# Patient Record
Sex: Female | Born: 1960
Health system: Southern US, Community
[De-identification: ages and names within clinical notes are randomized; demographics above are authoritative.]

## PROBLEM LIST (undated history)

## (undated) DIAGNOSIS — E785 Hyperlipidemia, unspecified: Secondary | ICD-10-CM

## (undated) DIAGNOSIS — I7300 Raynaud's syndrome without gangrene: Secondary | ICD-10-CM

## (undated) DIAGNOSIS — I10 Essential (primary) hypertension: Secondary | ICD-10-CM

## (undated) DIAGNOSIS — M2550 Pain in unspecified joint: Secondary | ICD-10-CM

## (undated) DIAGNOSIS — T7840XA Allergy, unspecified, initial encounter: Secondary | ICD-10-CM

## (undated) DIAGNOSIS — H409 Unspecified glaucoma: Secondary | ICD-10-CM

## (undated) DIAGNOSIS — E739 Lactose intolerance, unspecified: Secondary | ICD-10-CM

## (undated) DIAGNOSIS — L659 Nonscarring hair loss, unspecified: Secondary | ICD-10-CM

## (undated) DIAGNOSIS — Z5189 Encounter for other specified aftercare: Secondary | ICD-10-CM

## (undated) DIAGNOSIS — E119 Type 2 diabetes mellitus without complications: Secondary | ICD-10-CM

## (undated) DIAGNOSIS — E669 Obesity, unspecified: Secondary | ICD-10-CM

## (undated) DIAGNOSIS — R251 Tremor, unspecified: Secondary | ICD-10-CM

## (undated) DIAGNOSIS — J45909 Unspecified asthma, uncomplicated: Secondary | ICD-10-CM

## (undated) DIAGNOSIS — R609 Edema, unspecified: Secondary | ICD-10-CM

## (undated) DIAGNOSIS — K219 Gastro-esophageal reflux disease without esophagitis: Secondary | ICD-10-CM

## (undated) DIAGNOSIS — E559 Vitamin D deficiency, unspecified: Secondary | ICD-10-CM

## (undated) DIAGNOSIS — M549 Dorsalgia, unspecified: Secondary | ICD-10-CM

## (undated) HISTORY — DX: Type 2 diabetes mellitus without complications: E11.9

## (undated) HISTORY — DX: Raynaud's syndrome without gangrene: I73.00

## (undated) HISTORY — DX: Encounter for other specified aftercare: Z51.89

## (undated) HISTORY — DX: Pain in unspecified joint: M25.50

## (undated) HISTORY — DX: Vitamin D deficiency, unspecified: E55.9

## (undated) HISTORY — PX: ABDOMINAL HYSTERECTOMY: SHX81

## (undated) HISTORY — DX: Edema, unspecified: R60.9

## (undated) HISTORY — DX: Tremor, unspecified: R25.1

## (undated) HISTORY — DX: Lactose intolerance, unspecified: E73.9

## (undated) HISTORY — DX: Essential (primary) hypertension: I10

## (undated) HISTORY — PX: POLYPECTOMY: SHX149

## (undated) HISTORY — DX: Allergy, unspecified, initial encounter: T78.40XA

## (undated) HISTORY — DX: Hyperlipidemia, unspecified: E78.5

## (undated) HISTORY — DX: Obesity, unspecified: E66.9

## (undated) HISTORY — DX: Gastro-esophageal reflux disease without esophagitis: K21.9

## (undated) HISTORY — DX: Nonscarring hair loss, unspecified: L65.9

## (undated) HISTORY — PX: BREAST BIOPSY: SHX20

## (undated) HISTORY — DX: Unspecified glaucoma: H40.9

## (undated) HISTORY — PX: MYOMECTOMY: SHX85

## (undated) HISTORY — DX: Dorsalgia, unspecified: M54.9

## (undated) HISTORY — DX: Unspecified asthma, uncomplicated: J45.909

## (undated) HISTORY — PX: COLONOSCOPY: SHX174

---

## 1998-04-30 ENCOUNTER — Encounter: Admission: RE | Admit: 1998-04-30 | Discharge: 1998-04-30 | Payer: Self-pay | Admitting: *Deleted

## 1998-04-30 ENCOUNTER — Encounter: Payer: Self-pay | Admitting: Family Medicine

## 1998-06-30 ENCOUNTER — Ambulatory Visit (HOSPITAL_COMMUNITY): Admission: RE | Admit: 1998-06-30 | Discharge: 1998-06-30 | Payer: Self-pay | Admitting: *Deleted

## 1998-06-30 ENCOUNTER — Encounter: Payer: Self-pay | Admitting: *Deleted

## 2000-04-05 ENCOUNTER — Other Ambulatory Visit: Admission: RE | Admit: 2000-04-05 | Discharge: 2000-04-05 | Payer: Self-pay | Admitting: Obstetrics and Gynecology

## 2000-04-21 ENCOUNTER — Encounter: Payer: Self-pay | Admitting: Obstetrics and Gynecology

## 2000-04-21 ENCOUNTER — Ambulatory Visit (HOSPITAL_COMMUNITY): Admission: RE | Admit: 2000-04-21 | Discharge: 2000-04-21 | Payer: Self-pay | Admitting: Obstetrics and Gynecology

## 2001-05-08 ENCOUNTER — Ambulatory Visit (HOSPITAL_COMMUNITY): Admission: RE | Admit: 2001-05-08 | Discharge: 2001-05-08 | Payer: Self-pay | Admitting: Obstetrics and Gynecology

## 2001-05-08 ENCOUNTER — Encounter: Payer: Self-pay | Admitting: Obstetrics and Gynecology

## 2001-06-13 ENCOUNTER — Other Ambulatory Visit: Admission: RE | Admit: 2001-06-13 | Discharge: 2001-06-13 | Payer: Self-pay | Admitting: Obstetrics and Gynecology

## 2002-02-22 ENCOUNTER — Encounter: Payer: Self-pay | Admitting: Family Medicine

## 2002-02-22 ENCOUNTER — Ambulatory Visit (HOSPITAL_COMMUNITY): Admission: RE | Admit: 2002-02-22 | Discharge: 2002-02-22 | Payer: Self-pay | Admitting: Family Medicine

## 2002-06-26 ENCOUNTER — Encounter: Payer: Self-pay | Admitting: Obstetrics and Gynecology

## 2002-06-26 ENCOUNTER — Ambulatory Visit (HOSPITAL_COMMUNITY): Admission: RE | Admit: 2002-06-26 | Discharge: 2002-06-26 | Payer: Self-pay | Admitting: Obstetrics and Gynecology

## 2002-08-15 ENCOUNTER — Other Ambulatory Visit: Admission: RE | Admit: 2002-08-15 | Discharge: 2002-08-15 | Payer: Self-pay | Admitting: Obstetrics and Gynecology

## 2003-07-09 ENCOUNTER — Ambulatory Visit (HOSPITAL_COMMUNITY): Admission: RE | Admit: 2003-07-09 | Discharge: 2003-07-09 | Payer: Self-pay | Admitting: Obstetrics and Gynecology

## 2003-08-19 ENCOUNTER — Other Ambulatory Visit: Admission: RE | Admit: 2003-08-19 | Discharge: 2003-08-19 | Payer: Self-pay | Admitting: Obstetrics and Gynecology

## 2004-07-13 ENCOUNTER — Ambulatory Visit (HOSPITAL_COMMUNITY): Admission: RE | Admit: 2004-07-13 | Discharge: 2004-07-13 | Payer: Self-pay | Admitting: Obstetrics and Gynecology

## 2004-08-24 ENCOUNTER — Other Ambulatory Visit: Admission: RE | Admit: 2004-08-24 | Discharge: 2004-08-24 | Payer: Self-pay | Admitting: Obstetrics and Gynecology

## 2005-02-21 ENCOUNTER — Other Ambulatory Visit: Admission: RE | Admit: 2005-02-21 | Discharge: 2005-02-21 | Payer: Self-pay | Admitting: Obstetrics and Gynecology

## 2005-07-11 ENCOUNTER — Ambulatory Visit (HOSPITAL_COMMUNITY): Admission: RE | Admit: 2005-07-11 | Discharge: 2005-07-11 | Payer: Self-pay | Admitting: Obstetrics and Gynecology

## 2005-08-11 ENCOUNTER — Other Ambulatory Visit: Admission: RE | Admit: 2005-08-11 | Discharge: 2005-08-11 | Payer: Self-pay | Admitting: Obstetrics and Gynecology

## 2008-06-23 ENCOUNTER — Other Ambulatory Visit: Admission: RE | Admit: 2008-06-23 | Discharge: 2008-06-23 | Payer: Self-pay | Admitting: Family Medicine

## 2008-06-26 ENCOUNTER — Ambulatory Visit (HOSPITAL_COMMUNITY): Admission: RE | Admit: 2008-06-26 | Discharge: 2008-06-26 | Payer: Self-pay | Admitting: Obstetrics and Gynecology

## 2009-06-01 ENCOUNTER — Encounter: Admission: RE | Admit: 2009-06-01 | Discharge: 2009-08-30 | Payer: Self-pay | Admitting: Internal Medicine

## 2009-07-09 ENCOUNTER — Ambulatory Visit (HOSPITAL_COMMUNITY): Admission: RE | Admit: 2009-07-09 | Discharge: 2009-07-09 | Payer: Self-pay | Admitting: Obstetrics and Gynecology

## 2009-09-09 ENCOUNTER — Inpatient Hospital Stay (HOSPITAL_COMMUNITY): Admission: RE | Admit: 2009-09-09 | Discharge: 2009-09-11 | Payer: Self-pay | Admitting: Obstetrics and Gynecology

## 2009-09-09 ENCOUNTER — Encounter (INDEPENDENT_AMBULATORY_CARE_PROVIDER_SITE_OTHER): Payer: Self-pay | Admitting: Obstetrics and Gynecology

## 2009-12-11 ENCOUNTER — Encounter (INDEPENDENT_AMBULATORY_CARE_PROVIDER_SITE_OTHER): Payer: Self-pay | Admitting: *Deleted

## 2010-01-01 ENCOUNTER — Encounter (INDEPENDENT_AMBULATORY_CARE_PROVIDER_SITE_OTHER): Payer: Self-pay | Admitting: *Deleted

## 2010-01-04 ENCOUNTER — Ambulatory Visit: Payer: Self-pay | Admitting: Gastroenterology

## 2010-01-18 ENCOUNTER — Ambulatory Visit: Payer: Self-pay | Admitting: Gastroenterology

## 2010-01-19 ENCOUNTER — Encounter: Payer: Self-pay | Admitting: Gastroenterology

## 2010-03-02 NOTE — Letter (Signed)
Summary: [facility] Instructions  [facility]  [address] [place], [place] [address]   Phone: [phone]  Fax: [phone]       KYRAN WHITTIER    Jun 16, 1960    MRN: [id number]        Procedure Day Dorna Bloom:  Duanne Limerick  01/18/10     Arrival Time:  7:30AM     Procedure Time:  8:30AM     Location of Procedure:                    _ X_  [facility] (4th Floor)                      PREPARATION FOR COLONOSCOPY WITH MOVIPREP   Starting 5 days prior to your procedure 01/13/10 do not eat nuts, seeds, popcorn, corn, beans, peas,  salads, or any raw vegetables.  Do not take any fiber supplements (e.g. Metamucil, Citrucel, and Benefiber).  THE DAY BEFORE YOUR PROCEDURE         DATE: 01/17/10   DAY: SUNDAY  1.  Drink clear liquids the entire day-NO SOLID FOOD  2.  Do not drink anything colored red or purple.  Avoid juices with pulp.  No orange juice.  3.  Drink at least 64 oz. (8 glasses) of fluid/clear liquids during the day to prevent dehydration and help the prep work efficiently.  CLEAR LIQUIDS INCLUDE: Water Jello Ice Popsicles Tea (sugar ok, no milk/cream) Powdered fruit flavored drinks Coffee (sugar ok, no milk/cream) Gatorade Juice: apple, white grape, white cranberry  Lemonade Clear bullion, consomm, broth Carbonated beverages (any kind) Strained chicken noodle soup Hard Candy                             4.  In the morning, mix first dose of MoviPrep solution:    Empty 1 Pouch A and 1 Pouch B into the disposable container    Add lukewarm drinking water to the top line of the container. Mix to dissolve    Refrigerate (mixed solution should be used within 24 hrs)  5.  Begin drinking the prep at 5:00 p.m. The MoviPrep container is divided by 4 marks.   Every 15 minutes drink the solution down to the next mark (approximately 8 oz) until the full liter is complete.   6.  Follow completed prep with 16 oz of clear liquid of your choice  (Nothing red or purple).  Continue to drink clear liquids until bedtime.  7.  Before going to bed, mix second dose of MoviPrep solution:    Empty 1 Pouch A and 1 Pouch B into the disposable container    Add lukewarm drinking water to the top line of the container. Mix to dissolve    Refrigerate  THE DAY OF YOUR PROCEDURE      DATE: 01/18/10   DAY: MONDAY  Beginning at 3:30AM (5 hours before procedure):         1. Every 15 minutes, drink the solution down to the next mark (approx 8 oz) until the full liter is complete.  2. Follow completed prep with 16 oz. of clear liquid of your choice.    3. You may drink clear liquids until 6:30AM (2 HOURS BEFORE PROCEDURE).   MEDICATION INSTRUCTIONS  Unless otherwise instructed, you should take regular prescription medications with a small sip of water   as early as possible the morning  of your procedure.        OTHER INSTRUCTIONS  You will need a responsible adult at least 51 years of age to accompany you and drive you home.   This person must remain in the waiting room during your procedure.  Wear loose fitting clothing that is easily removed.  Leave jewelry and other valuables at home.  However, you may wish to bring a book to read or  an iPod/MP3 player to listen to music as you wait for your procedure to start.  Remove all body piercing jewelry and leave at home.  Total time from sign-in until discharge is approximately 2-3 hours.  You should go home directly after your procedure and rest.  You can resume normal activities the  day after your procedure.  The day of your procedure you should not:   Drive   Make legal decisions   Operate machinery   Drink alcohol   Return to work  You will receive specific instructions about eating, activities and medications before you leave.    The above instructions have been reviewed and explained to me by   Clide Cliff, RN______________________    I fully understand  and can verbalize these instructions _____________________________ Date _________

## 2010-03-02 NOTE — Letter (Signed)
Summary: Pre Visit Letter Revised  [facility]  [address] [place], [place] [address]   Phone: [phone]  Fax: [phone]        12/11/2009 MRN: [id number]    Veronica Russell [address] [place], [place]  [address]             Procedure Date:  01/18/10  Welcome to the Gastroenterology Division at [facility].    You are scheduled to see a nurse for your pre-procedure visit on 01/04/10 at 8:30 a.m. on the 3rd floor at [facility], [address]  We ask that you try to arrive at our office 15 minutes prior to your appointment time to allow for check-in.  Please take a minute to review the attached form.  If you answer "Yes" to one or more of the questions on the first page, we ask that you call the person listed at your earliest opportunity.  If you answer "No" to all of the questions, please complete the rest of the form and bring it to your appointment.    Your nurse visit will consist of discussing your medical and surgical history, your immediate family medical history, and your medications.   If you are unable to list all of your medications on the form, please bring the medication bottles to your appointment and we will list them.  We will need to be aware of both prescribed and over the counter drugs.  We will need to know exact dosage information as well.    Please be prepared to read and sign documents such as consent forms, a financial agreement, and acknowledgement forms.  If necessary, and with your consent, a friend or relative is welcome to sit-in on the nurse visit with you.  Please bring your insurance card so that we may make a copy of it.  If your insurance requires a referral to see a specialist, please bring your referral form from your primary care physician.  No co-pay is required for this nurse visit.     If you cannot keep your appointment, please call [phone] to cancel or reschedule prior to your appointment date.   This allows [name] the opportunity to schedule an appointment for another patient in need of care.    Thank you for choosing [facility] for your medical needs.  We appreciate the opportunity to care for you.  Please visit [name] at our website  to learn more about our practice.  Sincerely, The Gastroenterology Division

## 2010-03-02 NOTE — Miscellaneous (Signed)
Summary: D. COLON - LSW.  Clinical Lists Changes  Medications: Added new medication of MOVIPREP 100 GM  SOLR (PEG-KCL-NACL-NASULF-NA ASC-C) As per prep instructions. - Signed Rx of MOVIPREP 100 GM  SOLR (PEG-KCL-NACL-NASULF-NA ASC-C) As per prep instructions.;  #1 x 0;  Signed;  Entered by: Clide Cliff RN;  Authorized by: Mardella Layman MD [facility];  Method used: Electronically to [facility]*, [address], [address] Shipping/mailing, [place], [place]  [address], Ph: [phone], Fax: [phone] Observations: Added new observation of ALLERGY REV: Done (01/04/2010 8:08)    Prescriptions: MOVIPREP 100 GM  SOLR (PEG-KCL-NACL-NASULF-NA ASC-C) As per prep instructions.  #1 x 0   Entered by:   Clide Cliff RN   Authorized by:   Mardella Layman MD [facility]   Signed by:   Clide Cliff RN on 01/04/2010   Method used:   Electronically to        [facility]* (retail)       [address]       [address] Shipping/mailing       [place], [place]  [address]       Ph: [phone]       Fax: [phone]   RxID:   [id number]

## 2010-03-04 NOTE — Letter (Signed)
Summary: Patient Notice- Polyp Results  [facility]  [address] [place], [place] [address]   Phone: [phone]  Fax: [phone]        January 19, 2010 MRN: [id number]    Veronica Russell [address] [place], [place]  [address]    Dear Ms. Rengel,  I am pleased to inform you that the colon polyp(s) removed during your recent colonoscopy was (were) found to be benign (no cancer detected) upon pathologic examination.  I recommend you have a repeat colonoscopy examination in 5_ years to look for recurrent polyps, as having colon polyps increases your risk for having recurrent polyps or even colon cancer in the future.  Should you develop new or worsening symptoms of abdominal pain, bowel habit changes or bleeding from the rectum or bowels, please schedule an evaluation with either your primary care physician or with me.  Additional information/recommendations:  _x_ No further action with gastroenterology is needed at this time. Please      follow-up with your primary care physician for your other healthcare      needs.  __ Please call [phone] to schedule a return visit to review your      situation.  __ Please keep your follow-up visit as already scheduled.  __ Continue treatment plan as outlined the day of your exam.  Please call us if you are having persistent problems or have questions about your condition that have not been fully answered at this time.  Sincerely,  Mardella Layman MD [facility]  This letter has been electronically signed by your physician.  Appended Document: Patient Notice- Polyp Results Letter Mailed

## 2010-03-04 NOTE — Procedures (Signed)
Summary: Colonoscopy  Patient: Veronica Russell Note: All result statuses are Final unless otherwise noted.  Tests: (1) Colonoscopy (COL)   COL Colonoscopy           DONE      [facility]     [address]     [place], [place]  [address]           COLONOSCOPY PROCEDURE REPORT           PATIENT:  Veronica Russell, Veronica Russell  MR#:  [id number]     BIRTHDATE:  [birth year], 49 yrs. old  GENDER:  female     ENDOSCOPIST:  Vania Rea. Jarold Motto, MD, [facility]     REF. BY:  Deatra James, M.D.     PROCEDURE DATE:  01/18/2010     PROCEDURE:  Average-risk screening colonoscopy     G0121     ASA CLASS:  Class II     INDICATIONS:  Routine Risk Screening     MEDICATIONS:   Fentanyl 100 mcg IV, Versed 10 mg IV           DESCRIPTION OF PROCEDURE:   After the risks benefits and     alternatives of the procedure were thoroughly explained, informed     consent was obtained.  Digital rectal exam was performed and     revealed no abnormalities.   The LB 180AL [id number] endoscope was     introduced through the anus and advanced to the cecum, which was     identified by both the appendix and ileocecal valve, without     limitations.  The quality of the prep was excellent, using     MoviPrep.  The instrument was then slowly withdrawn as the colon     was fully examined.     <<PROCEDUREIMAGES>>           FINDINGS:  Moderate diverticulosis was found throughout the colon.     DIFFUSE TICS,,,  A sessile polyp was found. 1CM SIGMOID POLYP HOT     SNARE EXCISED  This was otherwise a normal examination of the     colon.   Retroflexed views in the rectum revealed no     abnormalities.    The scope was then withdrawn from the patient     and the procedure completed.           COMPLICATIONS:  None     ENDOSCOPIC IMPRESSION:     1) Moderate diverticulosis throughout the colon     2) Sessile polyp     3) Otherwise normal examination     R/O ADENOMA.     RECOMMENDATIONS:     1) high fiber diet     2) metamucil or  benefiber     3) Repeat Colonoscopy in 5 years.     REPEAT EXAM:  No           ______________________________     Vania Rea. Jarold Motto, MD, [facility]           CC:           n.     eSIGNED:   Vania Rea. Patterson at 01/18/2010 09:25 AM           Cleatrice Burke, [id number]  Note: An exclamation mark (!) indicates a result that was not dispersed into the flowsheet. Document Creation Date: 01/18/2010 9:25 AM _______________________________________________________________________  (1) Order result status: Final Collection or observation date-time: 01/18/2010 09:16 Requested date-time:  Receipt date-time:  Reported date-time:  Referring Physician:   Ordering Physician: Sheryn Bison [id number]) Specimen Source:  Source: Launa Grill Order Number: [id number] Lab site:   Appended Document: Colonoscopy 5 year followup  Appended Document: Colonoscopy 5 yr follow up     Procedures Next Due Date:    Colonoscopy: 01/2015

## 2010-04-16 LAB — BASIC METABOLIC PANEL
Chloride: 102 mEq/L (ref 96–112)
Creatinine, Ser: 0.63 mg/dL (ref 0.4–1.2)
Creatinine, Ser: 0.78 mg/dL (ref 0.4–1.2)
Potassium: 3.5 mEq/L (ref 3.5–5.1)
Sodium: 136 mEq/L (ref 135–145)

## 2010-04-16 LAB — PREGNANCY, URINE: Preg Test, Ur: NEGATIVE

## 2010-04-16 LAB — CBC
Hemoglobin: 11.3 g/dL — ABNORMAL LOW (ref 12.0–15.0)
MCH: 29 pg (ref 26.0–34.0)
MCHC: 33 g/dL (ref 30.0–36.0)
Platelets: 290 10*3/uL (ref 150–400)
RBC: 3.89 MIL/uL (ref 3.87–5.11)
RBC: 4.68 MIL/uL (ref 3.87–5.11)

## 2010-04-16 LAB — GLUCOSE, CAPILLARY: Glucose-Capillary: 142 mg/dL — ABNORMAL HIGH (ref 70–99)

## 2010-04-16 LAB — SURGICAL PCR SCREEN: Staphylococcus aureus: NEGATIVE

## 2010-05-27 ENCOUNTER — Other Ambulatory Visit (HOSPITAL_COMMUNITY): Payer: Self-pay | Admitting: Obstetrics and Gynecology

## 2010-05-27 DIAGNOSIS — Z1231 Encounter for screening mammogram for malignant neoplasm of breast: Secondary | ICD-10-CM

## 2010-07-13 ENCOUNTER — Ambulatory Visit (HOSPITAL_COMMUNITY): Payer: 59

## 2010-07-22 ENCOUNTER — Ambulatory Visit (HOSPITAL_COMMUNITY): Payer: 59

## 2010-07-29 ENCOUNTER — Ambulatory Visit (HOSPITAL_COMMUNITY)
Admission: RE | Admit: 2010-07-29 | Discharge: 2010-07-29 | Disposition: A | Discharge status: Home / Self Care | Payer: 59 | Source: Ambulatory Visit | Attending: Obstetrics and Gynecology | Admitting: Obstetrics and Gynecology

## 2010-07-29 DIAGNOSIS — Z1231 Encounter for screening mammogram for malignant neoplasm of breast: Secondary | ICD-10-CM | POA: Insufficient documentation

## 2011-07-06 ENCOUNTER — Other Ambulatory Visit: Payer: Self-pay | Admitting: Obstetrics and Gynecology

## 2011-07-06 DIAGNOSIS — Z1231 Encounter for screening mammogram for malignant neoplasm of breast: Secondary | ICD-10-CM

## 2011-08-08 ENCOUNTER — Encounter: Payer: Self-pay | Admitting: *Deleted

## 2011-08-08 ENCOUNTER — Encounter: Payer: 59 | Attending: Family Medicine | Admitting: *Deleted

## 2011-08-08 DIAGNOSIS — Z713 Dietary counseling and surveillance: Secondary | ICD-10-CM | POA: Insufficient documentation

## 2011-08-08 DIAGNOSIS — E669 Obesity, unspecified: Secondary | ICD-10-CM | POA: Insufficient documentation

## 2011-08-08 NOTE — Progress Notes (Signed)
  Medical Nutrition Therapy:  Appt start time: 1500 end time:  1600.   Assessment:  Primary concerns today: obesity.   MEDICATIONS: see list   DIETARY INTAKE:  Usual eating pattern includes 2-3 meals and 1-3 snacks per day.  Everyday foods include fast food, fatty, high sodium meats, some vegetables.  Avoided foods include dairy.   Work 8-5 or 8-230 on weekends 24-hr recall:  B ( AM): skips most of the time.  May get sausage patty or egg, bacon biscuit.  Drinks mostly water, may have cranberry juice  Snk ( AM): may have 100 calorie pak L ( PM): hamburger or hot dog in cafeteria sometime gets fries.  Drinks coke or [employer] ( PM): may have oatmeal cookie sometimes D ( PM): takeout fast food almost always  Snk ( PM): none Beverages: some sugar-beverages, mostly water  Usual physical activity: ADL  Estimated energy needs: 1400 calories 158 g carbohydrates 105 g protein 39 g fat  Progress Towards Goal(s):  In progress.   Nutritional Diagnosis:  [place]-3.3 Overweight/obesity As related to energy-dense food and beverage choices as well as physical inactivity.  As evidenced by BMI of 43.    Intervention:  Nutrition counseling provided.  Patient is here for weight management.  She is prediabetic, hypertensive, and hyperlipidemic.  She wants to make some healthier changes, but feels her lifestyle isn't conducive to traditional weight loss strategies like Weight Watchers.  She is also confused about how to manage her three health issues (blood pressure, blood sugar, and cholesterol).  Discussed carb counting and portion control of carbohydrates, proteins, and fats. Encourages more whole grains, fresh fruits, and vegetables.  Encouraged lean, low -sodium meats.  Discouraged processed pork products.  Encouraged 3 meals a day and to avoid meal skipping.  Encouraged bringing breakfast and lunch from home as to avoid unhealthy cafeteria choices.  Also encouraged more physical activity and non-sugary  beverages.    Handouts given during visit include:  Meal plan card  Monitoring/Evaluation:  Dietary intake, exercise, and body weight in 6 week(s).

## 2011-08-08 NOTE — Patient Instructions (Addendum)
Goals:  Eat 3 meals/day, Avoid meal skipping   Increase protein rich foods  Follow "Plate Method" for portion control  Limit carbohydrate1-2 servings/meal   Choose more whole grains, lean protein, low-fat dairy, and fruits/non-starchy vegetables.   Aim for >30 min of physical activity daily  Limit sugar-sweetened beverages and concentrated sweets   

## 2011-08-10 ENCOUNTER — Ambulatory Visit (HOSPITAL_COMMUNITY)
Admission: RE | Admit: 2011-08-10 | Discharge: 2011-08-10 | Disposition: A | Discharge status: Home / Self Care | Payer: 59 | Source: Ambulatory Visit | Attending: Obstetrics and Gynecology | Admitting: Obstetrics and Gynecology

## 2011-08-10 DIAGNOSIS — Z1231 Encounter for screening mammogram for malignant neoplasm of breast: Secondary | ICD-10-CM | POA: Insufficient documentation

## 2011-09-19 ENCOUNTER — Encounter: Payer: 59 | Attending: Family Medicine | Admitting: *Deleted

## 2011-09-19 ENCOUNTER — Encounter: Payer: Self-pay | Admitting: *Deleted

## 2011-09-19 VITALS — Wt 250.0 lb

## 2011-09-19 DIAGNOSIS — Z713 Dietary counseling and surveillance: Secondary | ICD-10-CM | POA: Insufficient documentation

## 2011-09-19 DIAGNOSIS — E669 Obesity, unspecified: Secondary | ICD-10-CM | POA: Insufficient documentation

## 2011-09-19 NOTE — Progress Notes (Signed)
  Medical Nutrition Therapy:  Appt start time: 1730 end time:  1800.   Assessment:  Primary concerns today: obesity follow up.   MEDICATIONS: see list   DIETARY INTAKE:  Usual eating pattern includes 3 meals and 0-1 snacks per day.  Everyday foods include lately been eating out every night.  Avoided foods include none.    24-hr recall:  B ( AM): limits juice to 4 oz and has breakfast every day  Snk ( AM): none  L ( PM): chicken salad; sometimes eat Snk ( PM): none D ( PM): eats out almost every day because of bday celebration Snk ( PM): none Beverages: more water or diluted tea  Usual physical activity: none  Estimated energy needs:  1400 calories  158 g carbohydrates  105 g protein  39 g fat   Progress Towards Goal(s): Some progress.   Nutritional Diagnosis:  [place]-3.3 Overweight/obesity As related to energy-dense food and beverage choices as well as physical inactivity. As evidenced by BMI of 43.     Intervention:  Nutrition  Counseling provided.  Shirely has made some progress towards being aware of her food choices.  She is eating breakfast daily and is drinking less sugary beverages.  She tries to make healthier lunch choices in the cafeteria.  She has been going out to eat every night lately due to her birthday celebration and she hasn't had time to exercise.  She's trying to eat less sweets.  She says that once her birthday celebrations are over she can refocus on diet and exercise.  Encouraged making healthier choices when eating out and bringing half entree home, increasing physical activity, and always choosing non-calorie beverage.     Monitoring/Evaluation:  Dietary intake, exercise,  and body weight in 1 month(s).

## 2011-09-19 NOTE — Patient Instructions (Signed)
Goals:  Eat 3 meals/day, Avoid meal skipping   Increase protein rich foods  Follow "Plate Method" for portion control  Limit carbohydrate1-2 servings/meal   Choose more whole grains, lean protein, low-fat dairy, and fruits/non-starchy vegetables.   Aim for >30 min of physical activity daily  Limit sugar-sweetened beverages and concentrated sweets   

## 2011-10-24 ENCOUNTER — Ambulatory Visit: Payer: 59 | Admitting: *Deleted

## 2011-11-02 ENCOUNTER — Encounter: Payer: 59 | Attending: Family Medicine | Admitting: *Deleted

## 2011-11-02 VITALS — Ht 64.0 in | Wt 249.7 lb

## 2011-11-02 DIAGNOSIS — Z713 Dietary counseling and surveillance: Secondary | ICD-10-CM | POA: Insufficient documentation

## 2011-11-02 DIAGNOSIS — R7309 Other abnormal glucose: Secondary | ICD-10-CM

## 2011-11-02 DIAGNOSIS — E669 Obesity, unspecified: Secondary | ICD-10-CM | POA: Insufficient documentation

## 2011-11-02 DIAGNOSIS — E782 Mixed hyperlipidemia: Secondary | ICD-10-CM

## 2011-11-02 NOTE — Progress Notes (Signed)
  Medical Nutrition Therapy:  Appt start time: 1600 end time:  1630.   Assessment:  Primary concerns today: obesity follow up.   MEDICATIONS: metformin   DIETARY INTAKE:  Usual eating pattern includes 3 meals and 1 snacks per day.  Everyday foods include proteins, vegetables, starchez.  Avoided foods include none.    24-hr recall:  B ( AM): low fat cheese sticks, boiled eggs, strip of bacon sometimes; 4 oz juice  Snk ( AM): peanut butter crackers L ( PM): chicken salad or 1/2 dinner leftovers from restaurant Snk ( PM): none D ( PM): goes out to eat 4 nights. Steak or fish with vegetables or salad- opts out of starch; baked spaghetti with cheese, chicken pie (with low fat soup) uses low sodium chicken broth Snk ( PM): none Beverages: more water and gingerale  Usual physical activity: none  Estimated energy needs: 1400 calories 158 g carbohydrates 105 g protein 39 g fat  Progress Towards Goal(s):  Some progress. She eats 1/2 portion when eating out.  Chooses low carb choices when eating out.  Cheese low carb breakfasts most days and low carb snacks.  She eats more vegetables and has cut back on her sugary beverages   Nutritional Diagnosis:  [place]-3.3 Overweight/obesity As related to energy-dense food and beverage choices as well as physical inactivity. As evidenced by BMI of 43    Intervention:  Nutrition counseling provided.  Davey has made some changes.  Praised her for her changes.  Encouraged her to keep up the good work.  Suggested cutting out sugary drinks altogether- trying diet soda or tea with Splenda.  Suggested water aerobics classes and choosing low fat food preparation methods.  Reviewed portion sizes.   Handouts given during visit include:  My pocket portion guide  Monitoring/Evaluation:  Dietary intake, exercise, and body weight in 3 month(s).

## 2012-02-06 ENCOUNTER — Encounter: Payer: 59 | Attending: Family Medicine | Admitting: *Deleted

## 2012-02-06 VITALS — Ht 64.0 in | Wt 245.3 lb

## 2012-02-06 DIAGNOSIS — R7309 Other abnormal glucose: Secondary | ICD-10-CM

## 2012-02-06 DIAGNOSIS — E669 Obesity, unspecified: Secondary | ICD-10-CM | POA: Insufficient documentation

## 2012-02-06 DIAGNOSIS — Z713 Dietary counseling and surveillance: Secondary | ICD-10-CM | POA: Insufficient documentation

## 2012-02-06 DIAGNOSIS — E785 Hyperlipidemia, unspecified: Secondary | ICD-10-CM

## 2012-02-06 NOTE — Patient Instructions (Addendum)
Continue excellent work with food!!!!! Aim to increase physical activity Refer to book about treatment for hypoglycemia and hyperglycemia- carry candies Focus on alternative to food when dealing with stress and emotions: reading or watch movie, get pampered- nails done, etc.

## 2012-02-06 NOTE — Progress Notes (Signed)
  Medical Nutrition Therapy:  Appt start time: 1730 end time:  1800.   Assessment:  Primary concerns today: abnormal glucose; elevated blood lipids, obesity.   MEDICATIONS: see list   DIETARY INTAKE:  Usual eating pattern includes 3 meals and 0-1 snacks per day.  Everyday foods include leaner proteins, starches, vegetables.  Avoided foods include red meats, sweets.    24-hr recall:  B ( AM): grapes and pineapple maybe 2 strips bacon. water  Snk ( AM): none usually  L ( PM): rotiserie chicken and vegetable. 1/2 tea Snk ( PM): none usually.  Maybe peanut butter crackers D ( PM): fish or chicken and vegetables.  Smaller portions of starch Snk ( PM): none usuaally Beverages: water, 1/2 tea  Usual physical activity: none  Estimated energy needs: 1600 calories 180 g carbohydrates 120 g protein 44 g fat  Progress Towards Goal(s):  Some progress.   Nutritional Diagnosis:  [place]-3.3 Overweight/obesity As related to inadequate physical activity and history of large portions of energy-dense foods and beverages.  As evidenced by BMI and associated comorbidities.    Intervention:  Nutrition Veronica Russell is here for a follow up appointment.  She has lost about 5 pounds, which is terrific.  She reports eating smaller portions, eating out less, and eating leaner proteins.  She is not physically active.  Veronica Russell reported elevated glucose and a resulting increase in metformin.  She now takes 500 mg BID instead of 1 time qd.  She reports better control.  She's been exhibiting some symptoms of hypoglycemia and suggested she keep hard candies in her purse.  Discussed treatment for hypoglycemia.  There have been some emotional events for her lately; she's concerned about potentially overeating.  Discussed alternatives to food when managing stress and emotions.  She likes to read and watch movies.  Encouraged increasing physical activity.    Handouts given during visit include:  Living well with  diabetes  Monitoring/Evaluation:  Dietary intake, exercise, BGM, and body weight in 3 month(s).

## 2012-03-17 ENCOUNTER — Other Ambulatory Visit: Payer: Self-pay

## 2012-04-09 DIAGNOSIS — L658 Other specified nonscarring hair loss: Secondary | ICD-10-CM | POA: Insufficient documentation

## 2012-05-07 ENCOUNTER — Encounter: Payer: 59 | Attending: Family Medicine | Admitting: *Deleted

## 2012-05-07 VITALS — Ht 64.0 in | Wt 239.0 lb

## 2012-05-07 DIAGNOSIS — Z713 Dietary counseling and surveillance: Secondary | ICD-10-CM | POA: Insufficient documentation

## 2012-05-07 DIAGNOSIS — E669 Obesity, unspecified: Secondary | ICD-10-CM

## 2012-05-07 DIAGNOSIS — E119 Type 2 diabetes mellitus without complications: Secondary | ICD-10-CM

## 2012-05-07 NOTE — Progress Notes (Signed)
  Medical Nutrition Therapy:  Appt start time: 1700 end time:  1730.   Assessment:  Primary concerns today: Veronica Russell is here for a follow up appointment for her diabetes.  She has lost about 11 pounds and her HbA1c dropped to 6.4%! She has made excellent progress so far.  She reports having multiple URIs and has been on antibiotics on and off for several months.  During her sickness, she had symptoms of hypoglycemia and reported knowing how to treat those symptoms based off of her visits to [facility].  She is limiting her portions, especially of carbohydrates, and she's having to park farther away now so she's getting more exercise.   MEDICATIONS: see list   DIETARY INTAKE:  Usual eating pattern includes 3 meals and 0-1 snacks per day.   Everyday foods include leaner proteins, starches, vegetables. Avoided foods include red meats, sweets   Usual physical activity: parks farther away.  Pedometer reveals about 5000 steps/day  Estimated energy needs:  1600 calories  180 g carbohydrates  120 g protein  44 g fat   Progress Towards Goal(s): Some progress.   Nutritional Diagnosis:  [place]-3.3 Overweight/obesity As related to inadequate physical activity and history of large portions of energy-dense foods and beverages. As evidenced by BMI and associated comorbidities.    Intervention:  Nutrition counseling provided.  Praised Veronica Russell for her successes!  Discussed hypoglycemia and sick day rules.  Recommended simple carbohydrates like jello, ginger ale, saltines, etc when she is sick and doesn't feel like eating much.  Encouraged her to check her glucose more often when sick due to potential hyperglycemia risk. Recommended increasing activity as possible.    Monitoring/Evaluation:  Dietary intake, exercise, BGM, and body weight in 3 month(s).

## 2012-07-17 ENCOUNTER — Other Ambulatory Visit: Payer: Self-pay | Admitting: Obstetrics and Gynecology

## 2012-07-17 DIAGNOSIS — Z1231 Encounter for screening mammogram for malignant neoplasm of breast: Secondary | ICD-10-CM

## 2012-08-06 ENCOUNTER — Encounter: Payer: Self-pay | Admitting: *Deleted

## 2012-08-06 ENCOUNTER — Encounter: Payer: 59 | Attending: Family Medicine | Admitting: *Deleted

## 2012-08-06 VITALS — Ht 64.0 in | Wt 242.3 lb

## 2012-08-06 DIAGNOSIS — E119 Type 2 diabetes mellitus without complications: Secondary | ICD-10-CM

## 2012-08-06 DIAGNOSIS — Z713 Dietary counseling and surveillance: Secondary | ICD-10-CM | POA: Insufficient documentation

## 2012-08-06 DIAGNOSIS — E669 Obesity, unspecified: Secondary | ICD-10-CM | POA: Insufficient documentation

## 2012-08-06 NOTE — Progress Notes (Signed)
  Medical Nutrition Therapy:  Appt start time: 1700 end time:  1730.  Assessment:  Primary concerns today: Veronica Russell is here for a follow up appointment for her diabetes.  She has made excellent progress so far.   She is limiting her portions, especially of carbohydrates, and she's having to park farther away now so she's getting more exercise.  She uses a pedometer and walks at least 5000 steps a day. Breakfast continues to be a challenge, but she usually eats something.  Her friends and family have been pressuring her to drink meal replacement shakes, but she didn't think they would be healthy for her and hasn't had any yet..  She eats a lot of fish and chicken.  She still continues to eat out most nights, but all the staff at the restaurants know to sub veggies for the starches and to box up half her food for her.  She's concerned about going to the beach next week and how she will continue to stay healthy while traveling.  Her metformin XR continues to make her feel nauseated so she takes it right before bed  MEDICATIONS: see list   DIETARY INTAKE:  Usual eating pattern includes 3 meals and 0-1 snacks per day.   Everyday foods include leaner proteins, starches, vegetables. Avoided foods include red meats, sweets   Usual physical activity: parks farther away.  Pedometer reveals about 5000 steps/day  Estimated energy needs:  1600 calories  180 g carbohydrates  120 g protein  44 g fat   Progress Towards Goal(s): Some progress.   Nutritional Diagnosis:  [place]-3.3 Overweight/obesity As related to inadequate physical activity and history of large portions of energy-dense foods and beverages. As evidenced by BMI and associated comorbidities.    Intervention:  Nutrition counseling provided.  Praised Nihira for her successes! Discussed healthy breakfast options and recommended protein with her fruit.  Recommended going to the fitness center at [facility].  Recommended checking her glucose after meals  as well as before just to make sure her post prandial glucose is WNL.  Recommended packing a cooler with healthy snacks when she goes to the beach/pool so that she can keep her glucose levels normal.  Discouraged shake use and applauded her for not using meal replacement shakes    Monitoring/Evaluation:  Dietary intake, exercise, BGM, and body weight in 3 month(s).

## 2012-08-21 ENCOUNTER — Ambulatory Visit (HOSPITAL_COMMUNITY)
Admission: RE | Admit: 2012-08-21 | Discharge: 2012-08-21 | Disposition: A | Discharge status: Home / Self Care | Payer: 59 | Source: Ambulatory Visit | Attending: Obstetrics and Gynecology | Admitting: Obstetrics and Gynecology

## 2012-08-21 DIAGNOSIS — Z1231 Encounter for screening mammogram for malignant neoplasm of breast: Secondary | ICD-10-CM | POA: Insufficient documentation

## 2012-12-06 ENCOUNTER — Other Ambulatory Visit: Payer: Self-pay

## 2013-07-31 ENCOUNTER — Other Ambulatory Visit (HOSPITAL_COMMUNITY): Payer: Self-pay | Admitting: Family Medicine

## 2013-07-31 DIAGNOSIS — Z1231 Encounter for screening mammogram for malignant neoplasm of breast: Secondary | ICD-10-CM

## 2013-08-27 ENCOUNTER — Ambulatory Visit (HOSPITAL_COMMUNITY)
Admission: RE | Admit: 2013-08-27 | Discharge: 2013-08-27 | Disposition: A | Discharge status: Home / Self Care | Payer: 59 | Source: Ambulatory Visit | Attending: Family Medicine | Admitting: Family Medicine

## 2013-08-27 DIAGNOSIS — Z1231 Encounter for screening mammogram for malignant neoplasm of breast: Secondary | ICD-10-CM | POA: Insufficient documentation

## 2013-12-24 ENCOUNTER — Encounter: Payer: 59 | Attending: Family Medicine | Admitting: *Deleted

## 2013-12-24 DIAGNOSIS — Z713 Dietary counseling and surveillance: Secondary | ICD-10-CM | POA: Diagnosis not present

## 2013-12-24 DIAGNOSIS — E119 Type 2 diabetes mellitus without complications: Secondary | ICD-10-CM | POA: Diagnosis present

## 2013-12-24 NOTE — Patient Instructions (Addendum)
Check glucose before before bed.  Goal is less than 140 If you are high before bed and high in morning, then you ate too much carb or fat If you are normal at bedtime, then you're going low at night and we need to adjust medication  Consider membership to [facility] for water aerobics Aim for 48 oz water/day  Aim to increase vegetables: beets, green beans, spinach Increase amount at breakfast

## 2013-12-24 NOTE — Progress Notes (Signed)
Appointment start time: 1730  Appointment end time: 1800  Assessment: Veronica Russell is here for follow up nutrition counseling pertaining to diabetes.  I have not seen her since July 2014.  She states she needs a little check-up and accountability.  Fasting glucose has been a little higher than her normal:  It's been closer to 130 mg/dl.  Her postprandial glucose is always WNL.  Had urinalysis recently and has proteinuria.  Complains of heartburn and doesn't want to take anything over the counter   24 hour recall: B: 2 strips bacon and some eggs and pecans L: [place] and stuffing D: [employer] fried chicken strips and fries and cookie S: raspberry sorbet Beverages: water, small cup soda, 1/2 tea  Exercise: 5000 steps/day per phone app.  Has been struggling with tendonitis, but she's ok now. Has been told to do water exercises, not land exercise  Nutrition Diagnosis: Impaired nutrient utilization as related to carbohydrates.  As evidenced by diabetes.    Intervention:  Reviewed MyPlate recommendations for meal planning with diabetes: increase non-starchy vegetables and still have adequate protein and carbohydrates.  Recommended increasing physical activity through water aerobics.  Discussed possible reasons for high morning glucose readings: reactive hyperglycemia during the night after falling glucose, or excessive fat or carb consumption at night.  Instructed her to monitor her glucose before bed to better determine cause.  Goals: Check glucose before before bed.  Goal is less than 140 If you are high before bed and high in morning, then you ate too much carb or fat If you are normal at bedtime, then you're going low at night and we need to adjust medication  Consider membership to [facility] for water aerobics Aim for 48 oz water/day  Aim to increase vegetables: beets, green beans, spinach Increase amount at breakfast  Handouts given: Myplate for diabetes Meal plan card  Monitoring: patient will  follow up in 2 months

## 2014-02-20 ENCOUNTER — Telehealth: Payer: 59 | Admitting: Physician Assistant

## 2014-02-20 DIAGNOSIS — B349 Viral infection, unspecified: Secondary | ICD-10-CM

## 2014-02-20 DIAGNOSIS — R0602 Shortness of breath: Secondary | ICD-10-CM

## 2014-02-20 NOTE — Progress Notes (Signed)
Based on what you shared with me it looks like you have a serious condition that should be evaluated in a face to face office visit.  You have some symptoms that most likely represent a viral illness, not necessarily the flu, as it is usually associated with fever, chills and aches.  Your symptoms seem more consistent with a viral sinusitis.  Make sure to stay well-hydrated.  Since you are endorsing shortness of breath, our protocol is to advise you to be seen at one of the facilities below as you need a physical examination to assess lung status and oxygen saturation.  If you are having a true medical emergency please call 911.  If you need an urgent face to face visit, [facility] has four urgent care centers for your convenience.  . [facility] a Provider at this Location  [address] [place], [place] [address] . 8 am to 8 pm Monday-Friday . 9 am to 7 pm Saturday-Sunday  . [facility] [place] a Provider at this Location  [place] [place], [place], [place] [address] . 8 am to 8 pm Monday-Friday . 9 am to 6 pm Saturday . 11 am to 6 pm Sunday   . [facility] [place] Get Driving Directions  [address], [place] [address] . 8 am to 8 pm Monday-Friday . 9 am to 4 pm Saturday-Sunday   . [facility] (a walk in primary care provider)  [place] a Provider at this Location  [place], [place] [address] . 8 am to 8:30 pm Monday-Thursday . 8 am to 6 pm Friday . 8 am to 4 pm Saturday-Sunday   Your e-visit answers were reviewed by a board certified advanced clinical practitioner to complete your personal care plan.  Depending on the condition, your plan could have included both over the counter or prescription  medications.  You will get an e-mail in the next two days asking about your experience.  I hope that your e-visit has been valuable and will speed your recovery . Thank you for choosing an e-visit.

## 2014-06-17 ENCOUNTER — Encounter: Payer: Self-pay | Admitting: *Deleted

## 2014-06-26 ENCOUNTER — Ambulatory Visit: Payer: 59 | Admitting: *Deleted

## 2014-07-02 ENCOUNTER — Other Ambulatory Visit: Payer: Self-pay | Admitting: *Deleted

## 2014-07-03 ENCOUNTER — Encounter: Payer: Self-pay | Admitting: *Deleted

## 2014-07-04 ENCOUNTER — Other Ambulatory Visit (HOSPITAL_COMMUNITY): Payer: Self-pay | Admitting: Family Medicine

## 2014-07-04 DIAGNOSIS — Z1239 Encounter for other screening for malignant neoplasm of breast: Secondary | ICD-10-CM

## 2014-07-04 NOTE — Patient Outreach (Signed)
[facility]) Care Management   07/02/14  Veronica Russell 1960/06/06 [id number]  Veronica Russell is an 54 y.o. female who presents for routine Link To Wellness follow up for self management assistance with Type 2 DM, HTN and hyperlipidemia.  Subjective:  Veronica Russell states she was accepted into the 12 week [employer] Digital Care Study for people with Type II DM and she is very pleased with the study. She says she checks her blood sugars twice daily and the numbers are sent digitally to the support staff. She reports no episodes of hypoglycemia and states her hypoglycemia threshold is a blood sugar of <70. She says in addition to receiving a free digital scale, glucometer and pulse oximetry and fitness tracker, she is also receiving her meals and snacks at no cost from [employer]. She says the food is very helpful in assisting her with adherence to a CHO controlled meal plan. She says she has lost about 5 lbs and feels more energetic. Says she saw Dr. Radene Ou on 03/12/14 and will return on 09/15/14 for her annual wellness visit. She is voicing concern that her Mom's multiple myeloma may have returned because some recent blood work came back abnormal. Says she will take her Mom to see her oncologist on 6/9.  Objective:   Review of Systems  Constitutional: Negative.     Physical Exam  Constitutional: She is oriented to person, place, and time. She appears well-developed and well-nourished.  Neurological: She is alert and oriented to person, place, and time.  Skin: Skin is warm and dry.  Psychiatric: She has a normal mood and affect. Her behavior is normal. Judgment and thought content normal.   Filed Weights   07/02/14 1701  Weight: 248 lb 6.4 oz (112.674 kg)   Filed Vitals:   07/02/14 1701  BP: 104/82    Current Medications:   Current Outpatient Prescriptions  Medication Sig Dispense Refill  . amLODipine (NORVASC) 5 MG tablet Take 5 mg by mouth daily.    Marland Kitchen  atorvastatin (LIPITOR) 20 MG tablet Take 20 mg by mouth daily.    Marland Kitchen lisinopril-hydrochlorothiazide (PRINZIDE,ZESTORETIC) 20-12.5 MG per tablet Take 1 tablet by mouth 2 (two) times daily.     . metFORMIN (GLUCOPHAGE-XR) 500 MG 24 hr tablet Take 500 mg by mouth 2 (two) times daily.    . propranolol (INDERAL) 20 MG tablet Take 20 mg by mouth 2 (two) times daily.     . Vitamin D, Ergocalciferol, (DRISDOL) 50000 UNITS CAPS Take 50,000 Units by mouth.     No current facility-administered medications for this visit.    Functional Status:   In your present state of health, do you have any difficulty performing the following activities: 07/02/2014  Hearing? N  Vision? N  Difficulty concentrating or making decisions? N  Walking or climbing stairs? N  Dressing or bathing? N  Doing errands, shopping? N    Fall/Depression Screening:    PHQ 2/9 Scores 07/02/2014  PHQ - 2 Score 0   THN CM Care Plan Problem One        Patient Outreach from 07/02/2014 in [place] Problem One  Good control of Type II DM, HTN and Hyperlipidemia as evidenced by meeting treatment targets for A1C, BP and lipid profile prameters   Care Plan for Problem One  Active   THN Long Term Goal (31-90 days)  Continued good control of Type II DM, HTN and Hyperlipidemia as evidenced by meeting  treatment targets of  A1C<7.0%, BP<140/<90 and normal lipid profile at next check   [facility] Long Term Goal Start Date  07/02/14   Interventions for Problem One Long Term Goal  Reviewed stategies for ongoing good control of DM, HTN and hyperlipidemia by continued participation in the [employer] care initiative for people with Type II DM which includes adherence to meal plan by consuming only food provided by the study and blood glucose and weight monitoring per study protocol,  reviewed upcoming appointments for mammogram, eye exam adn reinforced the importance of keeping the appointments, arranged for Link To Wellness follow  up in September     Assessment:   Link To Wellness member with good control of Type II DM, HTN, and hyperlipidemia as evidenced by meeting all disease state treatment targets  Plan:  RNCM to fax today's office visit note to Dr. Radene Ou. RNCM will meet quarterly and as needed with patient per Link To Wellness program guidelines to assist with chronic disease self-management and assess patient's progress toward mutually set goals.  Barrington Ellison RN,CCM,CDE [place] Management Coordinator Link To Wellness Office Phone [phone] Office Fax [phone]

## 2014-09-02 ENCOUNTER — Ambulatory Visit (HOSPITAL_COMMUNITY)
Admission: RE | Admit: 2014-09-02 | Discharge: 2014-09-02 | Disposition: A | Discharge status: Home / Self Care | Payer: 59 | Source: Ambulatory Visit | Attending: Family Medicine | Admitting: Family Medicine

## 2014-09-02 DIAGNOSIS — Z1231 Encounter for screening mammogram for malignant neoplasm of breast: Secondary | ICD-10-CM | POA: Insufficient documentation

## 2014-09-02 DIAGNOSIS — Z1239 Encounter for other screening for malignant neoplasm of breast: Secondary | ICD-10-CM

## 2014-10-08 ENCOUNTER — Other Ambulatory Visit: Payer: Self-pay | Admitting: *Deleted

## 2014-10-13 NOTE — Patient Outreach (Signed)
[facility]) Care Management   10/13/2014  Veronica Russell 06-26-1960 [id number]  Veronica Russell is an 54 y.o. female who presents to the [facility] CM office to enroll in the Link To Wellness program for self management assistance with Type II DM   Subjective:  Veronica Russell says she saw Dr. Radene Ou on 09/15/14 and received a good report. She says her BP, lipid profile, and CMET were normal and her A1C was 6.5%. She says she will return to see Dr. Radene Ou In February. Veronica Russell says she is now paying privately to work out with a Physiological scientist, Veronica Russell, for 30 minutes twice weekly. She says is has been harder to follow a CHO controlled meal plan now that the [facility] study is over and she no longer receives the food through the program. She says she is attending the Weigh To Wellness classes taught by Dr. Boyce Medici at [facility] at no cost as a nutritional counseling benefit under the [place]. She says she picked up a True Metrix meter from the outpatient pharmacy since she can no longer get the strips for the meter she was using during the [employer] study. She says her Mom is doing well and remains in remission.   Objective:   Review of Systems  Constitutional: Negative.     Physical Exam  Constitutional: She is oriented to person, place, and time. She appears well-developed and well-nourished.  Neurological: She is alert and oriented to person, place, and time.  Skin: Skin is warm and dry.  Psychiatric: She has a normal mood and affect. Her behavior is normal. Judgment and thought content normal.   Filed Vitals:   10/08/14 1720  BP: 102/78   Filed Weights   10/08/14 1720  Weight: 252 lb (114.306 kg)    Current Medications:   Current Outpatient Prescriptions  Medication Sig Dispense Refill  . amLODipine (NORVASC) 5 MG tablet Take 5 mg by mouth daily.    Marland Kitchen atorvastatin (LIPITOR) 20 MG tablet Take 20 mg by mouth daily.    Marland Kitchen  lisinopril-hydrochlorothiazide (PRINZIDE,ZESTORETIC) 20-12.5 MG per tablet Take 1 tablet by mouth 2 (two) times daily.     . Melatonin 3 MG TABS Take 1 tablet by mouth at bedtime as needed.    . metFORMIN (GLUCOPHAGE-XR) 500 MG 24 hr tablet Take 500 mg by mouth 2 (two) times daily.    . propranolol (INDERAL) 20 MG tablet Take 20 mg by mouth 2 (two) times daily.     . Vitamin D, Ergocalciferol, (DRISDOL) 50000 UNITS CAPS Take 50,000 Units by mouth.      Assessment:   [employer] [place] employee and Link To Wellness member with ongoing good control of Type II DM, HTN and hyperlipidemia as evidenced by   Plan:  THN CM Care Plan Problem One        Most Recent Value   Care Plan Problem One  Good control of Type II DM, HTN and Hyperlipidemia as evidenced by meeting treatment targets for A1C, BP and lipid profile parameters    Role Documenting the Problem One  Care Management Coordinator   Care Plan for Problem One  Active   THN Long Term Goal (31-90 days)  Continued good control of Type II DM, HTN and Hyperlipidemia as evidenced by meeting treatment targets of  A1C<7.0%, BP<140/<90 and normal lipid profile at next check   [facility] Long Term Goal Start Date  07/02/14   Interventions for Problem One Long Term Goal  Reviewed the mechanism of action of Metformin, congratulated Veronica Russell on continuing an exercise program by meeting with a personal trainer for 30 minutes 2 x weekly and reviewed how exercise increases insulin sensitivity, also congratulated her for enrolling in the Weight To Wellness class series, reviewed strategies for ongoing good control of DM, HTN and hyperlipidemia including consistent medication adherence and adherence to a CHO controlled meal plan, continuing to monitor CBG daily and as needed, and keeping follow up appointments with primary and speciality providers, arranged for Link To Wellness follow up in December     This RNCM will send today's office visit note to patient's primary care  provider. This RNCM will continue to meet with patient at least quarterly and as needed per Link To Wellness program guidelines to assist with Type II DM self management and assess patient's progress toward mutually set goals.   Barrington Ellison RN,CCM,CDE [place] Management Coordinator Link To Wellness Office Phone [phone] Office Fax [phone]

## 2014-12-22 ENCOUNTER — Ambulatory Visit: Payer: 59 | Attending: Physician Assistant | Admitting: Physical Therapy

## 2014-12-22 DIAGNOSIS — R269 Unspecified abnormalities of gait and mobility: Secondary | ICD-10-CM | POA: Diagnosis present

## 2014-12-22 DIAGNOSIS — R29898 Other symptoms and signs involving the musculoskeletal system: Secondary | ICD-10-CM | POA: Insufficient documentation

## 2014-12-22 DIAGNOSIS — M25561 Pain in right knee: Secondary | ICD-10-CM | POA: Diagnosis present

## 2014-12-22 DIAGNOSIS — M25562 Pain in left knee: Secondary | ICD-10-CM | POA: Diagnosis present

## 2014-12-22 NOTE — Patient Instructions (Signed)
   Rustyn Conery PT, DPT, LAT, ATC  [facility] Phone: [phone]     

## 2014-12-22 NOTE — Therapy (Signed)
[place], [place], [address] Phone: [phone]   Fax:  [phone]  Physical Therapy Evaluation  Patient Details  Name: Veronica Russell MRN: [id number] Date of Birth: [birth year] Referring Provider: Narda Amber PA-C  Encounter Date: 12/22/2014      PT End of Session - 12/22/14 1709    Visit Number 1   Number of Visits 12   Date for PT Re-Evaluation 02/02/15   PT Start Time [id number]   PT Stop Time 1630   PT Time Calculation (min) 45 min   Activity Tolerance Patient tolerated treatment well   Behavior During Therapy [facility] for tasks assessed/performed      Past Medical History  Diagnosis Date  . Hyperlipidemia   . Hypertension   . Obesity     Past Surgical History  Procedure Laterality Date  . Abdominal hysterectomy    . Myomectomy      There were no vitals filed for this visit.  Visit Diagnosis:  Bilateral knee pain - Plan: PT plan of care cert/re-cert  Abnormality of gait - Plan: PT plan of care cert/re-cert  Left leg weakness - Plan: PT plan of care cert/re-cert      Subjective Assessment - 12/22/14 1553    Subjective pt is a 54 y.o F with CC of bil knee pain with L>R that has been getting worse since June with non-traumatic onset. pt reports it started when she started at gym and has a Physiological scientist and she has gradually gotten worse since it started. She states the pain is in the hips and knees.    How long can you sit comfortably? unlimited   How long can you stand comfortably? 1 hour   How long can you walk comfortably? 1 hour   Diagnostic tests x-ray last week, per pt report the R knee has a patellar tilt   Patient Stated Goals to be pain free   Currently in Pain? Yes   Pain Score 0-No pain  with walking/standing 5/1   Pain Location Knee   Pain Orientation Right;Left  L>R   Pain Descriptors / Indicators Burning;Sharp   Pain Type Chronic pain   Pain Onset More than a month  ago   Pain Frequency Intermittent   Aggravating Factors  stairs, prolonged standing/ walking, bending the knee   Pain Relieving Factors spray linoment on it, ice,             [facility] PT Assessment - 12/22/14 1600    Assessment   Medical Diagnosis bil knee chondromalacia   Referring Provider Narda Amber PA-C   Onset Date/Surgical Date --  June 2016   Hand Dominance Right   Next MD Visit 01/27/2015   Prior Therapy no   Precautions   Precautions Other (comment)   Precaution Comments stop going to personal trainer   Restrictions   Weight Bearing Restrictions No   Balance Screen   Has the patient fallen in the past 6 months No   Has the patient had a decrease in activity level because of a fear of falling?  No   Is the patient reluctant to leave their home because of a fear of falling?  No   Home Environment   Living Environment Private residence   Living Arrangements Alone   Type of [place] Access Level entry   Home Layout One level   Prior Function   Level of Independence Independent;Independent with basic ADLs   Vocation  Full time employment  surgery scheduling   Vocation Requirements prolonged sitting   Leisure watching TV   Cognition   Overall Cognitive Status Within Functional Limits for tasks assessed   Observation/Other Assessments   Focus on Therapeutic Outcomes (FOTO)  45% limited  Predicated 50% limite   Posture/Postural Control   Posture/Postural Control Postural limitations   Postural Limitations Rounded Shoulders;Forward head   ROM / Strength   AROM / PROM / Strength AROM;PROM;Strength   AROM   AROM Assessment Site Knee   Right/Left Knee Right;Left   Right Knee Extension 0   Right Knee Flexion 128   Left Knee Extension -4   Left Knee Flexion 123   PROM   PROM Assessment Site Knee   Right/Left Knee Right;Left   Left Knee Extension -2   Left Knee Flexion 135  pain at endrange of motion   Strength   Strength Assessment Site Knee;Hip    Right/Left Hip Right;Left   Right Hip Flexion 4/5   Right Hip Extension 3+/5   Right Hip ABduction 4-/5   Right Hip ADduction 5/5   Left Hip Flexion 4+/5   Left Hip Extension 3+/5   Left Hip ABduction 3+/5   Left Hip ADduction 5/5   Right/Left Knee Right;Left   Right Knee Flexion 4+/5   Right Knee Extension 4+/5   Left Knee Flexion 5/5   Left Knee Extension 4/5  pain during testing   Palpation   Patella mobility hypomobility bil with L>R with increased tightness toward the medial aspect   Palpation comment tendnerness locatred in the medial and lateral apsect of the L patella, with bil patellar tilts    Special Tests    Special Tests Knee Special Tests   Knee Special tests  Lateral Pull Sign;Step-up/Step Down Test;Patellofemoral Grind Test (Clarke's Sign);Patellofemoral Apprehension Test   Lateral Pull Sign    Findings Positive   Side Left   Patellofemoral Apprehension Test    Findings Positive   Side  Left   Patellofemoral Grind test (Clark's Sign)   Findings Postive   Side  --  bil   Ambulation/Gait   Gait Pattern Step-through pattern;Decreased stride length;Antalgic;Decreased trunk rotation                   [facility] Adult PT Treatment/Exercise - 12/22/14 1600    Manual Therapy   Manual Therapy Taping   McConnell Lateral to medial  pt reported relief of pain                PT Education - 12/22/14 1709    Education provided Yes   Education Details evaluation findings, POC, goals, HEP   Person(s) Educated Patient   Methods Explanation   Comprehension Verbalized understanding          PT Short Term Goals - 12/22/14 1716    PT SHORT TERM GOAL #1   Title pt will be I with inital HEP ( 01/12/2015)   Time 3   Period Weeks   Status New   PT SHORT TERM GOAL #2   Title pt will be able to verbalize and demonstrate techniques to contro bil knee pain and swelling via RICE, and HEP ( 01/12/2015)   Time 6   Period Weeks   Status New            PT Long Term Goals - 12/22/14 1717    PT LONG TERM GOAL #1   Title pt will be I with all HEP as of last  visit ( 02/02/2015)   Time 6   Period Weeks   Status New   PT LONG TERM GOAL #2   Title pt will increase R knee extension to >/=4+/5 with </= 3/10 pain to assist with prolonged standing/ walking (02/02/2015)   Time 6   Period Weeks   Status New   PT LONG TERM GOAL #3   Title pt will increase her L abduction and bil hip extension strength to >/=4/5 to assist with decreased medial collapse of the knee during step downs (02/02/2015)    Time 6   Period Weeks   Status New   PT LONG TERM GOAL #4   Title pt will be able to navigate >/=10 stairs with </= 3/10 pain to asisst with ADLs and community ambulation (02/02/2015)   Time 6   Period Weeks   Status New   PT LONG TERM GOAL #5   Title pt will increase her FOTO score to >/= 50 to demonstrate improved function at discharge (02/02/2015)   Time [place] - 12/22/14 1709    Clinical Impression Statement Veronica Russell presents to OPPT with CC of bil knee pain with L>R. she exhibits milld AROM limitation compared bil. MMT for the L knee was 4-/5 for extension with pain during testing, and 4+/5 with knee flexion. Hip strength was [facility] compared bil except for L abduction at 3+/5 during testing.  palpation revealed bil patellar tilt with the L>R and pain in the medial and lateral components of the L patella as well as tightness of the Vastus lateralis. . She demonstrated postivie apphrehension, step down testing with pain and medial collapse, and clarks sign. perfofmed McConnell tape on L knee and she reported relief of symptoms. pt would benefit from physical therapy to decrease pain and maximize function by addressing the impairments listed.    Pt will benefit from skilled therapeutic intervention in order to improve on the following deficits Abnormal gait;Postural dysfunction;Improper body mechanics;Pain;Decreased  endurance;Decreased activity tolerance;Decreased strength;Increased edema;Decreased range of motion;Hypomobility   Rehab Potential Good   PT Frequency 2x / week   PT Duration 6 weeks   PT Treatment/Interventions ADLs/Self Care Home Management;Cryotherapy;Electrical Stimulation;Iontophoresis 4mg /ml Dexamethasone;Moist Heat;Therapeutic exercise;Therapeutic activities;Manual techniques;Taping;Dry needling;Passive range of motion;Patient/family education;Ultrasound;Vasopneumatic Device;Balance training   PT Next Visit Plan assess response to HEP, Manual for vastus lateralis, VMO strengthening, modalities PRN, McConnel tape if it helped.    PT Home Exercise Plan SLR with ER, hip flexor stretching, standing extension/abduction.    Consulted and Agree with Plan of Care Patient         Problem List There are no active problems to display for this patient.  [name] PT, DPT, LAT, ATC  12/22/2014  5:32 PM     [facility] [address] [place], [place], [address] Phone: [phone]   Fax:  [phone]  Name: Veronica Russell MRN: [id number] Date of Birth: March 25, 1960

## 2014-12-24 ENCOUNTER — Ambulatory Visit: Payer: 59 | Admitting: Physical Therapy

## 2014-12-24 DIAGNOSIS — R29898 Other symptoms and signs involving the musculoskeletal system: Secondary | ICD-10-CM

## 2014-12-24 DIAGNOSIS — M25561 Pain in right knee: Secondary | ICD-10-CM | POA: Diagnosis not present

## 2014-12-24 DIAGNOSIS — R269 Unspecified abnormalities of gait and mobility: Secondary | ICD-10-CM

## 2014-12-24 DIAGNOSIS — M25562 Pain in left knee: Principal | ICD-10-CM

## 2014-12-24 NOTE — Therapy (Addendum)
[place], [place], [address] Phone: [phone]   Fax:  [phone]  Physical Therapy Treatment  Patient Details  Name: Veronica Russell MRN: [id number] Date of Birth: [birth year] Referring Provider: Narda Amber PA-C  Encounter Date: 12/24/2014    Past Medical History  Diagnosis Date  . Hyperlipidemia   . Hypertension   . Obesity     Past Surgical History  Procedure Laterality Date  . Abdominal hysterectomy    . Myomectomy      There were no vitals filed for this visit.  Visit Diagnosis:  Bilateral knee pain  Abnormality of gait  Left leg weakness                                 PT Short Term Goals - 12/22/14 1716    PT SHORT TERM GOAL #1   Title pt will be I with inital HEP ( 01/12/2015)   Time 3   Period Weeks   Status New   PT SHORT TERM GOAL #2   Title pt will be able to verbalize and demonstrate techniques to contro bil knee pain and swelling via RICE, and HEP ( 01/12/2015)   Time 6   Period Weeks   Status New           PT Long Term Goals - 12/22/14 1717    PT LONG TERM GOAL #1   Title pt will be I with all HEP as of last visit ( 02/02/2015)   Time 6   Period Weeks   Status New   PT LONG TERM GOAL #2   Title pt will increase R knee extension to >/=4+/5 with </= 3/10 pain to assist with prolonged standing/ walking (02/02/2015)   Time 6   Period Weeks   Status New   PT LONG TERM GOAL #3   Title pt will increase her L abduction and bil hip extension strength to >/=4/5 to assist with decreased medial collapse of the knee during step downs (02/02/2015)    Time 6   Period Weeks   Status New   PT LONG TERM GOAL #4   Title pt will be able to navigate >/=10 stairs with </= 3/10 pain to asisst with ADLs and community ambulation (02/02/2015)   Time 6   Period Weeks   Status New   PT LONG TERM GOAL #5   Title pt will increase her FOTO score to >/= 50 to  demonstrate improved function at discharge (02/02/2015)   Time 6   Period Weeks   Status New               Problem List There are no active problems to display for this patient.  [name] PT, DPT, LAT, ATC  12/29/2014  12:30 PM     [facility] [address] [place], [place], [address] Phone: [phone]   Fax:  [phone]  Name: Veronica Russell MRN: [id number] Date of Birth: 1960/10/20

## 2014-12-31 ENCOUNTER — Ambulatory Visit: Payer: 59 | Admitting: Physical Therapy

## 2014-12-31 DIAGNOSIS — M25561 Pain in right knee: Secondary | ICD-10-CM

## 2014-12-31 DIAGNOSIS — R29898 Other symptoms and signs involving the musculoskeletal system: Secondary | ICD-10-CM

## 2014-12-31 DIAGNOSIS — M25562 Pain in left knee: Principal | ICD-10-CM

## 2014-12-31 DIAGNOSIS — R269 Unspecified abnormalities of gait and mobility: Secondary | ICD-10-CM

## 2014-12-31 NOTE — Therapy (Signed)
[place], [place], [address] Phone: [phone]   Fax:  [phone]  Physical Therapy Treatment  Patient Details  Name: Veronica Russell MRN: [id number] Date of Birth: 03-01-1960 Referring Provider: Narda Amber PA-C  Encounter Date: 12/31/2014      PT End of Session - 12/31/14 0843    Visit Number 3   Number of Visits 12   Date for PT Re-Evaluation 02/02/15   PT Start Time 0759   PT Stop Time 0843   PT Time Calculation (min) 44 min   Activity Tolerance Patient tolerated treatment well   Behavior During Therapy [facility] for tasks assessed/performed      Past Medical History  Diagnosis Date  . Hyperlipidemia   . Hypertension   . Obesity     Past Surgical History  Procedure Laterality Date  . Abdominal hysterectomy    . Myomectomy      There were no vitals filed for this visit.  Visit Diagnosis:  Bilateral knee pain  Abnormality of gait  Left leg weakness      Subjective Assessment - 12/31/14 0758    Subjective It's getting better, still popping and cracking. Muscle sore after last session but was good.    Pain Score 1    Pain Location Knee   Pain Orientation Left   Pain Descriptors / Indicators Dull   Aggravating Factors  sitting for long periods, gets stiff   Pain Relieving Factors exercises seem to loosen it up.                          [place] Adult PT Treatment/Exercise - 12/31/14 0001    Knee/Hip Exercises: Stretches   Hip Flexor Stretch 2 reps;30 seconds   Knee/Hip Exercises: Aerobic   Nustep L5 x 8 min   Knee/Hip Exercises: Machines for Strengthening   Cybex Knee Extension 2 x 12 with 1 plate   Knee/Hip Exercises: Supine   Straight Leg Raises Strengthening;Left;10 reps   Knee/Hip Exercises: Sidelying   Hip ABduction Left;1 set;10 reps   Clams 1 X 10 (lying on Rt side)   Manual Therapy   Manual Therapy Taping;Soft tissue mobilization   Soft tissue  mobilization STM with TPR to lateral Lt thigh   McConnell Lateral to medial                PT Education - 12/31/14 0842    Education provided Yes   Education Details abdominal activation with hip flexer stretch.    Person(s) Educated Patient   Methods Explanation;Demonstration;Tactile cues   Comprehension Verbalized understanding;Returned demonstration          PT Short Term Goals - 12/22/14 1716    PT SHORT TERM GOAL #1   Title pt will be I with inital HEP ( 01/12/2015)   Time 3   Period Weeks   Status New   PT SHORT TERM GOAL #2   Title pt will be able to verbalize and demonstrate techniques to contro bil knee pain and swelling via RICE, and HEP ( 01/12/2015)   Time 6   Period Weeks   Status New           PT Long Term Goals - 12/22/14 1717    PT LONG TERM GOAL #1   Title pt will be I with all HEP as of last visit ( 02/02/2015)   Time 6   Period Weeks   Status New  PT LONG TERM GOAL #2   Title pt will increase R knee extension to >/=4+/5 with </= 3/10 pain to assist with prolonged standing/ walking (02/02/2015)   Time 6   Period Weeks   Status New   PT LONG TERM GOAL #3   Title pt will increase her L abduction and bil hip extension strength to >/=4/5 to assist with decreased medial collapse of the knee during step downs (02/02/2015)    Time 6   Period Weeks   Status New   PT LONG TERM GOAL #4   Title pt will be able to navigate >/=10 stairs with </= 3/10 pain to asisst with ADLs and community ambulation (02/02/2015)   Time 6   Period Weeks   Status New   PT LONG TERM GOAL #5   Title pt will increase her FOTO score to >/= 50 to demonstrate improved function at discharge (02/02/2015)   Time [place] - 12/31/14 0843    Clinical Impression Statement Patient reporting that she is making gradual improvements, no questions with HEP. Patient does fatigue quickly with exercises, both through quad and hip abductor  musculature. Patent benefiting from PT sessions, continue with skilled PT for increasing strenght and mobiltiy and decreasing pain.    PT Next Visit Plan Progress strenghening as tolerated.    PT Home Exercise Plan Check sequence, progress strengthening as tolerated. Add clam to HEP.   Consulted and Agree with Plan of Care Patient        Problem List There are no active problems to display for this patient.   Linard Millers, PT 12/31/2014, 8:48 AM  [facility] [address] [place], [place], [address] Phone: [phone]   Fax:  [phone]  Name: Veronica Russell MRN: [id number] Date of Birth: 11/22/1960

## 2015-01-02 ENCOUNTER — Ambulatory Visit: Payer: 59 | Attending: Physician Assistant | Admitting: Physical Therapy

## 2015-01-02 DIAGNOSIS — M25561 Pain in right knee: Secondary | ICD-10-CM | POA: Insufficient documentation

## 2015-01-02 DIAGNOSIS — M25562 Pain in left knee: Secondary | ICD-10-CM | POA: Insufficient documentation

## 2015-01-02 DIAGNOSIS — R269 Unspecified abnormalities of gait and mobility: Secondary | ICD-10-CM | POA: Insufficient documentation

## 2015-01-02 DIAGNOSIS — R29898 Other symptoms and signs involving the musculoskeletal system: Secondary | ICD-10-CM | POA: Insufficient documentation

## 2015-01-02 NOTE — Therapy (Signed)
[place], [place], [address] Phone: [phone]   Fax:  [phone]  Physical Therapy Treatment  Patient Details  Name: Veronica Russell MRN: [id number] Date of Birth: Apr 01, 1960 Referring Provider: Narda Amber PA-C  Encounter Date: 01/02/2015      PT End of Session - 01/02/15 0852    Visit Number 4   Number of Visits 12   Date for PT Re-Evaluation 02/02/15   PT Start Time 0801   PT Stop Time [id number]   PT Time Calculation (min) 43 min      Past Medical History  Diagnosis Date  . Hyperlipidemia   . Hypertension   . Obesity     Past Surgical History  Procedure Laterality Date  . Abdominal hysterectomy    . Myomectomy      There were no vitals filed for this visit.  Visit Diagnosis:  Bilateral knee pain  Abnormality of gait  Left leg weakness      Subjective Assessment - 01/02/15 0803    Subjective Did great after last session, no problems. Muscles didn't get as sore.    Currently in Pain? Yes   Pain Score 1    Pain Location Knee   Pain Orientation Left   Pain Descriptors / Indicators Dull                         [facility] Adult PT Treatment/Exercise - 01/02/15 0001    Knee/Hip Exercises: Aerobic   Nustep L5 x 8 min   Knee/Hip Exercises: Machines for Strengthening   Cybex Knee Extension 2 x 10 with 2 plates   Knee/Hip Exercises: Seated   Other Seated Knee/Hip Exercises knee flexion, blue band 2X10 bilaterally   Sit to Sand 1 set;10 reps;with UE support   Knee/Hip Exercises: Supine   Short Arc Quad Sets Strengthening;Both;2 sets;10 reps  manual resist, eccentric focus   Other Supine Knee/Hip Exercises bent knee hip abduction blue band 2X10   Other Supine Knee/Hip Exercises ball squeeze 2X10   Manual Therapy   Soft tissue mobilization STM with TPR to lateral Lt thigh                PT Education - 01/02/15 0851    Education provided Yes   Education  Details reviewed sit/stand - to incorporate into daily activities with adding intermittant extra sit/stands trhoughout the day.    Person(s) Educated Patient   Methods Explanation;Demonstration;Tactile cues;Verbal cues   Comprehension Verbalized understanding;Returned demonstration          PT Short Term Goals - 12/22/14 1716    PT SHORT TERM GOAL #1   Title pt will be I with inital HEP ( 01/12/2015)   Time 3   Period Weeks   Status New   PT SHORT TERM GOAL #2   Title pt will be able to verbalize and demonstrate techniques to contro bil knee pain and swelling via RICE, and HEP ( 01/12/2015)   Time 6   Period Weeks   Status New           PT Long Term Goals - 12/22/14 1717    PT LONG TERM GOAL #1   Title pt will be I with all HEP as of last visit ( 02/02/2015)   Time 6   Period Weeks   Status New   PT LONG TERM GOAL #2   Title pt will increase R knee extension to >/=4+/5 with </=  3/10 pain to assist with prolonged standing/ walking (02/02/2015)   Time 6   Period Weeks   Status New   PT LONG TERM GOAL #3   Title pt will increase her L abduction and bil hip extension strength to >/=4/5 to assist with decreased medial collapse of the knee during step downs (02/02/2015)    Time 6   Period Weeks   Status New   PT LONG TERM GOAL #4   Title pt will be able to navigate >/=10 stairs with </= 3/10 pain to asisst with ADLs and community ambulation (02/02/2015)   Time 6   Period Weeks   Status New   PT LONG TERM GOAL #5   Title pt will increase her FOTO score to >/= 50 to demonstrate improved function at discharge (02/02/2015)   Time [place] - 01/02/15 [id number]    Clinical Impression Statement Patinet reporting that she is making some progress with PT sessions. At this time strengthening is primary focus through bilateral LEs. Able to advance strengthening during session today without patient complaints. By end of session patient was tired  but no increased reports of pain. Will contiue to advance strengthening as tolerated.    PT Next Visit Plan Continue with LE strengthening as tolerted, incorporate hip stabilization as well for overall LE stabilization. Step ups, 4 way standing hip exercises.    PT Home Exercise Plan squats, 4way hip exs,    Consulted and Agree with Plan of Care Patient        Problem List There are no active problems to display for this patient.   Linard Millers, PT 01/02/2015, 8:59 AM  [facility] [address] [place], [place], [address] Phone: [phone]   Fax:  [phone]  Name: Veronica Russell MRN: [id number] Date of Birth: 02/27/1960

## 2015-01-06 ENCOUNTER — Ambulatory Visit: Payer: 59 | Admitting: Physical Therapy

## 2015-01-06 ENCOUNTER — Encounter: Payer: 59 | Admitting: Sports Medicine

## 2015-01-06 DIAGNOSIS — R269 Unspecified abnormalities of gait and mobility: Secondary | ICD-10-CM

## 2015-01-06 DIAGNOSIS — M25561 Pain in right knee: Secondary | ICD-10-CM

## 2015-01-06 DIAGNOSIS — R29898 Other symptoms and signs involving the musculoskeletal system: Secondary | ICD-10-CM

## 2015-01-06 DIAGNOSIS — M25562 Pain in left knee: Principal | ICD-10-CM

## 2015-01-06 NOTE — Patient Instructions (Signed)
Plantarflexion: Heel Lift - Lowering (Eccentric) - Double Leg    Rise up on toes, holding support. Slowly lower heels for 3-5 seconds. Bear more weight on affected leg when lowering heels, as tolerated. _10 -25__ reps per set 1 X a day.  Single leg vs 2.Abduction: Clam (Eccentric) - Side-Lying    Lie on side with knees bent. Lift top knee, keeping feet together. Keep trunk steady. Slowly lower for 3-5 seconds. _10__ reps per set, 1___ sets per day. Following pilates technique.    Hip SLR standing 3 way 10 - 30 X each way 1 X a day.  Holding 1 second.     http://ecce.exer.us/65   Copyright  VHI. All rights reserved.     http://ecce.exer.us/17   Copyright  VHI. All rights reserved.

## 2015-01-06 NOTE — Therapy (Signed)
[place], [place], [address] Phone: [phone]   Fax:  [phone]  Physical Therapy Treatment  Patient Details  Name: Veronica Russell MRN: [id number] Date of Birth: [birth year] Referring Provider: Narda Amber PA-C  Encounter Date: 01/06/2015      PT End of Session - 01/06/15 0947    Visit Number 5   Number of Visits 12   Date for PT Re-Evaluation 02/02/15   PT Start Time 0803   PT Stop Time 0845   PT Time Calculation (min) 42 min   Activity Tolerance Patient tolerated treatment well   Behavior During Therapy [facility] for tasks assessed/performed      Past Medical History  Diagnosis Date  . Hyperlipidemia   . Hypertension   . Obesity     Past Surgical History  Procedure Laterality Date  . Abdominal hysterectomy    . Myomectomy      There were no vitals filed for this visit.  Visit Diagnosis:  Bilateral knee pain  Abnormality of gait  Left leg weakness      Subjective Assessment - 01/06/15 0810    Subjective Rt hip 2/10.  Knees OK this morning.     Currently in Pain? Yes  hips   Pain Score 2                          [facility] Adult PT Treatment/Exercise - 01/06/15 [id number]    Knee/Hip Exercises: Standing   Forward Step Up Limitations 4 inches 10 X each cues to maintain foot arch.   Wall Squat 5 reps  Lt  knee grinding so stopped   Knee/Hip Exercises: Supine   Quad Sets 5 reps   Bridges Limitations 10 X, cues                PT Education - 01/06/15 0946    Education provided Yes   Education Details Hip 3 way, heel lifts, clams   Person(s) Educated Patient   Methods Explanation;Demonstration;Tactile cues;Verbal cues;Handout   Comprehension Verbalized understanding;Returned demonstration          PT Short Term Goals - 01/06/15 0951    PT SHORT TERM GOAL #1   Title pt will be I with inital HEP ( 01/12/2015)   Time 3   Period Weeks   Status On-going    PT SHORT TERM GOAL #2   Title pt will be able to verbalize and demonstrate techniques to contro bil knee pain and swelling via RICE, and HEP ( 01/12/2015)   Time 6   Period Weeks   Status On-going           PT Long Term Goals - 12/22/14 1717    PT LONG TERM GOAL #1   Title pt will be I with all HEP as of last visit ( 02/02/2015)   Time 6   Period Weeks   Status New   PT LONG TERM GOAL #2   Title pt will increase R knee extension to >/=4+/5 with </= 3/10 pain to assist with prolonged standing/ walking (02/02/2015)   Time 6   Period Weeks   Status New   PT LONG TERM GOAL #3   Title pt will increase her L abduction and bil hip extension strength to >/=4/5 to assist with decreased medial collapse of the knee during step downs (02/02/2015)    Time 6   Period Weeks   Status New   PT LONG  TERM GOAL #4   Title pt will be able to navigate >/=10 stairs with </= 3/10 pain to asisst with ADLs and community ambulation (02/02/2015)   Time 6   Period Weeks   Status New   PT LONG TERM GOAL #5   Title pt will increase her FOTO score to >/= 50 to demonstrate improved function at discharge (02/02/2015)   Time [place] - 01/06/15 [id number]    Clinical Impression Statement No need of ice post session.  closed chain exercises tolerated today.  Progress toward home exercises.    PT Next Visit Plan Continue with LE strengthening as tolerted, incorporate hip stabilization as well for overall LE stabilization. Step ups, 4 way standing hip exercises. calf stretch.   PT Home Exercise Plan 3 way hip, clams sidelying, heel lifts   Consulted and Agree with Plan of Care Patient        Problem List There are no active problems to display for this patient.   [facility] 01/06/2015, 9:52 AM  [facility] [address] [place], [place], [address] Phone: [phone]   Fax:  [phone]  Name: NORIKO PROSCH MRN: [id number] Date of Birth: [birth year]    Melvenia Needles, PTA 01/06/2015 9:52 AM Phone: [phone] Fax: [phone]

## 2015-01-07 ENCOUNTER — Encounter: Payer: Self-pay | Admitting: *Deleted

## 2015-01-07 ENCOUNTER — Other Ambulatory Visit: Payer: Self-pay | Admitting: *Deleted

## 2015-01-07 ENCOUNTER — Encounter: Payer: Self-pay | Admitting: Internal Medicine

## 2015-01-07 VITALS — BP 102/74 | HR 92 | Ht 64.5 in | Wt 251.4 lb

## 2015-01-07 DIAGNOSIS — E119 Type 2 diabetes mellitus without complications: Secondary | ICD-10-CM

## 2015-01-07 LAB — POCT GLYCOSYLATED HEMOGLOBIN (HGB A1C): Hemoglobin A1C: 6.8

## 2015-01-08 ENCOUNTER — Ambulatory Visit: Payer: 59 | Admitting: Physical Therapy

## 2015-01-08 ENCOUNTER — Encounter: Payer: Self-pay | Admitting: Sports Medicine

## 2015-01-08 ENCOUNTER — Ambulatory Visit (INDEPENDENT_AMBULATORY_CARE_PROVIDER_SITE_OTHER): Payer: 59 | Admitting: Sports Medicine

## 2015-01-08 VITALS — BP 124/82 | Ht 64.0 in | Wt 250.0 lb

## 2015-01-08 DIAGNOSIS — M2141 Flat foot [pes planus] (acquired), right foot: Secondary | ICD-10-CM | POA: Diagnosis not present

## 2015-01-08 DIAGNOSIS — M2142 Flat foot [pes planus] (acquired), left foot: Secondary | ICD-10-CM | POA: Diagnosis not present

## 2015-01-08 DIAGNOSIS — R269 Unspecified abnormalities of gait and mobility: Secondary | ICD-10-CM

## 2015-01-08 DIAGNOSIS — R29898 Other symptoms and signs involving the musculoskeletal system: Secondary | ICD-10-CM

## 2015-01-08 DIAGNOSIS — M25562 Pain in left knee: Principal | ICD-10-CM

## 2015-01-08 DIAGNOSIS — M25561 Pain in right knee: Secondary | ICD-10-CM | POA: Diagnosis not present

## 2015-01-08 NOTE — Progress Notes (Signed)
   Subjective:    Patient ID: Veronica Russell, female    DOB: 1961-01-02, 54 y.o.   MRN: [id number]  HPI chief complaint: Bilateral knee pain  Very pleasant 54 year old female comes in today requesting custom orthotics. She is a patient at [facility]. She has been diagnosed with knee DJD. They have sent her for some physical therapy and they also wanted her to get some custom orthotics. She had originally received a prescription for [facility] but through word of mouth from her personal trainer she decided to come here instead. She has not had custom orthotics before.  Past medical history reviewed. Significant for diabetes Medications reviewed Allergies reviewed    Review of Systems    as above Objective:   Physical Exam  Obese. No acute distress  Examination of each foot shows pes planus with standing. No significant callus formation. Good dorsalis pedis and posterior tibial pulses. Normal color, normal temperature. Sensation is intact to light touch.      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 I think we can go ahead and make her custom orthotics. She'll continue with her care at [facility] for her chronic knee pain and will follow-up with [name] as needed.  Total time spent with the patient was 30 minutes with greater than 50% of the time spent in face-to-face consultation discussing orthotic construction, instruction, and fitting. Patient found the orthotics comfortable prior to leaving the office.  Patient was fitted for a : standard, cushioned, semi-rigid orthotic. The orthotic was heated and afterward the patient stood on the orthotic blank positioned on the orthotic stand. The patient was positioned in subtalar neutral position and 10 degrees of ankle dorsiflexion in a weight bearing stance. After completion of molding, a stable base was applied to the  orthotic blank. The blank was ground to a stable position for weight bearing. Size:8 Base: Blue EVA Posting: none Additional orthotic padding: none

## 2015-01-08 NOTE — Therapy (Signed)
[place], [place], [address] Phone: [phone]   Fax:  [phone]  Physical Therapy Treatment  Patient Details  Name: Veronica Russell MRN: [id number] Date of Birth: Jan 11, 1961 Referring Provider: Narda Amber PA-C  Encounter Date: 01/08/2015      PT End of Session - 01/08/15 0844    Visit Number 6   Number of Visits 12   Date for PT Re-Evaluation 02/02/15   PT Start Time 0800   PT Stop Time 0845   PT Time Calculation (min) 45 min   Activity Tolerance Patient tolerated treatment well   Behavior During Therapy [facility] for tasks assessed/performed      Past Medical History  Diagnosis Date  . Hyperlipidemia   . Hypertension   . Obesity     Past Surgical History  Procedure Laterality Date  . Abdominal hysterectomy    . Myomectomy      There were no vitals filed for this visit.  Visit Diagnosis:  Bilateral knee pain  Abnormality of gait  Left leg weakness      Subjective Assessment - 01/08/15 0803    Subjective "I am doing better, less pain and less cracking and popping in the knee"    Pain Score 1    Pain Location Knee   Pain Orientation Left   Pain Descriptors / Indicators --  pulling   Pain Type Chronic pain   Pain Onset More than a month ago   Pain Frequency Intermittent   Aggravating Factors  stairs, prolonged walking, standing   Pain Relieving Factors exercises                         [facility] Adult PT Treatment/Exercise - 01/08/15 0805    Self-Care   Self-Care Other Self-Care Comments   Other Self-Care Comments  perform deep tissue massage over the vastus lateralis with tennis ball   Knee/Hip Exercises: Stretches   Passive Hamstring Stretch Left;1 rep;30 seconds   Hip Flexor Stretch 2 reps;30 seconds   Gastroc Stretch 2 reps;30 seconds   Soleus Stretch 2 reps;30 seconds   Knee/Hip Exercises: Aerobic   Nustep L6 x 9 min   Knee/Hip Exercises: Machines for  Strengthening   Cybex Knee Extension 2 x 10 with 2 plates, up with both down with LLE only   Total Gym Leg Press 2 x 10 2 plates bil LE  with ball between knees   Knee/Hip Exercises: Standing   Forward Step Up Both;Step Height: 4";2 sets;Step Height: 6";10 reps   Wall Squat 1 set;10 reps   SLS with Vectors 3-way hip 2 x 10  with red theraband   Other Standing Knee Exercises lateral band walks 2 x 15 ft                PT Education - 01/08/15 0843    Education provided Yes   Education Details discussed progress, and how to perfrom DTM over quad   Person(s) Educated Patient   Methods Explanation   Comprehension Verbalized understanding          PT Short Term Goals - 01/06/15 0951    PT SHORT TERM GOAL #1   Title pt will be I with inital HEP ( 01/12/2015)   Time 3   Period Weeks   Status On-going   PT SHORT TERM GOAL #2   Title pt will be able to verbalize and demonstrate techniques to contro bil knee pain and  swelling via RICE, and HEP ( 01/12/2015)   Time 6   Period Weeks   Status On-going           PT Long Term Goals - 12/22/14 1717    PT LONG TERM GOAL #1   Title pt will be I with all HEP as of last visit ( 02/02/2015)   Time 6   Period Weeks   Status New   PT LONG TERM GOAL #2   Title pt will increase R knee extension to >/=4+/5 with </= 3/10 pain to assist with prolonged standing/ walking (02/02/2015)   Time 6   Period Weeks   Status New   PT LONG TERM GOAL #3   Title pt will increase her L abduction and bil hip extension strength to >/=4/5 to assist with decreased medial collapse of the knee during step downs (02/02/2015)    Time 6   Period Weeks   Status New   PT LONG TERM GOAL #4   Title pt will be able to navigate >/=10 stairs with </= 3/10 pain to asisst with ADLs and community ambulation (02/02/2015)   Time 6   Period Weeks   Status New   PT LONG TERM GOAL #5   Title pt will increase her FOTO score to >/= 50 to demonstrate improved function at  discharge (02/02/2015)   Time [place] - 01/08/15 0844    Clinical Impression Statement Shanicia continues to demonstrate improvement with decreased L knee pain. Focused todays session strengthening and [facility] activities. She reports some popping in the knee during step downs, and wall squats but reports it isn't pain and she was able to finish all exercises given. Discussed pt's progress and may begin tapering her treatment sessions next week to 1 x  a week.    PT Next Visit Plan Continue with LE strengthening as tolerted, incorporate hip stabilization as well for overall LE stabilization. Step ups, 4 way standing hip exercises. calf stretch.   Consulted and Agree with Plan of Care Patient        Problem List There are no active problems to display for this patient.  [name] PT, DPT, LAT, ATC  01/08/2015  8:46 AM     [facility] [address] [place], [place], [address] Phone: [phone]   Fax:  [phone]  Name: ADELISA BREACH MRN: [id number] Date of Birth: [birth year]

## 2015-01-09 ENCOUNTER — Encounter: Payer: Self-pay | Admitting: *Deleted

## 2015-01-09 NOTE — Patient Outreach (Signed)
[facility]) Care Management   01/07/15  LAREEN MULLINGS [birth year] [id number]  Veronica Russell is an 54 y.o. female who presents to the [place] Management office for routine Link To Wellness follow up for self management assistance with Type II DM, HTN and hyperlipidemia.  Subjective:  Encarnacion says she currently unable to exercise because she injured her left knee during a session with her personal trainer in mid November. She has been going to outpatient physical therapy twice weekly since  11/21. She says her knee does not hurt unless she is standing or waking for or prolonged periods of time and she says the therapy is helping. She says the sports medicine doctor told her when she resumes exercising she should either swim or ride a reclining stationary bike. She also says she will be fitted for orthotic shoe inserts to help with her knee issues. She says she has noticed that her fasting blood sugars were increasing so she made an "executive decision" to increase her Metformin to 3 tablets daily.  She is requesting a POC Hgb A1C today. She says her parents are doing OK and her Mom remains in ongoing remission from multiple myeloma x 11 years.  Objective:   Review of Systems  Constitutional: Negative.     Physical Exam  Constitutional: She is oriented to person, place, and time. She appears well-developed and well-nourished.  Respiratory: Effort normal.  Neurological: She is alert and oriented to person, place, and time.  Skin: Skin is warm and dry.  Psychiatric: She has a normal mood and affect. Her behavior is normal. Judgment and thought content normal.   Filed Vitals:   01/07/15 1724  BP: 102/74  Pulse: 92   Filed Weights   01/07/15 1724  Weight: 251 lb 6.4 oz (114.034 kg)  POC Hgb A1C= 6.8% Current Medications:   Current Outpatient Prescriptions  Medication Sig Dispense Refill  . amLODipine (NORVASC) 5 MG tablet Take 5 mg by  mouth daily.    Marland Kitchen atorvastatin (LIPITOR) 20 MG tablet Take 20 mg by mouth daily.    Marland Kitchen lisinopril-hydrochlorothiazide (PRINZIDE,ZESTORETIC) 20-12.5 MG per tablet Take 1 tablet by mouth 2 (two) times daily.     . metFORMIN (GLUCOPHAGE-XR) 500 MG 24 hr tablet Take 500 mg by mouth 2 (two) times daily. Sometimes takes 3 tablets when her fasting blood sugars are not meeting target.    . propranolol (INDERAL) 20 MG tablet Take 20 mg by mouth 2 (two) times daily.     . Vitamin D, Ergocalciferol, (DRISDOL) 50000 UNITS CAPS Take 50,000 Units by mouth.    . Clobetasol Propionate 0.05 % lotion Apply to affected areas of scalp 3-4 times per week    . finasteride (PROSCAR) 5 MG tablet Take one half tablet by mouth every day.    . Melatonin 3 MG TABS Take 1 tablet by mouth at bedtime as needed.    . TRUE METRIX BLOOD GLUCOSE TEST test strip   5   No current facility-administered medications for this visit.    Functional Status:   In your present state of health, do you have any difficulty performing the following activities: 01/07/2015 10/08/2014  Hearing? N N  Vision? N N  Difficulty concentrating or making decisions? N N  Walking or climbing stairs? N N  Dressing or bathing? N -  Doing errands, shopping? N N    Fall/Depression Screening:    PHQ 2/9 Scores 10/08/2014 07/02/2014  PHQ - 2  Score 0 0    Assessment:   [employer] employee and Link To Wellness member with Type II DM, HTN and hyperlipidemia. Currently meeting all treatment targets but with increased fasting blood sugars and Hgb A1C increased from 6.5% to  6.8% in last 4 months.  Plan:  [facility] CM Care Plan Problem One        Most Recent Value   Care Plan Problem One  Ongoing good control of Type II DM, HTN and Hyperlipidemia as evidenced by meeting treatment targets for Hgb A1C, BP and lipid profile parameters [copied from previous 07/02/2014 note]   Role Documenting the Problem One  Care Management [place] for Problem One  Active    [facility] Long Term Goal (31-90 days)  Continued good control of Type II DM, HTN and Hyperlipidemia as evidenced by meeting treatment targets of  Hgb A1C<7.0%, BP<140/<90 and normal lipid profile at next check   [facility] Long Term Goal Start Date  01/07/15   Interventions for Problem One Long Term Goal  Reviewed strategies for ongoing good control of DM, HTN and hyperlipidemia, discussed likely need for increase in Metformin dose since her fasting blood sugars are increasing and she has been on 1000 mg of Metformin for 3 years, discussed maximum daily dose of Metformin, encouraged Quinisha to begin exercising when her knee injury has resolved, ensured patient is up to date on her health maintenance checks, arranged for Link To Wellness follow up in March      RNCM to fax today's office visit note to Dr. Radene Ou. RNCM will meet quarterly and as needed with patient per Link To Wellness program guidelines to assist with Type II DM, HTN and hyperlipidemia self-management and assess patient's progress toward mutually set goals.   Barrington Ellison RN,CCM,CDE [place] Management Coordinator Link To Wellness Office Phone [phone] Office Fax [phone]

## 2015-01-13 ENCOUNTER — Ambulatory Visit: Payer: 59 | Admitting: Physical Therapy

## 2015-01-13 DIAGNOSIS — M25561 Pain in right knee: Secondary | ICD-10-CM | POA: Diagnosis not present

## 2015-01-13 DIAGNOSIS — R269 Unspecified abnormalities of gait and mobility: Secondary | ICD-10-CM

## 2015-01-13 DIAGNOSIS — M25562 Pain in left knee: Principal | ICD-10-CM

## 2015-01-13 DIAGNOSIS — R29898 Other symptoms and signs involving the musculoskeletal system: Secondary | ICD-10-CM

## 2015-01-13 NOTE — Therapy (Signed)
[place], [place], [address] Phone: [phone]   Fax:  [phone]  Physical Therapy Treatment  Patient Details  Name: Veronica Russell MRN: [id number] Date of Birth: [birth year] Referring Provider: Narda Amber PA-C  Encounter Date: 01/13/2015      PT End of Session - 01/13/15 0845    Visit Number 7   Date for PT Re-Evaluation 02/02/15   PT Start Time 0801   PT Stop Time 0845   PT Time Calculation (min) 44 min   Activity Tolerance Patient tolerated treatment well   Behavior During Therapy [facility] for tasks assessed/performed      Past Medical History  Diagnosis Date  . Hyperlipidemia   . Hypertension   . Obesity     Past Surgical History  Procedure Laterality Date  . Abdominal hysterectomy    . Myomectomy      There were no vitals filed for this visit.  Visit Diagnosis:  Bilateral knee pain  Abnormality of gait  Left leg weakness      Subjective Assessment - 01/13/15 0805    Subjective Stiff today.  Sunday was rough with the cold.  She aviods steps.  Walking a little longer, less popping in knees.   Currently in Pain? --  Did not answer question   Pain Location Knee   Pain Orientation Left   Pain Descriptors / Indicators --  stiff   Pain Frequency Intermittent   Aggravating Factors  longer walking, standing   Pain Relieving Factors PT.                          [facility] Adult PT Treatment/Exercise - 01/13/15 0808    Knee/Hip Exercises: Aerobic   Nustep L6 10 minutes.   Knee/Hip Exercises: Machines for Strengthening   Cybex Knee Flexion both 3 plates 3 sets of 10   Total Gym Leg Press 10 X 2 sets of 10 with ball, [id number]   Knee/Hip Exercises: Standing   Abduction Limitations 10 X 2 sets both, 1 hand cues foot position   Hip Extension AROM;Stengthening;Both;2 sets;10 reps;Knee straight   Forward Step Up Both;2 sets;15 reps;Hand Hold: 2;Step Height: 6"   Manual  Therapy   Manual therapy comments soft tissue work lateral thiand anterior thigh including strumming, retrograde.                    PT Short Term Goals - 01/13/15 0847    PT SHORT TERM GOAL #1   Title pt will be I with inital HEP ( 01/12/2015)   Time 3   Period Weeks   Status Achieved   PT SHORT TERM GOAL #2   Title pt will be able to verbalize and demonstrate techniques to contro bil knee pain and swelling via RICE, and HEP ( 01/12/2015)   Time 6   Period Weeks   Status On-going           PT Long Term Goals - 12/22/14 1717    PT LONG TERM GOAL #1   Title pt will be I with all HEP as of last visit ( 02/02/2015)   Time 6   Period Weeks   Status New   PT LONG TERM GOAL #2   Title pt will increase R knee extension to >/=4+/5 with </= 3/10 pain to assist with prolonged standing/ walking (02/02/2015)   Time 6   Period Weeks   Status New   PT  LONG TERM GOAL #3   Title pt will increase her L abduction and bil hip extension strength to >/=4/5 to assist with decreased medial collapse of the knee during step downs (02/02/2015)    Time 6   Period Weeks   Status New   PT LONG TERM GOAL #4   Title pt will be able to navigate >/=10 stairs with </= 3/10 pain to asisst with ADLs and community ambulation (02/02/2015)   Time 6   Period Weeks   Status New   PT LONG TERM GOAL #5   Title pt will increase her FOTO score to >/= 50 to demonstrate improved function at discharge (02/02/2015)   Time 6   Period Weeks   Status New               Plan - 01/13/15 0845    Clinical Impression Statement Patients wants he popping to stop more than ant thing else,.  Able to increase reps and weights with exercise today.  Function gradually improving tho she avoids steps.   PT Next Visit Plan try steps?  Strengthening   PT Home Exercise Plan amstring green band        Problem List There are no active problems to display for this patient.   [facility] 01/13/2015, 8:54 AM  [facility] [address] [place], [place], [address] Phone: [phone]   Fax:  [phone]  Name: Veronica Russell MRN: [id number] Date of Birth: May 21, 1960    Melvenia Needles, PTA 01/13/2015 8:54 AM Phone: [phone] Fax: [phone]

## 2015-01-16 ENCOUNTER — Ambulatory Visit: Payer: 59 | Admitting: Physical Therapy

## 2015-01-16 DIAGNOSIS — M25562 Pain in left knee: Principal | ICD-10-CM

## 2015-01-16 DIAGNOSIS — R29898 Other symptoms and signs involving the musculoskeletal system: Secondary | ICD-10-CM

## 2015-01-16 DIAGNOSIS — R269 Unspecified abnormalities of gait and mobility: Secondary | ICD-10-CM

## 2015-01-16 DIAGNOSIS — M25561 Pain in right knee: Secondary | ICD-10-CM | POA: Diagnosis not present

## 2015-01-16 NOTE — Therapy (Signed)
[place], [place], [address] Phone: [phone]   Fax:  [phone]  Physical Therapy Treatment  Patient Details  Name: Veronica Russell MRN: [id number] Date of Birth: 09/15/1960 Referring Provider: Narda Amber PA-C  Encounter Date: 01/16/2015      PT End of Session - 01/16/15 0807    Visit Number 8   Number of Visits 12   Date for PT Re-Evaluation 02/02/15   PT Start Time 0800   PT Stop Time 0844   PT Time Calculation (min) 44 min   Activity Tolerance Patient tolerated treatment well   Behavior During Therapy [facility] for tasks assessed/performed      Past Medical History  Diagnosis Date  . Hyperlipidemia   . Hypertension   . Obesity     Past Surgical History  Procedure Laterality Date  . Abdominal hysterectomy    . Myomectomy      There were no vitals filed for this visit.  Visit Diagnosis:  Bilateral knee pain  Abnormality of gait  Left leg weakness      Subjective Assessment - 01/16/15 0802    Subjective "I feel stiff with the cold but have been getting better, I even tried the steps and it wasn't as bad as I thought it would be"    Currently in Pain? No/denies   Pain Location Knee   Pain Orientation Left                         [facility] Adult PT Treatment/Exercise - 01/16/15 0803    Knee/Hip Exercises: Stretches   Hip Flexor Stretch 2 reps;30 seconds   Knee/Hip Exercises: Aerobic   Nustep L6 x 6 min   Knee/Hip Exercises: Machines for Strengthening   Cybex Knee Extension 3 x 10 with 2 plates, up with both down with LLE only   Cybex Knee Flexion both 3 plates 2 sets of 12   Total Gym Leg Press 3 plates 10 X 2 sets of 10 with ball with 5 sec controlled lowering   Knee/Hip Exercises: Standing   Heel Raises 2 sets;15 reps  with feet off edge of step   Forward Step Up Both;2 sets;Step Height: 6";10 reps;Step Height: 8";Hand Hold: 1   Step Down Both;Step Height:  4";10 reps;1 set   Step Down Limitations attempted 2 sets but popping with pain during step down limited to 1 set only   Wall Squat 1 set;10 reps   Manual Therapy   Manual therapy comments soft tissue work lateral thiand anterior thigh including strumming, retrograde.     Myofascial Release manual trigger point release of the Vastus lateralis                  PT Short Term Goals - 01/13/15 0847    PT SHORT TERM GOAL #1   Title pt will be I with inital HEP ( 01/12/2015)   Time 3   Period Weeks   Status Achieved   PT SHORT TERM GOAL #2   Title pt will be able to verbalize and demonstrate techniques to contro bil knee pain and swelling via RICE, and HEP ( 01/12/2015)   Time 6   Period Weeks   Status On-going           PT Long Term Goals - 12/22/14 1717    PT LONG TERM GOAL #1   Title pt will be I with all HEP as of last visit (  02/02/2015)   Time 6   Period Weeks   Status New   PT LONG TERM GOAL #2   Title pt will increase R knee extension to >/=4+/5 with </= 3/10 pain to assist with prolonged standing/ walking (02/02/2015)   Time 6   Period Weeks   Status New   PT LONG TERM GOAL #3   Title pt will increase her L abduction and bil hip extension strength to >/=4/5 to assist with decreased medial collapse of the knee during step downs (02/02/2015)    Time 6   Period Weeks   Status New   PT LONG TERM GOAL #4   Title pt will be able to navigate >/=10 stairs with </= 3/10 pain to asisst with ADLs and community ambulation (02/02/2015)   Time 6   Period Weeks   Status New   PT LONG TERM GOAL #5   Title pt will increase her FOTO score to >/= 50 to demonstrate improved function at discharge (02/02/2015)   Time [place] - 01/16/15 0842    Clinical Impression Statement Veronica Russell reports that she is doing well and has been trying to do more including stairs. Focused todays session on CKC strengtheing of the hips and knees. Began step  downs which she reported some pain full popping so halted exercises following 1 set.  continue to proceed with strengthening, and CKC activities including stairs.    PT Next Visit Plan stairs, CKC strengthening, Manual PRN for vastus lateralis tightness   Consulted and Agree with Plan of Care Patient        Problem List There are no active problems to display for this patient.  [name] PT, DPT, LAT, ATC  01/16/2015  8:46 AM    [facility] [address] [place], [place], [address] Phone: [phone]   Fax:  [phone]  Name: Veronica Russell MRN: [id number] Date of Birth: 16-Aug-1960

## 2015-01-20 ENCOUNTER — Ambulatory Visit: Payer: 59 | Admitting: Physical Therapy

## 2015-01-22 ENCOUNTER — Ambulatory Visit: Payer: 59 | Admitting: Physical Therapy

## 2015-01-22 DIAGNOSIS — R29898 Other symptoms and signs involving the musculoskeletal system: Secondary | ICD-10-CM

## 2015-01-22 DIAGNOSIS — M25561 Pain in right knee: Secondary | ICD-10-CM | POA: Diagnosis not present

## 2015-01-22 DIAGNOSIS — R269 Unspecified abnormalities of gait and mobility: Secondary | ICD-10-CM

## 2015-01-22 DIAGNOSIS — M25562 Pain in left knee: Principal | ICD-10-CM

## 2015-01-22 NOTE — Therapy (Addendum)
[place], [place], [address] Phone: [phone]   Fax:  [phone]  Physical Therapy Treatment  Patient Details  Name: Veronica Russell MRN: [id number] Date of Birth: [birth year] Referring Provider: Narda Amber PA-C  Encounter Date: 01/22/2015      PT End of Session - 01/22/15 0844    Visit Number 9   Number of Visits 12   Date for PT Re-Evaluation 02/02/15   PT Start Time 0800   PT Stop Time 0844   PT Time Calculation (min) 44 min   Activity Tolerance Patient tolerated treatment well   Behavior During Therapy [facility] for tasks assessed/performed      Past Medical History  Diagnosis Date  . Hyperlipidemia   . Hypertension   . Obesity     Past Surgical History  Procedure Laterality Date  . Abdominal hysterectomy    . Myomectomy      There were no vitals filed for this visit.  Visit Diagnosis:  Bilateral knee pain  Abnormality of gait  Left leg weakness      Subjective Assessment - 01/22/15 0805    Subjective "I feel al ittle stiff but no pain"    Currently in Pain? No/denies   Pain Location Knee   Pain Orientation Left                         [facility] Adult PT Treatment/Exercise - 01/22/15 0806    Knee/Hip Exercises: Stretches   Hip Flexor Stretch 2 reps;30 seconds   Gastroc Stretch 2 reps;30 seconds  off edge of step   Knee/Hip Exercises: Aerobic   Nustep L6 x 10 min   Knee/Hip Exercises: Machines for Strengthening   Cybex Knee Extension 2 x 10 with 2 plates, up with both down with LLE only   Cybex Knee Flexion --   Total Gym Leg Press 3 plates 10 X 2 sets, 1 x 10 with 3.5 plates  with 3 sec eccentric lowering   Knee/Hip Exercises: Standing   Heel Raises 2 sets;10 reps   Hip Abduction --   Hip Extension --   Forward Step Up Both;2 sets;Step Height: 6";10 reps;Step Height: 8";Hand Hold: 1   Step Down Both;Step Height: 4";10 reps;1 set  VC to keep knee and  hip over knee to avoid popping   Wall Squat 2 sets;10 reps   SLS with Vectors 3-way hip 2 x 15  with red theraband                PT Education - 01/22/15 0843    Education provided Yes   Education Details progressed HEP to included resistance with hip strengthening.    Person(s) Educated Patient   Methods Explanation   Comprehension Verbalized understanding          PT Short Term Goals - 01/13/15 0847    PT SHORT TERM GOAL #1   Title pt will be I with inital HEP ( 01/12/2015)   Time 3   Period Weeks   Status Achieved   PT SHORT TERM GOAL #2   Title pt will be able to verbalize and demonstrate techniques to contro bil knee pain and swelling via RICE, and HEP ( 01/12/2015)   Time 6   Period Weeks   Status On-going           PT Long Term Goals - 12/22/14 1717    PT LONG TERM GOAL #1   Title  pt will be I with all HEP as of last visit ( 02/02/2015)   Time 6   Period Weeks   Status New   PT LONG TERM GOAL #2   Title pt will increase R knee extension to >/=4+/5 with </= 3/10 pain to assist with prolonged standing/ walking (02/02/2015)   Time 6   Period Weeks   Status New   PT LONG TERM GOAL #3   Title pt will increase her L abduction and bil hip extension strength to >/=4/5 to assist with decreased medial collapse of the knee during step downs (02/02/2015)    Time 6   Period Weeks   Status New   PT LONG TERM GOAL #4   Title pt will be able to navigate >/=10 stairs with </= 3/10 pain to asisst with ADLs and community ambulation (02/02/2015)   Time 6   Period Weeks   Status New   PT LONG TERM GOAL #5   Title pt will increase her FOTO score to >/= 50 to demonstrate improved function at discharge (02/02/2015)   Time [name] - 01/22/15 0928    Clinical Impression Statement Veronica Russell reported clicking and popping during step downs today with some soreness, edcuated to keep knee and hip over toes and she demonstrated no  popping but still some soreness. she was able complete all other exercises without complaint. pt declined ice or heat following todays session.    PT Next Visit Plan stairs, CKC strengthening, Manual PRN for vastus lateralis tightness   PT Home Exercise Plan hip abduction/ extension with red theraband.    Consulted and Agree with Plan of Care Patient        Problem List There are no active problems to display for this patient.  [name] PT, DPT, LAT, ATC  01/22/2015  9:30 AM     [facility] [address] [place], [place], [address] Phone: [phone]   Fax:  [phone]  Name: Veronica Russell MRN: [id number] Date of Birth: 1960/05/17    PHYSICAL THERAPY DISCHARGE SUMMARY  Visits from Start of Care: 9  Current functional level related to goals / functional outcomes: See goals   Remaining deficits: unknown   Education / Equipment: HEP  Plan:                                                    Patient goals were not met. Patient is being discharged due to not returning since the last visit.  ?????       [name] PT, DPT, LAT, ATC  03/20/2015  9:40 AM

## 2015-03-02 ENCOUNTER — Telehealth: Payer: 59 | Admitting: Nurse Practitioner

## 2015-03-02 DIAGNOSIS — R05 Cough: Secondary | ICD-10-CM | POA: Diagnosis not present

## 2015-03-02 DIAGNOSIS — R059 Cough, unspecified: Secondary | ICD-10-CM

## 2015-03-02 MED ORDER — AZITHROMYCIN 250 MG PO TABS: ORAL_TABLET | ORAL | Status: DC

## 2015-03-02 MED FILL — AZITHROMYCIN 250 MG TABLET: 250 | 5 days supply | Qty: 6 | Fill #0

## 2015-03-02 NOTE — Progress Notes (Signed)

## 2015-03-04 MED FILL — PREDNISOLONE 15 MG/5 ML SOL: 15 | 16 days supply | Qty: 240 | Fill #1

## 2015-03-14 DIAGNOSIS — J0110 Acute frontal sinusitis, unspecified: Secondary | ICD-10-CM | POA: Diagnosis not present

## 2015-03-23 DIAGNOSIS — E782 Mixed hyperlipidemia: Secondary | ICD-10-CM | POA: Diagnosis not present

## 2015-03-23 DIAGNOSIS — E119 Type 2 diabetes mellitus without complications: Secondary | ICD-10-CM | POA: Diagnosis not present

## 2015-03-23 DIAGNOSIS — I10 Essential (primary) hypertension: Secondary | ICD-10-CM | POA: Diagnosis not present

## 2015-03-23 DIAGNOSIS — Z7984 Long term (current) use of oral hypoglycemic drugs: Secondary | ICD-10-CM | POA: Diagnosis not present

## 2015-03-23 MED FILL — AMLODIPINE BESYLATE 5 MG TA: 5 | 90 days supply | Qty: 90 | Fill #0

## 2015-03-23 MED FILL — METFORMIN HCL ER 500 MG TAB: 500 | 90 days supply | Qty: 180 | Fill #0

## 2015-03-23 MED FILL — PROPRANOLOL 20 MG TABLET: 20 | 90 days supply | Qty: 180 | Fill #0

## 2015-03-23 MED FILL — LISINOPRIL-HCTZ 20-12.5 MG: 20-12.5 | 90 days supply | Qty: 180 | Fill #0

## 2015-03-23 MED FILL — GLIMEPIRIDE 1 MG TABLET: 1 | 90 days supply | Qty: 180 | Fill #0

## 2015-03-23 MED FILL — ATORVASTATIN 20 MG TABLET: 20 | 90 days supply | Qty: 90 | Fill #0

## 2015-04-14 ENCOUNTER — Other Ambulatory Visit: Payer: Self-pay | Admitting: *Deleted

## 2015-04-14 ENCOUNTER — Ambulatory Visit: Payer: Self-pay | Admitting: *Deleted

## 2015-04-14 ENCOUNTER — Encounter: Payer: Self-pay | Admitting: *Deleted

## 2015-04-15 NOTE — Patient Outreach (Addendum)
[facility]) Care Management   04/14/15  Veronica Russell 1960/06/30 [id number]  Veronica Russell is an 55 y.o. female who presents to the [place] Management office for routine Link To Wellness follow up for self management assistance with Type II DM, HTN and hyperlipidemia.  Subjective:  Veronica Russell states she remains an active participant in the [facility] study for DM self management via electronic devices and is very pleased with her weight loss, increased activity and glycemic control. She says she was started on glimepiride when she saw Dr. Radene Ou in February. After initially taking only 1/2 of the dose to make sure she was not going to have hypoglycemia, she is now taking the prescribed dose of 1 mg twice daily. She is requesting that her Hgb A1C be checked today.  Objective:   Review of Systems  Constitutional: Negative.     Physical Exam  Constitutional: She is oriented to person, place, and time. She appears well-developed and well-nourished.  Respiratory: Effort normal.  Neurological: She is alert and oriented to person, place, and time.  Skin: Skin is warm and dry.  Psychiatric: She has a normal mood and affect. Her behavior is normal. Judgment and thought content normal.   Filed Weights   04/14/15 1708  Weight: 251 lb 9.6 oz (114.125 kg)   Filed Vitals:   04/14/15 1708  BP: 100/70  POC HGB A1C= 7.1%  Current Medications:   Current Outpatient Prescriptions  Medication Sig Dispense Refill  . amLODipine (NORVASC) 5 MG tablet Take 5 mg by mouth daily.    Marland Kitchen atorvastatin (LIPITOR) 20 MG tablet Take 20 mg by mouth daily.    . cholecalciferol (VITAMIN D) 1000 units tablet Take 1,000 Units by mouth daily.    . Clobetasol Propionate 0.05 % lotion Apply to affected areas of scalp 3-4 times per week    . glimepiride (AMARYL) 1 MG tablet Take 1 mg by mouth 2 (two) times daily with a meal.    .  lisinopril-hydrochlorothiazide (PRINZIDE,ZESTORETIC) 20-12.5 MG per tablet Take 1 tablet by mouth 2 (two) times daily.     . Melatonin 3 MG TABS Take 1 tablet by mouth at bedtime as needed.    . metFORMIN (GLUCOPHAGE-XR) 500 MG 24 hr tablet Take 500 mg by mouth 2 (two) times daily.    . prednisoLONE (PRELONE) 15 MG/5ML SOLN Take 15 mg by mouth.    . propranolol (INDERAL) 20 MG tablet Take 20 mg by mouth 2 (two) times daily.     . TRUE METRIX BLOOD GLUCOSE TEST test strip Testing twice daily at 8 am and 10:30 pm per [facility] trial  5  . azithromycin (ZITHROMAX Z-PAK) 250 MG tablet As directed (Patient not taking: Reported on 04/14/2015) 1 each 0  . finasteride (PROSCAR) 5 MG tablet Reported on 04/14/2015    . Vitamin D, Ergocalciferol, (DRISDOL) 50000 UNITS CAPS Take 50,000 Units by mouth. Reported on 04/14/2015     No current facility-administered medications for this visit.    Functional Status:   In your present state of health, do you have any difficulty performing the following activities: 04/14/2015 01/07/2015  Hearing? N N  Vision? N N  Difficulty concentrating or making decisions? N N  Walking or climbing stairs? N N  Dressing or bathing? - N  Doing errands, shopping? N N    Fall/Depression Screening:    PHQ 2/9 Scores 10/08/2014 07/02/2014  PHQ - 2 Score 0 0  Assessment:   [employer] employee and Link To Wellness member with Type II DM, HTN, hyperlipidemia and morbid obesity. Meeting treatment targets for HTN and lipids but Hgb A1C with slight increase of 7.1% today. Losing weight with help of [facility] trial.  Plan:  [facility] CM Care Plan Problem One        Most Recent Value   Care Plan Problem One  Veronica Russell is an active participant of the [place] trial that will last until the end of June 2017- Hgb A1C of today reflects slight increase in  Hgb A1C to 7.1%, morbid obesity-current weight loss of 5.7 lbs since January, HTN and hyperlipidemia currently meeting treatment targets as  evidenced by BP readings <140/<90 and normal lipid profile on 03/23/15    Role Documenting the Problem One  Care Management [place] for Problem One  Active   [facility] Long Term Goal (31-90 days)  Continued good control of Type II DM, HTN and Hyperlipidemia as evidenced by meeting treatment targets of  A1C<7.0%, BP<140/<90 and normal lipid profile at next check, weight loss of 15-20 lbs by end of June 2017    [facility] Long Term Goal Start Date  04/14/15   [facility] Long Term Goal Met Date     Interventions for Problem One Long Term Goal  Reviewed [facility] care plan and data related to blood sugar log, activity and weight, congratulated Veronica Russell on her 5.7 lb weight loss since the start of the trial in January, reviewed medications and medication adherence, reviewed mechanism of action of Metformin and glimepiride. strategies for ongoing good control of DM, HTN, hyperlipidemia discussed, discussed weight loss strategies, ensured patient is up to date on her health maintenance checks, ensured she has downloaded [employer] on her phone and knows how to use it, arranged for Link To Wellness follow up in June      RNCM to fax today's office visit note to Dr. Radene Ou. RNCM will meet quarterly and as needed with patient per Link To Wellness program guidelines to assist with Type II DM, HTN and hyperlipidemia self-management and assess patient's progress toward mutually set goals.  Barrington Ellison RN,CCM,CDE [place] Management Coordinator Link To Wellness Office Phone [phone] Office Fax [phone]

## 2015-04-26 DIAGNOSIS — M549 Dorsalgia, unspecified: Secondary | ICD-10-CM | POA: Diagnosis not present

## 2015-06-18 MED FILL — ATORVASTATIN 20 MG TABLET: 20 | 90 days supply | Qty: 90 | Fill #1

## 2015-06-18 MED FILL — AMLODIPINE BESYLATE 5 MG TA: 5 | 90 days supply | Qty: 90 | Fill #1

## 2015-06-18 MED FILL — PROPRANOLOL 20 MG TABLET: 20 | 90 days supply | Qty: 180 | Fill #1

## 2015-06-18 MED FILL — LISINOPRIL-HCTZ 20-12.5 MG: 20-12.5 | 90 days supply | Qty: 180 | Fill #1

## 2015-06-18 MED FILL — GLIMEPIRIDE 1 MG TABLET: 1 | 90 days supply | Qty: 180 | Fill #1

## 2015-06-18 MED FILL — METFORMIN HCL ER 500 MG TAB: 500 | 90 days supply | Qty: 180 | Fill #1

## 2015-07-21 ENCOUNTER — Encounter: Payer: Self-pay | Admitting: *Deleted

## 2015-07-21 ENCOUNTER — Other Ambulatory Visit: Payer: Self-pay | Admitting: *Deleted

## 2015-07-21 VITALS — BP 120/70 | Ht 64.5 in | Wt 258.8 lb

## 2015-07-21 DIAGNOSIS — E119 Type 2 diabetes mellitus without complications: Secondary | ICD-10-CM

## 2015-07-21 LAB — POCT GLYCOSYLATED HEMOGLOBIN (HGB A1C): HEMOGLOBIN A1C: 6.6

## 2015-07-21 LAB — POCT CBG (FASTING - GLUCOSE)-MANUAL ENTRY: Glucose Fasting, POC: 80 mg/dL (ref 70–99)

## 2015-07-21 NOTE — Patient Outreach (Signed)
[facility]) Care Management   07/21/2015  Veronica Russell 10/18/1960 [id number]  Veronica Russell is an 55 y.o. female who presents to the [place] Management office for routine Link To Wellness follow up for self management assistance with Type II DM, HTN, hyperlipidemia and obesity.  Subjective:  Veronica Russell states she remains an active participant in the [facility] study for Type II DM self management via electronic devices. She says the trial will end July 9th.  She also has started the [facility] exercise program and is working with a Physiological scientist 1-2 days per week for hour long sessions.  She is voicing concern about her weight gain and asked if it could be attributed to the glimepiride that she started in mid February. Veronica Russell says she is discouraged because she was losing weight prior to starting the glimepiride. She is asking if there are other medication options to treat her diabetes. She is requesting a POC Hgb A1C be assessed today.  Objective:   Review of Systems  Constitutional: Negative.     Physical Exam  Constitutional: She is oriented to person, place, and time. She appears well-developed and well-nourished.  Respiratory: Effort normal.  Neurological: She is alert and oriented to person, place, and time.  Skin: Skin is warm and dry.  Psychiatric: She has a normal mood and affect. Her behavior is normal. Judgment and thought content normal.   Filed Vitals:   07/21/15 1706  BP: 120/70   Filed Weights   07/21/15 1706  Weight: 258 lb 12.8 oz (117.391 kg)- reflects weight gain of  7 lbs since starting glimepiride   POC CBG= 80 POC Hgb A1C= 6.6%  Encounter Medications:   Outpatient Encounter Prescriptions as of 07/21/2015  Medication Sig Note  . amLODipine (NORVASC) 5 MG tablet Take 5 mg by mouth daily.   Marland Kitchen atorvastatin (LIPITOR) 20 MG tablet Take 20 mg by mouth daily.   . cholecalciferol (VITAMIN D)  1000 units tablet Take 1,000 Units by mouth daily. 04/14/2015: Takes 5000 units daily  . finasteride (PROSCAR) 5 MG tablet Reported on 04/14/2015 01/08/2015: Received from: [facility]  . glimepiride (AMARYL) 1 MG tablet Take 1 mg by mouth 2 (two) times daily with a meal.   . lisinopril-hydrochlorothiazide (PRINZIDE,ZESTORETIC) 20-12.5 MG per tablet Take 1 tablet by mouth 2 (two) times daily.    . Melatonin 3 MG TABS Take 1 tablet by mouth at bedtime as needed.   . metFORMIN (GLUCOPHAGE-XR) 500 MG 24 hr tablet Take 500 mg by mouth 2 (two) times daily. 04/15/2015: Now only taking 2 tablets daily since glimepiride was started on 03/25/15  . propranolol (INDERAL) 20 MG tablet Take 20 mg by mouth 2 (two) times daily.    . Vitamin D, Ergocalciferol, (DRISDOL) 50000 UNITS CAPS Take 50,000 Units by mouth. Reported on 04/14/2015 01/07/2015: 5000 units daily  . azithromycin (ZITHROMAX Z-PAK) 250 MG tablet As directed (Patient not taking: Reported on 04/14/2015)   . Clobetasol Propionate 0.05 % lotion Reported on 07/21/2015 01/08/2015: Received from: [facility]  . prednisoLONE (PRELONE) 15 MG/5ML SOLN Take 15 mg by mouth. Reported on 07/21/2015 04/14/2015: Swish and spit but don't swallow  . TRUE METRIX BLOOD GLUCOSE TEST test strip  01/08/2015: Received from: External Pharmacy   No facility-administered encounter medications on file as of 07/21/2015.    Functional Status:   In your present state of health, do you have any difficulty  performing the following activities: 07/21/2015 04/14/2015  Hearing? N N  Vision? N N  Difficulty concentrating or making decisions? N N  Walking or climbing stairs? - N  Dressing or bathing? - -  Doing errands, shopping? 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 Meeting treatment targets for HTN, hyperlipidemia and DM with POC Hgb A1C= 6.6% today. Gaining weight possibly secondary to glimepiride therapy.  Plan:   Veronica Russell CM Care Plan Problem One        Most Recent Value   Care Plan Problem One  Veronica Russell is an active participant of the Veronica Russell trial that will last until the end of July 9th 2017- Hgb A1C of today indicates improved glycemic control with  Hgb A1C now 6.6%, previously 7.1%, morbid obesity-current weight gain of 7 lbs since starting glimepiride in mid February which reversed her prior weight loss trend, HTN and hyperlipidemia currently meeting treatment targets as evidenced by consistent BP readings <140/<90 and normal lipid profile on 03/23/15   [copied from previous 07/02/2014 note]   Role Documenting the Problem One  Care Management Veronica Russell for Problem One  Active   [facility] Long Term Goal (31-90 days)  Continued good control of Type II DM, HTN and Hyperlipidemia as evidenced by meeting treatment targets of  A1C<7.0%, BP<140/<90 and normal lipid profile at next check, evidence of weight loss or no further weight gain at next assessment   [facility] Long Term Goal Start Date  07/21/15   Interventions for Problem One Long Term Goal  Reviewed [facility] care plan and data related to blood sugar log, activity, weight, and medication adherence, reviewed mechanism of action of Metformin and glimepiride, discussed possible causes of weight gain and discussed other medication options to treat DM  that do not have weight gain as a side effect specifically the classes of SGLT2 inhibitors and GLP1 agonists including the mechanism of action, common side effects, copays, and advised her to call for an appointment with Dr. Radene Ou to discuss, issued voucher for purchase of home blood pressure monitor and assigned Veronica Russell educations modules related to HTN and home monitoring, ensured patient is up to date on her  health maintenance checks, arranged for Link To Wellness follow up in September   [facility] CM Short Term Goal #1 (0-30 days)  Veronica Russell will call and make appointment to see Dr. Radene Ou to discuss other DM medication options that do not cause weight gain.   [facility] CM Short Term Goal #1 Start Date  07/21/15   Interventions for Short Term Goal #1  reviewed DM medications of Metformin and glimepiride including mechanism of action and common side effects, discussed other medication options that do not have weight gain as a side effect, provided handouts on GLP1 agonists and SGLT2 inhibitors including mechanism of action, dosage, route of administration and contraindications, encouraged Eli to call and make an appointment with her primary MD to discuss before her annual wellness exam on 10/28/15       RNCM to fax today's office visit note to Dr. Radene Ou. RNCM will meet quarterly and as needed with patient per Link To Wellness program guidelines to assist with Type II DM, HTN, hyperlipidemia and obesity self-management and  assess patient's progress toward mutually set goals. Barrington Ellison RN,CCM,CDE [place] Management Coordinator Link To Wellness Office Phone [phone] Office Fax [phone]

## 2015-08-12 ENCOUNTER — Other Ambulatory Visit: Payer: Self-pay | Admitting: Family Medicine

## 2015-08-12 DIAGNOSIS — Z1231 Encounter for screening mammogram for malignant neoplasm of breast: Secondary | ICD-10-CM

## 2015-09-04 DIAGNOSIS — H5213 Myopia, bilateral: Secondary | ICD-10-CM | POA: Diagnosis not present

## 2015-09-04 DIAGNOSIS — H52223 Regular astigmatism, bilateral: Secondary | ICD-10-CM | POA: Diagnosis not present

## 2015-09-04 DIAGNOSIS — E119 Type 2 diabetes mellitus without complications: Secondary | ICD-10-CM | POA: Diagnosis not present

## 2015-09-07 ENCOUNTER — Ambulatory Visit
Admission: RE | Admit: 2015-09-07 | Discharge: 2015-09-07 | Disposition: A | Discharge status: Home / Self Care | Payer: 59 | Source: Ambulatory Visit | Attending: Family Medicine | Admitting: Family Medicine

## 2015-09-07 DIAGNOSIS — Z1231 Encounter for screening mammogram for malignant neoplasm of breast: Secondary | ICD-10-CM | POA: Diagnosis not present

## 2015-10-20 ENCOUNTER — Ambulatory Visit: Payer: Self-pay | Admitting: *Deleted

## 2015-10-28 DIAGNOSIS — E559 Vitamin D deficiency, unspecified: Secondary | ICD-10-CM | POA: Diagnosis not present

## 2015-10-28 DIAGNOSIS — E119 Type 2 diabetes mellitus without complications: Secondary | ICD-10-CM | POA: Diagnosis not present

## 2015-10-28 DIAGNOSIS — R51 Headache: Secondary | ICD-10-CM | POA: Diagnosis not present

## 2015-10-28 DIAGNOSIS — I10 Essential (primary) hypertension: Secondary | ICD-10-CM | POA: Diagnosis not present

## 2015-10-28 DIAGNOSIS — Z6841 Body Mass Index (BMI) 40.0 and over, adult: Secondary | ICD-10-CM | POA: Diagnosis not present

## 2015-10-28 DIAGNOSIS — Z0001 Encounter for general adult medical examination with abnormal findings: Secondary | ICD-10-CM | POA: Diagnosis not present

## 2015-10-28 DIAGNOSIS — E7800 Pure hypercholesterolemia, unspecified: Secondary | ICD-10-CM | POA: Diagnosis not present

## 2015-10-28 DIAGNOSIS — Z7984 Long term (current) use of oral hypoglycemic drugs: Secondary | ICD-10-CM | POA: Diagnosis not present

## 2015-10-30 MED FILL — METFORMIN HCL ER 500 MG TAB: 500 | 90 days supply | Qty: 180 | Fill #0

## 2015-10-30 MED FILL — TRUE METRIX GLUCOSE TEST ST: 90 days supply | Qty: 100 | Fill #0

## 2015-10-30 MED FILL — PROPRANOLOL 20 MG TABLET: 20 | 90 days supply | Qty: 180 | Fill #0

## 2015-10-30 MED FILL — GLIMEPIRIDE 1 MG TABLET: 1 | 90 days supply | Qty: 180 | Fill #0

## 2015-10-30 MED FILL — AMLODIPINE BESYLATE 5 MG TA: 5 | 90 days supply | Qty: 90 | Fill #0

## 2015-10-30 MED FILL — ATORVASTATIN 20 MG TABLET: 20 | 90 days supply | Qty: 90 | Fill #0

## 2015-10-30 MED FILL — TRUEplus LANCETS 30G MISC: 90 days supply | Qty: 100 | Fill #0

## 2015-10-30 MED FILL — LISINOPRIL-HCTZ 20-12.5 MG: 20-12.5 | 90 days supply | Qty: 180 | Fill #0

## 2016-01-04 ENCOUNTER — Encounter (INDEPENDENT_AMBULATORY_CARE_PROVIDER_SITE_OTHER): Payer: Self-pay | Admitting: Family Medicine

## 2016-01-12 ENCOUNTER — Encounter: Payer: Self-pay | Admitting: *Deleted

## 2016-01-12 ENCOUNTER — Other Ambulatory Visit: Payer: Self-pay | Admitting: *Deleted

## 2016-01-12 VITALS — BP 120/72 | Ht 64.5 in | Wt 260.0 lb

## 2016-01-12 DIAGNOSIS — E119 Type 2 diabetes mellitus without complications: Secondary | ICD-10-CM

## 2016-01-12 LAB — POCT CBG (FASTING - GLUCOSE)-MANUAL ENTRY: Glucose Fasting, POC: 97 mg/dL (ref 70–99)

## 2016-01-12 LAB — POCT GLYCOSYLATED HEMOGLOBIN (HGB A1C): HEMOGLOBIN A1C: 6.9

## 2016-01-12 NOTE — Patient Outreach (Signed)
[facility]) Care Management   01/12/2016  BAYLI QUESINBERRY 1960-12-30 [id number]  GRACEYN FODOR is an 55 y.o. female who presents to the [place] Management office for routine Link To Wellness follow up for self management assistance with Type II DM, HTN, hyperlipidemia and obesity.  Subjective:  Yamilex states she has enrolled in the [facility] for Type II DM self management via electronic devices. She says she is still using the app as part of the beta trial.  She is voicing concern about her ongoing weight gain and attributes it partially to the glimepiride. She says she saw her primary care provider on 10/28/15 and was told she had protein in her urine and that she needs to better control her blood pressure and diabetes although she is meeting treatment goals for both, Her blood pressure has met goals since January 2013 and her Hgb A1C has been <7.0% until the 10/28/15 assessment when it was 7.1%.  She says she attended the information session for the [facility] at [facility] and will go for a consultation with Dr. Leafy Ro, the clinic's director on 01/27/16.  She is requesting a POC Hgb A1C be assessed today.  Objective:   Review of Systems  Constitutional: Negative.     Physical Exam  Constitutional: She is oriented to person, place, and time. She appears well-developed and well-nourished.  Respiratory: Effort normal.  Neurological: She is alert and oriented to person, place, and time.  Skin: Skin is warm and dry.  Psychiatric: She has a normal mood and affect. Her behavior is normal. Judgment and thought content normal.   Vitals:   01/12/16 1714  BP: 120/72   Filed Weights   07/21/15 1706  Weight: 258 lb 12.8 oz (117.391 kg)- reflects weight gain of  7 lbs since starting glimepiride   POC random CBG= 97 POC Hgb A1C= 6.9%  Encounter Medications:    Outpatient Encounter Prescriptions as of 01/12/2016  Medication Sig Note  . amLODipine (NORVASC) 5 MG tablet Take 5 mg by mouth daily.   Marland Kitchen atorvastatin (LIPITOR) 20 MG tablet Take 20 mg by mouth daily.   . cholecalciferol (VITAMIN D) 1000 units tablet Take 1,000 Units by mouth daily. 04/14/2015: Takes 5000 units daily  . Clobetasol Propionate 0.05 % lotion Reported on 07/21/2015 01/08/2015: Received from: [facility]  . glimepiride (AMARYL) 1 MG tablet Take 1 mg by mouth 2 (two) times daily with a meal. 07/22/2015: Started med on 03/28/15  . lisinopril-hydrochlorothiazide (PRINZIDE,ZESTORETIC) 20-12.5 MG per tablet Take 1 tablet by mouth 2 (two) times daily.    . Melatonin 3 MG TABS Take 1 tablet by mouth at bedtime as needed.   . metFORMIN (GLUCOPHAGE-XR) 500 MG 24 hr tablet Take 500 mg by mouth 2 (two) times daily. 04/15/2015: Now only taking 2 tablets daily since glimepiride was started on 03/25/15  . propranolol (INDERAL) 20 MG tablet Take 20 mg by mouth 2 (two) times daily.    . prednisoLONE (PRELONE) 15 MG/5ML SOLN Take 15 mg by mouth. Reported on 07/21/2015 04/14/2015: Swish and spit but don't swallow  . TRUE METRIX BLOOD GLUCOSE TEST test strip  01/08/2015: Received from: External Pharmacy  . Vitamin D, Ergocalciferol, (DRISDOL) 50000 UNITS CAPS Take 5000 Units by mouth. Reported on 04/14/2015 01/07/2015: 5000 units daily   No facility-administered encounter medications on file as of 01/12/2016.     Functional Status:  In your present state of health, do you have any difficulty performing the following activities: 01/12/2016 07/21/2015  Hearing? N N  Vision? N N  Difficulty concentrating or making decisions? N N  Walking or climbing stairs? N -  Dressing or bathing? N -  Doing errands, shopping? N N  Some recent data might be hidden    Fall/Depression Screening:    PHQ 2/9 Scores 01/12/2016 10/08/2014 07/02/2014  PHQ - 2 Score 0 0 0    Assessment:   [employer]  employee and Link To Wellness member and an active participant in the Beta [employer] Type II DM trial with Type II DM, HTN, hyperlipidemia and morbid obesity. Meeting treatment targets for HTN, hyperlipidemia and DM with POC Hgb A1C= 6.9% today. Gaining weight possibly secondary to glimepiride therapy.  Plan:   [facility] CM Care Plan Problem One        Most Recent Value   Care Plan Problem One  Type II DM with today's POC Hgb A1C= 6.9% previously 7.1%, morbid obesity with ongoing weight gain since starting glimepiride in mid February which reversed her prior weight loss trend- consulting with [facility] loss [facility], HTN and hyperlipidemia currently meeting treatment targets as evidenced by consistent BP readings <130/<80 and normal lipid profile on 10/28/15  except for LDL = 104   Role Documenting the Problem One  Care Management [place] for Problem One  Active   [facility] Long Term Goal (31-90 days)  Continued good control of Type II DM, HTN and Hyperlipidemia as evidenced by meeting treatment targets of  A1C<7.0%, BP<130/<80 and normal lipid profile at next check, evidence of weight loss or no further weight gain at next assessment and she will keep appointment with the weight loss clinic on 01/27/16, advised Akshaya this [facility] will research the possible reason for the protein in her urine since her blood pressure is in good control and her Hgb A1C is meeting treatment target chronically, Zyliah will actively participate in [place] in 2018    [place] Term Goal Start Date     Interventions for Problem One Long Term Goal  Reviewed medications and medication adherence, reviewed mechanism of action of Metformin and glimepiride, congratulated [name] on attending the information session and making a consultation appointment with the [facility] weigh loss clinic  ensured patient is up to date on her health maintenance checks, discussed [employer] "Go Live" and ensured Justine is enrolled, ensured she is  aware that disease management follow up will occur via [place] beginning in January 2018     RNCM to fax today's office visit note to Dr. Radene Ou.  Barrington Ellison RN,CCM,CDE [place] Management Coordinator Link To Wellness Office Phone [phone] Office Fax [phone]

## 2016-01-14 MED FILL — PREDNISOLONE 15 MG/5 ML SOL: 15 | 16 days supply | Qty: 240 | Fill #0

## 2016-01-21 ENCOUNTER — Telehealth: Payer: 59 | Admitting: Family

## 2016-01-21 DIAGNOSIS — R059 Cough, unspecified: Secondary | ICD-10-CM

## 2016-01-21 DIAGNOSIS — R05 Cough: Secondary | ICD-10-CM | POA: Diagnosis not present

## 2016-01-21 DIAGNOSIS — J069 Acute upper respiratory infection, unspecified: Secondary | ICD-10-CM | POA: Diagnosis not present

## 2016-01-21 MED ORDER — BENZONATATE 200 MG PO CAPS: 200.0000 mg | ORAL_CAPSULE | Freq: Two times a day (BID) | ORAL | 0 refills | Status: DC | PRN

## 2016-01-21 MED FILL — BENZONATATE 200 MG CAPSULE: 200 | 10 days supply | Qty: 20 | Fill #0

## 2016-01-21 MED FILL — GLIMEPIRIDE 1 MG TABLET: 1 | 90 days supply | Qty: 180 | Fill #1

## 2016-01-21 MED FILL — LISINOPRIL-HCTZ 20-12.5 MG: 20-12.5 | 90 days supply | Qty: 180 | Fill #1

## 2016-01-21 MED FILL — ATORVASTATIN 20 MG TABLET: 20 | 90 days supply | Qty: 90 | Fill #1

## 2016-01-21 MED FILL — PROPRANOLOL 20 MG TABLET: 20 | 90 days supply | Qty: 180 | Fill #1

## 2016-01-21 MED FILL — TRUEplus LANCETS 30G MISC: 90 days supply | Qty: 100 | Fill #1

## 2016-01-21 MED FILL — METFORMIN HCL ER 500 MG TAB: 500 | 90 days supply | Qty: 180 | Fill #1

## 2016-01-21 MED FILL — AMLODIPINE BESYLATE 5 MG TA: 5 | 90 days supply | Qty: 90 | Fill #1

## 2016-01-21 MED FILL — TRUE METRIX GLUCOSE TEST ST: 90 days supply | Qty: 100 | Fill #1

## 2016-01-21 NOTE — Progress Notes (Signed)
We are sorry that you are not feeling well.  Here is how we plan to help!  Based on what you have shared with me it looks like you have upper respiratory tract inflammation that has resulted in a significant cough.  Inflammation and infection in the upper respiratory tract is commonly called bronchitis and has four common causes:  Allergies, Viral Infections, Acid Reflux and Bacterial Infections.  Allergies, viruses and acid reflux are treated by controlling symptoms or eliminating the cause. An example might be a cough caused by taking certain blood pressure medications. You stop the cough by changing the medication. Another example might be a cough caused by acid reflux. Controlling the reflux helps control the cough.  Based on your presentation I believe you most likely have A cough due to a virus.  This is called viral bronchitis and is best treated by rest, plenty of fluids and control of the cough.  You may use Ibuprofen or Tylenol as directed to help your symptoms.     In addition you may use A prescription cough medication called Tessalon Perles 100mg. You may take 1-2 capsules every 8 hours as needed for your cough.  USE OF BRONCHODILATOR ("RESCUE") INHALERS: There is a risk from using your bronchodilator too frequently.  The risk is that over-reliance on a medication which only relaxes the muscles surrounding the breathing tubes can reduce the effectiveness of medications prescribed to reduce swelling and congestion of the tubes themselves.  Although you feel brief relief from the bronchodilator inhaler, your asthma may actually be worsening with the tubes becoming more swollen and filled with mucus.  This can delay other crucial treatments, such as oral steroid medications. If you need to use a bronchodilator inhaler daily, several times per day, you should discuss this with your provider.  There are probably better treatments that could be used to keep your asthma under control.     HOME  CARE . Only take medications as instructed by your medical team. . Complete the entire course of an antibiotic. . Drink plenty of fluids and get plenty of rest. . Avoid close contacts especially the very young and the elderly . Cover your mouth if you cough or cough into your sleeve. . Always remember to wash your hands . A steam or ultrasonic humidifier can help congestion.   GET HELP RIGHT AWAY IF: . You develop worsening fever. . You become short of breath . You cough up blood. . Your symptoms persist after you have completed your treatment plan MAKE SURE YOU   Understand these instructions.  Will watch your condition.  Will get help right away if you are not doing well or get worse.  Your e-visit answers were reviewed by a board certified advanced clinical practitioner to complete your personal care plan.  Depending on the condition, your plan could have included both over the counter or prescription medications. If there is a problem please reply  once you have received a response from your provider. Your safety is important to us.  If you have drug allergies check your prescription carefully.    You can use MyChart to ask questions about today's visit, request a non-urgent call back, or ask for a work or school excuse for 24 hours related to this e-Visit. If it has been greater than 24 hours you will need to follow up with your provider, or enter a new e-Visit to address those concerns. You will get an e-mail in the next two days   asking about your experience.  I hope that your e-visit has been valuable and will speed your recovery. Thank you for using e-visits.   

## 2016-01-24 DIAGNOSIS — J0100 Acute maxillary sinusitis, unspecified: Secondary | ICD-10-CM | POA: Diagnosis not present

## 2016-01-24 DIAGNOSIS — R05 Cough: Secondary | ICD-10-CM | POA: Diagnosis not present

## 2016-01-27 ENCOUNTER — Other Ambulatory Visit (INDEPENDENT_AMBULATORY_CARE_PROVIDER_SITE_OTHER): Payer: Self-pay | Admitting: Family Medicine

## 2016-01-27 ENCOUNTER — Encounter (INDEPENDENT_AMBULATORY_CARE_PROVIDER_SITE_OTHER): Payer: Self-pay | Admitting: Family Medicine

## 2016-01-27 ENCOUNTER — Ambulatory Visit (INDEPENDENT_AMBULATORY_CARE_PROVIDER_SITE_OTHER): Payer: 59 | Admitting: Family Medicine

## 2016-01-27 VITALS — BP 115/78 | HR 93 | Temp 97.7°F | Resp 18 | Ht 64.5 in | Wt 259.0 lb

## 2016-01-27 DIAGNOSIS — Z0289 Encounter for other administrative examinations: Secondary | ICD-10-CM

## 2016-01-27 DIAGNOSIS — Z1331 Encounter for screening for depression: Secondary | ICD-10-CM

## 2016-01-27 DIAGNOSIS — Z794 Long term (current) use of insulin: Secondary | ICD-10-CM | POA: Diagnosis not present

## 2016-01-27 DIAGNOSIS — E7800 Pure hypercholesterolemia, unspecified: Secondary | ICD-10-CM

## 2016-01-27 DIAGNOSIS — Z1389 Encounter for screening for other disorder: Secondary | ICD-10-CM

## 2016-01-27 DIAGNOSIS — R5383 Other fatigue: Secondary | ICD-10-CM | POA: Diagnosis not present

## 2016-01-27 DIAGNOSIS — Z9189 Other specified personal risk factors, not elsewhere classified: Secondary | ICD-10-CM | POA: Diagnosis not present

## 2016-01-27 DIAGNOSIS — I10 Essential (primary) hypertension: Secondary | ICD-10-CM

## 2016-01-27 DIAGNOSIS — E559 Vitamin D deficiency, unspecified: Secondary | ICD-10-CM

## 2016-01-27 DIAGNOSIS — E1165 Type 2 diabetes mellitus with hyperglycemia: Secondary | ICD-10-CM

## 2016-01-27 DIAGNOSIS — R0602 Shortness of breath: Secondary | ICD-10-CM | POA: Diagnosis not present

## 2016-01-27 NOTE — Progress Notes (Signed)
Office: [phone]  /  Fax: [phone]   HPI:   Chief Complaint: OBESITY  Veronica Russell (MR# [id number]) is a 55 y.o. female who presents on 01/27/2016 for obesity evaluation and treatment. Current BMI is 44.5. Veronica Russell has struggled with obesity for years and has been unsuccessful in either losing weight or maintaining long term weight loss. Veronica Russell attended our information session and states she is currently in the action stage of change and ready to dedicate time achieving and maintaining a healthier weight.  Veronica Russell states she is very busy with work and caring for both elderly parents with multiple health problems and isn't able to plan meals, often skips breakfast, eats out often, worse on weekends. Veronica Russell states she struggles with family and or coworkers weight loss sabotage her desired weight is 210 she started gaining weight in her 30's her heaviest weight ever was 265 lbs. she has significant food cravings issues  she skips meals frequently she is frequently drinking liquids with calories she frequently makes poor food choices she has binge eating behaviors she struggles with emotional eating    Fatigue Veronica Russell feels her energy is lower than it should be. This has worsened with weight gain and has not worsened recently. Veronica Russell admits to daytime somnolence and  admits to waking up still tired. Patient is at risk for obstructive sleep apnea. Patent has a history of symptoms of morning fatigue, morning headache and hypertension. Patient generally gets 4 hours of sleep per night, and states they generally have restless sleep. Snoring is present. Apneic episodes are not present. Epworth Sleepiness Score is 4.  Dyspnea on exertion Veronica Russell notes increasing shortness of breath with exercising and seems to be worsening over time with weight gain. She notes getting out of breath sooner with activity than she used to. This has not gotten worse recently. Veronica Russell denies orthopnea.  Diabetes II with  Hyperglycemia Veronica Russell has a diagnosis of diabetes type II with hyperglycemia episodes. Last A1c was 6.3, She has been working on intensive lifestyle modifications including diet, exercise, and weight loss to help control her blood glucose levels.  Hypertension Veronica Russell is a 55 y.o. female with hypertension.  She is on multiple medications and is taking her  medications as instructed, no medication side effects noted, no TIA's, no chest pain on exertion. She is working weight loss to help control her blood pressure with the goal of decreasing her BP medications and decreasing her risk of heart attack and stroke. [name] is currently controlled.  Hyperlipidemia Veronica Russell has hyperlipidemia, is currently on Lipitor and has been trying to improve her cholesterol levels with intensive lifestyle modification including a low saturated fat diet, exercise and weight loss. She denies any chest pain, claudication or myalgias.  Vitamin D deficiency Veronica Russell has a diagnosis of vitamin D deficiency. She is currently taking vit D and denies nausea, vomiting or muscle weakness.  Depression Screen Veronica Russell's Food and Mood (modified PHQ-9) score was  Depression screen PHQ 2/9 01/27/2016  Decreased Interest 1  Down, Depressed, Hopeless 0  PHQ - 2 Score 1  Altered sleeping 1  Tired, decreased energy 2  Change in appetite 2  Feeling bad or failure about yourself  0  Trouble concentrating 0  Moving slowly or fidgety/restless 0  Suicidal thoughts 0  PHQ-9 Score 6    ALLERGIES: Allergies  Allergen Reactions  . Milk-Related Compounds     Lactose intollerance    MEDICATIONS: Current Outpatient Prescriptions on File Prior to Visit  Medication Sig Dispense Refill  . amLODipine (NORVASC) 5 MG tablet Take 5 mg by mouth daily.    Marland Kitchen atorvastatin (LIPITOR) 20 MG tablet Take 20 mg by mouth daily.    . benzonatate (TESSALON) 200 MG capsule Take 1 capsule (200 mg total) by mouth 2 (two) times daily as  needed for cough. 20 capsule 0  . cholecalciferol (VITAMIN D) 1000 units tablet Take 1,000 Units by mouth daily.    . Clobetasol Propionate 0.05 % lotion Reported on 07/21/2015    . glimepiride (AMARYL) 1 MG tablet Take 1 mg by mouth 2 (two) times daily with a meal.    . lisinopril-hydrochlorothiazide (PRINZIDE,ZESTORETIC) 20-12.5 MG per tablet Take 1 tablet by mouth 2 (two) times daily.     . metFORMIN (GLUCOPHAGE-XR) 500 MG 24 hr tablet Take 500 mg by mouth 2 (two) times daily.    . prednisoLONE (PRELONE) 15 MG/5ML SOLN Take 15 mg by mouth. Reported on 07/21/2015    . propranolol (INDERAL) 20 MG tablet Take 20 mg by mouth 2 (two) times daily.     . TRUE METRIX BLOOD GLUCOSE TEST test strip   5  . azithromycin (ZITHROMAX Z-PAK) 250 MG tablet As directed (Patient not taking: Reported on 01/27/2016) 1 each 0  . finasteride (PROSCAR) 5 MG tablet Reported on 04/14/2015    . Melatonin 3 MG TABS Take 1 tablet by mouth at bedtime as needed.    . Vitamin D, Ergocalciferol, (DRISDOL) 50000 UNITS CAPS Take 50,000 Units by mouth. Reported on 04/14/2015     No current facility-administered medications on file prior to visit.     PAST MEDICAL HISTORY: Past Medical History:  Diagnosis Date  . Alopecia   . Asthma   . Back pain   . Diabetes mellitus ([place])   . GERD (gastroesophageal reflux disease)   . Hyperlipidemia   . Hypertension   . Joint pain   . Lactose intolerance   . Obesity   . Raynauds syndrome   . Swelling   . Tremor   . Vitamin D deficiency     PAST SURGICAL HISTORY: Past Surgical History:  Procedure Laterality Date  . ABDOMINAL HYSTERECTOMY    . MYOMECTOMY      SOCIAL HISTORY: Social History  Substance Use Topics  . Smoking status: Never Smoker  . Smokeless tobacco: Never Used  . Alcohol use Not on file    FAMILY HISTORY: Family History  Problem Relation Age of Onset  . Multiple myeloma Mother   . Hypertension Mother   . Hyperlipidemia Mother   . Cancer Mother   .  Obesity Mother   . Hypertension Father   . Hyperlipidemia Father   . Cancer Father   . Asthma Other   . Hyperlipidemia Other   . Hypertension Other   . Cancer Other   . COPD Other   . Stroke Other     ROS: Review of Systems  Constitutional: Positive for malaise/fatigue.       Trouble Sleeping  HENT: Positive for nosebleeds.   Eyes:       Wear Glasses or Contacts  Cardiovascular: Positive for orthopnea.       Very Cold Feet and Hands  Musculoskeletal: Positive for back pain, joint pain and myalgias.  Skin: Positive for rash.       Hair or Nail Changes  Neurological: Positive for tremors and weakness.    PHYSICAL EXAM: Blood pressure 115/78, pulse 93, temperature 97.7 F (36.5 C), temperature source Oral, resp.  rate 18, height 5' 4.5" (1.638 m), weight 259 lb (117.5 kg), last menstrual period 08/31/2008, SpO2 99 %. Body mass index is 43.77 kg/m. Physical Exam  Constitutional: She is oriented to person, place, and time. She appears well-developed and well-nourished.  Neurological: She is oriented to person, place, and time.    RECENT LABS AND TESTS: BMET    Component Value Date/Time   NA 136 09/10/2009 0540   K 3.6 09/10/2009 0540   CL 102 09/10/2009 0540   CO2 26 09/10/2009 0540   GLUCOSE 99 09/10/2009 0540   BUN 10 09/10/2009 0540   CREATININE 0.78 09/10/2009 0540   CALCIUM 8.6 09/10/2009 0540   GFRNONAA >60 09/10/2009 0540   GFRAA  09/10/2009 0540    >60        The eGFR has been calculated using the MDRD equation. This calculation has not been validated in all clinical situations. eGFR's persistently <60 mL/min signify possible Chronic Kidney Disease.   Lab Results  Component Value Date   HGBA1C 6.9 01/12/2016   No results found for: INSULIN CBC    Component Value Date/Time   WBC 11.1 (H) 09/10/2009 0540   RBC 3.89 09/10/2009 0540   HGB 11.3 (L) 09/10/2009 0540   HCT 34.2 (L) 09/10/2009 0540   PLT 241 09/10/2009 0540   MCV 87.8 09/10/2009 0540    MCH 29.0 09/10/2009 0540   MCHC 33.0 09/10/2009 0540   RDW 12.8 09/10/2009 0540   Iron/TIBC/Ferritin/ %Sat No results found for: IRON, TIBC, FERRITIN, IRONPCTSAT Lipid Panel  No results found for: CHOL, TRIG, HDL, CHOLHDL, VLDL, LDLCALC, LDLDIRECT Hepatic Function Panel  No results found for: PROT, ALBUMIN, AST, ALT, ALKPHOS, BILITOT, BILIDIR, IBILI No results found for: TSH  ECG  shows NSR with a rate of 88, 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    ASSESSMENT AND PLAN: Other fatigue - Plan: EKG 12-Lead, Comprehensive metabolic panel, CBC With Differential, Lipid Panel With LDL/HDL Ratio, VITAMIN D 25 Hydroxy (Vit-D Deficiency, Fractures), T3, T4, free, TSH, Vitamin B12, Microalbumin / creatinine urine ratio, Folate  Type 2 diabetes mellitus with hyperglycemia, with long-term current use of insulin (HCC) - Plan: Hemoglobin A1c, Insulin, random  Essential hypertension  Pure hypercholesterolemia - Plan: Lipid Panel With LDL/HDL Ratio  Vitamin D deficiency - Plan: VITAMIN D 25 Hydroxy (Vit-D Deficiency, Fractures)  Shortness of breath  At risk for heart disease  Depression screening  Obesity, morbid ([place])  PLAN:  Fatigue Emelyn was informed that her fatigue may be related to obesity, depression or many other causes. Labs will be ordered, and in the meanwhile Shameria has agreed to work on diet, exercise and weight loss to help with fatigue. Proper sleep hygiene was discussed including the need for 7-8 hours of quality sleep each night. A sleep study was not ordered based on symptoms and Epworth score.  Exercise Induced Shortness of Breath Jamie-Lee's shortness of breath appears to be obesity related and exercise induced. She has agreed to work on weight loss and gradually increase exercise to treat her exercise induced shortness of breath. If Tennelle follows our instructions and loses weight without improvement of her  shortness of breath, we will plan to refer to pulmonology. We will monitor this condition regularly. Michelene agrees to this plan.  Diabetes II Azusena has been given extensive diabetes education by myself today including ideal fasting and post-prandial blood glucose readings, individual ideal HgA1c goals  and  hypoglycemia prevention. We discussed the importance of good blood sugar control to decrease the likelihood of diabetic complications such as nephropathy, neuropathy, limb loss, blindness, coronary artery disease, and death. We discussed the importance of intensive lifestyle modification including diet, exercise and weight loss as the first line treatment for diabetes. Jemeka agrees to continue her diabetes medications and will follow up at the agreed upon time.  Hypertension We discussed sodium restriction, working on healthy weight loss, and a regular exercise program as the means to achieve improved blood pressure control. Estee agreed with this plan and agreed to follow up as directed. We will continue to monitor her blood pressure as well as her progress with the above lifestyle modifications. She will continue her medications as prescribed and will watch for signs of hypotension as he continues his lifestyle modifications.  Hyperlipidemia Yakelin was informed of the American Heart Association Guidelines emphasize intensive lifestyle modifications as the first line treatment for hyperlipidemia. We discussed many lifestyle modifications today in depth, and Shawnell will continue to work on decreasing saturated fats such as fatty red meat, butter and many fried foods. She will also increase vegetables and lean protein in her diet and continue to work on exercise and her weight loss efforts. Will check labs.  Vitamin D Deficiency Jerolene was informed that low vitamin D levels contributes to fatigue and are associated with obesity, breast, and colon cancer. She agrees to continue to take prescription Vit D  _0 ,000 IU daily, will check labs and will follow up for routine testing of vitamin D, at least 2-3 times per year. She was informed of the risk of over-replacement of vitamin D and agrees to not increase her dose unless he discusses this with [name] first.   Depression Screen Jenavi had a positive depression screening. Depression is commonly associated with obesity and often results in emotional eating behaviors. We will monitor this closely and work on CBT to help improve the non-hunger eating patterns. Referral to Psychology may be required if no improvement is seen as she continues in our clinic.  Risk counselling Cardiovascular risk counselling Jermiah was given extended (at least 15 minutes) coronary artery disease prevention counseling today. She is 55 y.o. female and has risk factors for heart disease including obesity. We discussed intensive lifestyle modifications today with an emphasis on specific weight loss instructions and strategies. Pt was also informed of the importance of increasing exercise and decreasing saturated fats to help prevent heart disease.  Obesity Taetum is currently in the action stage of change and her goal is to continue with weight loss efforts She has agreed to follow the Category 2 plan + 100 calories Zaliah has been instructed to work up to a goal of 150 minutes of combined cardio and strengthening exercise per week for weight loss and overall health benefits. We discussed the following Behavioral Modification Stratagies today: decrease eating out, dealing with family or coworker sabotage, emotional eating strategies, decrease snacking  and decrease liquid calories  Joanell has agreed to follow up with our clinic in 2 weeks. She was informed of the importance of frequent follow up visits to maximize her success with intensive lifestyle modifications for her multiple health conditions. She was informed we would discuss her lab results at her next visit unless there is a  critical issue that needs to be addressed sooner. Lovelyn agreed to keep her next visit at the agreed upon time to discuss these results.  Corey Skains, am acting as Education administrator for [employer]  Leafy Ro, MD  I have reviewed the above documentation for accuracy and completeness, and I agree with the above. -Dennard Nip, MD

## 2016-01-28 LAB — CBC WITH DIFFERENTIAL
BASOS: 0 %
Basophils Absolute: 0 10*3/uL (ref 0.0–0.2)
EOS (ABSOLUTE): 0.2 10*3/uL (ref 0.0–0.4)
Eos: 3 %
Hematocrit: 37 % (ref 34.0–46.6)
Hemoglobin: 12.1 g/dL (ref 11.1–15.9)
Immature Grans (Abs): 0 10*3/uL (ref 0.0–0.1)
Immature Granulocytes: 0 %
Lymphocytes Absolute: 2 10*3/uL (ref 0.7–3.1)
Lymphs: 30 %
MCH: 28.3 pg (ref 26.6–33.0)
MCHC: 32.7 g/dL (ref 31.5–35.7)
MCV: 86 fL (ref 79–97)
MONOS ABS: 0.6 10*3/uL (ref 0.1–0.9)
Monocytes: 9 %
NEUTROS ABS: 4 10*3/uL (ref 1.4–7.0)
NEUTROS PCT: 58 %
RBC: 4.28 x10E6/uL (ref 3.77–5.28)
RDW: 13.3 % (ref 12.3–15.4)
WBC: 6.8 10*3/uL (ref 3.4–10.8)

## 2016-01-28 LAB — COMPREHENSIVE METABOLIC PANEL
A/G RATIO: 1.4 (ref 1.2–2.2)
ALK PHOS: 96 IU/L (ref 39–117)
ALT: 22 IU/L (ref 0–32)
AST: 13 IU/L (ref 0–40)
Albumin: 4.2 g/dL (ref 3.5–5.5)
BUN / CREAT RATIO: 16 (ref 9–23)
BUN: 12 mg/dL (ref 6–24)
Bilirubin Total: 0.3 mg/dL (ref 0.0–1.2)
CO2: 23 mmol/L (ref 18–29)
CREATININE: 0.73 mg/dL (ref 0.57–1.00)
Calcium: 9.5 mg/dL (ref 8.7–10.2)
Chloride: 100 mmol/L (ref 96–106)
GFR calc Af Amer: 106 mL/min/{1.73_m2} (ref >59)
GFR calc non Af Amer: 92 mL/min/{1.73_m2} (ref >59)
GLOBULIN, TOTAL: 2.9 g/dL (ref 1.5–4.5)
Glucose: 112 mg/dL — ABNORMAL HIGH (ref 65–99)
Potassium: 4.4 mmol/L (ref 3.5–5.2)
SODIUM: 143 mmol/L (ref 134–144)
Total Protein: 7.1 g/dL (ref 6.0–8.5)

## 2016-01-28 LAB — T4, FREE: FREE T4: 1.17 ng/dL (ref 0.82–1.77)

## 2016-01-28 LAB — T3: T3 TOTAL: 135 ng/dL (ref 71–180)

## 2016-01-28 LAB — TSH: TSH: 0.425 u[IU]/mL — ABNORMAL LOW (ref 0.450–4.500)

## 2016-01-28 LAB — LIPID PANEL WITH LDL/HDL RATIO
Cholesterol, Total: 162 mg/dL (ref 100–199)
HDL: 51 mg/dL (ref >39)
LDL CALC: 84 mg/dL (ref 0–99)
LDL/HDL RATIO: 1.6 ratio (ref 0.0–3.2)
Triglycerides: 133 mg/dL (ref 0–149)
VLDL CHOLESTEROL CAL: 27 mg/dL (ref 5–40)

## 2016-01-28 LAB — VITAMIN D 25 HYDROXY (VIT D DEFICIENCY, FRACTURES): VIT D 25 HYDROXY: 48.8 ng/mL (ref 30.0–100.0)

## 2016-01-28 LAB — VITAMIN B12: Vitamin B-12: 771 pg/mL (ref 232–1245)

## 2016-01-28 LAB — HEMOGLOBIN A1C
Est. average glucose Bld gHb Est-mCnc: 146 mg/dL
Hgb A1c MFr Bld: 6.7 % — ABNORMAL HIGH (ref 4.8–5.6)

## 2016-01-28 LAB — FOLATE: Folate: >20 ng/mL (ref >3.0)

## 2016-01-28 LAB — MICROALBUMIN / CREATININE URINE RATIO
Creatinine, Urine: 203.2 mg/dL
Microalb/Creat Ratio: 6.5 mg/g creat (ref 0.0–30.0)
Microalbumin, Urine: 13.3 ug/mL

## 2016-01-28 LAB — INSULIN, RANDOM: INSULIN: 27.3 u[IU]/mL — ABNORMAL HIGH (ref 2.6–24.9)

## 2016-02-06 DIAGNOSIS — J029 Acute pharyngitis, unspecified: Secondary | ICD-10-CM | POA: Diagnosis not present

## 2016-02-09 ENCOUNTER — Ambulatory Visit (INDEPENDENT_AMBULATORY_CARE_PROVIDER_SITE_OTHER): Payer: 59 | Admitting: Family Medicine

## 2016-02-09 ENCOUNTER — Encounter (INDEPENDENT_AMBULATORY_CARE_PROVIDER_SITE_OTHER): Payer: Self-pay | Admitting: Family Medicine

## 2016-02-09 VITALS — BP 113/75 | HR 96 | Temp 99.3°F | Resp 16 | Ht 64.0 in | Wt 259.0 lb

## 2016-02-09 DIAGNOSIS — Z794 Long term (current) use of insulin: Secondary | ICD-10-CM | POA: Diagnosis not present

## 2016-02-09 DIAGNOSIS — E11649 Type 2 diabetes mellitus with hypoglycemia without coma: Secondary | ICD-10-CM | POA: Diagnosis not present

## 2016-02-09 NOTE — Progress Notes (Signed)
Office: [phone]  /  Fax: [phone]   HPI:   Chief Complaint: OBESITY Veronica Russell is here to discuss her progress with her obesity treatment plan. She is following her eating plan approximately 50 % of the time and states she is exercising 0 minutes 0 times per week. Her weight is 259 lb (117.5 kg) today and has not lost weight since her last visit. She has lost 0 lbs since starting treatment with [name]. Patient did well maintaining her weight over the christmas holiday even with a prednisone burst for bronchitis. Veronica Russell notes hunger is mostly controlled but she would like more options for breakfast.   Diabetes II Veronica Russell has a diagnosis of diabetes type II. Veronica Russell states BGs range between 60 and 128 and admits to any hypoglycemic episodes. Last A1c was Hemoglobin A1C Latest Ref Rng & Units 01/27/2016 01/12/2016 07/21/2015  HGBA1C 4.8 - 5.6 % 6.7(H) 6.9 6.6  Some recent data might be hidden    She has been working on intensive lifestyle modifications including diet, exercise, and weight loss to help control her blood glucose levels. She did have an episode of hypoglycemia on metformin and amaryl. Her blood sugar has steadily decreased since starting her eating plan even on prednisone.    Wt Readings from Last 500 Encounters:  02/09/16 259 lb (117.5 kg)  01/27/16 259 lb (117.5 kg)  01/12/16 260 lb (117.9 kg)  07/21/15 258 lb 12.8 oz (117.4 kg)  04/14/15 251 lb 9.6 oz (114.1 kg)  01/08/15 250 lb (113.4 kg)  01/07/15 251 lb 6.4 oz (114 kg)  10/08/14 252 lb (114.3 kg)  07/02/14 248 lb 6.4 oz (112.7 kg)  08/06/12 242 lb 4.8 oz (109.9 kg)  05/07/12 239 lb (108.4 kg)  02/06/12 245 lb 4.8 oz (111.3 kg)  11/02/11 249 lb 11.2 oz (113.3 kg)  09/19/11 250 lb (113.4 kg)  08/08/11 250 lb 8 oz (113.6 kg)     ALLERGIES: Allergies  Allergen Reactions  . Milk-Related Compounds     Lactose intollerance    MEDICATIONS: Current Outpatient Prescriptions on File Prior to Visit  Medication Sig  Dispense Refill  . amLODipine (NORVASC) 5 MG tablet Take 5 mg by mouth daily.    Marland Kitchen atorvastatin (LIPITOR) 20 MG tablet Take 20 mg by mouth daily.    . benzonatate (TESSALON) 200 MG capsule Take 1 capsule (200 mg total) by mouth 2 (two) times daily as needed for cough. 20 capsule 0  . cholecalciferol (VITAMIN D) 1000 units tablet Take 1,000 Units by mouth daily.    . Clobetasol Propionate 0.05 % lotion Reported on 07/21/2015    . glimepiride (AMARYL) 1 MG tablet Take 1 mg by mouth 1 day or 1 dose. One tab at night    . lisinopril-hydrochlorothiazide (PRINZIDE,ZESTORETIC) 20-12.5 MG per tablet Take 1 tablet by mouth 2 (two) times daily.     . metFORMIN (GLUCOPHAGE-XR) 500 MG 24 hr tablet Take 500 mg by mouth 2 (two) times daily.    . prednisoLONE (PRELONE) 15 MG/5ML SOLN Take 15 mg by mouth. Reported on 07/21/2015    . propranolol (INDERAL) 20 MG tablet Take 20 mg by mouth 2 (two) times daily.     . TRUE METRIX BLOOD GLUCOSE TEST test strip   5  . Melatonin 3 MG TABS Take 1 tablet by mouth at bedtime as needed.     No current facility-administered medications on file prior to visit.     PAST MEDICAL HISTORY: Past Medical History:  Diagnosis  Date  . Alopecia   . Asthma   . Back pain   . Diabetes mellitus ([place])   . GERD (gastroesophageal reflux disease)   . Hyperlipidemia   . Hypertension   . Joint pain   . Lactose intolerance   . Obesity   . Raynauds syndrome   . Swelling   . Tremor   . Vitamin D deficiency     PAST SURGICAL HISTORY: Past Surgical History:  Procedure Laterality Date  . ABDOMINAL HYSTERECTOMY    . MYOMECTOMY      SOCIAL HISTORY: Social History  Substance Use Topics  . Smoking status: Never Smoker  . Smokeless tobacco: Never Used  . Alcohol use Not on file    FAMILY HISTORY: Family History  Problem Relation Age of Onset  . Multiple myeloma Mother   . Hypertension Mother   . Hyperlipidemia Mother   . Cancer Mother   . Obesity Mother   . Hypertension  Father   . Hyperlipidemia Father   . Cancer Father   . Asthma Other   . Hyperlipidemia Other   . Hypertension Other   . Cancer Other   . COPD Other   . Stroke Other     ROS: Review of Systems  Endo/Heme/Allergies:       + hypoglycemia  All other systems reviewed and are negative.   PHYSICAL EXAM: Blood pressure 113/75, pulse 96, temperature 99.3 F (37.4 C), temperature source Oral, resp. rate 16, height 5' 4"  (1.626 m), weight 259 lb (117.5 kg), last menstrual period 08/31/2008, SpO2 95 %. Body mass index is 44.46 kg/m. Physical Exam  Constitutional: She appears well-developed and well-nourished.  Cardiovascular: Normal rate.   Pulmonary/Chest: Effort normal.  Musculoskeletal: Normal range of motion.  Skin: Skin is warm and dry.  Psychiatric: She has a normal mood and affect.  Vitals reviewed.   RECENT LABS AND TESTS: BMET    Component Value Date/Time   NA 143 01/27/2016 1006   K 4.4 01/27/2016 1006   CL 100 01/27/2016 1006   CO2 23 01/27/2016 1006   GLUCOSE 112 (H) 01/27/2016 1006   GLUCOSE 99 09/10/2009 0540   BUN 12 01/27/2016 1006   CREATININE 0.73 01/27/2016 1006   CALCIUM 9.5 01/27/2016 1006   GFRNONAA 92 01/27/2016 1006   GFRAA 106 01/27/2016 1006   Lab Results  Component Value Date   HGBA1C 6.7 (H) 01/27/2016   HGBA1C 6.9 01/12/2016   HGBA1C 6.6 07/21/2015   HGBA1C 6.8 01/07/2015   Lab Results  Component Value Date   INSULIN 27.3 (H) 01/27/2016   CBC    Component Value Date/Time   WBC 6.8 01/27/2016 1006   WBC 11.1 (H) 09/10/2009 0540   RBC 4.28 01/27/2016 1006   RBC 3.89 09/10/2009 0540   HGB 11.3 (L) 09/10/2009 0540   HCT 37.0 01/27/2016 1006   PLT 241 09/10/2009 0540   MCV 86 01/27/2016 1006   MCH 28.3 01/27/2016 1006   MCH 29.0 09/10/2009 0540   MCHC 32.7 01/27/2016 1006   MCHC 33.0 09/10/2009 0540   RDW 13.3 01/27/2016 1006   LYMPHSABS 2.0 01/27/2016 1006   EOSABS 0.2 01/27/2016 1006   BASOSABS 0.0 01/27/2016 1006    Iron/TIBC/Ferritin/ %Sat No results found for: IRON, TIBC, FERRITIN, IRONPCTSAT Lipid Panel     Component Value Date/Time   CHOL 162 01/27/2016 1006   TRIG 133 01/27/2016 1006   HDL 51 01/27/2016 1006   LDLCALC 84 01/27/2016 1006   Hepatic Function Panel  Component Value Date/Time   PROT 7.1 01/27/2016 1006   ALBUMIN 4.2 01/27/2016 1006   AST 13 01/27/2016 1006   ALT 22 01/27/2016 1006   ALKPHOS 96 01/27/2016 1006   BILITOT 0.3 01/27/2016 1006      Component Value Date/Time   TSH 0.425 (L) 01/27/2016 1006    ASSESSMENT AND PLAN: Type 2 diabetes mellitus with hypoglycemia without coma, with long-term current use of insulin (HCC)  Morbid obesity ([place])  PLAN:  Diabetes II Veronica Russell has been given extensive diabetes education by myself today including ideal fasting and post-prandial blood glucose readings, individual ideal HgA1c goals  and hypoglycemia prevention. We discussed the importance of good blood sugar control to decrease the likelihood of diabetic complications such as nephropathy, neuropathy, limb loss, blindness, coronary artery disease, and death. We discussed the importance of intensive lifestyle modification including diet, exercise and weight loss as the first line treatment for diabetes. Veronica Russell agrees to continue her diabetes medications and will follow up at the agreed upon time. She will continue on metformin for now and decrease the Amaryl to one pill at night and check her blood sugar twice daily.   Obesity Veronica Russell is currently in the action stage of change. As such, her goal is to continue with weight loss efforts She has agreed to keep a food journal with 200-400 calories and 18+ protein daily and follow the Category 2 plan Veronica Russell has been instructed to work up to a goal of 150 minutes of combined cardio and strengthening exercise per week for weight loss and overall health benefits. We discussed the following Behavioral Modification Stratagies today:  increasing lean protein intake, decreasing simple carbohydrates , increasing lower sugar fruits and avoiding temptations  Veronica Russell has agreed to follow up with our clinic in 2 weeks. She was informed of the importance of frequent follow up visits to maximize her success with intensive lifestyle modifications for her multiple health conditions.  I, April Moore, am acting as Education administrator for Dennard Nip, MD  I have reviewed the above documentation for accuracy and completeness, and I agree with the above. -Dennard Nip, MD

## 2016-02-22 ENCOUNTER — Ambulatory Visit (INDEPENDENT_AMBULATORY_CARE_PROVIDER_SITE_OTHER): Payer: 59 | Admitting: Family Medicine

## 2016-02-22 VITALS — BP 115/73 | HR 104 | Temp 98.5°F | Ht 64.0 in | Wt 258.0 lb

## 2016-02-22 DIAGNOSIS — E11649 Type 2 diabetes mellitus with hypoglycemia without coma: Secondary | ICD-10-CM

## 2016-02-22 MED ORDER — ALCOHOL WIPES 70 % PADS: 30.0000 | MEDICATED_PAD | Freq: Two times a day (BID) | 0 refills | Status: DC

## 2016-02-22 MED ORDER — INSULIN PEN NEEDLE 32G X 4 MM MISC: 1.0000 | Freq: Once | 0 refills | Status: AC

## 2016-02-22 MED ORDER — LIRAGLUTIDE 18 MG/3ML ~~LOC~~ SOPN: 0.6000 mg | PEN_INJECTOR | Freq: Once | SUBCUTANEOUS | 0 refills | Status: DC

## 2016-02-22 MED ORDER — ACCU-CHEK SAFE-T PRO LANCETS MISC
0 refills | Status: DC
Start: 2016-02-22 — End: 2016-04-20

## 2016-02-22 MED ORDER — GLUCOSE BLOOD VI STRP: ORAL_STRIP | 0 refills | Status: DC

## 2016-02-22 NOTE — Progress Notes (Signed)
Office: [phone]  /  Fax: [phone]   HPI:   Chief Complaint: OBESITY Veronica Russell is here to discuss her progress with her obesity treatment plan. She is following her eating plan approximately 80 % of the time and states she is exercising 0 minutes 0 times per week. She is still losing weight. Veronica Russell is currently struggling with journaling breakfast and admits to sometimes skipping meals.  Her weight is 258 lb (117 kg) today and has had a weight loss of 1 pound over a period of 2 weeks since her last visit. She has lost 1 lbs since starting treatment with [name].  Diabetes II Veronica Russell has a diagnosis of diabetes type II. Fasting blood sugars are running between 101-147 on Metformin and she decreased Amaryl due to hypoglycemic episodes, no further hypoglycemia but blood sugars are too high now to discontinue Amaryl. Last A1c was 6.7. She has been working on intensive lifestyle modifications including diet, exercise, and weight loss to help control her blood glucose levels.  Wt Readings from Last 500 Encounters:  02/22/16 258 lb (117 kg)  02/09/16 259 lb (117.5 kg)  01/27/16 259 lb (117.5 kg)  01/12/16 260 lb (117.9 kg)  07/21/15 258 lb 12.8 oz (117.4 kg)  04/14/15 251 lb 9.6 oz (114.1 kg)  01/08/15 250 lb (113.4 kg)  01/07/15 251 lb 6.4 oz (114 kg)  10/08/14 252 lb (114.3 kg)  07/02/14 248 lb 6.4 oz (112.7 kg)  08/06/12 242 lb 4.8 oz (109.9 kg)  05/07/12 239 lb (108.4 kg)  02/06/12 245 lb 4.8 oz (111.3 kg)  11/02/11 249 lb 11.2 oz (113.3 kg)  09/19/11 250 lb (113.4 kg)  08/08/11 250 lb 8 oz (113.6 kg)     ALLERGIES: Allergies  Allergen Reactions  . Milk-Related Compounds     Lactose intollerance    MEDICATIONS: Current Outpatient Prescriptions on File Prior to Visit  Medication Sig Dispense Refill  . amLODipine (NORVASC) 5 MG tablet Take 5 mg by mouth daily.    Marland Kitchen atorvastatin (LIPITOR) 20 MG tablet Take 20 mg by mouth daily.    . benzonatate (TESSALON) 200 MG capsule Take 1  capsule (200 mg total) by mouth 2 (two) times daily as needed for cough. 20 capsule 0  . cholecalciferol (VITAMIN D) 1000 units tablet Take 1,000 Units by mouth daily.    . Clobetasol Propionate 0.05 % lotion Reported on 07/21/2015    . glimepiride (AMARYL) 1 MG tablet Take 1 mg by mouth 1 day or 1 dose. One tab at night    . lisinopril-hydrochlorothiazide (PRINZIDE,ZESTORETIC) 20-12.5 MG per tablet Take 1 tablet by mouth 2 (two) times daily.     . Melatonin 3 MG TABS Take 1 tablet by mouth at bedtime as needed.    . metFORMIN (GLUCOPHAGE-XR) 500 MG 24 hr tablet Take 500 mg by mouth 2 (two) times daily.    . prednisoLONE (PRELONE) 15 MG/5ML SOLN Take 15 mg by mouth. Reported on 07/21/2015    . propranolol (INDERAL) 20 MG tablet Take 20 mg by mouth 2 (two) times daily.     . TRUE METRIX BLOOD GLUCOSE TEST test strip   5   No current facility-administered medications on file prior to visit.     PAST MEDICAL HISTORY: Past Medical History:  Diagnosis Date  . Alopecia   . Asthma   . Back pain   . Diabetes mellitus ([place])   . GERD (gastroesophageal reflux disease)   . Hyperlipidemia   . Hypertension   .  Joint pain   . Lactose intolerance   . Obesity   . Raynauds syndrome   . Swelling   . Tremor   . Vitamin D deficiency     PAST SURGICAL HISTORY: Past Surgical History:  Procedure Laterality Date  . ABDOMINAL HYSTERECTOMY    . MYOMECTOMY      SOCIAL HISTORY: Social History  Substance Use Topics  . Smoking status: Never Smoker  . Smokeless tobacco: Never Used  . Alcohol use Not on file    FAMILY HISTORY: Family History  Problem Relation Age of Onset  . Multiple myeloma Mother   . Hypertension Mother   . Hyperlipidemia Mother   . Cancer Mother   . Obesity Mother   . Hypertension Father   . Hyperlipidemia Father   . Cancer Father   . Asthma Other   . Hyperlipidemia Other   . Hypertension Other   . Cancer Other   . COPD Other   . Stroke Other     ROS: Review of  Systems  Constitutional: Positive for weight loss.    PHYSICAL EXAM: Blood pressure 115/73, pulse (!) 104, temperature 98.5 F (36.9 C), temperature source Oral, height '5\' 4"'$  (1.626 m), weight 258 lb (117 kg), last menstrual period 08/31/2008, SpO2 96 %. Body mass index is 44.29 kg/m. Physical Exam  Constitutional: She is oriented to person, place, and time. She appears well-developed and well-nourished.  Cardiovascular: Normal rate.   Pulmonary/Chest: Effort normal.  Musculoskeletal: Normal range of motion.  Neurological: She is oriented to person, place, and time.  Skin: Skin is warm and dry.  Psychiatric: She has a normal mood and affect. Her behavior is normal.  Vitals reviewed.   RECENT LABS AND TESTS: BMET    Component Value Date/Time   NA 143 01/27/2016 1006   K 4.4 01/27/2016 1006   CL 100 01/27/2016 1006   CO2 23 01/27/2016 1006   GLUCOSE 112 (H) 01/27/2016 1006   GLUCOSE 99 09/10/2009 0540   BUN 12 01/27/2016 1006   CREATININE 0.73 01/27/2016 1006   CALCIUM 9.5 01/27/2016 1006   GFRNONAA 92 01/27/2016 1006   GFRAA 106 01/27/2016 1006   Lab Results  Component Value Date   HGBA1C 6.7 (H) 01/27/2016   HGBA1C 6.9 01/12/2016   HGBA1C 6.6 07/21/2015   HGBA1C 6.8 01/07/2015   Lab Results  Component Value Date   INSULIN 27.3 (H) 01/27/2016   CBC    Component Value Date/Time   WBC 6.8 01/27/2016 1006   WBC 11.1 (H) 09/10/2009 0540   RBC 4.28 01/27/2016 1006   RBC 3.89 09/10/2009 0540   HGB 11.3 (L) 09/10/2009 0540   HCT 37.0 01/27/2016 1006   PLT 241 09/10/2009 0540   MCV 86 01/27/2016 1006   MCH 28.3 01/27/2016 1006   MCH 29.0 09/10/2009 0540   MCHC 32.7 01/27/2016 1006   MCHC 33.0 09/10/2009 0540   RDW 13.3 01/27/2016 1006   LYMPHSABS 2.0 01/27/2016 1006   EOSABS 0.2 01/27/2016 1006   BASOSABS 0.0 01/27/2016 1006   Iron/TIBC/Ferritin/ %Sat No results found for: IRON, TIBC, FERRITIN, IRONPCTSAT Lipid Panel     Component Value Date/Time   CHOL  162 01/27/2016 1006   TRIG 133 01/27/2016 1006   HDL 51 01/27/2016 1006   LDLCALC 84 01/27/2016 1006   Hepatic Function Panel     Component Value Date/Time   PROT 7.1 01/27/2016 1006   ALBUMIN 4.2 01/27/2016 1006   AST 13 01/27/2016 1006   ALT 22  01/27/2016 1006   ALKPHOS 96 01/27/2016 1006   BILITOT 0.3 01/27/2016 1006      Component Value Date/Time   TSH 0.425 (L) 01/27/2016 1006    ASSESSMENT AND PLAN: Type 2 diabetes mellitus with hypoglycemia without coma, without long-term current use of insulin (HCC) - Plan: Insulin Pen Needle 32G X 4 MM MISC, glucose blood (ACCU-CHEK GUIDE) test strip, Lancets (ACCU-CHEK SAFE-T PRO) lancets, Alcohol Swabs (ALCOHOL WIPES) 70 % PADS, liraglutide 18 MG/3ML SOPN  Morbid obesity (HCC)  PLAN:  Diabetes II Veronica Russell has been given extensive diabetes education by myself today including ideal fasting and post-prandial blood glucose readings, individual ideal HgA1c goals  and hypoglycemia prevention. We discussed the importance of good blood sugar control to decrease the likelihood of diabetic complications such as nephropathy, neuropathy, limb loss, blindness, coronary artery disease, and death. We discussed the importance of intensive lifestyle modification including diet, exercise and weight loss as the first line treatment for diabetes. Veronica Russell agrees to start Victoza 0.6 MG every morning #1 pen with no refills, Nano needles #100 daily with no refills, Accu check guide strips twice a day #100 with no refills and lancets, ETOH wipes and will follow up at the agreed upon time.  We spent > 50% of today's 30 minute visit on counseling and coordination of care documented in the above note.  Obesity Veronica Russell is currently in the action stage of change. As such, her goal is to continue with weight loss efforts She has agreed to keep a food journal with 200-400 calories and 18+ grams of protein daily and follow the Category 2 plan Veronica Russell has been instructed to  work up to a goal of 150 minutes of combined cardio and strengthening exercise per week for weight loss and overall health benefits. We discussed the following Behavioral Modification Stratagies today: increasing lean protein intake, decreasing simple carbohydrates , increasing vegetables and increasing lower sugar fruits   Veronica Russell has agreed to follow up with our clinic in 2 weeks. She was informed of the importance of frequent follow up visits to maximize her success with intensive lifestyle modifications for her multiple health conditions.  I, Doreene Nest, am acting as scribe for Dennard Nip, MD  I have reviewed the above documentation for accuracy and completeness, and I agree with the above. -Dennard Nip, MD

## 2016-02-23 ENCOUNTER — Encounter (INDEPENDENT_AMBULATORY_CARE_PROVIDER_SITE_OTHER): Payer: Self-pay | Admitting: Family Medicine

## 2016-02-23 MED FILL — VICTOZA 18 MG/3 ML INJECT P: 18 | 90 days supply | Qty: 9 | Fill #0

## 2016-02-23 MED FILL — SM ALCOHOL 70% PREP PADS: 70 | 50 days supply | Qty: 100 | Fill #0

## 2016-02-23 MED FILL — PREDNISOLONE 15 MG/5 ML SOL: 15 | 16 days supply | Qty: 240 | Fill #1

## 2016-02-23 MED FILL — UNIFINE PENTIPS 32GX5/32: 32G X 4 MM | 30 days supply | Qty: 100 | Fill #0

## 2016-02-23 MED FILL — UNIFINE PENTIPS 32GX5/32": 32G X 4 MM | 30 days supply | Qty: 100 | Fill #0

## 2016-03-07 ENCOUNTER — Ambulatory Visit (INDEPENDENT_AMBULATORY_CARE_PROVIDER_SITE_OTHER): Payer: 59 | Admitting: Family Medicine

## 2016-03-07 VITALS — BP 120/81 | HR 91 | Temp 98.4°F | Ht 64.0 in | Wt 257.0 lb

## 2016-03-07 DIAGNOSIS — E119 Type 2 diabetes mellitus without complications: Secondary | ICD-10-CM | POA: Diagnosis not present

## 2016-03-08 NOTE — Progress Notes (Signed)
Office: [phone]  /  Fax: [phone]   HPI:   Chief Complaint: OBESITY Veronica Russell is here to discuss her progress with her obesity treatment plan. She is following her eating plan approximately 75 % of the time and states she is exercising 0 minutes 0 times per week. Veronica Russell continues to lose weight even while travelling. She made better choices than previously and she is working on planning meals ahead of time. Her weight is 257 lb (116.6 kg) today and has had a weight loss of 1 pound over a period of 2 weeks since her last visit. She has lost 2 lbs since starting treatment with [name].  Diabetes II Veronica Russell has a diagnosis of diabetes type II. She started Victoza yesterday and has done well. Veronica Russell states fasting blood sugars range between 108 and 147 and denies any hypoglycemic episodes. She is not checking postprandial blood sugars. Last A1c was 6.7 She has been working on intensive lifestyle modifications including diet, exercise, and weight loss to help control her blood glucose levels.   Wt Readings from Last 500 Encounters:  03/07/16 257 lb (116.6 kg)  02/22/16 258 lb (117 kg)  02/09/16 259 lb (117.5 kg)  01/27/16 259 lb (117.5 kg)  01/12/16 260 lb (117.9 kg)  07/21/15 258 lb 12.8 oz (117.4 kg)  04/14/15 251 lb 9.6 oz (114.1 kg)  01/08/15 250 lb (113.4 kg)  01/07/15 251 lb 6.4 oz (114 kg)  10/08/14 252 lb (114.3 kg)  07/02/14 248 lb 6.4 oz (112.7 kg)  08/06/12 242 lb 4.8 oz (109.9 kg)  05/07/12 239 lb (108.4 kg)  02/06/12 245 lb 4.8 oz (111.3 kg)  11/02/11 249 lb 11.2 oz (113.3 kg)  09/19/11 250 lb (113.4 kg)  08/08/11 250 lb 8 oz (113.6 kg)     ALLERGIES: Allergies  Allergen Reactions  . Milk-Related Compounds     Lactose intollerance    MEDICATIONS: Current Outpatient Prescriptions on File Prior to Visit  Medication Sig Dispense Refill  . Alcohol Swabs (ALCOHOL WIPES) 70 % PADS 30 Packages by Does not apply route 2 (two) times daily. 100 each 0  . amLODipine (NORVASC)  5 MG tablet Take 5 mg by mouth daily.    Marland Kitchen atorvastatin (LIPITOR) 20 MG tablet Take 20 mg by mouth daily.    . cholecalciferol (VITAMIN D) 1000 units tablet Take 1,000 Units by mouth daily.    . Clobetasol Propionate 0.05 % lotion Reported on 07/21/2015    . glucose blood (ACCU-CHEK GUIDE) test strip Use twice daily 100 each 0  . Lancets (ACCU-CHEK SAFE-T PRO) lancets Use as instructed 100 each 0  . lisinopril-hydrochlorothiazide (PRINZIDE,ZESTORETIC) 20-12.5 MG per tablet Take 1 tablet by mouth 2 (two) times daily.     . Melatonin 3 MG TABS Take 1 tablet by mouth at bedtime as needed.    . metFORMIN (GLUCOPHAGE-XR) 500 MG 24 hr tablet Take 500 mg by mouth 2 (two) times daily.    . prednisoLONE (PRELONE) 15 MG/5ML SOLN Take 15 mg by mouth. Reported on 07/21/2015    . propranolol (INDERAL) 20 MG tablet Take 20 mg by mouth 2 (two) times daily.     Marland Kitchen liraglutide 18 MG/3ML SOPN Inject 0.1 mLs (0.6 mg total) into the skin once. Once every morning 0.1 mL 0   No current facility-administered medications on file prior to visit.     PAST MEDICAL HISTORY: Past Medical History:  Diagnosis Date  . Alopecia   . Asthma   . Back pain   .  Diabetes mellitus ([place])   . GERD (gastroesophageal reflux disease)   . Hyperlipidemia   . Hypertension   . Joint pain   . Lactose intolerance   . Obesity   . Raynauds syndrome   . Swelling   . Tremor   . Vitamin D deficiency     PAST SURGICAL HISTORY: Past Surgical History:  Procedure Laterality Date  . ABDOMINAL HYSTERECTOMY    . MYOMECTOMY      SOCIAL HISTORY: Social History  Substance Use Topics  . Smoking status: Never Smoker  . Smokeless tobacco: Never Used  . Alcohol use Not on file    FAMILY HISTORY: Family History  Problem Relation Age of Onset  . Multiple myeloma Mother   . Hypertension Mother   . Hyperlipidemia Mother   . Cancer Mother   . Obesity Mother   . Hypertension Father   . Hyperlipidemia Father   . Cancer Father   .  Asthma Other   . Hyperlipidemia Other   . Hypertension Other   . Cancer Other   . COPD Other   . Stroke Other     ROS: Review of Systems  Constitutional: Positive for weight loss.  Endo/Heme/Allergies:       Negative Hypoglycemia    PHYSICAL EXAM: Blood pressure 120/81, pulse 91, temperature 98.4 F (36.9 C), temperature source Oral, height _0  (1.626 m), weight 257 lb (116.6 kg), last menstrual period 08/31/2008, SpO2 97 %. Body mass index is 44.11 kg/m. Physical Exam  Constitutional: She is oriented to person, place, and time. She appears well-developed and well-nourished.  Cardiovascular: Normal rate.   Pulmonary/Chest: Effort normal.  Musculoskeletal: Normal range of motion.  Neurological: She is oriented to person, place, and time.  Skin: Skin is warm and dry.  Psychiatric: She has a normal mood and affect. Her behavior is normal.  Vitals reviewed.   RECENT LABS AND TESTS: BMET    Component Value Date/Time   NA 143 01/27/2016 1006   K 4.4 01/27/2016 1006   CL 100 01/27/2016 1006   CO2 23 01/27/2016 1006   GLUCOSE 112 (H) 01/27/2016 1006   GLUCOSE 99 09/10/2009 0540   BUN 12 01/27/2016 1006   CREATININE 0.73 01/27/2016 1006   CALCIUM 9.5 01/27/2016 1006   GFRNONAA 92 01/27/2016 1006   GFRAA 106 01/27/2016 1006   Lab Results  Component Value Date   HGBA1C 6.7 (H) 01/27/2016   HGBA1C 6.9 01/12/2016   HGBA1C 6.6 07/21/2015   HGBA1C 6.8 01/07/2015   Lab Results  Component Value Date   INSULIN 27.3 (H) 01/27/2016   CBC    Component Value Date/Time   WBC 6.8 01/27/2016 1006   WBC 11.1 (H) 09/10/2009 0540   RBC 4.28 01/27/2016 1006   RBC 3.89 09/10/2009 0540   HGB 11.3 (L) 09/10/2009 0540   HCT 37.0 01/27/2016 1006   PLT 241 09/10/2009 0540   MCV 86 01/27/2016 1006   MCH 28.3 01/27/2016 1006   MCH 29.0 09/10/2009 0540   MCHC 32.7 01/27/2016 1006   MCHC 33.0 09/10/2009 0540   RDW 13.3 01/27/2016 1006   LYMPHSABS 2.0 01/27/2016 1006   EOSABS 0.2  01/27/2016 1006   BASOSABS 0.0 01/27/2016 1006   Iron/TIBC/Ferritin/ %Sat No results found for: IRON, TIBC, FERRITIN, IRONPCTSAT Lipid Panel     Component Value Date/Time   CHOL 162 01/27/2016 1006   TRIG 133 01/27/2016 1006   HDL 51 01/27/2016 1006   LDLCALC 84 01/27/2016 1006   Hepatic Function  Panel     Component Value Date/Time   PROT 7.1 01/27/2016 1006   ALBUMIN 4.2 01/27/2016 1006   AST 13 01/27/2016 1006   ALT 22 01/27/2016 1006   ALKPHOS 96 01/27/2016 1006   BILITOT 0.3 01/27/2016 1006      Component Value Date/Time   TSH 0.425 (L) 01/27/2016 1006    ASSESSMENT AND PLAN: Type 2 diabetes mellitus without complication, without long-term current use of insulin (HCC)  Morbid obesity ([place])  PLAN:  Diabetes II Veronica Russell has been given extensive diabetes education by myself today including ideal fasting and post-prandial blood glucose readings, individual ideal HgA1c goals  and hypoglycemia prevention. We discussed the importance of good blood sugar control to decrease the likelihood of diabetic complications such as nephropathy, neuropathy, limb loss, blindness, coronary artery disease, and death. We discussed the importance of intensive lifestyle modification including diet, exercise and weight loss as the first line treatment for diabetes. Veronica Russell agrees to continue Victoza at 0.6 mg every morning and will follow up at the agreed upon time. We may need to increase her dose of Victoza at the next visit.  We spent > than 50% of the 15 minute visit on the counseling as documented in the note.  Obesity Veronica Russell is currently in the action stage of change. As such, her goal is to continue with weight loss efforts She has agreed to follow the Category 2 plan Veronica Russell has been instructed to work up to a goal of 150 minutes of combined cardio and strengthening exercise per week for weight loss and overall health benefits. We discussed the following Behavioral Modification Stratagies  today: increasing lean protein intake, decreasing simple carbohydrates , increasing vegetables and decrease eating out  Veronica Russell has agreed to follow up with our clinic in 2 weeks. She was informed of the importance of frequent follow up visits to maximize her success with intensive lifestyle modifications for her multiple health conditions.  I, Doreene Nest, am acting as scribe for Dennard Nip, MD  I have reviewed the above documentation for accuracy and completeness, and I agree with the above. -Dennard Nip, MD

## 2016-03-22 ENCOUNTER — Ambulatory Visit (INDEPENDENT_AMBULATORY_CARE_PROVIDER_SITE_OTHER): Payer: 59 | Admitting: Family Medicine

## 2016-03-22 VITALS — BP 105/69 | HR 102 | Temp 98.5°F | Ht 64.0 in | Wt 255.0 lb

## 2016-03-22 DIAGNOSIS — E119 Type 2 diabetes mellitus without complications: Secondary | ICD-10-CM

## 2016-03-22 MED ORDER — LIRAGLUTIDE 18 MG/3ML ~~LOC~~ SOPN: 1.2000 mg | PEN_INJECTOR | Freq: Every day | SUBCUTANEOUS | 0 refills | Status: DC

## 2016-03-22 NOTE — Progress Notes (Signed)
Office: [phone]  /  Fax: [phone]   HPI:   Chief Complaint: OBESITY Veronica Russell is here to discuss her progress with her obesity treatment plan. She is following her eating plan approximately 80 % of the time and states she is exercising 30 minutes 5 times per week. Patient continues to do well with weight loss, notes eating all her food especially at dinner and her hunger is controlled.   Her weight is 255 lb (115.7 kg) today and has had a weight loss of 2 pounds over a period of 2 weeks since her last visit. She has lost 4 lbs since starting treatment with [name].  Diabetes II Veronica Russell has a diagnosis of diabetes type II. Veronica Russell states her blood sugar has decreased and her FBGs range between 105 and 128 and denies any hypoglycemic episodes on Victoza 0.6 and notes midly decreasing polyphagia.  Last A1c was Hemoglobin A1C Latest Ref Rng & Units 01/27/2016 01/12/2016 07/21/2015  HGBA1C 4.8 - 5.6 % 6.7(H) 6.9 6.6  Some recent data might be hidden    She has been working on intensive lifestyle modifications including diet, exercise, and weight loss to help control her blood glucose levels.   Wt Readings from Last 500 Encounters:  03/22/16 255 lb (115.7 kg)  03/07/16 257 lb (116.6 kg)  02/22/16 258 lb (117 kg)  02/09/16 259 lb (117.5 kg)  01/27/16 259 lb (117.5 kg)  01/12/16 260 lb (117.9 kg)  07/21/15 258 lb 12.8 oz (117.4 kg)  04/14/15 251 lb 9.6 oz (114.1 kg)  01/08/15 250 lb (113.4 kg)  01/07/15 251 lb 6.4 oz (114 kg)  10/08/14 252 lb (114.3 kg)  07/02/14 248 lb 6.4 oz (112.7 kg)  08/06/12 242 lb 4.8 oz (109.9 kg)  05/07/12 239 lb (108.4 kg)  02/06/12 245 lb 4.8 oz (111.3 kg)  11/02/11 249 lb 11.2 oz (113.3 kg)  09/19/11 250 lb (113.4 kg)  08/08/11 250 lb 8 oz (113.6 kg)     ALLERGIES: Allergies  Allergen Reactions  . Milk-Related Compounds     Lactose intollerance    MEDICATIONS: Current Outpatient Prescriptions on File Prior to Visit  Medication Sig Dispense Refill    . Alcohol Swabs (ALCOHOL WIPES) 70 % PADS 30 Packages by Does not apply route 2 (two) times daily. 100 each 0  . amLODipine (NORVASC) 5 MG tablet Take 5 mg by mouth daily.    Marland Kitchen atorvastatin (LIPITOR) 20 MG tablet Take 20 mg by mouth daily.    . cholecalciferol (VITAMIN D) 1000 units tablet Take 1,000 Units by mouth daily.    . Clobetasol Propionate 0.05 % lotion Reported on 07/21/2015    . glucose blood (ACCU-CHEK GUIDE) test strip Use twice daily 100 each 0  . Lancets (ACCU-CHEK SAFE-T PRO) lancets Use as instructed 100 each 0  . lisinopril-hydrochlorothiazide (PRINZIDE,ZESTORETIC) 20-12.5 MG per tablet Take 1 tablet by mouth 2 (two) times daily.     . Melatonin 3 MG TABS Take 1 tablet by mouth at bedtime as needed.    . metFORMIN (GLUCOPHAGE-XR) 500 MG 24 hr tablet Take 500 mg by mouth 2 (two) times daily.    . prednisoLONE (PRELONE) 15 MG/5ML SOLN Take 15 mg by mouth. Reported on 07/21/2015    . propranolol (INDERAL) 20 MG tablet Take 20 mg by mouth 2 (two) times daily.      No current facility-administered medications on file prior to visit.     PAST MEDICAL HISTORY: Past Medical History:  Diagnosis Date  .  Alopecia   . Asthma   . Back pain   . Diabetes mellitus ([place])   . GERD (gastroesophageal reflux disease)   . Hyperlipidemia   . Hypertension   . Joint pain   . Lactose intolerance   . Obesity   . Raynauds syndrome   . Swelling   . Tremor   . Vitamin D deficiency     PAST SURGICAL HISTORY: Past Surgical History:  Procedure Laterality Date  . ABDOMINAL HYSTERECTOMY    . MYOMECTOMY      SOCIAL HISTORY: Social History  Substance Use Topics  . Smoking status: Never Smoker  . Smokeless tobacco: Never Used  . Alcohol use Not on file    FAMILY HISTORY: Family History  Problem Relation Age of Onset  . Multiple myeloma Mother   . Hypertension Mother   . Hyperlipidemia Mother   . Cancer Mother   . Obesity Mother   . Hypertension Father   . Hyperlipidemia Father    . Cancer Father   . Asthma Other   . Hyperlipidemia Other   . Hypertension Other   . Cancer Other   . COPD Other   . Stroke Other     ROS: Review of Systems  Constitutional: Positive for weight loss.  All other systems reviewed and are negative.   PHYSICAL EXAM: Blood pressure 105/69, pulse (!) 102, temperature 98.5 F (36.9 C), temperature source Oral, height _0  (1.626 m), weight 255 lb (115.7 kg), last menstrual period 08/31/2008, SpO2 97 %. Body mass index is 43.77 kg/m. Physical Exam  Constitutional: She is oriented to person, place, and time. She appears well-developed and well-nourished.  HENT:  Head: Normocephalic and atraumatic.  Eyes: Pupils are equal, round, and reactive to light.  Neck: Normal range of motion.  Cardiovascular: Normal rate.   Pulmonary/Chest: Effort normal.  Musculoskeletal: Normal range of motion.  Neurological: She is alert and oriented to person, place, and time.  Skin: Skin is warm and dry.  Psychiatric: She has a normal mood and affect. Her behavior is normal.  Vitals reviewed.   RECENT LABS AND TESTS: BMET    Component Value Date/Time   NA 143 01/27/2016 1006   K 4.4 01/27/2016 1006   CL 100 01/27/2016 1006   CO2 23 01/27/2016 1006   GLUCOSE 112 (H) 01/27/2016 1006   GLUCOSE 99 09/10/2009 0540   BUN 12 01/27/2016 1006   CREATININE 0.73 01/27/2016 1006   CALCIUM 9.5 01/27/2016 1006   GFRNONAA 92 01/27/2016 1006   GFRAA 106 01/27/2016 1006   Lab Results  Component Value Date   HGBA1C 6.7 (H) 01/27/2016   HGBA1C 6.9 01/12/2016   HGBA1C 6.6 07/21/2015   HGBA1C 6.8 01/07/2015   Lab Results  Component Value Date   INSULIN 27.3 (H) 01/27/2016   CBC    Component Value Date/Time   WBC 6.8 01/27/2016 1006   WBC 11.1 (H) 09/10/2009 0540   RBC 4.28 01/27/2016 1006   RBC 3.89 09/10/2009 0540   HGB 11.3 (L) 09/10/2009 0540   HCT 37.0 01/27/2016 1006   PLT 241 09/10/2009 0540   MCV 86 01/27/2016 1006   MCH 28.3 01/27/2016  1006   MCH 29.0 09/10/2009 0540   MCHC 32.7 01/27/2016 1006   MCHC 33.0 09/10/2009 0540   RDW 13.3 01/27/2016 1006   LYMPHSABS 2.0 01/27/2016 1006   EOSABS 0.2 01/27/2016 1006   BASOSABS 0.0 01/27/2016 1006   Iron/TIBC/Ferritin/ %Sat No results found for: IRON, TIBC, FERRITIN, IRONPCTSAT Lipid  Panel     Component Value Date/Time   CHOL 162 01/27/2016 1006   TRIG 133 01/27/2016 1006   HDL 51 01/27/2016 1006   LDLCALC 84 01/27/2016 1006   Hepatic Function Panel     Component Value Date/Time   PROT 7.1 01/27/2016 1006   ALBUMIN 4.2 01/27/2016 1006   AST 13 01/27/2016 1006   ALT 22 01/27/2016 1006   ALKPHOS 96 01/27/2016 1006   BILITOT 0.3 01/27/2016 1006      Component Value Date/Time   TSH 0.425 (L) 01/27/2016 1006    ASSESSMENT AND PLAN: Type 2 diabetes mellitus without complication, without long-term current use of insulin ([place]) - Plan: liraglutide 18 MG/3ML SOPN  Morbid obesity ([place])  PLAN: Diabetes II Phelicia has been given extensive diabetes education by myself today including ideal fasting and post-prandial blood glucose readings, individual ideal HgA1c goals  and hypoglycemia prevention. We discussed the importance of good blood sugar control to decrease the likelihood of diabetic complications such as nephropathy, neuropathy, limb loss, blindness, coronary artery disease, and death. We discussed the importance of intensive lifestyle modification including diet, exercise and weight loss as the first line treatment for diabetes. Gal agrees to continue her diabetes medications and will follow up at the agreed upon time. Will increase the Victoza to 0.9 every morning (5clicks past 0.6). A prescription was written today with 0 refills.   Obesity Emmily is currently in the action stage of change. As such, her goal is to continue with weight loss efforts She has agreed to follow the Category 2 plan [place] has been instructed to work up to a goal of 150 minutes of combined  cardio and strengthening exercise per week for weight loss and overall health benefits. We discussed the following Behavioral Modification Stratagies today: increasing lean protein intake, decreasing simple carbohydrates , increasing vegetables and increasing lower sugar fruits  Elaf has agreed to follow up with our clinic in 2 weeks. She was informed of the importance of frequent follow up visits to maximize her success with intensive lifestyle modifications for her multiple health conditions.  I, April Moore , am acting as Education administrator for Dennard Nip, MD  I have reviewed the above documentation for accuracy and completeness, and I agree with the above. -Dennard Nip, MD

## 2016-04-07 ENCOUNTER — Ambulatory Visit (INDEPENDENT_AMBULATORY_CARE_PROVIDER_SITE_OTHER): Payer: 59 | Admitting: Family Medicine

## 2016-04-07 ENCOUNTER — Encounter (INDEPENDENT_AMBULATORY_CARE_PROVIDER_SITE_OTHER): Payer: Self-pay | Admitting: Family Medicine

## 2016-04-07 VITALS — BP 92/71 | HR 108 | Temp 98.6°F | Resp 14 | Ht 64.0 in | Wt 253.0 lb

## 2016-04-07 DIAGNOSIS — E119 Type 2 diabetes mellitus without complications: Secondary | ICD-10-CM

## 2016-04-07 NOTE — Progress Notes (Signed)
Office: [phone]  /  Fax: [phone]   HPI:   Chief Complaint: OBESITY Veronica Russell is here to discuss her progress with her obesity treatment plan. She is following her eating plan approximately 75 % of the time and states she is walking 5000 steps for exercise 4 to 5 times per week. Veronica Russell is doing well with her weight loss efforts. She is tolerating category 2 plan well. She has had some increased emotional eating with recent funerals but has been able to get back on track quickly. She is walking for exercise well.  Her weight is 253 lb (114.8 kg) today and has had a weight loss of 2 pounds over a period of 2 weeks since her last visit. She has lost 6 lbs since starting treatment with [name].  Diabetes II Veronica Russell has a diagnosis of diabetes type II. Veronica Russell states fasting blood sugars are running between 96 to 126 and denies any hypoglycemic episodes. Last A1c was at 6.7  She has been working on intensive lifestyle modifications including diet, exercise, and weight loss to help control her blood glucose levels. She is on Metformin and Victoza 0.9 and doing well. She has decreased polyphagia.  Wt Readings from Last 500 Encounters:  04/07/16 253 lb (114.8 kg)  03/22/16 255 lb (115.7 kg)  03/07/16 257 lb (116.6 kg)  02/22/16 258 lb (117 kg)  02/09/16 259 lb (117.5 kg)  01/27/16 259 lb (117.5 kg)  01/12/16 260 lb (117.9 kg)  07/21/15 258 lb 12.8 oz (117.4 kg)  04/14/15 251 lb 9.6 oz (114.1 kg)  01/08/15 250 lb (113.4 kg)  01/07/15 251 lb 6.4 oz (114 kg)  10/08/14 252 lb (114.3 kg)  07/02/14 248 lb 6.4 oz (112.7 kg)  08/06/12 242 lb 4.8 oz (109.9 kg)  05/07/12 239 lb (108.4 kg)  02/06/12 245 lb 4.8 oz (111.3 kg)  11/02/11 249 lb 11.2 oz (113.3 kg)  09/19/11 250 lb (113.4 kg)  08/08/11 250 lb 8 oz (113.6 kg)     ALLERGIES: Allergies  Allergen Reactions  . Milk-Related Compounds     Lactose intollerance    MEDICATIONS: Current Outpatient Prescriptions on File Prior to Visit    Medication Sig Dispense Refill  . Alcohol Swabs (ALCOHOL WIPES) 70 % PADS 30 Packages by Does not apply route 2 (two) times daily. 100 each 0  . amLODipine (NORVASC) 5 MG tablet Take 5 mg by mouth daily.    Marland Kitchen atorvastatin (LIPITOR) 20 MG tablet Take 20 mg by mouth daily.    . cholecalciferol (VITAMIN D) 1000 units tablet Take 1,000 Units by mouth daily.    . Clobetasol Propionate 0.05 % lotion Reported on 07/21/2015    . glucose blood (ACCU-CHEK GUIDE) test strip Use twice daily 100 each 0  . Lancets (ACCU-CHEK SAFE-T PRO) lancets Use as instructed 100 each 0  . liraglutide 18 MG/3ML SOPN Inject 0.2 mLs (1.2 mg total) into the skin daily. 6 mL 0  . lisinopril-hydrochlorothiazide (PRINZIDE,ZESTORETIC) 20-12.5 MG per tablet Take 1 tablet by mouth 2 (two) times daily.     . Melatonin 3 MG TABS Take 1 tablet by mouth at bedtime as needed.    . metFORMIN (GLUCOPHAGE-XR) 500 MG 24 hr tablet Take 500 mg by mouth 2 (two) times daily.    . prednisoLONE (PRELONE) 15 MG/5ML SOLN Take 15 mg by mouth. Reported on 07/21/2015    . propranolol (INDERAL) 20 MG tablet Take 20 mg by mouth 2 (two) times daily.  No current facility-administered medications on file prior to visit.     PAST MEDICAL HISTORY: Past Medical History:  Diagnosis Date  . Alopecia   . Asthma   . Back pain   . Diabetes mellitus ([place])   . GERD (gastroesophageal reflux disease)   . Hyperlipidemia   . Hypertension   . Joint pain   . Lactose intolerance   . Obesity   . Raynauds syndrome   . Swelling   . Tremor   . Vitamin D deficiency     PAST SURGICAL HISTORY: Past Surgical History:  Procedure Laterality Date  . ABDOMINAL HYSTERECTOMY    . MYOMECTOMY      SOCIAL HISTORY: Social History  Substance Use Topics  . Smoking status: Never Smoker  . Smokeless tobacco: Never Used  . Alcohol use Not on file    FAMILY HISTORY: Family History  Problem Relation Age of Onset  . Multiple myeloma Mother   . Hypertension  Mother   . Hyperlipidemia Mother   . Cancer Mother   . Obesity Mother   . Hypertension Father   . Hyperlipidemia Father   . Cancer Father   . Asthma Other   . Hyperlipidemia Other   . Hypertension Other   . Cancer Other   . COPD Other   . Stroke Other     ROS: Review of Systems  Constitutional: Positive for weight loss.  Endo/Heme/Allergies:       Polyphagia Negative hypoglycemia    PHYSICAL EXAM: Blood pressure 92/71, pulse (!) 108, temperature 98.6 F (37 C), temperature source Oral, resp. rate 14, height '5\' 4"'$  (1.626 m), weight 253 lb (114.8 kg), last menstrual period 08/31/2008, SpO2 96 %. Body mass index is 43.43 kg/m. Physical Exam  Constitutional: She is oriented to person, place, and time. She appears well-developed and well-nourished.  Cardiovascular: Normal rate.   Pulmonary/Chest: Effort normal.  Musculoskeletal: Normal range of motion.  Neurological: She is oriented to person, place, and time.  Skin: Skin is warm and dry.  Psychiatric: She has a normal mood and affect. Her behavior is normal.  Vitals reviewed.   RECENT LABS AND TESTS: BMET    Component Value Date/Time   NA 143 01/27/2016 1006   K 4.4 01/27/2016 1006   CL 100 01/27/2016 1006   CO2 23 01/27/2016 1006   GLUCOSE 112 (H) 01/27/2016 1006   GLUCOSE 99 09/10/2009 0540   BUN 12 01/27/2016 1006   CREATININE 0.73 01/27/2016 1006   CALCIUM 9.5 01/27/2016 1006   GFRNONAA 92 01/27/2016 1006   GFRAA 106 01/27/2016 1006   Lab Results  Component Value Date   HGBA1C 6.7 (H) 01/27/2016   HGBA1C 6.9 01/12/2016   HGBA1C 6.6 07/21/2015   HGBA1C 6.8 01/07/2015   Lab Results  Component Value Date   INSULIN 27.3 (H) 01/27/2016   CBC    Component Value Date/Time   WBC 6.8 01/27/2016 1006   WBC 11.1 (H) 09/10/2009 0540   RBC 4.28 01/27/2016 1006   RBC 3.89 09/10/2009 0540   HGB 11.3 (L) 09/10/2009 0540   HCT 37.0 01/27/2016 1006   PLT 241 09/10/2009 0540   MCV 86 01/27/2016 1006   MCH  28.3 01/27/2016 1006   MCH 29.0 09/10/2009 0540   MCHC 32.7 01/27/2016 1006   MCHC 33.0 09/10/2009 0540   RDW 13.3 01/27/2016 1006   LYMPHSABS 2.0 01/27/2016 1006   EOSABS 0.2 01/27/2016 1006   BASOSABS 0.0 01/27/2016 1006   Iron/TIBC/Ferritin/ %Sat No results found  for: IRON, TIBC, FERRITIN, IRONPCTSAT Lipid Panel     Component Value Date/Time   CHOL 162 01/27/2016 1006   TRIG 133 01/27/2016 1006   HDL 51 01/27/2016 1006   LDLCALC 84 01/27/2016 1006   Hepatic Function Panel     Component Value Date/Time   PROT 7.1 01/27/2016 1006   ALBUMIN 4.2 01/27/2016 1006   AST 13 01/27/2016 1006   ALT 22 01/27/2016 1006   ALKPHOS 96 01/27/2016 1006   BILITOT 0.3 01/27/2016 1006      Component Value Date/Time   TSH 0.425 (L) 01/27/2016 1006    ASSESSMENT AND PLAN: Type 2 diabetes mellitus without complication, without long-term current use of insulin ([place])  Morbid obesity ([place])  PLAN:  Diabetes II Veronica Russell has been given extensive diabetes education by myself today including ideal fasting and post-prandial blood glucose readings, individual ideal HgA1c goals  and hypoglycemia prevention. We discussed the importance of good blood sugar control to decrease the likelihood of diabetic complications such as nephropathy, neuropathy, limb loss, blindness, coronary artery disease, and death. We discussed the importance of intensive lifestyle modification including diet, exercise and weight loss as the first line treatment for diabetes. Veronica Russell agrees to increase Victoza to 1.2 mg and check blood sugar. She agrees to continue Metformin and we will re-check labs in 2 weeks and will follow up at that time.  Obesity Veronica Russell is currently in the action stage of change. As such, her goal is to continue with weight loss efforts She has agreed to follow the Category 2 plan Veronica Russell has been instructed to work up to a goal of 150 minutes of combined cardio and strengthening exercise per week for weight loss  and overall health benefits. We discussed the following Behavioral Modification Stratagies today: increasing lean protein intake, increasing vegetables and dealing with family or coworker sabotage  Veronica Russell has agreed to follow up with our clinic in 2 weeks. She was informed of the importance of frequent follow up visits to maximize her success with intensive lifestyle modifications for her multiple health conditions.  I, Doreene Nest, am acting as scribe for Dennard Nip, MD  I have reviewed the above documentation for accuracy and completeness, and I agree with the above. -Dennard Nip, MD

## 2016-04-20 ENCOUNTER — Ambulatory Visit (INDEPENDENT_AMBULATORY_CARE_PROVIDER_SITE_OTHER): Payer: 59 | Admitting: Family Medicine

## 2016-04-20 VITALS — BP 120/78 | HR 97 | Temp 98.7°F | Ht 64.0 in | Wt 253.0 lb

## 2016-04-20 DIAGNOSIS — E119 Type 2 diabetes mellitus without complications: Secondary | ICD-10-CM | POA: Diagnosis not present

## 2016-04-20 DIAGNOSIS — E559 Vitamin D deficiency, unspecified: Secondary | ICD-10-CM

## 2016-04-20 DIAGNOSIS — Z794 Long term (current) use of insulin: Secondary | ICD-10-CM | POA: Diagnosis not present

## 2016-04-20 MED ORDER — ALCOHOL WIPES 70 % PADS: 30.0000 | MEDICATED_PAD | Freq: Two times a day (BID) | 0 refills | Status: DC

## 2016-04-20 MED ORDER — GLUCOSE BLOOD VI STRP: ORAL_STRIP | 0 refills | Status: DC

## 2016-04-20 MED ORDER — CALCIUM CARBONATE-VITAMIN D 500-200 MG-UNIT PO TABS: 2.0000 | ORAL_TABLET | Freq: Every morning | ORAL | 0 refills | Status: DC

## 2016-04-20 MED ORDER — ACCU-CHEK SAFE-T PRO LANCETS MISC: 0 refills | Status: DC

## 2016-04-20 MED ORDER — LIRAGLUTIDE 18 MG/3ML ~~LOC~~ SOPN: 1.2000 mg | PEN_INJECTOR | Freq: Every day | SUBCUTANEOUS | 0 refills | Status: DC

## 2016-04-20 MED FILL — ACCU-CHEK GUIDE TEST STRIP: 50 days supply | Qty: 100 | Fill #0

## 2016-04-20 MED FILL — SM ALCOHOL 70% PREP PADS: 70 | 50 days supply | Qty: 100 | Fill #0

## 2016-04-20 MED FILL — ACCU-CHEK FASTCLIX LANCETS: 50 days supply | Qty: 102 | Fill #0

## 2016-04-20 MED FILL — VICTOZA 2-PAK 18 MG/3 ML PE: 18 | 30 days supply | Qty: 6 | Fill #0

## 2016-04-20 NOTE — Progress Notes (Signed)
Office: [phone]  /  Fax: [phone]   HPI:   Chief Complaint: OBESITY Veronica Russell is here to discuss her progress with her obesity treatment plan. She is following her eating plan approximately 70 % of the time and states she is exercising 0 minutes 0 times per week. [name] has done well maintaining weight even with increased celebration and vacation eating. She has had increased stress with father's recent metastatic cancer diagnosis. Her weight is 253 lb (114.8 kg) today and has maintained weight over a period of 2 weeks since her last visit. She has lost 6 lbs since starting treatment with [name].  Vitamin D deficiency [name] has a diagnosis of vitamin D deficiency. She is not currently taking vitamin D. She is not yet at goal. [name] was put on OTC vitamin D 1,000 IU daily but ran out, so she is no longer taking. Fatigue improved. She denies, vomiting or muscle weakness.  Diabetes II [name] has a diagnosis of diabetes type II. [name] states fasting blood sugars 96 to 165, mostly 110 to 130's on Victoza and Metformin. She notes mild nausea if she eats too much but overall tolerating well. She denies any hypoglycemic episodes. Last A1c was at 6.7  She has been working on intensive lifestyle modifications including diet, exercise, and weight loss to help control her blood glucose levels.  Wt Readings from Last 500 Encounters:  04/20/16 253 lb (114.8 kg)  04/07/16 253 lb (114.8 kg)  03/22/16 255 lb (115.7 kg)  03/07/16 257 lb (116.6 kg)  02/22/16 258 lb (117 kg)  02/09/16 259 lb (117.5 kg)  01/27/16 259 lb (117.5 kg)  01/12/16 260 lb (117.9 kg)  07/21/15 258 lb 12.8 oz (117.4 kg)  04/14/15 251 lb 9.6 oz (114.1 kg)  01/08/15 250 lb (113.4 kg)  01/07/15 251 lb 6.4 oz (114 kg)  10/08/14 252 lb (114.3 kg)  07/02/14 248 lb 6.4 oz (112.7 kg)  08/06/12 242 lb 4.8 oz (109.9 kg)  05/07/12 239 lb (108.4 kg)  02/06/12 245 lb 4.8 oz (111.3 kg)  11/02/11 249 lb 11.2 oz (113.3 kg)  09/19/11 250 lb  (113.4 kg)  08/08/11 250 lb 8 oz (113.6 kg)     ALLERGIES: Allergies  Allergen Reactions  . Milk-Related Compounds     Lactose intollerance    MEDICATIONS: Current Outpatient Prescriptions on File Prior to Visit  Medication Sig Dispense Refill  . Alcohol Swabs (ALCOHOL WIPES) 70 % PADS 30 Packages by Does not apply route 2 (two) times daily. 100 each 0  . amLODipine (NORVASC) 5 MG tablet Take 5 mg by mouth daily.    Marland Kitchen atorvastatin (LIPITOR) 20 MG tablet Take 20 mg by mouth daily.    . cholecalciferol (VITAMIN D) 1000 units tablet Take 1,000 Units by mouth daily.    . Clobetasol Propionate 0.05 % lotion Reported on 07/21/2015    . glucose blood (ACCU-CHEK GUIDE) test strip Use twice daily 100 each 0  . Lancets (ACCU-CHEK SAFE-T PRO) lancets Use as instructed 100 each 0  . liraglutide 18 MG/3ML SOPN Inject 0.2 mLs (1.2 mg total) into the skin daily. 6 mL 0  . lisinopril-hydrochlorothiazide (PRINZIDE,ZESTORETIC) 20-12.5 MG per tablet Take 1 tablet by mouth 2 (two) times daily.     . Melatonin 3 MG TABS Take 1 tablet by mouth at bedtime as needed.    . metFORMIN (GLUCOPHAGE-XR) 500 MG 24 hr tablet Take 500 mg by mouth 2 (two) times daily.    . prednisoLONE (PRELONE) 15  MG/5ML SOLN Take 15 mg by mouth. Reported on 07/21/2015    . propranolol (INDERAL) 20 MG tablet Take 20 mg by mouth 2 (two) times daily.      No current facility-administered medications on file prior to visit.     PAST MEDICAL HISTORY: Past Medical History:  Diagnosis Date  . Alopecia   . Asthma   . Back pain   . Diabetes mellitus ([place])   . GERD (gastroesophageal reflux disease)   . Hyperlipidemia   . Hypertension   . Joint pain   . Lactose intolerance   . Obesity   . Raynauds syndrome   . Swelling   . Tremor   . Vitamin D deficiency     PAST SURGICAL HISTORY: Past Surgical History:  Procedure Laterality Date  . ABDOMINAL HYSTERECTOMY    . MYOMECTOMY      SOCIAL HISTORY: Social History  Substance  Use Topics  . Smoking status: Never Smoker  . Smokeless tobacco: Never Used  . Alcohol use Not on file    FAMILY HISTORY: Family History  Problem Relation Age of Onset  . Multiple myeloma Mother   . Hypertension Mother   . Hyperlipidemia Mother   . Cancer Mother   . Obesity Mother   . Hypertension Father   . Hyperlipidemia Father   . Cancer Father   . Asthma Other   . Hyperlipidemia Other   . Hypertension Other   . Cancer Other   . COPD Other   . Stroke Other     ROS: Review of Systems  Constitutional: Positive for malaise/fatigue. Negative for weight loss.  Gastrointestinal: Positive for nausea (mild). Negative for vomiting.  Musculoskeletal:       Negative muscle weakness  Endo/Heme/Allergies:       Negative hypoglycemia  Psychiatric/Behavioral:       Stress    PHYSICAL EXAM: Blood pressure 120/78, pulse 97, temperature 98.7 F (37.1 C), temperature source Oral, height 5' 4"  (1.626 m), weight 253 lb (114.8 kg), last menstrual period 08/31/2008, SpO2 97 %. Body mass index is 43.43 kg/m. Physical Exam  Constitutional: She is oriented to person, place, and time. She appears well-developed and well-nourished.  Cardiovascular: Normal rate.   Musculoskeletal: Normal range of motion.  Neurological: She is oriented to person, place, and time.  Skin: Skin is warm and dry.  Psychiatric: She has a normal mood and affect. Her behavior is normal.  Vitals reviewed.   RECENT LABS AND TESTS: BMET    Component Value Date/Time   NA 143 01/27/2016 1006   K 4.4 01/27/2016 1006   CL 100 01/27/2016 1006   CO2 23 01/27/2016 1006   GLUCOSE 112 (H) 01/27/2016 1006   GLUCOSE 99 09/10/2009 0540   BUN 12 01/27/2016 1006   CREATININE 0.73 01/27/2016 1006   CALCIUM 9.5 01/27/2016 1006   GFRNONAA 92 01/27/2016 1006   GFRAA 106 01/27/2016 1006   Lab Results  Component Value Date   HGBA1C 6.7 (H) 01/27/2016   HGBA1C 6.9 01/12/2016   HGBA1C 6.6 07/21/2015   HGBA1C 6.8  01/07/2015   Lab Results  Component Value Date   INSULIN 27.3 (H) 01/27/2016   CBC    Component Value Date/Time   WBC 6.8 01/27/2016 1006   WBC 11.1 (H) 09/10/2009 0540   RBC 4.28 01/27/2016 1006   RBC 3.89 09/10/2009 0540   HGB 11.3 (L) 09/10/2009 0540   HCT 37.0 01/27/2016 1006   PLT 241 09/10/2009 0540   MCV 86 01/27/2016  [place] 28.3 01/27/2016 1006   MCH 29.0 09/10/2009 0540   MCHC 32.7 01/27/2016 1006   MCHC 33.0 09/10/2009 0540   RDW 13.3 01/27/2016 1006   LYMPHSABS 2.0 01/27/2016 1006   EOSABS 0.2 01/27/2016 1006   BASOSABS 0.0 01/27/2016 1006   Iron/TIBC/Ferritin/ %Sat No results found for: IRON, TIBC, FERRITIN, IRONPCTSAT Lipid Panel     Component Value Date/Time   CHOL 162 01/27/2016 1006   TRIG 133 01/27/2016 1006   HDL 51 01/27/2016 1006   LDLCALC 84 01/27/2016 1006   Hepatic Function Panel     Component Value Date/Time   PROT 7.1 01/27/2016 1006   ALBUMIN 4.2 01/27/2016 1006   AST 13 01/27/2016 1006   ALT 22 01/27/2016 1006   ALKPHOS 96 01/27/2016 1006   BILITOT 0.3 01/27/2016 1006      Component Value Date/Time   TSH 0.425 (L) 01/27/2016 1006    ASSESSMENT AND PLAN: Type 2 diabetes mellitus without complication, with long-term current use of insulin ([facility]) - Plan: Lancets (ACCU-CHEK SAFE-T PRO) lancets, glucose blood (ACCU-CHEK GUIDE) test strip, liraglutide 18 MG/3ML SOPN, Alcohol Swabs (ALCOHOL WIPES) 70 % PADS  Vitamin D deficiency - Plan: calcium-vitamin D (OSCAL WITH D) 500-200 MG-UNIT tablet  Morbid obesity ([place])  PLAN:  Vitamin D Deficiency Bitania was informed that low vitamin D levels contributes to fatigue and are associated with obesity, breast, and colon cancer. She agrees to re-start OTC Vit D @ 1,000 IU every day and we will re-check labs in 2 weeks and will follow up for routine testing of vitamin D, at least 2-3 times per year. She was informed of the risk of over-replacement of vitamin D and agrees to not increase her dose  unless he discusses this with [name] first.  Diabetes II Khloie has been given extensive diabetes education by myself today including ideal fasting and post-prandial blood glucose readings, individual ideal Hgb A1c goals  and hypoglycemia prevention. We discussed the importance of good blood sugar control to decrease the likelihood of diabetic complications such as nephropathy, neuropathy, limb loss, blindness, coronary artery disease, and death. We discussed the importance of intensive lifestyle modification including diet, exercise and weight loss as the first line treatment for diabetes. Vyolet agrees to continue her diabetes medications and we will re-check labs in 2 weeks and she will follow up at the agreed upon time.We will refill Lancets 100, test strips 100, alcohol wipes 100 and Victoza 1.2 mg for 1 month.  Obesity Kinzlie is currently in the action stage of change. As such, her goal is to continue with weight loss efforts She has agreed to follow the Category 2 plan Aliah has been instructed to work up to a goal of 150 minutes of combined cardio and strengthening exercise per week for weight loss and overall health benefits. We discussed the following Behavioral Modification Stratagies today: increasing lean protein intake, decreasing simple carbohydrates , increasing lower sugar fruits and decrease eating out  Sabrinna has agreed to follow up with our clinic in 2 weeks. She was informed of the importance of frequent follow up visits to maximize her success with intensive lifestyle modifications for her multiple health conditions.  I, Doreene Nest, am acting as scribe for Dennard Nip, MD  I have reviewed the above documentation for accuracy and completeness, and I agree with the above. -Dennard Nip, MD

## 2016-04-26 DIAGNOSIS — Z7984 Long term (current) use of oral hypoglycemic drugs: Secondary | ICD-10-CM | POA: Diagnosis not present

## 2016-04-26 DIAGNOSIS — I10 Essential (primary) hypertension: Secondary | ICD-10-CM | POA: Diagnosis not present

## 2016-04-26 DIAGNOSIS — E119 Type 2 diabetes mellitus without complications: Secondary | ICD-10-CM | POA: Diagnosis not present

## 2016-04-26 DIAGNOSIS — E7800 Pure hypercholesterolemia, unspecified: Secondary | ICD-10-CM | POA: Diagnosis not present

## 2016-04-26 MED FILL — LISINOPRIL-HCTZ 20-12.5 MG: 20-12.5 | 90 days supply | Qty: 180 | Fill #0

## 2016-04-26 MED FILL — metFORMIN HCL 500 MG TABS: 500 | 90 days supply | Qty: 180 | Fill #0

## 2016-04-26 MED FILL — PROPRANOLOL 20 MG TABLET: 20 | 90 days supply | Qty: 180 | Fill #0

## 2016-04-26 MED FILL — AMLODIPINE BESYLATE 5 MG TA: 5 | 90 days supply | Qty: 90 | Fill #0

## 2016-04-26 MED FILL — ATORVASTATIN 20 MG TABLET: 20 | 90 days supply | Qty: 90 | Fill #0

## 2016-05-03 ENCOUNTER — Ambulatory Visit (INDEPENDENT_AMBULATORY_CARE_PROVIDER_SITE_OTHER): Payer: 59 | Admitting: Family Medicine

## 2016-05-03 VITALS — BP 105/71 | HR 96 | Temp 98.4°F | Ht 64.0 in | Wt 251.0 lb

## 2016-05-03 DIAGNOSIS — R946 Abnormal results of thyroid function studies: Secondary | ICD-10-CM

## 2016-05-03 DIAGNOSIS — E119 Type 2 diabetes mellitus without complications: Secondary | ICD-10-CM

## 2016-05-03 DIAGNOSIS — E559 Vitamin D deficiency, unspecified: Secondary | ICD-10-CM

## 2016-05-03 DIAGNOSIS — R7989 Other specified abnormal findings of blood chemistry: Secondary | ICD-10-CM

## 2016-05-03 NOTE — Progress Notes (Signed)
Office: [phone]  /  Fax: [phone]   HPI:   Chief Complaint: OBESITY Veronica Russell is here to discuss her progress with her obesity treatment plan. She is following her eating plan approximately 85 % of the time and states she is walking 2 times per week. Veronica Russell continues to do very well with weight loss. She has had some times with increased hunger especially in the afternoons. Her weight is 251 lb (113.9 kg) today and has had a weight loss of 2 pounds over a period of 2 weeks since her last visit. She has lost 8 lbs since starting treatment with [name].  Vitamin D deficiency Veronica Russell has a diagnosis of vitamin D deficiency, no recent labs. She is not taking vit D anymore. She admits fatigue and denies nausea, vomiting or muscle weakness.  Diabetes II Veronica Russell has a diagnosis of diabetes type II. Veronica Russell states fasting blood sugars between 97 and 132 and denies any hypoglycemic episodes. Last A1c was at 6.7 She has been working on intensive lifestyle modifications including diet, exercise, and weight loss to help control her blood glucose levels. She is doing well on diet, metformin and Victoza 1.2  Increased TSH Pt TSH was increased last labs and she notes heat intolerance but no palpitations or tremors.  Wt Readings from Last 500 Encounters:  05/03/16 251 lb (113.9 kg)  04/20/16 253 lb (114.8 kg)  04/07/16 253 lb (114.8 kg)  03/22/16 255 lb (115.7 kg)  03/07/16 257 lb (116.6 kg)  02/22/16 258 lb (117 kg)  02/09/16 259 lb (117.5 kg)  01/27/16 259 lb (117.5 kg)  01/12/16 260 lb (117.9 kg)  07/21/15 258 lb 12.8 oz (117.4 kg)  04/14/15 251 lb 9.6 oz (114.1 kg)  01/08/15 250 lb (113.4 kg)  01/07/15 251 lb 6.4 oz (114 kg)  10/08/14 252 lb (114.3 kg)  07/02/14 248 lb 6.4 oz (112.7 kg)  08/06/12 242 lb 4.8 oz (109.9 kg)  05/07/12 239 lb (108.4 kg)  02/06/12 245 lb 4.8 oz (111.3 kg)  11/02/11 249 lb 11.2 oz (113.3 kg)  09/19/11 250 lb (113.4 kg)  08/08/11 250 lb 8 oz (113.6 kg)      ALLERGIES: Allergies  Allergen Reactions   Milk-Related Compounds     Lactose intollerance    MEDICATIONS: Current Outpatient Prescriptions on File Prior to Visit  Medication Sig Dispense Refill   Alcohol Swabs (ALCOHOL WIPES) 70 % PADS 30 Packages by Does not apply route 2 (two) times daily. 100 each 0   amLODipine (NORVASC) 5 MG tablet Take 5 mg by mouth daily.     atorvastatin (LIPITOR) 20 MG tablet Take 20 mg by mouth daily.     calcium-vitamin D (OSCAL WITH D) 500-200 MG-UNIT tablet Take 2 tablets by mouth every morning. 60 tablet 0   cholecalciferol (VITAMIN D) 1000 units tablet Take 1,000 Units by mouth daily.     Clobetasol Propionate 0.05 % lotion Reported on 07/21/2015     glucose blood (ACCU-CHEK GUIDE) test strip Use twice daily 100 each 0   Lancets (ACCU-CHEK SAFE-T PRO) lancets Use as instructed 100 each 0   liraglutide 18 MG/3ML SOPN Inject 0.2 mLs (1.2 mg total) into the skin daily. 6 mL 0   lisinopril-hydrochlorothiazide (PRINZIDE,ZESTORETIC) 20-12.5 MG per tablet Take 1 tablet by mouth 2 (two) times daily.      Melatonin 3 MG TABS Take 1 tablet by mouth at bedtime as needed.     metFORMIN (GLUCOPHAGE-XR) 500 MG 24 hr tablet Take 500  mg by mouth 2 (two) times daily.     prednisoLONE (PRELONE) 15 MG/5ML SOLN Take 15 mg by mouth. Reported on 07/21/2015     propranolol (INDERAL) 20 MG tablet Take 20 mg by mouth 2 (two) times daily.      No current facility-administered medications on file prior to visit.     PAST MEDICAL HISTORY: Past Medical History:  Diagnosis Date   Alopecia    Asthma    Back pain    Diabetes mellitus (HCC)    GERD (gastroesophageal reflux disease)    Hyperlipidemia    Hypertension    Joint pain    Lactose intolerance    Obesity    Raynauds syndrome    Swelling    Tremor    Vitamin D deficiency     PAST SURGICAL HISTORY: Past Surgical History:  Procedure Laterality Date   ABDOMINAL HYSTERECTOMY      MYOMECTOMY      SOCIAL HISTORY: Social History  Substance Use Topics   Smoking status: Never Smoker   Smokeless tobacco: Never Used   Alcohol use Not on file    FAMILY HISTORY: Family History  Problem Relation Age of Onset   Multiple myeloma Mother    Hypertension Mother    Hyperlipidemia Mother    Cancer Mother    Obesity Mother    Hypertension Father    Hyperlipidemia Father    Cancer Father    Asthma Other    Hyperlipidemia Other    Hypertension Other    Cancer Other    COPD Other    Stroke Other     ROS: Review of Systems  Constitutional: Positive for malaise/fatigue and weight loss.  Gastrointestinal: Negative for nausea and vomiting.  Musculoskeletal:       Negative muscle weakness  Endo/Heme/Allergies:       Negative hypoglycemia    PHYSICAL EXAM: Blood pressure 105/71, pulse 96, temperature 98.4 F (36.9 C), temperature source Oral, height _0  (1.626 m), weight 251 lb (113.9 kg), last menstrual period 08/31/2008, SpO2 98 %. Body mass index is 43.08 kg/m. Physical Exam  Constitutional: She is oriented to person, place, and time. She appears well-developed and well-nourished.  Cardiovascular: Normal rate.   Pulmonary/Chest: Effort normal.  Musculoskeletal: Normal range of motion.  Neurological: She is oriented to person, place, and time.  Skin: Skin is warm and dry.  Psychiatric: She has a normal mood and affect. Her behavior is normal.  Vitals reviewed.   RECENT LABS AND TESTS: BMET    Component Value Date/Time   NA 143 01/27/2016 1006   K 4.4 01/27/2016 1006   CL 100 01/27/2016 1006   CO2 23 01/27/2016 1006   GLUCOSE 112 (H) 01/27/2016 1006   GLUCOSE 99 09/10/2009 0540   BUN 12 01/27/2016 1006   CREATININE 0.73 01/27/2016 1006   CALCIUM 9.5 01/27/2016 1006   GFRNONAA 92 01/27/2016 1006   GFRAA 106 01/27/2016 1006   Lab Results  Component Value Date   HGBA1C 6.7 (H) 01/27/2016   HGBA1C 6.9 01/12/2016   HGBA1C  6.6 07/21/2015   HGBA1C 6.8 01/07/2015   Lab Results  Component Value Date   INSULIN 27.3 (H) 01/27/2016   CBC    Component Value Date/Time   WBC 6.8 01/27/2016 1006   WBC 11.1 (H) 09/10/2009 0540   RBC 4.28 01/27/2016 1006   RBC 3.89 09/10/2009 0540   HGB 11.3 (L) 09/10/2009 0540   HCT 37.0 01/27/2016 1006   PLT 241 09/10/2009  0540   MCV 86 01/27/2016 1006   MCH 28.3 01/27/2016 1006   MCH 29.0 09/10/2009 0540   MCHC 32.7 01/27/2016 1006   MCHC 33.0 09/10/2009 0540   RDW 13.3 01/27/2016 1006   LYMPHSABS 2.0 01/27/2016 1006   EOSABS 0.2 01/27/2016 1006   BASOSABS 0.0 01/27/2016 1006   Iron/TIBC/Ferritin/ %Sat No results found for: IRON, TIBC, FERRITIN, IRONPCTSAT Lipid Panel     Component Value Date/Time   CHOL 162 01/27/2016 1006   TRIG 133 01/27/2016 1006   HDL 51 01/27/2016 1006   LDLCALC 84 01/27/2016 1006   Hepatic Function Panel     Component Value Date/Time   PROT 7.1 01/27/2016 1006   ALBUMIN 4.2 01/27/2016 1006   AST 13 01/27/2016 1006   ALT 22 01/27/2016 1006   ALKPHOS 96 01/27/2016 1006   BILITOT 0.3 01/27/2016 1006      Component Value Date/Time   TSH 0.425 (L) 01/27/2016 1006    ASSESSMENT AND PLAN: Type 2 diabetes mellitus without complication, without long-term current use of insulin ([place]) - Plan: Comprehensive metabolic panel, Hemoglobin A1c, Insulin, random, VITAMIN D 25 Hydroxy (Vit-D Deficiency, Fractures)  Vitamin D deficiency  Increased thyroid stimulating hormone (TSH) level - Plan: Comprehensive metabolic panel, VITAMIN D 25 Hydroxy (Vit-D Deficiency, Fractures), T3, T4, free, TSH  Morbid obesity (HCC)  PLAN:  Vitamin D Deficiency Keamber was informed that low vitamin D levels contributes to fatigue and are associated with obesity, breast, and colon cancer. She agrees to continue to take prescription Vit D _0 ,000 IU every week and we will check labs and will follow up for routine testing of vitamin D, at least 2-3 times per year.  She was informed of the risk of over-replacement of vitamin D and agrees to not increase her dose unless he discusses this with [name] first. Emy agrees to follow up with our clinic in 2 weeks.  Diabetes II Natsuko has been given extensive diabetes education by myself today including ideal fasting and post-prandial blood glucose readings, individual ideal Hgb A1c goals and hypoglycemia prevention. We discussed the importance of good blood sugar control to decrease the likelihood of diabetic complications such as nephropathy, neuropathy, limb loss, blindness, coronary artery disease, and death. We discussed the importance of intensive lifestyle modification including diet, exercise and weight loss as the first line treatment for diabetes. We will check labs and Devlynn agrees to continue her diabetes medications and we will discuss results at next visit in 2 weeks.  Elevated TSH Will recheck labs and discuss results at next visit.  Obesity Amerika is currently in the action stage of change. As such, her goal is to continue with weight loss efforts She has agreed to keep a food journal with 250 to 400 calories and 25+ grams protein daily and follow the Category 2 plan Darris has been instructed to work up to a goal of 150 minutes of combined cardio and strengthening exercise per week for weight loss and overall health benefits. We discussed the following Behavioral Modification Stratagies today: no skipping meals, increasing lean protein intake and work on meal planning and easy cooking plans  Adaya has agreed to follow up with our clinic in 2 weeks. She was informed of the importance of frequent follow up visits to maximize her success with intensive lifestyle modifications for her multiple health conditions.  I, Doreene Nest, am acting as scribe for Dennard Nip, MD  I have reviewed the above documentation for accuracy and completeness, and I agree  with the above. -Dennard Nip, MD

## 2016-05-04 ENCOUNTER — Encounter (INDEPENDENT_AMBULATORY_CARE_PROVIDER_SITE_OTHER): Payer: Self-pay | Admitting: Family Medicine

## 2016-05-05 ENCOUNTER — Ambulatory Visit (INDEPENDENT_AMBULATORY_CARE_PROVIDER_SITE_OTHER): Payer: 59 | Admitting: Family Medicine

## 2016-05-09 ENCOUNTER — Other Ambulatory Visit (INDEPENDENT_AMBULATORY_CARE_PROVIDER_SITE_OTHER): Payer: Self-pay

## 2016-05-09 DIAGNOSIS — E119 Type 2 diabetes mellitus without complications: Secondary | ICD-10-CM | POA: Diagnosis not present

## 2016-05-09 DIAGNOSIS — R946 Abnormal results of thyroid function studies: Secondary | ICD-10-CM | POA: Diagnosis not present

## 2016-05-09 DIAGNOSIS — R7989 Other specified abnormal findings of blood chemistry: Secondary | ICD-10-CM

## 2016-05-09 DIAGNOSIS — E559 Vitamin D deficiency, unspecified: Secondary | ICD-10-CM

## 2016-05-09 NOTE — Addendum Note (Signed)
Addended by: Dewayne Shorter L on: 05/09/2016 10:00 AM   Modules accepted: Orders

## 2016-05-10 LAB — COMPREHENSIVE METABOLIC PANEL
ALT: 22 IU/L (ref 0–32)
AST: 16 IU/L (ref 0–40)
Albumin/Globulin Ratio: 1.5 (ref 1.2–2.2)
Albumin: 4.1 g/dL (ref 3.5–5.5)
Alkaline Phosphatase: 90 IU/L (ref 39–117)
BUN/Creatinine Ratio: 21 (ref 9–23)
BUN: 15 mg/dL (ref 6–24)
Bilirubin Total: 0.3 mg/dL (ref 0.0–1.2)
CALCIUM: 10 mg/dL (ref 8.7–10.2)
CO2: 25 mmol/L (ref 18–29)
CREATININE: 0.72 mg/dL (ref 0.57–1.00)
Chloride: 100 mmol/L (ref 96–106)
GFR, EST AFRICAN AMERICAN: 108 mL/min/{1.73_m2} (ref >59)
GFR, EST NON AFRICAN AMERICAN: 94 mL/min/{1.73_m2} (ref >59)
GLUCOSE: 108 mg/dL — AB (ref 65–99)
Globulin, Total: 2.8 g/dL (ref 1.5–4.5)
Potassium: 4.3 mmol/L (ref 3.5–5.2)
Sodium: 141 mmol/L (ref 134–144)
TOTAL PROTEIN: 6.9 g/dL (ref 6.0–8.5)

## 2016-05-10 LAB — TSH: TSH: 0.443 u[IU]/mL — ABNORMAL LOW (ref 0.450–4.500)

## 2016-05-10 LAB — T4, FREE: Free T4: 1.24 ng/dL (ref 0.82–1.77)

## 2016-05-10 LAB — HEMOGLOBIN A1C
ESTIMATED AVERAGE GLUCOSE: 140 mg/dL
Hgb A1c MFr Bld: 6.5 % — ABNORMAL HIGH (ref 4.8–5.6)

## 2016-05-10 LAB — INSULIN, RANDOM: INSULIN: 38.5 u[IU]/mL — AB (ref 2.6–24.9)

## 2016-05-10 LAB — VITAMIN D 25 HYDROXY (VIT D DEFICIENCY, FRACTURES): VIT D 25 HYDROXY: 49.2 ng/mL (ref 30.0–100.0)

## 2016-05-10 LAB — T3: T3 TOTAL: 117 ng/dL (ref 71–180)

## 2016-05-23 DIAGNOSIS — J029 Acute pharyngitis, unspecified: Secondary | ICD-10-CM | POA: Diagnosis not present

## 2016-05-23 DIAGNOSIS — J020 Streptococcal pharyngitis: Secondary | ICD-10-CM | POA: Diagnosis not present

## 2016-05-23 MED FILL — AMOXICILLIN 875 MG TABLET: 875 | 10 days supply | Qty: 20 | Fill #0

## 2016-05-25 ENCOUNTER — Ambulatory Visit (INDEPENDENT_AMBULATORY_CARE_PROVIDER_SITE_OTHER): Payer: 59 | Admitting: Family Medicine

## 2016-05-25 VITALS — BP 108/74 | HR 97 | Temp 98.8°F | Ht 64.0 in | Wt 249.0 lb

## 2016-05-25 DIAGNOSIS — Z794 Long term (current) use of insulin: Secondary | ICD-10-CM | POA: Diagnosis not present

## 2016-05-25 DIAGNOSIS — E119 Type 2 diabetes mellitus without complications: Secondary | ICD-10-CM

## 2016-05-25 MED ORDER — INSULIN PEN NEEDLE 32G X 4 MM MISC: 1.0000 | Freq: Every day | 0 refills | Status: DC

## 2016-05-25 MED ORDER — LIRAGLUTIDE 18 MG/3ML ~~LOC~~ SOPN: 1.2000 mg | PEN_INJECTOR | Freq: Every day | SUBCUTANEOUS | 0 refills | Status: DC

## 2016-05-25 MED FILL — UNIFINE PENTIPS 32GX5/32": 32G X 4 MM | 90 days supply | Qty: 100 | Fill #0

## 2016-05-25 MED FILL — VICTOZA 2-PAK 18 MG/3 ML PE: 18 | 30 days supply | Qty: 6 | Fill #0

## 2016-05-25 MED FILL — UNIFINE PENTIPS 32GX5/32: 32G X 4 MM | 90 days supply | Qty: 100 | Fill #0

## 2016-05-25 NOTE — Progress Notes (Signed)
Office: [phone]  /  Fax: [phone]   HPI:   Chief Complaint: OBESITY Veronica Russell is here to discuss her progress with her obesity treatment plan. She is following her eating plan approximately 75 % of the time and states she is doing a lot of walking with dad in hospital 5 times per week. Veronica Russell continues to do well with weight loss, has had lots of eating challenges with father in the hospital. Her weight is 249 lb (112.9 kg) today and has had a weight loss of 2 pounds over a period of 3 weeks since her last visit. She has lost 10 lbs since starting treatment with [name].  Diabetes II Veronica Russell has a diagnosis of diabetes type II. Veronica Russell states BGs range between 99 and 117, 2 hour post prandial mostly 100 and 140's and denies any hypoglycemic episodes. Last A1c was at 6.5 She has been working on intensive lifestyle modifications including diet, exercise, and weight loss to help control her blood glucose levels.  Wt Readings from Last 500 Encounters:  05/25/16 249 lb (112.9 kg)  05/03/16 251 lb (113.9 kg)  04/20/16 253 lb (114.8 kg)  04/07/16 253 lb (114.8 kg)  03/22/16 255 lb (115.7 kg)  03/07/16 257 lb (116.6 kg)  02/22/16 258 lb (117 kg)  02/09/16 259 lb (117.5 kg)  01/27/16 259 lb (117.5 kg)  01/12/16 260 lb (117.9 kg)  07/21/15 258 lb 12.8 oz (117.4 kg)  04/14/15 251 lb 9.6 oz (114.1 kg)  01/08/15 250 lb (113.4 kg)  01/07/15 251 lb 6.4 oz (114 kg)  10/08/14 252 lb (114.3 kg)  07/02/14 248 lb 6.4 oz (112.7 kg)  08/06/12 242 lb 4.8 oz (109.9 kg)  05/07/12 239 lb (108.4 kg)  02/06/12 245 lb 4.8 oz (111.3 kg)  11/02/11 249 lb 11.2 oz (113.3 kg)  09/19/11 250 lb (113.4 kg)  08/08/11 250 lb 8 oz (113.6 kg)     ALLERGIES: Allergies  Allergen Reactions  . Milk-Related Compounds     Lactose intollerance    MEDICATIONS: Current Outpatient Prescriptions on File Prior to Visit  Medication Sig Dispense Refill  . Alcohol Swabs (ALCOHOL WIPES) 70 % PADS 30 Packages by Does not  apply route 2 (two) times daily. 100 each 0  . amLODipine (NORVASC) 5 MG tablet Take 5 mg by mouth daily.    Marland Kitchen atorvastatin (LIPITOR) 20 MG tablet Take 20 mg by mouth daily.    . calcium-vitamin D (OSCAL WITH D) 500-200 MG-UNIT tablet Take 2 tablets by mouth every morning. 60 tablet 0  . cholecalciferol (VITAMIN D) 1000 units tablet Take 1,000 Units by mouth daily.    . Clobetasol Propionate 0.05 % lotion Reported on 07/21/2015    . glucose blood (ACCU-CHEK GUIDE) test strip Use twice daily 100 each 0  . Lancets (ACCU-CHEK SAFE-T PRO) lancets Use as instructed 100 each 0  . lisinopril-hydrochlorothiazide (PRINZIDE,ZESTORETIC) 20-12.5 MG per tablet Take 1 tablet by mouth 2 (two) times daily.     . Melatonin 3 MG TABS Take 1 tablet by mouth at bedtime as needed.    . metFORMIN (GLUCOPHAGE-XR) 500 MG 24 hr tablet Take 500 mg by mouth 2 (two) times daily.    . prednisoLONE (PRELONE) 15 MG/5ML SOLN Take 15 mg by mouth. Reported on 07/21/2015    . propranolol (INDERAL) 20 MG tablet Take 20 mg by mouth 2 (two) times daily.      No current facility-administered medications on file prior to visit.     PAST  MEDICAL HISTORY: Past Medical History:  Diagnosis Date  . Alopecia   . Asthma   . Back pain   . Diabetes mellitus ([place])   . GERD (gastroesophageal reflux disease)   . Hyperlipidemia   . Hypertension   . Joint pain   . Lactose intolerance   . Obesity   . Raynauds syndrome   . Swelling   . Tremor   . Vitamin D deficiency     PAST SURGICAL HISTORY: Past Surgical History:  Procedure Laterality Date  . ABDOMINAL HYSTERECTOMY    . MYOMECTOMY      SOCIAL HISTORY: Social History  Substance Use Topics  . Smoking status: Never Smoker  . Smokeless tobacco: Never Used  . Alcohol use Not on file    FAMILY HISTORY: Family History  Problem Relation Age of Onset  . Multiple myeloma Mother   . Hypertension Mother   . Hyperlipidemia Mother   . Cancer Mother   . Obesity Mother   .  Hypertension Father   . Hyperlipidemia Father   . Cancer Father   . Asthma Other   . Hyperlipidemia Other   . Hypertension Other   . Cancer Other   . COPD Other   . Stroke Other     ROS: Review of Systems  Constitutional: Positive for weight loss.  Endo/Heme/Allergies:       Negative hypoglycemia    PHYSICAL EXAM: Blood pressure 108/74, pulse 97, temperature 98.8 F (37.1 C), temperature source Oral, height '5\' 4"'$  (1.626 m), weight 249 lb (112.9 kg), last menstrual period 08/31/2008, SpO2 98 %. Body mass index is 42.74 kg/m. Physical Exam  Constitutional: She is oriented to person, place, and time. She appears well-developed and well-nourished.  Cardiovascular: Normal rate.   Pulmonary/Chest: Effort normal.  Musculoskeletal: Normal range of motion.  Neurological: She is oriented to person, place, and time.  Skin: Skin is warm and dry.  Psychiatric: She has a normal mood and affect. Her behavior is normal.  Vitals reviewed.   RECENT LABS AND TESTS: BMET    Component Value Date/Time   NA 141 05/09/2016 1005   K 4.3 05/09/2016 1005   CL 100 05/09/2016 1005   CO2 25 05/09/2016 1005   GLUCOSE 108 (H) 05/09/2016 1005   GLUCOSE 99 09/10/2009 0540   BUN 15 05/09/2016 1005   CREATININE 0.72 05/09/2016 1005   CALCIUM 10.0 05/09/2016 1005   GFRNONAA 94 05/09/2016 1005   GFRAA 108 05/09/2016 1005   Lab Results  Component Value Date   HGBA1C 6.5 (H) 05/09/2016   HGBA1C 6.7 (H) 01/27/2016   HGBA1C 6.9 01/12/2016   HGBA1C 6.6 07/21/2015   HGBA1C 6.8 01/07/2015   Lab Results  Component Value Date   INSULIN 38.5 (H) 05/09/2016   INSULIN 27.3 (H) 01/27/2016   CBC    Component Value Date/Time   WBC 6.8 01/27/2016 1006   WBC 11.1 (H) 09/10/2009 0540   RBC 4.28 01/27/2016 1006   RBC 3.89 09/10/2009 0540   HGB 11.3 (L) 09/10/2009 0540   HCT 37.0 01/27/2016 1006   PLT 241 09/10/2009 0540   MCV 86 01/27/2016 1006   MCH 28.3 01/27/2016 1006   MCH 29.0 09/10/2009 0540     MCHC 32.7 01/27/2016 1006   MCHC 33.0 09/10/2009 0540   RDW 13.3 01/27/2016 1006   LYMPHSABS 2.0 01/27/2016 1006   EOSABS 0.2 01/27/2016 1006   BASOSABS 0.0 01/27/2016 1006   Iron/TIBC/Ferritin/ %Sat No results found for: IRON, TIBC, FERRITIN, IRONPCTSAT Lipid  Panel     Component Value Date/Time   CHOL 162 01/27/2016 1006   TRIG 133 01/27/2016 1006   HDL 51 01/27/2016 1006   LDLCALC 84 01/27/2016 1006   Hepatic Function Panel     Component Value Date/Time   PROT 6.9 05/09/2016 1005   ALBUMIN 4.1 05/09/2016 1005   AST 16 05/09/2016 1005   ALT 22 05/09/2016 1005   ALKPHOS 90 05/09/2016 1005   BILITOT 0.3 05/09/2016 1005      Component Value Date/Time   TSH 0.443 (L) 05/09/2016 1005   TSH 0.425 (L) 01/27/2016 1006    ASSESSMENT AND PLAN: Type 2 diabetes mellitus without complication, without long-term current use of insulin ([place]) - Plan: Insulin Pen Needle (BD PEN NEEDLE NANO U/F) 32G X 4 MM MISC  Type 2 diabetes mellitus without complication, with long-term current use of insulin ([place]) - Plan: liraglutide 18 MG/3ML SOPN  Morbid obesity ([place])  PLAN:  Diabetes II Haide has been given extensive diabetes education by myself today including ideal fasting and post-prandial blood glucose readings, individual ideal Hgb A1c goals  and hypoglycemia prevention. We discussed the importance of good blood sugar control to decrease the likelihood of diabetic complications such as nephropathy, neuropathy, limb loss, blindness, coronary artery disease, and death. We discussed the importance of intensive lifestyle modification including diet, exercise and weight loss as the first line treatment for diabetes. Dacey agrees to continue victoza 1.2 mg #2 pens, we will refill for 1 month and refill nano needles for 1 month and will follow up at the agreed upon time.  Obesity Trisha is currently in the action stage of change. As such, her goal is to continue with weight loss efforts She has  agreed to follow the Category 2 plan Arelie has been instructed to work up to a goal of 150 minutes of combined cardio and strengthening exercise per week for weight loss and overall health benefits. We discussed the following Behavioral Modification Stratagies today: no skipping meals, increasing lean protein intake, decreasing simple carbohydrates  and increasing vegetables  Deija has agreed to follow up with our clinic in 2 weeks. She was informed of the importance of frequent follow up visits to maximize her success with intensive lifestyle modifications for her multiple health conditions.  I, Doreene Nest, am acting as scribe for Dennard Nip, MD  I have reviewed the above documentation for accuracy and completeness, and I agree with the above. -Dennard Nip, MD

## 2016-06-15 ENCOUNTER — Ambulatory Visit (INDEPENDENT_AMBULATORY_CARE_PROVIDER_SITE_OTHER): Payer: 59 | Admitting: Family Medicine

## 2016-06-15 VITALS — BP 101/66 | HR 103 | Temp 98.2°F | Ht 64.0 in | Wt 250.0 lb

## 2016-06-15 DIAGNOSIS — E119 Type 2 diabetes mellitus without complications: Secondary | ICD-10-CM

## 2016-06-15 NOTE — Progress Notes (Signed)
Office: [phone]  /  Fax: [phone]   HPI:   Chief Complaint: OBESITY Veronica Russell is here to discuss her progress with her obesity treatment plan. She is on the  follow the Category 2 plan and is following her eating plan approximately 40 % of the time. She states she is exercising 0 minutes 0 times per week. Veronica Russell has been off track in the last 3 weeks with father's recent death. She tried to portion control/smart choices and is now ready to get back on track. Her weight is 250 lb (113.4 kg) today and has had a weight gain of 1 pound over a period of 3 weeks since her last visit. She has lost 9 lbs since starting treatment with [name].  Diabetes II Veronica Russell has a diagnosis of diabetes type II. [name] states BGs range between 107 and 158 mostly 120's and denies any hypoglycemic episodes but she has been off track with diet prescription. She is now ready to get back on track. She has been working on intensive lifestyle modifications including diet, exercise, and weight loss to help control her blood glucose levels.   ALLERGIES: Allergies  Allergen Reactions  . Milk-Related Compounds     Lactose intollerance    MEDICATIONS: Current Outpatient Prescriptions on File Prior to Visit  Medication Sig Dispense Refill  . Alcohol Swabs (ALCOHOL WIPES) 70 % PADS 30 Packages by Does not apply route 2 (two) times daily. 100 each 0  . amLODipine (NORVASC) 5 MG tablet Take 5 mg by mouth daily.    Marland Kitchen atorvastatin (LIPITOR) 20 MG tablet Take 20 mg by mouth daily.    . calcium-vitamin D (OSCAL WITH D) 500-200 MG-UNIT tablet Take 2 tablets by mouth every morning. 60 tablet 0  . cholecalciferol (VITAMIN D) 1000 units tablet Take 1,000 Units by mouth daily.    . Clobetasol Propionate 0.05 % lotion Reported on 07/21/2015    . glucose blood (ACCU-CHEK GUIDE) test strip Use twice daily 100 each 0  . Insulin Pen Needle (BD PEN NEEDLE NANO U/F) 32G X 4 MM MISC 1 Package by Does not apply route daily. 100 each 0  .  Lancets (ACCU-CHEK SAFE-T PRO) lancets Use as instructed 100 each 0  . liraglutide 18 MG/3ML SOPN Inject 0.2 mLs (1.2 mg total) into the skin daily. 6 mL 0  . lisinopril-hydrochlorothiazide (PRINZIDE,ZESTORETIC) 20-12.5 MG per tablet Take 1 tablet by mouth 2 (two) times daily.     . Melatonin 3 MG TABS Take 1 tablet by mouth at bedtime as needed.    . metFORMIN (GLUCOPHAGE-XR) 500 MG 24 hr tablet Take 500 mg by mouth 2 (two) times daily.    . prednisoLONE (PRELONE) 15 MG/5ML SOLN Take 15 mg by mouth. Reported on 07/21/2015    . propranolol (INDERAL) 20 MG tablet Take 20 mg by mouth 2 (two) times daily.      No current facility-administered medications on file prior to visit.     PAST MEDICAL HISTORY: Past Medical History:  Diagnosis Date  . Alopecia   . Asthma   . Back pain   . Diabetes mellitus ([place])   . GERD (gastroesophageal reflux disease)   . Hyperlipidemia   . Hypertension   . Joint pain   . Lactose intolerance   . Obesity   . Raynauds syndrome   . Swelling   . Tremor   . Vitamin D deficiency     PAST SURGICAL HISTORY: Past Surgical History:  Procedure Laterality Date  . ABDOMINAL  HYSTERECTOMY    . MYOMECTOMY      SOCIAL HISTORY: Social History  Substance Use Topics  . Smoking status: Never Smoker  . Smokeless tobacco: Never Used  . Alcohol use Not on file    FAMILY HISTORY: Family History  Problem Relation Age of Onset  . Multiple myeloma Mother   . Hypertension Mother   . Hyperlipidemia Mother   . Cancer Mother   . Obesity Mother   . Hypertension Father   . Hyperlipidemia Father   . Cancer Father   . Asthma Other   . Hyperlipidemia Other   . Hypertension Other   . Cancer Other   . COPD Other   . Stroke Other     ROS: Review of Systems  Constitutional: Negative for weight loss.  Endo/Heme/Allergies:       Negative hypoglycemia    PHYSICAL EXAM: Blood pressure 101/66, pulse (!) 103, temperature 98.2 F (36.8 C), temperature source Oral,  height '5\' 4"'$  (1.626 m), weight 250 lb (113.4 kg), last menstrual period 08/31/2008, SpO2 96 %. Body mass index is 42.91 kg/m. Physical Exam  Constitutional: She is oriented to person, place, and time. She appears well-developed and well-nourished.  Cardiovascular: Normal rate.   Pulmonary/Chest: Effort normal.  Musculoskeletal: Normal range of motion.  Neurological: She is oriented to person, place, and time.  Skin: Skin is warm and dry.  Psychiatric: She has a normal mood and affect. Her behavior is normal.  Vitals reviewed.   RECENT LABS AND TESTS: BMET    Component Value Date/Time   NA 141 05/09/2016 1005   K 4.3 05/09/2016 1005   CL 100 05/09/2016 1005   CO2 25 05/09/2016 1005   GLUCOSE 108 (H) 05/09/2016 1005   GLUCOSE 99 09/10/2009 0540   BUN 15 05/09/2016 1005   CREATININE 0.72 05/09/2016 1005   CALCIUM 10.0 05/09/2016 1005   GFRNONAA 94 05/09/2016 1005   GFRAA 108 05/09/2016 1005   Lab Results  Component Value Date   HGBA1C 6.5 (H) 05/09/2016   HGBA1C 6.7 (H) 01/27/2016   HGBA1C 6.9 01/12/2016   HGBA1C 6.6 07/21/2015   HGBA1C 6.8 01/07/2015   Lab Results  Component Value Date   INSULIN 38.5 (H) 05/09/2016   INSULIN 27.3 (H) 01/27/2016   CBC    Component Value Date/Time   WBC 6.8 01/27/2016 1006   WBC 11.1 (H) 09/10/2009 0540   RBC 4.28 01/27/2016 1006   RBC 3.89 09/10/2009 0540   HGB 11.3 (L) 09/10/2009 0540   HCT 37.0 01/27/2016 1006   PLT 241 09/10/2009 0540   MCV 86 01/27/2016 1006   MCH 28.3 01/27/2016 1006   MCH 29.0 09/10/2009 0540   MCHC 32.7 01/27/2016 1006   MCHC 33.0 09/10/2009 0540   RDW 13.3 01/27/2016 1006   LYMPHSABS 2.0 01/27/2016 1006   EOSABS 0.2 01/27/2016 1006   BASOSABS 0.0 01/27/2016 1006   Iron/TIBC/Ferritin/ %Sat No results found for: IRON, TIBC, FERRITIN, IRONPCTSAT Lipid Panel     Component Value Date/Time   CHOL 162 01/27/2016 1006   TRIG 133 01/27/2016 1006   HDL 51 01/27/2016 1006   LDLCALC 84 01/27/2016 1006     Hepatic Function Panel     Component Value Date/Time   PROT 6.9 05/09/2016 1005   ALBUMIN 4.1 05/09/2016 1005   AST 16 05/09/2016 1005   ALT 22 05/09/2016 1005   ALKPHOS 90 05/09/2016 1005   BILITOT 0.3 05/09/2016 1005      Component Value Date/Time  TSH 0.443 (L) 05/09/2016 1005   TSH 0.425 (L) 01/27/2016 1006    ASSESSMENT AND PLAN: Morbid obesity ([place])  Type 2 diabetes mellitus without complication, without long-term current use of insulin ([place])  PLAN:  Diabetes II Oaklee has been given extensive diabetes education by myself today including ideal fasting and post-prandial blood glucose readings, individual ideal Hgb A1c goals  and hypoglycemia prevention. We discussed the importance of good blood sugar control to decrease the likelihood of diabetic complications such as nephropathy, neuropathy, limb loss, blindness, coronary artery disease, and death. We discussed the importance of intensive lifestyle modification including diet, exercise and weight loss as the first line treatment for diabetes. Jovani agrees to continue Victoza at 1.2 mg and get back to diet prescription and will follow up at the agreed upon time.  We spent > than 50% of the 15 minute visit on the counseling as documented in the note.  Obesity Blima is currently in the action stage of change. As such, her goal is to continue with weight loss efforts She has agreed to follow the Category 2 plan Julann has been instructed to work up to a goal of 150 minutes of combined cardio and strengthening exercise per week or start walking for 30 minutes 2 times per week for weight loss and overall health benefits. We discussed the following Behavioral Modification Strategies today: increasing lean protein intake, work on meal planning and easy cooking plans and dealing with family or coworker sabotage  Tomoko has agreed to follow up with our clinic in 2 weeks. She was informed of the importance of frequent follow up visits  to maximize her success with intensive lifestyle modifications for her multiple health conditions.  I, Doreene Nest, am acting as scribe for Dennard Nip, MD  I have reviewed the above documentation for accuracy and completeness, and I agree with the above. -Dennard Nip, MD

## 2016-06-29 ENCOUNTER — Ambulatory Visit (INDEPENDENT_AMBULATORY_CARE_PROVIDER_SITE_OTHER): Payer: 59 | Admitting: Family Medicine

## 2016-06-29 VITALS — BP 106/71 | HR 97 | Temp 98.1°F | Ht 64.0 in | Wt 252.0 lb

## 2016-06-29 DIAGNOSIS — E119 Type 2 diabetes mellitus without complications: Secondary | ICD-10-CM

## 2016-06-29 DIAGNOSIS — E1165 Type 2 diabetes mellitus with hyperglycemia: Secondary | ICD-10-CM | POA: Insufficient documentation

## 2016-06-29 DIAGNOSIS — I10 Essential (primary) hypertension: Secondary | ICD-10-CM

## 2016-06-29 MED ORDER — INSULIN PEN NEEDLE 32G X 4 MM MISC: 1.0000 | Freq: Every day | 0 refills | Status: DC

## 2016-06-29 MED ORDER — LIRAGLUTIDE 18 MG/3ML ~~LOC~~ SOPN: 1.2000 mg | PEN_INJECTOR | Freq: Every day | SUBCUTANEOUS | 0 refills | Status: DC

## 2016-06-30 MED FILL — VICTOZA 2-PAK 18 MG/3 ML PE: 18 | 30 days supply | Qty: 6 | Fill #0

## 2016-06-30 NOTE — Progress Notes (Signed)
Office: [phone]  /  Fax: [phone]   HPI:   Chief Complaint: OBESITY Veronica Russell is here to discuss her progress with her obesity treatment plan. She is on the  follow the Category 2 plan and is following her eating plan approximately 75 % of the time. She states she is walking 30 minutes 3 times per week. Veronica Russell  Struggled with holiday eating and has had a hard time with the recent passing away of her father. Her weight is 252 lb (114.3 kg) today and has had a weight gain of 2 pounds over a period of 2 weeks since her last visit. She has lost 7 lbs since starting treatment with [name].  Diabetes II Veronica Russell has a diagnosis of diabetes type II. Veronica Russell states BGs range between 100 and 160's and denies any hypoglycemic episodes. She has been working on intensive lifestyle modifications including diet, exercise, and weight loss to help control her blood glucose levels.  Hypertension Veronica Russell is a 56 y.o. female with hypertension.  Veronica Russell denies chest pain or shortness of breath on exertion. She is working weight loss to help control her blood pressure with the goal of decreasing her risk of heart attack and stroke. Veronica Russell blood pressure is currently controlled.   ALLERGIES: Allergies  Allergen Reactions  . Milk-Related Compounds     Lactose intollerance    MEDICATIONS: Current Outpatient Prescriptions on File Prior to Visit  Medication Sig Dispense Refill  . Alcohol Swabs (ALCOHOL WIPES) 70 % PADS 30 Packages by Does not apply route 2 (two) times daily. 100 each 0  . amLODipine (NORVASC) 5 MG tablet Take 5 mg by mouth daily.    Marland Kitchen atorvastatin (LIPITOR) 20 MG tablet Take 20 mg by mouth daily.    . calcium-vitamin D (OSCAL WITH D) 500-200 MG-UNIT tablet Take 2 tablets by mouth every morning. 60 tablet 0  . cholecalciferol (VITAMIN D) 1000 units tablet Take 1,000 Units by mouth daily.    . Clobetasol Propionate 0.05 % lotion Reported on 07/21/2015    . glucose blood  (ACCU-CHEK GUIDE) test strip Use twice daily 100 each 0  . Lancets (ACCU-CHEK SAFE-T PRO) lancets Use as instructed 100 each 0  . lisinopril-hydrochlorothiazide (PRINZIDE,ZESTORETIC) 20-12.5 MG per tablet Take 1 tablet by mouth 2 (two) times daily.     . Melatonin 3 MG TABS Take 1 tablet by mouth at bedtime as needed.    . metFORMIN (GLUCOPHAGE-XR) 500 MG 24 hr tablet Take 500 mg by mouth 2 (two) times daily.    . prednisoLONE (PRELONE) 15 MG/5ML SOLN Take 15 mg by mouth. Reported on 07/21/2015    . propranolol (INDERAL) 20 MG tablet Take 20 mg by mouth 2 (two) times daily.      No current facility-administered medications on file prior to visit.     PAST MEDICAL HISTORY: Past Medical History:  Diagnosis Date  . Alopecia   . Asthma   . Back pain   . Diabetes mellitus ([place])   . GERD (gastroesophageal reflux disease)   . Hyperlipidemia   . Hypertension   . Joint pain   . Lactose intolerance   . Obesity   . Raynauds syndrome   . Swelling   . Tremor   . Vitamin D deficiency     PAST SURGICAL HISTORY: Past Surgical History:  Procedure Laterality Date  . ABDOMINAL HYSTERECTOMY    . MYOMECTOMY      SOCIAL HISTORY: Social History  Substance Use Topics  .  Smoking status: Never Smoker  . Smokeless tobacco: Never Used  . Alcohol use Not on file    FAMILY HISTORY: Family History  Problem Relation Age of Onset  . Multiple myeloma Mother   . Hypertension Mother   . Hyperlipidemia Mother   . Cancer Mother   . Obesity Mother   . Hypertension Father   . Hyperlipidemia Father   . Cancer Father   . Asthma Other   . Hyperlipidemia Other   . Hypertension Other   . Cancer Other   . COPD Other   . Stroke Other     ROS: Review of Systems  Constitutional: Negative for weight loss.  Respiratory: Negative for shortness of breath (on exertion).   Cardiovascular: Negative for chest pain.  Endo/Heme/Allergies:       Negative hypoglycemia    PHYSICAL EXAM: Blood pressure  106/71, pulse 97, temperature 98.1 F (36.7 C), temperature source Oral, height '5\' 4"'$  (1.626 m), weight 252 lb (114.3 kg), last menstrual period 08/31/2008, SpO2 96 %. Body mass index is 43.26 kg/m. Physical Exam  RECENT LABS AND TESTS: BMET    Component Value Date/Time   NA 141 05/09/2016 1005   K 4.3 05/09/2016 1005   CL 100 05/09/2016 1005   CO2 25 05/09/2016 1005   GLUCOSE 108 (H) 05/09/2016 1005   GLUCOSE 99 09/10/2009 0540   BUN 15 05/09/2016 1005   CREATININE 0.72 05/09/2016 1005   CALCIUM 10.0 05/09/2016 1005   GFRNONAA 94 05/09/2016 1005   GFRAA 108 05/09/2016 1005   Lab Results  Component Value Date   HGBA1C 6.5 (H) 05/09/2016   HGBA1C 6.7 (H) 01/27/2016   HGBA1C 6.9 01/12/2016   HGBA1C 6.6 07/21/2015   HGBA1C 6.8 01/07/2015   Lab Results  Component Value Date   INSULIN 38.5 (H) 05/09/2016   INSULIN 27.3 (H) 01/27/2016   CBC    Component Value Date/Time   WBC 6.8 01/27/2016 1006   WBC 11.1 (H) 09/10/2009 0540   RBC 4.28 01/27/2016 1006   RBC 3.89 09/10/2009 0540   HGB 11.3 (L) 09/10/2009 0540   HCT 37.0 01/27/2016 1006   PLT 241 09/10/2009 0540   MCV 86 01/27/2016 1006   MCH 28.3 01/27/2016 1006   MCH 29.0 09/10/2009 0540   MCHC 32.7 01/27/2016 1006   MCHC 33.0 09/10/2009 0540   RDW 13.3 01/27/2016 1006   LYMPHSABS 2.0 01/27/2016 1006   EOSABS 0.2 01/27/2016 1006   BASOSABS 0.0 01/27/2016 1006   Iron/TIBC/Ferritin/ %Sat No results found for: IRON, TIBC, FERRITIN, IRONPCTSAT Lipid Panel     Component Value Date/Time   CHOL 162 01/27/2016 1006   TRIG 133 01/27/2016 1006   HDL 51 01/27/2016 1006   LDLCALC 84 01/27/2016 1006   Hepatic Function Panel     Component Value Date/Time   PROT 6.9 05/09/2016 1005   ALBUMIN 4.1 05/09/2016 1005   AST 16 05/09/2016 1005   ALT 22 05/09/2016 1005   ALKPHOS 90 05/09/2016 1005   BILITOT 0.3 05/09/2016 1005      Component Value Date/Time   TSH 0.443 (L) 05/09/2016 1005   TSH 0.425 (L) 01/27/2016  1006    ASSESSMENT AND PLAN: Type 2 diabetes mellitus without complication, without long-term current use of insulin ([place]) - Plan: liraglutide 18 MG/3ML SOPN, Insulin Pen Needle (BD PEN NEEDLE NANO U/F) 32G X 4 MM MISC  Essential hypertension  Morbid obesity ([facility]) - BMI 43.3  PLAN:  Diabetes II Jenefer has been given extensive diabetes  education by myself today including ideal fasting and post-prandial blood glucose readings, individual ideal Hgb A1c goals  and hypoglycemia prevention. We discussed the importance of good blood sugar control to decrease the likelihood of diabetic complications such as nephropathy, neuropathy, limb loss, blindness, coronary artery disease, and death. We discussed the importance of intensive lifestyle modification including diet, exercise and weight loss as the first line treatment for diabetes. Kera agrees to continue her diabetes medications, we will refill Liraglutide for 1 month and will follow up at the agreed upon time.  Hypertension We discussed sodium restriction, working on healthy weight loss, and a regular exercise program as the means to achieve improved blood pressure control. Izabela agreed with this plan and agreed to follow up as directed. We will continue to monitor her blood pressure as well as her progress with the above lifestyle modifications. She will continue Amlodipine, Lisinopril-HCTZ as prescribed and will watch for signs of hypotension as she continues her lifestyle modifications. Xaviera agrees to follow up with our clinic in 2 weeks.  Obesity Teresina is currently in the action stage of change. As such, her goal is to continue with weight loss efforts She has agreed to follow the Category 2 plan Namiah has been instructed to work up to a goal of 150 minutes of combined cardio and strengthening exercise per week for weight loss and overall health benefits. We discussed the following Behavioral Modification Strategies today: holiday eating  strategies  and emotional eating strategies  Joslyn has agreed to follow up with our clinic in 2 weeks. She was informed of the importance of frequent follow up visits to maximize her success with intensive lifestyle modifications for her multiple health conditions.  I, Doreene Nest, am acting as scribe for Dennard Nip, MD  I have reviewed the above documentation for accuracy and completeness, and I agree with the above. -Dennard Nip, MD  OBESITY BEHAVIORAL INTERVENTION VISIT  Today's visit was # 11 out of 22.  Starting weight: 259 lbs Starting date: 01/27/16 Today's weight : 252 lbs Today's date: 06/29/2016 Total lbs lost to date: 7 (Patients must lose 7 lbs in the first 6 months to continue with counseling)   ASK: We discussed the diagnosis of obesity with Veronica Russell today and Barbarajean agreed to give [name] permission to discuss obesity behavioral modification therapy today.  ASSESS: Vali has the diagnosis of obesity and her BMI today is 43.3 Faren is in the action stage of change   ADVISE: Aryona was educated on the multiple health risks of obesity as well as the benefit of weight loss to improve her health. She was advised of the need for long term treatment and the importance of lifestyle modifications.  AGREE: Multiple dietary modification options and treatment options were discussed and  Terressa agreed to follow the Category 2 plan We discussed the following Behavioral Modification Strategies today: holiday eating strategies  and emotional eating strategies

## 2016-07-13 ENCOUNTER — Ambulatory Visit (INDEPENDENT_AMBULATORY_CARE_PROVIDER_SITE_OTHER): Payer: 59 | Admitting: Family Medicine

## 2016-07-14 ENCOUNTER — Other Ambulatory Visit: Payer: Self-pay | Admitting: Family Medicine

## 2016-07-14 DIAGNOSIS — Z1231 Encounter for screening mammogram for malignant neoplasm of breast: Secondary | ICD-10-CM

## 2016-07-19 MED FILL — ATORVASTATIN 20 MG TABLET: 20 | 90 days supply | Qty: 90 | Fill #1

## 2016-07-19 MED FILL — AMLODIPINE BESYLATE 5 MG TA: 5 | 90 days supply | Qty: 90 | Fill #1

## 2016-07-19 MED FILL — PROPRANOLOL 20 MG TABLET: 20 | 90 days supply | Qty: 180 | Fill #1

## 2016-07-19 MED FILL — METFORMIN HCL ER 500 MG TAB: 500 | 90 days supply | Qty: 180 | Fill #0

## 2016-07-19 MED FILL — LISINOPRIL-HCTZ 20-12.5 MG: 20-12.5 | 90 days supply | Qty: 180 | Fill #1

## 2016-07-27 ENCOUNTER — Ambulatory Visit (INDEPENDENT_AMBULATORY_CARE_PROVIDER_SITE_OTHER): Payer: 59 | Admitting: Family Medicine

## 2016-07-27 VITALS — BP 115/77 | HR 97 | Temp 98.4°F | Ht 64.0 in | Wt 255.0 lb

## 2016-07-27 DIAGNOSIS — E669 Obesity, unspecified: Secondary | ICD-10-CM

## 2016-07-27 DIAGNOSIS — IMO0001 Reserved for inherently not codable concepts without codable children: Secondary | ICD-10-CM

## 2016-07-27 DIAGNOSIS — Z6841 Body Mass Index (BMI) 40.0 and over, adult: Secondary | ICD-10-CM

## 2016-07-27 DIAGNOSIS — Z794 Long term (current) use of insulin: Secondary | ICD-10-CM

## 2016-07-27 DIAGNOSIS — E119 Type 2 diabetes mellitus without complications: Secondary | ICD-10-CM

## 2016-07-27 MED ORDER — ACCU-CHEK SAFE-T PRO LANCETS MISC: 0 refills | Status: DC

## 2016-07-27 MED ORDER — LIRAGLUTIDE 18 MG/3ML ~~LOC~~ SOPN: 1.2000 mg | PEN_INJECTOR | Freq: Every day | SUBCUTANEOUS | 0 refills | Status: DC

## 2016-07-27 MED ORDER — INSULIN PEN NEEDLE 32G X 4 MM MISC: 1.0000 | Freq: Every day | 0 refills | Status: DC

## 2016-07-27 MED ORDER — GLUCOSE BLOOD VI STRP: ORAL_STRIP | 0 refills | Status: DC

## 2016-07-27 MED ORDER — ALCOHOL WIPES 70 % PADS: 30.0000 | MEDICATED_PAD | Freq: Two times a day (BID) | 0 refills | Status: DC

## 2016-07-27 MED FILL — VICTOZA 2-PAK 18 MG/3 ML PE: 18 | 30 days supply | Qty: 6 | Fill #0

## 2016-07-27 MED FILL — ACCU-CHEK GUIDE TEST STRIP: 50 days supply | Qty: 100 | Fill #0

## 2016-07-27 MED FILL — SM ALCOHOL 70% PREP PADS: 50 days supply | Qty: 100 | Fill #0

## 2016-07-27 MED FILL — ACCU-CHEK FASTCLIX LANCETS: 51 days supply | Qty: 102 | Fill #0

## 2016-07-27 NOTE — Progress Notes (Signed)
Office: [phone]  /  Fax: [phone]   HPI:   Chief Complaint: OBESITY Veronica Russell is here to discuss her progress with her obesity treatment plan. She is on the  follow the Category 2 plan and is following her eating plan approximately 75 % of the time. She states she is exercising 0 minutes 0 times per week. Veronica Russell off track last month. She has been trying to portion control but has increased eating out. She notes some friend sabotage. Veronica Russell is ready to get back on track. Her weight is 255 lb (115.7 kg) today and has had a weight gain of 3 pounds over a period of 4 weeks since her last visit. She has lost 4 lbs since starting treatment with Veronica Russell.  Diabetes II Veronica Russell has a diagnosis of diabetes type II. Veronica Russell states fasting blood sugars range between 103 and 129 and 2 hour post prandial range between 100 and 165 on metformin and victoza and denies nausea, vomiting or any hypoglycemic episodes. She has been working on intensive lifestyle modifications including diet, exercise, and weight loss to help control her blood glucose levels.   ALLERGIES: Allergies  Allergen Reactions  . Milk-Related Compounds     Lactose intollerance    MEDICATIONS: Current Outpatient Prescriptions on File Prior to Visit  Medication Sig Dispense Refill  . amLODipine (NORVASC) 5 MG tablet Take 5 mg by mouth daily.    Marland Kitchen atorvastatin (LIPITOR) 20 MG tablet Take 20 mg by mouth daily.    . calcium-vitamin D (OSCAL WITH D) 500-200 MG-UNIT tablet Take 2 tablets by mouth every morning. 60 tablet 0  . cholecalciferol (VITAMIN D) 1000 units tablet Take 1,000 Units by mouth daily.    . Clobetasol Propionate 0.05 % lotion Reported on 07/21/2015    . lisinopril-hydrochlorothiazide (PRINZIDE,ZESTORETIC) 20-12.5 MG per tablet Take 1 tablet by mouth 2 (two) times daily.     . Melatonin 3 MG TABS Take 1 tablet by mouth at bedtime as needed.    . metFORMIN (GLUCOPHAGE-XR) 500 MG 24 hr tablet Take 500 mg by mouth 2 (two) times  daily.    . prednisoLONE (PRELONE) 15 MG/5ML SOLN Take 15 mg by mouth. Reported on 07/21/2015    . propranolol (INDERAL) 20 MG tablet Take 20 mg by mouth 2 (two) times daily.      No current facility-administered medications on file prior to visit.     PAST MEDICAL HISTORY: Past Medical History:  Diagnosis Date  . Alopecia   . Asthma   . Back pain   . Diabetes mellitus ([place])   . GERD (gastroesophageal reflux disease)   . Hyperlipidemia   . Hypertension   . Joint pain   . Lactose intolerance   . Obesity   . Raynauds syndrome   . Swelling   . Tremor   . Vitamin D deficiency     PAST SURGICAL HISTORY: Past Surgical History:  Procedure Laterality Date  . ABDOMINAL HYSTERECTOMY    . MYOMECTOMY      SOCIAL HISTORY: Social History  Substance Use Topics  . Smoking status: Never Smoker  . Smokeless tobacco: Never Used  . Alcohol use Not on file    FAMILY HISTORY: Family History  Problem Relation Age of Onset  . Multiple myeloma Mother   . Hypertension Mother   . Hyperlipidemia Mother   . Cancer Mother   . Obesity Mother   . Hypertension Father   . Hyperlipidemia Father   . Cancer Father   . Asthma  Other   . Hyperlipidemia Other   . Hypertension Other   . Cancer Other   . COPD Other   . Stroke Other     ROS: Review of Systems  Constitutional: Positive for weight loss.  Gastrointestinal: Negative for nausea and vomiting.  Endo/Heme/Allergies:       Negative hypoglycemia    PHYSICAL EXAM: Blood pressure 115/77, pulse 97, temperature 98.4 F (36.9 C), temperature source Oral, height '5\' 4"'$  (1.626 m), weight 255 lb (115.7 kg), last menstrual period 08/31/2008, SpO2 97 %. Body mass index is 43.77 kg/m. Physical Exam  Constitutional: She is oriented to person, place, and time. She appears well-developed and well-nourished.  Cardiovascular: Normal rate.   Pulmonary/Chest: Effort normal.  Musculoskeletal: Normal range of motion.  Neurological: She is oriented  to person, place, and time.  Skin: Skin is warm and dry.  Psychiatric: She has a normal mood and affect. Her behavior is normal.  Vitals reviewed.   RECENT LABS AND TESTS: BMET    Component Value Date/Time   NA 141 05/09/2016 1005   K 4.3 05/09/2016 1005   CL 100 05/09/2016 1005   CO2 25 05/09/2016 1005   GLUCOSE 108 (H) 05/09/2016 1005   GLUCOSE 99 09/10/2009 0540   BUN 15 05/09/2016 1005   CREATININE 0.72 05/09/2016 1005   CALCIUM 10.0 05/09/2016 1005   GFRNONAA 94 05/09/2016 1005   GFRAA 108 05/09/2016 1005   Lab Results  Component Value Date   HGBA1C 6.5 (H) 05/09/2016   HGBA1C 6.7 (H) 01/27/2016   HGBA1C 6.9 01/12/2016   HGBA1C 6.6 07/21/2015   HGBA1C 6.8 01/07/2015   Lab Results  Component Value Date   INSULIN 38.5 (H) 05/09/2016   INSULIN 27.3 (H) 01/27/2016   CBC    Component Value Date/Time   WBC 6.8 01/27/2016 1006   WBC 11.1 (H) 09/10/2009 0540   RBC 4.28 01/27/2016 1006   RBC 3.89 09/10/2009 0540   HGB 12.1 01/27/2016 1006   HCT 37.0 01/27/2016 1006   PLT 241 09/10/2009 0540   MCV 86 01/27/2016 1006   MCH 28.3 01/27/2016 1006   MCH 29.0 09/10/2009 0540   MCHC 32.7 01/27/2016 1006   MCHC 33.0 09/10/2009 0540   RDW 13.3 01/27/2016 1006   LYMPHSABS 2.0 01/27/2016 1006   EOSABS 0.2 01/27/2016 1006   BASOSABS 0.0 01/27/2016 1006   Iron/TIBC/Ferritin/ %Sat No results found for: IRON, TIBC, FERRITIN, IRONPCTSAT Lipid Panel     Component Value Date/Time   CHOL 162 01/27/2016 1006   TRIG 133 01/27/2016 1006   HDL 51 01/27/2016 1006   LDLCALC 84 01/27/2016 1006   Hepatic Function Panel     Component Value Date/Time   PROT 6.9 05/09/2016 1005   ALBUMIN 4.1 05/09/2016 1005   AST 16 05/09/2016 1005   ALT 22 05/09/2016 1005   ALKPHOS 90 05/09/2016 1005   BILITOT 0.3 05/09/2016 1005      Component Value Date/Time   TSH 0.443 (L) 05/09/2016 1005   TSH 0.425 (L) 01/27/2016 1006    ASSESSMENT AND PLAN: Type 2 diabetes mellitus without  complication, with long-term current use of insulin (HCC) - Plan: liraglutide 18 MG/3ML SOPN, Insulin Pen Needle (BD PEN NEEDLE NANO U/F) 32G X 4 MM MISC, glucose blood (ACCU-CHEK GUIDE) test strip, Alcohol Swabs (ALCOHOL WIPES) 70 % PADS, Lancets (ACCU-CHEK SAFE-T PRO) lancets  Class 3 obesity with serious comorbidity and body mass index (BMI) of 40.0 to 44.9 in adult, unspecified obesity type (HCC)  PLAN:  Diabetes II Laurinda has been given extensive diabetes education by myself today including ideal fasting and post-prandial blood glucose readings, individual ideal Hgb A1c goals  and hypoglycemia prevention. We discussed the importance of good blood sugar control to decrease the likelihood of diabetic complications such as nephropathy, neuropathy, limb loss, blindness, coronary artery disease, and death. We discussed the importance of intensive lifestyle modification including diet, exercise and weight loss as the first line treatment for diabetes. Sanam agrees to continue victoza, we will refill for 1 month and will refill nano needles #100 and glucose strips #100 and ETOH wipes and lancets. Lanora agrees to follow up at the agreed upon time.  Obesity Berdine is currently in the action stage of change. As such, her goal is to continue with weight loss efforts She has agreed to follow the Category 2 plan Caci has been instructed to work up to a goal of 150 minutes of combined cardio and strengthening exercise per week for weight loss and overall health benefits. We discussed the following Behavioral Modification Strategies today: meal planning & cooking strategies, friend/family/co-worker sabotage, increasing lean protein intake and decreasing simple carbohydrates   Shandee has agreed to follow up with our clinic in 2 to 3 weeks. She was informed of the importance of frequent follow up visits to maximize her success with intensive lifestyle modifications for her multiple health conditions.  I, Doreene Nest, am acting as transcriptionist for Dennard Nip, MD  I have reviewed the above documentation for accuracy and completeness, and I agree with the above. -Dennard Nip, MD  OBESITY BEHAVIORAL INTERVENTION VISIT  Today's visit was # 12 out of 22.  Starting weight: 259 lbs Starting date: 01/27/16 Today's weight : 255 lbs Today's date: 07/27/2016 Total lbs lost to date: 4 (Patients must lose 7 lbs in the first 6 months to continue with counseling)   ASK: We discussed the diagnosis of obesity with [name] today and [name] agreed to give Veronica Russell permission to discuss obesity behavioral modification therapy today.  ASSESS: Courtnay has the diagnosis of obesity and her BMI today is 5.9 Giulietta is in the action stage of change   ADVISE: Camelia was educated on the multiple health risks of obesity as well as the benefit of weight loss to improve her health. She was advised of the need for long term treatment and the importance of lifestyle modifications.  AGREE: Multiple dietary modification options and treatment options were discussed and  Catina agreed to follow the Category 2 plan We discussed the following Behavioral Modification Strategies today: meal planning & cooking strategies, friend/family/co-worker sabotage, increasing lean protein intake and decreasing simple carbohydrates

## 2016-07-29 DIAGNOSIS — B372 Candidiasis of skin and nail: Secondary | ICD-10-CM | POA: Diagnosis not present

## 2016-07-29 MED FILL — NYSTOP 100,000 UNITS/GM PWD: 100000 | 14 days supply | Qty: 60 | Fill #0

## 2016-08-07 DIAGNOSIS — J069 Acute upper respiratory infection, unspecified: Secondary | ICD-10-CM | POA: Diagnosis not present

## 2016-08-18 ENCOUNTER — Ambulatory Visit (INDEPENDENT_AMBULATORY_CARE_PROVIDER_SITE_OTHER): Payer: 59 | Admitting: Family Medicine

## 2016-08-18 VITALS — BP 94/63 | HR 94 | Temp 98.4°F | Ht 64.0 in | Wt 249.0 lb

## 2016-08-18 DIAGNOSIS — Z6841 Body Mass Index (BMI) 40.0 and over, adult: Secondary | ICD-10-CM | POA: Diagnosis not present

## 2016-08-18 DIAGNOSIS — E119 Type 2 diabetes mellitus without complications: Secondary | ICD-10-CM

## 2016-08-18 DIAGNOSIS — I10 Essential (primary) hypertension: Secondary | ICD-10-CM | POA: Diagnosis not present

## 2016-08-18 DIAGNOSIS — E669 Obesity, unspecified: Secondary | ICD-10-CM | POA: Diagnosis not present

## 2016-08-18 DIAGNOSIS — IMO0001 Reserved for inherently not codable concepts without codable children: Secondary | ICD-10-CM

## 2016-08-18 MED ORDER — LIRAGLUTIDE 18 MG/3ML ~~LOC~~ SOPN: 1.2000 mg | PEN_INJECTOR | Freq: Every day | SUBCUTANEOUS | 0 refills | Status: DC

## 2016-08-18 NOTE — Progress Notes (Signed)
Office: [phone]  /  Fax: [phone]   HPI:   Chief Complaint: OBESITY Veronica Russell is here to discuss her progress with her obesity treatment plan. She is on the  follow the Category 2 plan and is following her eating plan approximately 75 % of the time. She states she is exercising 0 minutes 0 times per week. Veronica Russell continues to do well with weight loss. She plans ahead well and is motivated to keep up with weight loss efforts. Her weight is 249 lb (112.9 kg) today and has had a weight loss of 6 pounds over a period of 3 weeks since her last visit. She has lost 10 lbs since starting treatment with [name].  Diabetes II Veronica Russell has a diagnosis of diabetes type II. Veronica Russell states fasting BGs range between 104 and 125 and denies polyphagia or any hypoglycemic episodes. She has been working on intensive lifestyle modifications including diet, exercise, and weight loss to help control her blood glucose levels.  Hypertension Veronica Russell is a 56 y.o. female with hypertension. Her blood pressure is stable and Veronica Russell denies chest pain or shortness of breath on exertion. She felt lightheaded on a couple of occasions, unsure if related to hypotension/hypoglycemia. She is working weight loss to help control her blood pressure with the goal of decreasing her risk of heart attack and stroke. [name] is currently controlled.    ALLERGIES: Allergies  Allergen Reactions  . Milk-Related Compounds     Lactose intollerance    MEDICATIONS: Current Outpatient Prescriptions on File Prior to Visit  Medication Sig Dispense Refill  . Alcohol Swabs (ALCOHOL WIPES) 70 % PADS 30 Packages by Does not apply route 2 (two) times daily. 100 each 0  . amLODipine (NORVASC) 5 MG tablet Take 5 mg by mouth daily.    Marland Kitchen atorvastatin (LIPITOR) 20 MG tablet Take 20 mg by mouth daily.    . calcium-vitamin D (OSCAL WITH D) 500-200 MG-UNIT tablet Take 2 tablets by mouth every morning. 60 tablet 0  .  cholecalciferol (VITAMIN D) 1000 units tablet Take 1,000 Units by mouth daily.    . Clobetasol Propionate 0.05 % lotion Reported on 07/21/2015    . glucose blood (ACCU-CHEK GUIDE) test strip Use twice daily 100 each 0  . Insulin Pen Needle (BD PEN NEEDLE NANO U/F) 32G X 4 MM MISC 1 Package by Does not apply route daily. 100 each 0  . Lancets (ACCU-CHEK SAFE-T PRO) lancets Use as instructed 100 each 0  . lisinopril-hydrochlorothiazide (PRINZIDE,ZESTORETIC) 20-12.5 MG per tablet Take 1 tablet by mouth 2 (two) times daily.     . Melatonin 3 MG TABS Take 1 tablet by mouth at bedtime as needed.    . metFORMIN (GLUCOPHAGE-XR) 500 MG 24 hr tablet Take 500 mg by mouth 2 (two) times daily.    . prednisoLONE (PRELONE) 15 MG/5ML SOLN Take 15 mg by mouth. Reported on 07/21/2015    . propranolol (INDERAL) 20 MG tablet Take 20 mg by mouth 2 (two) times daily.      No current facility-administered medications on file prior to visit.     PAST MEDICAL HISTORY: Past Medical History:  Diagnosis Date  . Alopecia   . Asthma   . Back pain   . Diabetes mellitus ([place])   . GERD (gastroesophageal reflux disease)   . Hyperlipidemia   . Hypertension   . Joint pain   . Lactose intolerance   . Obesity   . Raynauds syndrome   .  Swelling   . Tremor   . Vitamin D deficiency     PAST SURGICAL HISTORY: Past Surgical History:  Procedure Laterality Date  . ABDOMINAL HYSTERECTOMY    . MYOMECTOMY      SOCIAL HISTORY: Social History  Substance Use Topics  . Smoking status: Never Smoker  . Smokeless tobacco: Never Used  . Alcohol use Not on file    FAMILY HISTORY: Family History  Problem Relation Age of Onset  . Multiple myeloma Mother   . Hypertension Mother   . Hyperlipidemia Mother   . Cancer Mother   . Obesity Mother   . Hypertension Father   . Hyperlipidemia Father   . Cancer Father   . Asthma Other   . Hyperlipidemia Other   . Hypertension Other   . Cancer Other   . COPD Other   . Stroke  Other     ROS: Review of Systems  Constitutional: Positive for weight loss.  Respiratory: Negative for shortness of breath (on exertion).   Cardiovascular: Negative for chest pain.  Neurological:       Lightheadedness  Endo/Heme/Allergies:       Negative polyphagia Negative hypoglycemia    PHYSICAL EXAM: Blood pressure 94/63, pulse 94, temperature 98.4 F (36.9 C), temperature source Oral, height '5\' 4"'$  (1.626 m), weight 249 lb (112.9 kg), last menstrual period 08/31/2008, SpO2 98 %. Body mass index is 42.74 kg/m. Physical Exam  Constitutional: She is oriented to person, place, and time. She appears well-developed and well-nourished.  Cardiovascular: Normal rate.   Pulmonary/Chest: Effort normal.  Musculoskeletal: Normal range of motion.  Neurological: She is oriented to person, place, and time.  Skin: Skin is warm and dry.  Psychiatric: She has a normal mood and affect. Her behavior is normal.  Vitals reviewed.   RECENT LABS AND TESTS: BMET    Component Value Date/Time   NA 141 05/09/2016 1005   K 4.3 05/09/2016 1005   CL 100 05/09/2016 1005   CO2 25 05/09/2016 1005   GLUCOSE 108 (H) 05/09/2016 1005   GLUCOSE 99 09/10/2009 0540   BUN 15 05/09/2016 1005   CREATININE 0.72 05/09/2016 1005   CALCIUM 10.0 05/09/2016 1005   GFRNONAA 94 05/09/2016 1005   GFRAA 108 05/09/2016 1005   Lab Results  Component Value Date   HGBA1C 6.5 (H) 05/09/2016   HGBA1C 6.7 (H) 01/27/2016   HGBA1C 6.9 01/12/2016   HGBA1C 6.6 07/21/2015   HGBA1C 6.8 01/07/2015   Lab Results  Component Value Date   INSULIN 38.5 (H) 05/09/2016   INSULIN 27.3 (H) 01/27/2016   CBC    Component Value Date/Time   WBC 6.8 01/27/2016 1006   WBC 11.1 (H) 09/10/2009 0540   RBC 4.28 01/27/2016 1006   RBC 3.89 09/10/2009 0540   HGB 12.1 01/27/2016 1006   HCT 37.0 01/27/2016 1006   PLT 241 09/10/2009 0540   MCV 86 01/27/2016 1006   MCH 28.3 01/27/2016 1006   MCH 29.0 09/10/2009 0540   MCHC 32.7  01/27/2016 1006   MCHC 33.0 09/10/2009 0540   RDW 13.3 01/27/2016 1006   LYMPHSABS 2.0 01/27/2016 1006   EOSABS 0.2 01/27/2016 1006   BASOSABS 0.0 01/27/2016 1006   Iron/TIBC/Ferritin/ %Sat No results found for: IRON, TIBC, FERRITIN, IRONPCTSAT Lipid Panel     Component Value Date/Time   CHOL 162 01/27/2016 1006   TRIG 133 01/27/2016 1006   HDL 51 01/27/2016 1006   LDLCALC 84 01/27/2016 1006   Hepatic Function Panel  Component Value Date/Time   PROT 6.9 05/09/2016 1005   ALBUMIN 4.1 05/09/2016 1005   AST 16 05/09/2016 1005   ALT 22 05/09/2016 1005   ALKPHOS 90 05/09/2016 1005   BILITOT 0.3 05/09/2016 1005      Component Value Date/Time   TSH 0.443 (L) 05/09/2016 1005   TSH 0.425 (L) 01/27/2016 1006    ASSESSMENT AND PLAN: Type 2 diabetes mellitus without complication, without long-term current use of insulin (HCC) - Plan: liraglutide 18 MG/3ML SOPN  Essential hypertension  Class 3 obesity with serious comorbidity and body mass index (BMI) of 40.0 to 44.9 in adult, unspecified obesity type (HCC)  PLAN:  Diabetes II Veronica Russell has been given extensive diabetes education by myself today including ideal fasting and post-prandial blood glucose readings, individual ideal Hgb A1c goals  and hypoglycemia prevention. We discussed the importance of good blood sugar control to decrease the likelihood of diabetic complications such as nephropathy, neuropathy, limb loss, blindness, coronary artery disease, and death. We discussed the importance of intensive lifestyle modification including diet, exercise and weight loss as the first line treatment for diabetes. Veronica Russell agrees to continue Victoza, we will refill for 2 pens and will follow up at the agreed upon time.  Hypertension We discussed sodium restriction, working on healthy weight loss, and a regular exercise program as the means to achieve improved blood pressure control. Veronica Russell agreed with this plan and agreed to follow up as  directed. We will continue to monitor her blood pressure as well as her progress with the above lifestyle modifications. She will continue her medications as prescribed and will watch for signs of hypotension as she continues her lifestyle modifications. Veronica Russell agrees to keep blood pressure log to assess need to adjust medications.  Obesity Veronica Russell is currently in the action stage of change. As such, her goal is to continue with weight loss efforts She has agreed to keep a food journal with 400 to 500 calories and 40+ grams of protein at supper daily and follow the Category 2 plan Veronica Russell has been instructed to work up to a goal of 150 minutes of combined cardio and strengthening exercise per week for weight loss and overall health benefits. We discussed the following Behavioral Modification Strategies today: increasing lean protein intake and keep a strict food journal  Veronica Russell has agreed to follow up with our clinic in 2 weeks. She was informed of the importance of frequent follow up visits to maximize her success with intensive lifestyle modifications for her multiple health conditions.  I, Veronica Russell, am acting as transcriptionist for Quillian Quince, MD  I have reviewed the above documentation for accuracy and completeness, and I agree with the above. -Quillian Quince, MD   OBESITY BEHAVIORAL INTERVENTION VISIT  Today's visit was # 13 out of 22.  Starting weight: 259 lbs Starting date: 01/27/16 Today's weight : 249 lbs  Today's date: 08/18/2016 Total lbs lost to date: 10 (Patients must lose 7 lbs in the first 6 months to continue with counseling)   ASK: We discussed the diagnosis of obesity with Walker Shadow today and Chip Boer agreed to give [name] permission to discuss obesity behavioral modification therapy today.  ASSESS: Seidy has the diagnosis of obesity and her BMI today is 42.8 Crissa is in the action stage of change   ADVISE: Azari was educated on the multiple health risks of  obesity as well as the benefit of weight loss to improve her health. She was advised of the need for  long term treatment and the importance of lifestyle modifications.  AGREE: Multiple dietary modification options and treatment options were discussed and  Tynlee agreed to keep a food journal with 400 to 500 calories and 40+ grams of protein at supper daily and follow the Category 2 plan We discussed the following Behavioral Modification Strategies today: increasing lean protein intake and keep a strict food journal

## 2016-08-24 ENCOUNTER — Encounter (INDEPENDENT_AMBULATORY_CARE_PROVIDER_SITE_OTHER): Payer: Self-pay

## 2016-08-31 DIAGNOSIS — H52223 Regular astigmatism, bilateral: Secondary | ICD-10-CM | POA: Diagnosis not present

## 2016-08-31 DIAGNOSIS — H33309 Unspecified retinal break, unspecified eye: Secondary | ICD-10-CM | POA: Diagnosis not present

## 2016-08-31 DIAGNOSIS — H04123 Dry eye syndrome of bilateral lacrimal glands: Secondary | ICD-10-CM | POA: Diagnosis not present

## 2016-08-31 DIAGNOSIS — H5213 Myopia, bilateral: Secondary | ICD-10-CM | POA: Diagnosis not present

## 2016-08-31 DIAGNOSIS — H40051 Ocular hypertension, right eye: Secondary | ICD-10-CM | POA: Diagnosis not present

## 2016-08-31 DIAGNOSIS — H2513 Age-related nuclear cataract, bilateral: Secondary | ICD-10-CM | POA: Diagnosis not present

## 2016-08-31 DIAGNOSIS — H43813 Vitreous degeneration, bilateral: Secondary | ICD-10-CM | POA: Diagnosis not present

## 2016-08-31 DIAGNOSIS — E119 Type 2 diabetes mellitus without complications: Secondary | ICD-10-CM | POA: Diagnosis not present

## 2016-08-31 DIAGNOSIS — Z7984 Long term (current) use of oral hypoglycemic drugs: Secondary | ICD-10-CM | POA: Diagnosis not present

## 2016-09-01 ENCOUNTER — Ambulatory Visit (INDEPENDENT_AMBULATORY_CARE_PROVIDER_SITE_OTHER): Payer: 59 | Admitting: Physician Assistant

## 2016-09-01 VITALS — BP 106/70 | HR 91 | Temp 98.2°F | Ht 64.0 in | Wt 246.0 lb

## 2016-09-01 DIAGNOSIS — Z9189 Other specified personal risk factors, not elsewhere classified: Secondary | ICD-10-CM | POA: Diagnosis not present

## 2016-09-01 DIAGNOSIS — E669 Obesity, unspecified: Secondary | ICD-10-CM

## 2016-09-01 DIAGNOSIS — IMO0001 Reserved for inherently not codable concepts without codable children: Secondary | ICD-10-CM

## 2016-09-01 DIAGNOSIS — I10 Essential (primary) hypertension: Secondary | ICD-10-CM | POA: Diagnosis not present

## 2016-09-01 DIAGNOSIS — Z6841 Body Mass Index (BMI) 40.0 and over, adult: Secondary | ICD-10-CM | POA: Diagnosis not present

## 2016-09-01 DIAGNOSIS — E119 Type 2 diabetes mellitus without complications: Secondary | ICD-10-CM

## 2016-09-01 DIAGNOSIS — Z794 Long term (current) use of insulin: Secondary | ICD-10-CM

## 2016-09-01 MED ORDER — LIRAGLUTIDE 18 MG/3ML ~~LOC~~ SOPN: 1.2000 mg | PEN_INJECTOR | Freq: Every day | SUBCUTANEOUS | 0 refills | Status: DC

## 2016-09-01 MED FILL — VICTOZA 2-PAK 18 MG/3 ML PE: 18 | 30 days supply | Qty: 6 | Fill #0

## 2016-09-01 NOTE — Progress Notes (Signed)
Office: [phone]  /  Fax: [phone]   HPI:   Chief Complaint: OBESITY Veronica Russell is here to discuss her progress with her obesity treatment plan. She is on the  follow the Category 2 plan and is following her eating plan approximately 75 % of the time. She states she is walking for 20 minutes 3 times per week. Veronica Russell continues to do well with weight loss. She plans ahead well, makes smarter food choices and controls her portions.  Her weight is 246 lb (111.6 kg) today and has had a weight loss of 3 pounds over a period of 2 weeks since her last visit. She has lost 13 lbs since starting treatment with [name].  Diabetes II Veronica Russell has a diagnosis of diabetes type II. Veronica Russell states BGs have been labile ranging between 90 and 118 and denies any hypoglycemic episodes. Last A1c was 6.5 on 05/09/16.  She has been working on intensive lifestyle modifications including diet, exercise, and weight loss to help control her blood glucose levels.  Hypertension We discussed sodium restriction, working on healthy weight loss, and a regular exercise program as the means to achieve improved blood pressure control. Veronica Russell agreed with this plan and agreed to follow up as directed. We will continue to monitor her blood pressure as well as her progress with the above lifestyle modifications. She will continue her medications as prescribed and will watch for signs of hypotension as she continues her lifestyle modifications.  At risk for cardiovascular disease Veronica Russell is at a higher than average risk for cardiovascular disease due to obesity. She currently denies any chest pain.   ALLERGIES: Allergies  Allergen Reactions  . Milk-Related Compounds     Lactose intollerance    MEDICATIONS: Current Outpatient Prescriptions on File Prior to Visit  Medication Sig Dispense Refill  . Alcohol Swabs (ALCOHOL WIPES) 70 % PADS 30 Packages by Does not apply route 2 (two) times daily. 100 each 0  . amLODipine (NORVASC) 5 MG  tablet Take 5 mg by mouth daily.    Marland Kitchen atorvastatin (LIPITOR) 20 MG tablet Take 20 mg by mouth daily.    . calcium-vitamin D (OSCAL WITH D) 500-200 MG-UNIT tablet Take 2 tablets by mouth every morning. 60 tablet 0  . cholecalciferol (VITAMIN D) 1000 units tablet Take 1,000 Units by mouth daily.    . Clobetasol Propionate 0.05 % lotion Reported on 07/21/2015    . glucose blood (ACCU-CHEK GUIDE) test strip Use twice daily 100 each 0  . Insulin Pen Needle (BD PEN NEEDLE NANO U/F) 32G X 4 MM MISC 1 Package by Does not apply route daily. 100 each 0  . Lancets (ACCU-CHEK SAFE-T PRO) lancets Use as instructed 100 each 0  . lisinopril-hydrochlorothiazide (PRINZIDE,ZESTORETIC) 20-12.5 MG per tablet Take 1 tablet by mouth 2 (two) times daily.     . Melatonin 3 MG TABS Take 1 tablet by mouth at bedtime as needed.    . metFORMIN (GLUCOPHAGE-XR) 500 MG 24 hr tablet Take 500 mg by mouth 2 (two) times daily.    . prednisoLONE (PRELONE) 15 MG/5ML SOLN Take 15 mg by mouth. Reported on 07/21/2015    . propranolol (INDERAL) 20 MG tablet Take 20 mg by mouth 2 (two) times daily.      No current facility-administered medications on file prior to visit.     PAST MEDICAL HISTORY: Past Medical History:  Diagnosis Date  . Alopecia   . Asthma   . Back pain   . Diabetes mellitus ([place])   .  GERD (gastroesophageal reflux disease)   . Hyperlipidemia   . Hypertension   . Joint pain   . Lactose intolerance   . Obesity   . Raynauds syndrome   . Swelling   . Tremor   . Vitamin D deficiency     PAST SURGICAL HISTORY: Past Surgical History:  Procedure Laterality Date  . ABDOMINAL HYSTERECTOMY    . MYOMECTOMY      SOCIAL HISTORY: Social History  Substance Use Topics  . Smoking status: Never Smoker  . Smokeless tobacco: Never Used  . Alcohol use Not on file    FAMILY HISTORY: Family History  Problem Relation Age of Onset  . Multiple myeloma Mother   . Hypertension Mother   . Hyperlipidemia Mother   .  Cancer Mother   . Obesity Mother   . Hypertension Father   . Hyperlipidemia Father   . Cancer Father   . Asthma Other   . Hyperlipidemia Other   . Hypertension Other   . Cancer Other   . COPD Other   . Stroke Other     ROS: Review of Systems  Constitutional: Positive for weight loss.  Respiratory: Negative for shortness of breath.   Cardiovascular: Negative for chest pain.  Endo/Heme/Allergies:       Polyphagia    PHYSICAL EXAM: Blood pressure 106/70, pulse 91, temperature 98.2 F (36.8 C), temperature source Oral, height '5\' 4"'$  (1.626 m), weight 246 lb (111.6 kg), last menstrual period 08/31/2008, SpO2 95 %. Body mass index is 42.23 kg/m. Physical Exam  Constitutional: She is oriented to person, place, and time. She appears well-developed and well-nourished.  Cardiovascular: Normal rate.   Pulmonary/Chest: Effort normal.  Neurological: She is alert and oriented to person, place, and time.  Skin: Skin is warm and dry.  Psychiatric: She has a normal mood and affect.    RECENT LABS AND TESTS: BMET    Component Value Date/Time   NA 141 05/09/2016 1005   K 4.3 05/09/2016 1005   CL 100 05/09/2016 1005   CO2 25 05/09/2016 1005   GLUCOSE 108 (H) 05/09/2016 1005   GLUCOSE 99 09/10/2009 0540   BUN 15 05/09/2016 1005   CREATININE 0.72 05/09/2016 1005   CALCIUM 10.0 05/09/2016 1005   GFRNONAA 94 05/09/2016 1005   GFRAA 108 05/09/2016 1005   Lab Results  Component Value Date   HGBA1C 6.5 (H) 05/09/2016   HGBA1C 6.7 (H) 01/27/2016   HGBA1C 6.9 01/12/2016   HGBA1C 6.6 07/21/2015   HGBA1C 6.8 01/07/2015   Lab Results  Component Value Date   INSULIN 38.5 (H) 05/09/2016   INSULIN 27.3 (H) 01/27/2016   CBC    Component Value Date/Time   WBC 6.8 01/27/2016 1006   WBC 11.1 (H) 09/10/2009 0540   RBC 4.28 01/27/2016 1006   RBC 3.89 09/10/2009 0540   HGB 12.1 01/27/2016 1006   HCT 37.0 01/27/2016 1006   PLT 241 09/10/2009 0540   MCV 86 01/27/2016 1006   MCH 28.3  01/27/2016 1006   MCH 29.0 09/10/2009 0540   MCHC 32.7 01/27/2016 1006   MCHC 33.0 09/10/2009 0540   RDW 13.3 01/27/2016 1006   LYMPHSABS 2.0 01/27/2016 1006   EOSABS 0.2 01/27/2016 1006   BASOSABS 0.0 01/27/2016 1006   Iron/TIBC/Ferritin/ %Sat No results found for: IRON, TIBC, FERRITIN, IRONPCTSAT Lipid Panel     Component Value Date/Time   CHOL 162 01/27/2016 1006   TRIG 133 01/27/2016 1006   HDL 51 01/27/2016 1006  LDLCALC 84 01/27/2016 1006   Hepatic Function Panel     Component Value Date/Time   PROT 6.9 05/09/2016 1005   ALBUMIN 4.1 05/09/2016 1005   AST 16 05/09/2016 1005   ALT 22 05/09/2016 1005   ALKPHOS 90 05/09/2016 1005   BILITOT 0.3 05/09/2016 1005      Component Value Date/Time   TSH 0.443 (L) 05/09/2016 1005   TSH 0.425 (L) 01/27/2016 1006    ASSESSMENT AND PLAN: Type 2 diabetes mellitus without complication, with long-term current use of insulin ([facility]) - Plan: liraglutide 18 MG/3ML SOPN  Essential hypertension  At risk for heart disease  Class 3 obesity with serious comorbidity and body mass index (BMI) of 40.0 to 44.9 in adult, unspecified obesity type ([place])  PLAN:  Diabetes II Veronica Russell has been given extensive diabetes education by myself today including ideal fasting and post-prandial blood glucose readings, individual ideal HgA1c goals  and hypoglycemia prevention. We discussed the importance of good blood sugar control to decrease the likelihood of diabetic complications such as nephropathy, neuropathy, limb loss, blindness, coronary artery disease, and death. We discussed the importance of intensive lifestyle modification including diet, exercise and weight loss as the first line treatment for diabetes. Veronica Russell agrees to continue Victoza 1.'2mg'$  qd,  and will follow up at the agreed upon time.  Hypertension We discussed sodium restriction, working on healthy weight loss, and a regular exercise program as the means to achieve improved blood pressure  control. Veronica Russell agreed with this plan and agreed to follow up as directed. We will continue to monitor her blood pressure as well as her progress with the above lifestyle modifications. She will continue her medications as prescribed and will watch for signs of hypotension as she continues her lifestyle modifications.  Cardiovascular risk counselling Veronica Russell was given extended (15 minutes) coronary artery disease prevention counseling today. She is 56 y.o. female and has risk factors for heart disease including obesity. We discussed intensive lifestyle modifications today with an emphasis on specific weight loss instructions and strategies. Pt was also informed of the importance of increasing exercise and decreasing saturated fats to help prevent heart disease.  Obesity Veronica Russell is currently in the action stage of change. As such, her goal is to continue with weight loss efforts She has agreed to follow the Category 2 plan Veronica Russell has been instructed to work up to a goal of 150 minutes of combined cardio and strengthening exercise per week for weight loss and overall health benefits. We discussed the following Behavioral Modification Stratagies today: increasing lean protein intake and decrease eating out   Makenleigh has agreed to follow up with our clinic in 2 weeks. She was informed of the importance of frequent follow up visits to maximize her success with intensive lifestyle modifications for her multiple health conditions.   Office: [phone]  /  Fax: [phone]  OBESITY BEHAVIORAL INTERVENTION VISIT  Today's visit was # 14 out of 22.  Starting weight: 259 Starting date: 01/26/17 Today's weight : Weight: 246 lb (111.6 kg)  Today's date: 09/01/2016 Total lbs lost to date: 13 (Patients must lose 7 lbs in the first 6 months to continue with counseling)   ASK: We discussed the diagnosis of obesity with Veronica Russell today and Veronica Russell agreed to give [name] permission to discuss obesity behavioral  modification therapy today.  ASSESS: Veronica Russell has the diagnosis of obesity and her BMI today is 42.3 Veronica Russell is in the action stage of change   ADVISE: Veronica Russell was educated  on the multiple health risks of obesity as well as the benefit of weight loss to improve her health. She was advised of the need for long term treatment and the importance of lifestyle modifications.  AGREE: Multiple dietary modification options and treatment options were discussed and  Veronica Russell agreed to follow the Category 2 plan We discussed the following Behavioral Modification Stratagies today: increasing lean protein intake and decrease eating out    I have reviewed the above documentation for accuracy and completeness, and I agree with the above. -Veronica Duverney, PA-C  I have reviewed the above note and agree with the plan. -Veronica Nip, MD

## 2016-09-07 ENCOUNTER — Ambulatory Visit
Admission: RE | Admit: 2016-09-07 | Discharge: 2016-09-07 | Disposition: A | Discharge status: Home / Self Care | Payer: 59 | Source: Ambulatory Visit | Attending: Family Medicine | Admitting: Family Medicine

## 2016-09-07 DIAGNOSIS — Z1231 Encounter for screening mammogram for malignant neoplasm of breast: Secondary | ICD-10-CM | POA: Diagnosis not present

## 2016-09-14 ENCOUNTER — Ambulatory Visit (INDEPENDENT_AMBULATORY_CARE_PROVIDER_SITE_OTHER): Payer: 59 | Admitting: Physician Assistant

## 2016-09-14 VITALS — BP 100/71 | HR 91 | Temp 98.6°F | Ht 64.0 in | Wt 247.0 lb

## 2016-09-14 DIAGNOSIS — E119 Type 2 diabetes mellitus without complications: Secondary | ICD-10-CM

## 2016-09-14 DIAGNOSIS — Z794 Long term (current) use of insulin: Secondary | ICD-10-CM

## 2016-09-14 DIAGNOSIS — Z6841 Body Mass Index (BMI) 40.0 and over, adult: Secondary | ICD-10-CM | POA: Diagnosis not present

## 2016-09-14 DIAGNOSIS — I10 Essential (primary) hypertension: Secondary | ICD-10-CM

## 2016-09-14 DIAGNOSIS — E669 Obesity, unspecified: Secondary | ICD-10-CM

## 2016-09-14 DIAGNOSIS — IMO0001 Reserved for inherently not codable concepts without codable children: Secondary | ICD-10-CM

## 2016-09-14 MED ORDER — INSULIN PEN NEEDLE 32G X 4 MM MISC: 1.0000 | Freq: Every day | 0 refills | Status: DC

## 2016-09-15 ENCOUNTER — Other Ambulatory Visit (INDEPENDENT_AMBULATORY_CARE_PROVIDER_SITE_OTHER): Payer: Self-pay | Admitting: Family Medicine

## 2016-09-15 DIAGNOSIS — Z794 Long term (current) use of insulin: Principal | ICD-10-CM

## 2016-09-15 DIAGNOSIS — E119 Type 2 diabetes mellitus without complications: Secondary | ICD-10-CM

## 2016-09-15 MED ORDER — GLUCOSE BLOOD VI STRP: ORAL_STRIP | 0 refills | Status: DC

## 2016-09-15 MED ORDER — ACCU-CHEK SAFE-T PRO LANCETS MISC: 0 refills | Status: DC

## 2016-09-15 MED ORDER — INSULIN PEN NEEDLE 32G X 4 MM MISC: 1.0000 | Freq: Every day | 0 refills | Status: DC

## 2016-09-15 MED ORDER — ALCOHOL WIPES 70 % PADS: 30.0000 | MEDICATED_PAD | Freq: Two times a day (BID) | 0 refills | Status: DC

## 2016-09-15 MED FILL — SM ALCOHOL 70% PREP PADS: 70 | 50 days supply | Qty: 100 | Fill #0

## 2016-09-15 MED FILL — UNIFINE PENTIPS 32GX5/32: 32G X 4 MM | 90 days supply | Qty: 100 | Fill #0

## 2016-09-15 MED FILL — ACCU-CHEK FASTCLIX LANCETS: 51 days supply | Qty: 102 | Fill #0

## 2016-09-15 MED FILL — ACCU-CHEK GUIDE TEST STRIP: 50 days supply | Qty: 100 | Fill #0

## 2016-09-15 MED FILL — UNIFINE PENTIPS 32GX5/32": 32G X 4 MM | 90 days supply | Qty: 100 | Fill #0

## 2016-09-15 MED FILL — NYSTOP 100,000 UNITS/GM PWD: 100000 | 14 days supply | Qty: 60 | Fill #1

## 2016-09-15 NOTE — Progress Notes (Signed)
Office: [phone]  /  Fax: [phone]   HPI:   Chief Complaint: OBESITY Veronica Russell is here to discuss her progress with her obesity treatment plan. She is on the  follow the Category 2 plan and is following her eating plan approximately 70 % of the time. She states she is walking for 30 minutes 3 times per week. Martasia had increase celebration eating and did not follow the plan as well. She has made smarter food choices and controlled her portions when she ate out. She is ready to get back on track.  Her weight is 247 lb (112 kg) today and gained 1 pound since her last visit. She has lost 12 lbs since starting treatment with [name].  Diabetes II Rebeka has a diagnosis of diabetes type II. Nayah states BGs range between 90 and 100 and denies any hypoglycemic episodes. Last A1c was 6.5 on 05/09/16.  She has been working on intensive lifestyle modifications including diet, exercise, and weight loss to help control her blood glucose levels.  Hypertension MALLORY SCHAAD is a 56 y.o. female with hypertension.  [name] denies chest pain or shortness of breath on exertion. She is working weight loss to help control her blood pressure with the goal of decreasing her risk of heart attack and stroke. [name] is currently controlled.      ALLERGIES: Allergies  Allergen Reactions  . Milk-Related Compounds     Lactose intollerance    MEDICATIONS: Current Outpatient Prescriptions on File Prior to Visit  Medication Sig Dispense Refill  . Alcohol Swabs (ALCOHOL WIPES) 70 % PADS 30 Packages by Does not apply route 2 (two) times daily. 100 each 0  . amLODipine (NORVASC) 5 MG tablet Take 5 mg by mouth daily.    Marland Kitchen atorvastatin (LIPITOR) 20 MG tablet Take 20 mg by mouth daily.    . calcium-vitamin D (OSCAL WITH D) 500-200 MG-UNIT tablet Take 2 tablets by mouth every morning. 60 tablet 0  . cholecalciferol (VITAMIN D) 1000 units tablet Take 1,000 Units by mouth daily.    . Clobetasol  Propionate 0.05 % lotion Reported on 07/21/2015    . glucose blood (ACCU-CHEK GUIDE) test strip Use twice daily 100 each 0  . Lancets (ACCU-CHEK SAFE-T PRO) lancets Use as instructed 100 each 0  . liraglutide 18 MG/3ML SOPN Inject 0.2 mLs (1.2 mg total) into the skin daily. 2 pen 0  . lisinopril-hydrochlorothiazide (PRINZIDE,ZESTORETIC) 20-12.5 MG per tablet Take 1 tablet by mouth 2 (two) times daily.     . Melatonin 3 MG TABS Take 1 tablet by mouth at bedtime as needed.    . metFORMIN (GLUCOPHAGE-XR) 500 MG 24 hr tablet Take 500 mg by mouth 2 (two) times daily.    . prednisoLONE (PRELONE) 15 MG/5ML SOLN Take 15 mg by mouth. Reported on 07/21/2015    . propranolol (INDERAL) 20 MG tablet Take 20 mg by mouth 2 (two) times daily.      No current facility-administered medications on file prior to visit.     PAST MEDICAL HISTORY: Past Medical History:  Diagnosis Date  . Alopecia   . Asthma   . Back pain   . Diabetes mellitus ([place])   . GERD (gastroesophageal reflux disease)   . Hyperlipidemia   . Hypertension   . Joint pain   . Lactose intolerance   . Obesity   . Raynauds syndrome   . Swelling   . Tremor   . Vitamin D deficiency  PAST SURGICAL HISTORY: Past Surgical History:  Procedure Laterality Date  . ABDOMINAL HYSTERECTOMY    . BREAST BIOPSY    . MYOMECTOMY      SOCIAL HISTORY: Social History  Substance Use Topics  . Smoking status: Never Smoker  . Smokeless tobacco: Never Used  . Alcohol use Not on file    FAMILY HISTORY: Family History  Problem Relation Age of Onset  . Multiple myeloma Mother   . Hypertension Mother   . Hyperlipidemia Mother   . Cancer Mother   . Obesity Mother   . Hypertension Father   . Hyperlipidemia Father   . Cancer Father   . Asthma Other   . Hyperlipidemia Other   . Hypertension Other   . Cancer Other   . COPD Other   . Stroke Other   . Breast cancer Maternal Aunt   . Breast cancer Paternal Aunt   . Breast cancer Maternal  Aunt     ROS: ROS  PHYSICAL EXAM: Blood pressure 100/71, pulse 91, temperature 98.6 F (37 C), temperature source Oral, height '5\' 4"'$  (1.626 m), weight 247 lb (112 kg), last menstrual period 08/31/2008, SpO2 98 %. Body mass index is 42.4 kg/m. Physical Exam  RECENT LABS AND TESTS: BMET    Component Value Date/Time   NA 141 05/09/2016 1005   K 4.3 05/09/2016 1005   CL 100 05/09/2016 1005   CO2 25 05/09/2016 1005   GLUCOSE 108 (H) 05/09/2016 1005   GLUCOSE 99 09/10/2009 0540   BUN 15 05/09/2016 1005   CREATININE 0.72 05/09/2016 1005   CALCIUM 10.0 05/09/2016 1005   GFRNONAA 94 05/09/2016 1005   GFRAA 108 05/09/2016 1005   Lab Results  Component Value Date   HGBA1C 6.5 (H) 05/09/2016   HGBA1C 6.7 (H) 01/27/2016   HGBA1C 6.9 01/12/2016   HGBA1C 6.6 07/21/2015   HGBA1C 6.8 01/07/2015   Lab Results  Component Value Date   INSULIN 38.5 (H) 05/09/2016   INSULIN 27.3 (H) 01/27/2016   CBC    Component Value Date/Time   WBC 6.8 01/27/2016 1006   WBC 11.1 (H) 09/10/2009 0540   RBC 4.28 01/27/2016 1006   RBC 3.89 09/10/2009 0540   HGB 12.1 01/27/2016 1006   HCT 37.0 01/27/2016 1006   PLT 241 09/10/2009 0540   MCV 86 01/27/2016 1006   MCH 28.3 01/27/2016 1006   MCH 29.0 09/10/2009 0540   MCHC 32.7 01/27/2016 1006   MCHC 33.0 09/10/2009 0540   RDW 13.3 01/27/2016 1006   LYMPHSABS 2.0 01/27/2016 1006   EOSABS 0.2 01/27/2016 1006   BASOSABS 0.0 01/27/2016 1006   Iron/TIBC/Ferritin/ %Sat No results found for: IRON, TIBC, FERRITIN, IRONPCTSAT Lipid Panel     Component Value Date/Time   CHOL 162 01/27/2016 1006   TRIG 133 01/27/2016 1006   HDL 51 01/27/2016 1006   LDLCALC 84 01/27/2016 1006   Hepatic Function Panel     Component Value Date/Time   PROT 6.9 05/09/2016 1005   ALBUMIN 4.1 05/09/2016 1005   AST 16 05/09/2016 1005   ALT 22 05/09/2016 1005   ALKPHOS 90 05/09/2016 1005   BILITOT 0.3 05/09/2016 1005      Component Value Date/Time   TSH 0.443 (L)  05/09/2016 1005   TSH 0.425 (L) 01/27/2016 1006    ASSESSMENT AND PLAN: Type 2 diabetes mellitus without complication, with long-term current use of insulin ([place]) - Plan: Insulin Pen Needle (BD PEN NEEDLE NANO U/F) 32G X 4 MM MISC  Essential hypertension  Class 3 obesity with serious comorbidity and body mass index (BMI) of 40.0 to 44.9 in adult, unspecified obesity type ([place])  PLAN:  Diabetes II Desirie has been given extensive diabetes education by myself today including ideal fasting and post-prandial blood glucose readings, individual ideal HgA1c goals  and hypoglycemia prevention. We discussed the importance of good blood sugar control to decrease the likelihood of diabetic complications such as nephropathy, neuropathy, limb loss, blindness, coronary artery disease, and death. We discussed the importance of intensive lifestyle modification including diet, exercise and weight loss as the first line treatment for diabetes. Princetta agrees to continue her diabetes medications, and we refilled Insulin pen needles, and will follow up at the agreed upon time.  Hypertension We discussed sodium restriction, working on healthy weight loss, and a regular exercise program as the means to achieve improved blood pressure control. Jetta agreed with this plan and agreed to follow up as directed. We will continue to monitor her blood pressure as well as her progress with the above lifestyle modifications. She will continue her medications as prescribed and will watch for signs of hypotension as she continues her lifestyle modifications.  Obesity Janalyn is currently in the action stage of change. As such, her goal is to continue with weight loss efforts She has agreed to follow the Category 2 plan Warrene has been instructed to work up to a goal of 150 minutes of combined cardio and strengthening exercise per week for weight loss and overall health benefits. We discussed the following Behavioral Modification  Stratagies today: increasing lean protein intake  Annaleigha has agreed to follow up with our clinic in 2 weeks. She was informed of the importance of frequent follow up visits to maximize her success with intensive lifestyle modifications for her multiple health conditions.   Office: [phone]  /  Fax: [phone]  OBESITY BEHAVIORAL INTERVENTION VISIT  Today's visit was # 15 out of 22.  Starting weight: 259 Starting date: 01/26/17 Today's weight : Weight: 247 lb (112 kg)  Today's date: 09/15/2016 Total lbs lost to date: 12 (Patients must lose 7 lbs in the first 6 months to continue with counseling)   ASK: We discussed the diagnosis of obesity with [name] today and [name] agreed to give [name] permission to discuss obesity behavioral modification therapy today.  ASSESS: Dasja has the diagnosis of obesity and her BMI today is 69.5 Nabeeha is in the action stage of change   ADVISE: Lekha was educated on the multiple health risks of obesity as well as the benefit of weight loss to improve her health. She was advised of the need for long term treatment and the importance of lifestyle modifications.  AGREE: Multiple dietary modification options and treatment options were discussed and  Julliette agreed to follow the Category 2 plan We discussed the following Behavioral Modification Stratagies today: increasing lean protein intake and celebration eating strategies.    I have reviewed the above documentation for accuracy and completeness, and I agree with the above. -Lacy Duverney, PA-C  I have reviewed the above note and agree with the plan. -Dennard Nip, MD

## 2016-09-29 ENCOUNTER — Ambulatory Visit (INDEPENDENT_AMBULATORY_CARE_PROVIDER_SITE_OTHER): Payer: 59 | Admitting: Physician Assistant

## 2016-09-29 VITALS — BP 105/71 | HR 90 | Temp 98.7°F | Ht 64.0 in | Wt 246.0 lb

## 2016-09-29 DIAGNOSIS — E7849 Other hyperlipidemia: Secondary | ICD-10-CM

## 2016-09-29 DIAGNOSIS — E669 Obesity, unspecified: Secondary | ICD-10-CM

## 2016-09-29 DIAGNOSIS — Z6841 Body Mass Index (BMI) 40.0 and over, adult: Secondary | ICD-10-CM

## 2016-09-29 DIAGNOSIS — R5383 Other fatigue: Secondary | ICD-10-CM | POA: Diagnosis not present

## 2016-09-29 DIAGNOSIS — IMO0001 Reserved for inherently not codable concepts without codable children: Secondary | ICD-10-CM

## 2016-09-29 DIAGNOSIS — E119 Type 2 diabetes mellitus without complications: Secondary | ICD-10-CM | POA: Diagnosis not present

## 2016-09-29 DIAGNOSIS — I10 Essential (primary) hypertension: Secondary | ICD-10-CM | POA: Diagnosis not present

## 2016-09-29 DIAGNOSIS — E784 Other hyperlipidemia: Secondary | ICD-10-CM

## 2016-09-29 MED ORDER — LIRAGLUTIDE 18 MG/3ML ~~LOC~~ SOPN: 1.2000 mg | PEN_INJECTOR | Freq: Every day | SUBCUTANEOUS | 0 refills | Status: DC

## 2016-09-29 MED FILL — VICTOZA 2-PAK 18 MG/3 ML PE: 18 | 30 days supply | Qty: 6 | Fill #0

## 2016-09-29 NOTE — Progress Notes (Signed)
Office: [phone]  /  Fax: [phone]   HPI:   Chief Complaint: OBESITY Veronica Russell is here to discuss her progress with her obesity treatment plan. She is on the Category 2 plan and is following her eating plan approximately 70 % of the time. She states she is walking for 30 minutes 2 to 3 times per week. Veronica Russell continues to do well with weight loss. She has been eating out more but is making smarter food choices and is controlling her portions. Her weight is 246 lb (111.6 kg) today and has had a weight loss of 1 pound over a period of 2 weeks since her last visit. She has lost 13 lbs since starting treatment with [name].  Fatigue [name] feels her energy is lower than it should be. This has worsened with weight gain and has worsened recently. TSH in April was low and T3/T4 was within normal range. Alysah denies palpitations or constipation.   Hypertension Veronica Russell is a 56 y.o. female with hypertension. Veronica Russell denies chest pain or shortness of breath on exertion. She is working weight loss to help control her blood pressure with the goal of decreasing her risk of heart attack and stroke. Veronica Russell blood pressure is currently controlled.  Diabetes II Veronica Russell has a diagnosis of diabetes type II. Veronica Russell states fasting BGs range between 100 and 110's and denies any hypoglycemic episodes. She has been working on intensive lifestyle modifications including diet, exercise, and weight loss to help control her blood glucose levels.  Hyperlipidemia Veronica Russell has hyperlipidemia and is not currently on a statin. She has been trying to improve her cholesterol levels with intensive lifestyle modification including a low saturated fat diet, exercise and weight loss. She denies any chest pain, claudication or myalgias.  ALLERGIES: Allergies  Allergen Reactions  . Milk-Related Compounds     Lactose intollerance    MEDICATIONS: Current Outpatient Prescriptions on File Prior to Visit  Medication Sig  Dispense Refill  . Alcohol Swabs (ALCOHOL WIPES) 70 % PADS 30 Packages by Does not apply route 2 (two) times daily. 100 each 0  . amLODipine (NORVASC) 5 MG tablet Take 5 mg by mouth daily.    Marland Kitchen atorvastatin (LIPITOR) 20 MG tablet Take 20 mg by mouth daily.    . calcium-vitamin D (OSCAL WITH D) 500-200 MG-UNIT tablet Take 2 tablets by mouth every morning. 60 tablet 0  . cholecalciferol (VITAMIN D) 1000 units tablet Take 1,000 Units by mouth daily.    . Clobetasol Propionate 0.05 % lotion Reported on 07/21/2015    . glucose blood (ACCU-CHEK GUIDE) test strip Use twice daily 100 each 0  . Insulin Pen Needle (BD PEN NEEDLE NANO U/F) 32G X 4 MM MISC 1 Package by Does not apply route daily. 100 each 0  . Lancets (ACCU-CHEK SAFE-T PRO) lancets Use as instructed 100 each 0  . lisinopril-hydrochlorothiazide (PRINZIDE,ZESTORETIC) 20-12.5 MG per tablet Take 1 tablet by mouth 2 (two) times daily.     . Melatonin 3 MG TABS Take 1 tablet by mouth at bedtime as needed.    . metFORMIN (GLUCOPHAGE-XR) 500 MG 24 hr tablet Take 500 mg by mouth 2 (two) times daily.    . prednisoLONE (PRELONE) 15 MG/5ML SOLN Take 15 mg by mouth. Reported on 07/21/2015    . propranolol (INDERAL) 20 MG tablet Take 20 mg by mouth 2 (two) times daily.      No current facility-administered medications on file prior to visit.  PAST MEDICAL HISTORY: Past Medical History:  Diagnosis Date  . Alopecia   . Asthma   . Back pain   . Diabetes mellitus ([place])   . GERD (gastroesophageal reflux disease)   . Hyperlipidemia   . Hypertension   . Joint pain   . Lactose intolerance   . Obesity   . Raynauds syndrome   . Swelling   . Tremor   . Vitamin D deficiency     PAST SURGICAL HISTORY: Past Surgical History:  Procedure Laterality Date  . ABDOMINAL HYSTERECTOMY    . BREAST BIOPSY    . MYOMECTOMY      SOCIAL HISTORY: Social History  Substance Use Topics  . Smoking status: Never Smoker  . Smokeless tobacco: Never Used  .  Alcohol use Not on file    FAMILY HISTORY: Family History  Problem Relation Age of Onset  . Multiple myeloma Mother   . Hypertension Mother   . Hyperlipidemia Mother   . Cancer Mother   . Obesity Mother   . Hypertension Father   . Hyperlipidemia Father   . Cancer Father   . Asthma Other   . Hyperlipidemia Other   . Hypertension Other   . Cancer Other   . COPD Other   . Stroke Other   . Breast cancer Maternal Aunt   . Breast cancer Paternal Aunt   . Breast cancer Maternal Aunt     ROS: Review of Systems  Constitutional: Positive for malaise/fatigue and weight loss.  Respiratory: Negative for shortness of breath (on exertion).   Cardiovascular: Negative for chest pain, palpitations and claudication.  Gastrointestinal: Negative for constipation.  Musculoskeletal: Negative for myalgias.  Endo/Heme/Allergies:       Negative hypoglycemia    PHYSICAL EXAM: Blood pressure 105/71, pulse 90, temperature 98.7 F (37.1 C), temperature source Oral, height 5' 4"  (1.626 m), weight 246 lb (111.6 kg), last menstrual period 08/31/2008, SpO2 97 %. Body mass index is 42.23 kg/m. Physical Exam  Constitutional: She is oriented to person, place, and time. She appears well-developed and well-nourished.  Cardiovascular: Normal rate.   Pulmonary/Chest: Effort normal.  Musculoskeletal: Normal range of motion.  Neurological: She is oriented to person, place, and time.  Skin: Skin is warm and dry.  Psychiatric: She has a normal mood and affect. Her behavior is normal.  Vitals reviewed.   RECENT LABS AND TESTS: BMET    Component Value Date/Time   NA 141 05/09/2016 1005   K 4.3 05/09/2016 1005   CL 100 05/09/2016 1005   CO2 25 05/09/2016 1005   GLUCOSE 108 (H) 05/09/2016 1005   GLUCOSE 99 09/10/2009 0540   BUN 15 05/09/2016 1005   CREATININE 0.72 05/09/2016 1005   CALCIUM 10.0 05/09/2016 1005   GFRNONAA 94 05/09/2016 1005   GFRAA 108 05/09/2016 1005   Lab Results  Component  Value Date   HGBA1C 6.5 (H) 05/09/2016   HGBA1C 6.7 (H) 01/27/2016   HGBA1C 6.9 01/12/2016   HGBA1C 6.6 07/21/2015   HGBA1C 6.8 01/07/2015   Lab Results  Component Value Date   INSULIN 38.5 (H) 05/09/2016   INSULIN 27.3 (H) 01/27/2016   CBC    Component Value Date/Time   WBC 6.8 01/27/2016 1006   WBC 11.1 (H) 09/10/2009 0540   RBC 4.28 01/27/2016 1006   RBC 3.89 09/10/2009 0540   HGB 12.1 01/27/2016 1006   HCT 37.0 01/27/2016 1006   PLT 241 09/10/2009 0540   MCV 86 01/27/2016 1006   MCH 28.3  01/27/2016 1006   MCH 29.0 09/10/2009 0540   MCHC 32.7 01/27/2016 1006   MCHC 33.0 09/10/2009 0540   RDW 13.3 01/27/2016 1006   LYMPHSABS 2.0 01/27/2016 1006   EOSABS 0.2 01/27/2016 1006   BASOSABS 0.0 01/27/2016 1006   Iron/TIBC/Ferritin/ %Sat No results found for: IRON, TIBC, FERRITIN, IRONPCTSAT Lipid Panel     Component Value Date/Time   CHOL 162 01/27/2016 1006   TRIG 133 01/27/2016 1006   HDL 51 01/27/2016 1006   LDLCALC 84 01/27/2016 1006   Hepatic Function Panel     Component Value Date/Time   PROT 6.9 05/09/2016 1005   ALBUMIN 4.1 05/09/2016 1005   AST 16 05/09/2016 1005   ALT 22 05/09/2016 1005   ALKPHOS 90 05/09/2016 1005   BILITOT 0.3 05/09/2016 1005      Component Value Date/Time   TSH 0.443 (L) 05/09/2016 1005   TSH 0.425 (L) 01/27/2016 1006    ASSESSMENT AND PLAN: Type 2 diabetes mellitus without complication, without long-term current use of insulin ([place]) - Plan: Comprehensive metabolic panel, Hemoglobin A1c, Insulin, random, liraglutide 18 MG/3ML SOPN  Essential hypertension  Other hyperlipidemia - Plan: Lipid Panel With LDL/HDL Ratio  Other fatigue - Plan: VITAMIN D 25 Hydroxy (Vit-D Deficiency, Fractures), TSH, T4, free, T3  Class 3 obesity with serious comorbidity and body mass index (BMI) of 40.0 to 44.9 in adult, unspecified obesity type (HCC)  PLAN:  Fatigue Julietta was informed that her fatigue may be related to obesity, depression or  many other causes. Labs will be ordered, and in the meanwhile Arley has agreed to work on diet, exercise and weight loss to help with fatigue. Proper sleep hygiene was discussed including the need for 7-8 hours of quality sleep each night.  Hypertension We discussed sodium restriction, working on healthy weight loss, and a regular exercise program as the means to achieve improved blood pressure control. Neveyah agreed with this plan and agreed to follow up as directed. We will check labs and will continue to monitor her blood pressure as well as her progress with the above lifestyle modifications. She will continue her medications as prescribed and will watch for signs of hypotension as she continues her lifestyle modifications.  Diabetes II Zina has been given extensive diabetes education by myself today including ideal fasting and post-prandial blood glucose readings, individual ideal Hgb A1c goals  and hypoglycemia prevention. We discussed the importance of good blood sugar control to decrease the likelihood of diabetic complications such as nephropathy, neuropathy, limb loss, blindness, coronary artery disease, and death. We discussed the importance of intensive lifestyle modification including diet, exercise and weight loss as the first line treatment for diabetes. We will check labs and Timothy agrees to continue Victoza 1.2 mg, we will refill for 2 pens and she will follow up at the agreed upon time.  Hyperlipidemia Peggyann was informed of the American Heart Association Guidelines emphasizing intensive lifestyle modifications as the first line treatment for hyperlipidemia. We discussed many lifestyle modifications today in depth, and Loryn will continue to work on decreasing saturated fats such as fatty red meat, butter and many fried foods. She will also increase vegetables and lean protein in her diet and continue to work on exercise and weight loss efforts. We will check labs and she will continue her  medications as prescribed.  Obesity Jalana is currently in the action stage of change. As such, her goal is to continue with weight loss efforts She has agreed to follow the  Category 2 plan Cornie has been instructed to work up to a goal of 150 minutes of combined cardio and strengthening exercise per week for weight loss and overall health benefits. We discussed the following Behavioral Modification Strategies today: increasing lean protein intake and decrease eating out  Cory has agreed to follow up with our clinic in 3 weeks. She was informed of the importance of frequent follow up visits to maximize her success with intensive lifestyle modifications for her multiple health conditions.  I, Doreene Nest, am acting as transcriptionist for Lacy Duverney, PA-C  I have reviewed the above documentation for accuracy and completeness, and I agree with the above. -Lacy Duverney, PA-C  I have reviewed the above note and agree with the plan. -Dennard Nip, MD   OBESITY BEHAVIORAL INTERVENTION VISIT  Today's visit was # 16 out of 3.  Starting weight: 259 lbs Starting date: 01/27/16 Today's weight : 246 lbs Today's date: 09/29/2016 Total lbs lost to date: 13 (Patients must lose 7 lbs in the first 6 months to continue with counseling)   ASK: We discussed the diagnosis of obesity with Veronica Russell today and Kimetha agreed to give [name] permission to discuss obesity behavioral modification therapy today.  ASSESS: Jaylanni has the diagnosis of obesity and her BMI today is 42.21 Ammarie is in the action stage of change   ADVISE: Taegen was educated on the multiple health risks of obesity as well as the benefit of weight loss to improve her health. She was advised of the need for long term treatment and the importance of lifestyle modifications.  AGREE: Multiple dietary modification options and treatment options were discussed and  Fani agreed to follow the Category 2 plan We discussed the following  Behavioral Modification Strategies today: increasing lean protein intake and decrease eating out

## 2016-09-30 LAB — COMPREHENSIVE METABOLIC PANEL
ALBUMIN: 4.3 g/dL (ref 3.5–5.5)
ALK PHOS: 87 IU/L (ref 39–117)
ALT: 20 IU/L (ref 0–32)
AST: 15 IU/L (ref 0–40)
Albumin/Globulin Ratio: 1.6 (ref 1.2–2.2)
BILIRUBIN TOTAL: 0.3 mg/dL (ref 0.0–1.2)
BUN / CREAT RATIO: 20 (ref 9–23)
BUN: 15 mg/dL (ref 6–24)
CHLORIDE: 102 mmol/L (ref 96–106)
CO2: 23 mmol/L (ref 20–29)
CREATININE: 0.74 mg/dL (ref 0.57–1.00)
Calcium: 10.2 mg/dL (ref 8.7–10.2)
GFR calc Af Amer: 104 mL/min/{1.73_m2} (ref >59)
GFR calc non Af Amer: 90 mL/min/{1.73_m2} (ref >59)
GLUCOSE: 114 mg/dL — AB (ref 65–99)
Globulin, Total: 2.7 g/dL (ref 1.5–4.5)
Potassium: 4.1 mmol/L (ref 3.5–5.2)
Sodium: 143 mmol/L (ref 134–144)
Total Protein: 7 g/dL (ref 6.0–8.5)

## 2016-09-30 LAB — LIPID PANEL WITH LDL/HDL RATIO
CHOLESTEROL TOTAL: 162 mg/dL (ref 100–199)
HDL: 52 mg/dL (ref >39)
LDL CALC: 82 mg/dL (ref 0–99)
LDL/HDL RATIO: 1.6 ratio (ref 0.0–3.2)
TRIGLYCERIDES: 140 mg/dL (ref 0–149)
VLDL Cholesterol Cal: 28 mg/dL (ref 5–40)

## 2016-09-30 LAB — INSULIN, RANDOM: INSULIN: 39 u[IU]/mL — ABNORMAL HIGH (ref 2.6–24.9)

## 2016-09-30 LAB — HEMOGLOBIN A1C
ESTIMATED AVERAGE GLUCOSE: 143 mg/dL
HEMOGLOBIN A1C: 6.6 % — AB (ref 4.8–5.6)

## 2016-09-30 LAB — T3: T3 TOTAL: 129 ng/dL (ref 71–180)

## 2016-09-30 LAB — TSH: TSH: 0.436 u[IU]/mL — AB (ref 0.450–4.500)

## 2016-09-30 LAB — VITAMIN D 25 HYDROXY (VIT D DEFICIENCY, FRACTURES): Vit D, 25-Hydroxy: 53.2 ng/mL (ref 30.0–100.0)

## 2016-09-30 LAB — T4, FREE: FREE T4: 1.17 ng/dL (ref 0.82–1.77)

## 2016-10-06 DIAGNOSIS — J069 Acute upper respiratory infection, unspecified: Secondary | ICD-10-CM | POA: Diagnosis not present

## 2016-10-11 ENCOUNTER — Ambulatory Visit (INDEPENDENT_AMBULATORY_CARE_PROVIDER_SITE_OTHER): Payer: Self-pay | Admitting: Nurse Practitioner

## 2016-10-11 VITALS — BP 128/88 | HR 92 | Temp 98.1°F | Resp 16

## 2016-10-11 DIAGNOSIS — J0100 Acute maxillary sinusitis, unspecified: Secondary | ICD-10-CM

## 2016-10-11 MED ORDER — FLUTICASONE PROPIONATE 50 MCG/ACT NA SUSP: 2.0000 | Freq: Every day | NASAL | 0 refills | Status: DC

## 2016-10-11 MED ORDER — BENZONATATE 100 MG PO CAPS: 100.0000 mg | ORAL_CAPSULE | Freq: Three times a day (TID) | ORAL | 0 refills | Status: AC | PRN

## 2016-10-11 MED ORDER — AMOXICILLIN-POT CLAVULANATE 875-125 MG PO TABS: 1.0000 | ORAL_TABLET | Freq: Two times a day (BID) | ORAL | 0 refills | Status: DC

## 2016-10-11 NOTE — Progress Notes (Signed)
Subjective:     Veronica Russell is a 56 y.o. female who presents for evaluation of sinus pain. Symptoms include: cough, facial pain, mouth breathing, nasal congestion, post nasal drip, sinus pressure, sneezing, tooth pain and patient unable to tell if she has had a fever.. Onset of symptoms was 5 days ago. Symptoms have been gradually worsening since that time. Past history is significant for asthma and has been told she has a history of asthma and bronchitis.. Patient is a non-smoker.  Patient does have a history of HTN, DM and hyperlipidemia.  Patient was seen a week ago for these symptoms and was told to use the nasal spray without any relief.  Patient has also use OTC sinus medication without relief.  The following portions of the patient's history were reviewed and updated as appropriate: allergies, current medications and past medical history.  Review of Systems Constitutional: positive for fatigue and night sweats, negative for anorexia and weight loss Eyes: negative Ears, nose, mouth, throat, and face: positive for earaches, nasal congestion and left sided facial pain, negative for ear drainage, hearing loss and hoarseness Respiratory: positive for asthma, cough and wheezing Neurological: positive for headaches Allergic/Immunologic: positive for hay fever   Objective:    BP 128/88 (BP Location: Right Arm, Patient Position: Sitting, Cuff Size: Normal)   Pulse 92   Temp 98.1 F (36.7 C) (Oral)   Resp 16   LMP 08/31/2008   SpO2 98%  General appearance: alert, cooperative and fatigued Head: Normocephalic, without obvious abnormality, atraumatic Eyes: conjunctivae/corneas clear. PERRL, EOM's intact. Fundi benign. Ears: abnormal TM right ear - moderate middle ear effusion Nose: turbinates red, swollen Throat: lips, mucosa, and tongue normal; teeth and gums normal Lungs: clear to auscultation bilaterally Lymph nodes: cervical lymphadenopathy bilaterally. Neurologic: Grossly normal    Assessment:    Acute bacterial sinusitis.    Plan:    Nasal steroids per medication orders. Augmentin per medication orders.  Tessalon Perles for cough.  Rest and plenty of fluids.  Patient provided work note.  Discussed with patient indications to go to ER.  Patient verbalized understanding.

## 2016-10-11 NOTE — Patient Instructions (Addendum)

## 2016-10-12 MED FILL — BENZONATATE 100 MG CAP: 100 | 10 days supply | Qty: 30 | Fill #0

## 2016-10-13 ENCOUNTER — Telehealth: Payer: Self-pay

## 2016-10-20 ENCOUNTER — Ambulatory Visit (INDEPENDENT_AMBULATORY_CARE_PROVIDER_SITE_OTHER): Payer: 59 | Admitting: Physician Assistant

## 2016-10-20 VITALS — HR 91 | Temp 97.6°F | Ht 64.0 in | Wt 247.0 lb

## 2016-10-20 DIAGNOSIS — E669 Obesity, unspecified: Secondary | ICD-10-CM

## 2016-10-20 DIAGNOSIS — R7989 Other specified abnormal findings of blood chemistry: Secondary | ICD-10-CM

## 2016-10-20 DIAGNOSIS — Z6841 Body Mass Index (BMI) 40.0 and over, adult: Secondary | ICD-10-CM

## 2016-10-20 DIAGNOSIS — R946 Abnormal results of thyroid function studies: Secondary | ICD-10-CM

## 2016-10-20 DIAGNOSIS — E119 Type 2 diabetes mellitus without complications: Secondary | ICD-10-CM | POA: Diagnosis not present

## 2016-10-20 DIAGNOSIS — IMO0001 Reserved for inherently not codable concepts without codable children: Secondary | ICD-10-CM

## 2016-10-20 MED ORDER — LIRAGLUTIDE 18 MG/3ML ~~LOC~~ SOPN: 1.2000 mg | PEN_INJECTOR | Freq: Every day | SUBCUTANEOUS | 0 refills | Status: DC

## 2016-10-24 MED FILL — VICTOZA 2-PAK 18 MG/3 ML PE: 18 | 30 days supply | Qty: 6 | Fill #0
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
## 2016-10-25 MED FILL — ATORVASTATIN 20 MG TABLET: 20 | 90 days supply | Qty: 90 | Fill #0

## 2016-10-25 MED FILL — LISINOPRIL-HCTZ 20-12.5 MG: 20-12.5 | 90 days supply | Qty: 180 | Fill #0

## 2016-10-25 MED FILL — AMLODIPINE BESYLATE 5 MG TA: 5 | 90 days supply | Qty: 90 | Fill #0

## 2016-10-25 MED FILL — METFORMIN HCL ER 500 MG TAB: 500 | 90 days supply | Qty: 180 | Fill #0

## 2016-10-25 MED FILL — PROPRANOLOL 20 MG TABLET: 20 | 90 days supply | Qty: 180 | Fill #0

## 2016-11-07 ENCOUNTER — Ambulatory Visit (INDEPENDENT_AMBULATORY_CARE_PROVIDER_SITE_OTHER): Payer: 59 | Admitting: Family Medicine

## 2016-11-07 VITALS — BP 101/68 | HR 97 | Temp 98.1°F | Ht 64.0 in | Wt 251.0 lb

## 2016-11-07 DIAGNOSIS — Z0000 Encounter for general adult medical examination without abnormal findings: Secondary | ICD-10-CM | POA: Diagnosis not present

## 2016-11-07 DIAGNOSIS — Z6841 Body Mass Index (BMI) 40.0 and over, adult: Secondary | ICD-10-CM

## 2016-11-07 DIAGNOSIS — E119 Type 2 diabetes mellitus without complications: Secondary | ICD-10-CM

## 2016-11-07 DIAGNOSIS — R7989 Other specified abnormal findings of blood chemistry: Secondary | ICD-10-CM

## 2016-11-07 DIAGNOSIS — I10 Essential (primary) hypertension: Secondary | ICD-10-CM | POA: Diagnosis not present

## 2016-11-07 DIAGNOSIS — E7800 Pure hypercholesterolemia, unspecified: Secondary | ICD-10-CM | POA: Diagnosis not present

## 2016-11-08 NOTE — Progress Notes (Signed)
Office: [phone]  /  Fax: [phone]   HPI:   Chief Complaint: OBESITY Veronica Russell is here to discuss her progress with her obesity treatment plan. She is on the Category 2 plan and is following her eating plan approximately 75 % of the time. She states she is walking for 30 minutes 2 times per week. Colbie increased eating out due to comforting friends and other social obligations. Her weight is 251 lb (113.9 kg) today and has had a weight gain of 4 lbs over a period of 2 weeks since her last visit. She has lost 8 lbs since starting treatment with [name].  Diabetes II [name] has a diagnosis of diabetes type II. [name] is on victoza and metformin and is still struggling with polyphagia but denies any hypoglycemic episodes. Last A1c was at 6.6 She has been working on intensive lifestyle modifications including diet, exercise, and weight loss to help control her blood glucose levels.  Low TSH level [name] is on thyroid medications but TSH is still slightly low. She was referred to endocrinology by her PCP but can't get in yet and asks for another referral.   ALLERGIES: Allergies  Allergen Reactions  . Milk-Related Compounds     Lactose intollerance    MEDICATIONS: Current Outpatient Prescriptions on File Prior to Visit  Medication Sig Dispense Refill  . Alcohol Swabs (ALCOHOL WIPES) 70 % PADS 30 Packages by Does not apply route 2 (two) times daily. 100 each 0  . amLODipine (NORVASC) 5 MG tablet Take 5 mg by mouth daily.    Marland Kitchen atorvastatin (LIPITOR) 20 MG tablet Take 20 mg by mouth daily.    . calcium-vitamin D (OSCAL WITH D) 500-200 MG-UNIT tablet Take 2 tablets by mouth every morning. 60 tablet 0  . cholecalciferol (VITAMIN D) 1000 units tablet Take 1,000 Units by mouth daily.    . Clobetasol Propionate 0.05 % lotion Reported on 07/21/2015    . glucose blood (ACCU-CHEK GUIDE) test strip Use twice daily 100 each 0  . Insulin Pen Needle (BD PEN NEEDLE NANO U/F) 32G X 4 MM MISC 1 Package by  Does not apply route daily. 100 each 0  . Lancets (ACCU-CHEK SAFE-T PRO) lancets Use as instructed 100 each 0  . liraglutide 18 MG/3ML SOPN Inject 0.2 mLs (1.2 mg total) into the skin daily. 2 pen 0  . lisinopril-hydrochlorothiazide (PRINZIDE,ZESTORETIC) 20-12.5 MG per tablet Take 1 tablet by mouth 2 (two) times daily.     . Melatonin 3 MG TABS Take 1 tablet by mouth at bedtime as needed.    . metFORMIN (GLUCOPHAGE-XR) 500 MG 24 hr tablet Take 500 mg by mouth 2 (two) times daily.    . prednisoLONE (PRELONE) 15 MG/5ML SOLN Take 15 mg by mouth. Reported on 07/21/2015    . propranolol (INDERAL) 20 MG tablet Take 20 mg by mouth 2 (two) times daily.      No current facility-administered medications on file prior to visit.     PAST MEDICAL HISTORY: Past Medical History:  Diagnosis Date  . Alopecia   . Asthma   . Back pain   . Diabetes mellitus ([place])   . GERD (gastroesophageal reflux disease)   . Hyperlipidemia   . Hypertension   . Joint pain   . Lactose intolerance   . Obesity   . Raynauds syndrome   . Swelling   . Tremor   . Vitamin D deficiency     PAST SURGICAL HISTORY: Past Surgical History:  Procedure Laterality Date  .  ABDOMINAL HYSTERECTOMY    . BREAST BIOPSY    . MYOMECTOMY      SOCIAL HISTORY: Social History  Substance Use Topics  . Smoking status: Never Smoker  . Smokeless tobacco: Never Used  . Alcohol use Not on file    FAMILY HISTORY: Family History  Problem Relation Age of Onset  . Multiple myeloma Mother   . Hypertension Mother   . Hyperlipidemia Mother   . Cancer Mother   . Obesity Mother   . Hypertension Father   . Hyperlipidemia Father   . Cancer Father   . Asthma Other   . Hyperlipidemia Other   . Hypertension Other   . Cancer Other   . COPD Other   . Stroke Other   . Breast cancer Maternal Aunt   . Breast cancer Paternal Aunt   . Breast cancer Maternal Aunt     ROS: Review of Systems  Constitutional: Negative for weight loss.    Endo/Heme/Allergies:       Positive polyphagia Negative hypoglycemia    PHYSICAL EXAM: Blood pressure 101/68, pulse 97, temperature 98.1 F (36.7 C), temperature source Oral, height _0  (1.626 m), weight 251 lb (113.9 kg), last menstrual period 08/31/2008, SpO2 98 %. Body mass index is 43.08 kg/m. Physical Exam  Constitutional: She is oriented to person, place, and time. She appears well-developed and well-nourished.  Cardiovascular: Normal rate.   Pulmonary/Chest: Effort normal.  Musculoskeletal: Normal range of motion.  Neurological: She is oriented to person, place, and time.  Skin: Skin is warm and dry.  Psychiatric: She has a normal mood and affect. Her behavior is normal.  Vitals reviewed.   RECENT LABS AND TESTS: BMET    Component Value Date/Time   NA 143 09/29/2016 0937   K 4.1 09/29/2016 0937   CL 102 09/29/2016 0937   CO2 23 09/29/2016 0937   GLUCOSE 114 (H) 09/29/2016 0937   GLUCOSE 99 09/10/2009 0540   BUN 15 09/29/2016 0937   CREATININE 0.74 09/29/2016 0937   CALCIUM 10.2 09/29/2016 0937   GFRNONAA 90 09/29/2016 0937   GFRAA 104 09/29/2016 0937   Lab Results  Component Value Date   HGBA1C 6.6 (H) 09/29/2016   HGBA1C 6.5 (H) 05/09/2016   HGBA1C 6.7 (H) 01/27/2016   HGBA1C 6.9 01/12/2016   HGBA1C 6.6 07/21/2015   Lab Results  Component Value Date   INSULIN 39.0 (H) 09/29/2016   INSULIN 38.5 (H) 05/09/2016   INSULIN 27.3 (H) 01/27/2016   CBC    Component Value Date/Time   WBC 6.8 01/27/2016 1006   WBC 11.1 (H) 09/10/2009 0540   RBC 4.28 01/27/2016 1006   RBC 3.89 09/10/2009 0540   HGB 12.1 01/27/2016 1006   HCT 37.0 01/27/2016 1006   PLT 241 09/10/2009 0540   MCV 86 01/27/2016 1006   MCH 28.3 01/27/2016 1006   MCH 29.0 09/10/2009 0540   MCHC 32.7 01/27/2016 1006   MCHC 33.0 09/10/2009 0540   RDW 13.3 01/27/2016 1006   LYMPHSABS 2.0 01/27/2016 1006   EOSABS 0.2 01/27/2016 1006   BASOSABS 0.0 01/27/2016 1006   Iron/TIBC/Ferritin/  %Sat No results found for: IRON, TIBC, FERRITIN, IRONPCTSAT Lipid Panel     Component Value Date/Time   CHOL 162 09/29/2016 0937   TRIG 140 09/29/2016 0937   HDL 52 09/29/2016 0937   LDLCALC 82 09/29/2016 0937   Hepatic Function Panel     Component Value Date/Time   PROT 7.0 09/29/2016 0937   ALBUMIN 4.3 09/29/2016  4854   AST 15 09/29/2016 0937   ALT 20 09/29/2016 0937   ALKPHOS 87 09/29/2016 0937   BILITOT 0.3 09/29/2016 0937      Component Value Date/Time   TSH 0.436 (L) 09/29/2016 0937   TSH 0.443 (L) 05/09/2016 1005   TSH 0.425 (L) 01/27/2016 1006    ASSESSMENT AND PLAN: Type 2 diabetes mellitus without complication, without long-term current use of insulin (HCC)  Decreased thyroid stimulating hormone (TSH) level - Plan: Ambulatory referral to Internal Medicine  Class 3 severe obesity with serious comorbidity and body mass index (BMI) of 40.0 to 44.9 in adult, unspecified obesity type ([place])  PLAN:  Diabetes II Dinia has been given extensive diabetes education by myself today including ideal fasting and post-prandial blood glucose readings, individual ideal Hgb A1c goals  and hypoglycemia prevention. We discussed the importance of good blood sugar control to decrease the likelihood of diabetic complications such as nephropathy, neuropathy, limb loss, blindness, coronary artery disease, and death. We discussed the importance of intensive lifestyle modification including diet, exercise and weight loss as the first line treatment for diabetes. Cressida agrees to continue her diabetes medications and will follow up at the agreed upon time.  Low TSH level We will refer to Dr. Cruzita Lederer (endocrinology) and Dejane will follow up with our clinic in 2 weeks.   Obesity Aarion is currently in the action stage of change. As such, her goal is to continue with weight loss efforts She has agreed to follow the Category 2 plan Samanthajo has been instructed to work up to a goal of 150 minutes of  combined cardio and strengthening exercise per week for weight loss and overall health benefits. We discussed the following Behavioral Modification Strategies today: increasing lean protein intake and decreasing simple carbohydrates   Maeva has agreed to follow up with our clinic in 2 weeks. She was informed of the importance of frequent follow up visits to maximize her success with intensive lifestyle modifications for her multiple health conditions.  I, Doreene Nest, am acting as transcriptionist for Dennard Nip, MD  I have reviewed the above documentation for accuracy and completeness, and I agree with the above. -Dennard Nip, MD    OBESITY BEHAVIORAL INTERVENTION VISIT  Today's visit was # 18 out of 22.  Starting weight: 259 lbs Starting date: 01/26/17 Today's weight : 251 lbs Today's date: 11/07/2016 Total lbs lost to date: 8 (Patients must lose 7 lbs in the first 6 months to continue with counseling)   ASK: We discussed the diagnosis of obesity with [name] today and [name] agreed to give [name] permission to discuss obesity behavioral modification therapy today.  ASSESS: Yoshika has the diagnosis of obesity and her BMI today is 43.06 Shawnda is in the action stage of change   ADVISE: Layliana was educated on the multiple health risks of obesity as well as the benefit of weight loss to improve her health. She was advised of the need for long term treatment and the importance of lifestyle modifications.  AGREE: Multiple dietary modification options and treatment options were discussed and  Anapaula agreed to follow the Category 2 plan We discussed the following Behavioral Modification Strategies today: increasing lean protein intake and decreasing simple carbohydrates

## 2016-11-17 ENCOUNTER — Ambulatory Visit (INDEPENDENT_AMBULATORY_CARE_PROVIDER_SITE_OTHER): Payer: 59 | Admitting: Physician Assistant

## 2016-11-17 VITALS — BP 102/68 | HR 84 | Temp 98.2°F | Ht 64.0 in | Wt 251.0 lb

## 2016-11-17 DIAGNOSIS — E119 Type 2 diabetes mellitus without complications: Secondary | ICD-10-CM | POA: Diagnosis not present

## 2016-11-17 DIAGNOSIS — Z6841 Body Mass Index (BMI) 40.0 and over, adult: Secondary | ICD-10-CM
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
## 2016-11-29 ENCOUNTER — Other Ambulatory Visit (INDEPENDENT_AMBULATORY_CARE_PROVIDER_SITE_OTHER): Payer: Self-pay | Admitting: Physician Assistant

## 2016-11-29 DIAGNOSIS — E119 Type 2 diabetes mellitus without complications: Secondary | ICD-10-CM

## 2016-11-29 MED ORDER — LIRAGLUTIDE 18 MG/3ML ~~LOC~~ SOPN: 1.2000 mg | PEN_INJECTOR | Freq: Every day | SUBCUTANEOUS | 0 refills | Status: DC

## 2016-11-30 ENCOUNTER — Ambulatory Visit (INDEPENDENT_AMBULATORY_CARE_PROVIDER_SITE_OTHER): Payer: 59 | Admitting: "Endocrinology

## 2016-11-30 ENCOUNTER — Encounter: Payer: Self-pay | Admitting: "Endocrinology

## 2016-11-30 VITALS — BP 139/82 | HR 93 | Ht 64.0 in | Wt 260.0 lb

## 2016-11-30 DIAGNOSIS — E049 Nontoxic goiter, unspecified: Secondary | ICD-10-CM | POA: Diagnosis not present

## 2016-11-30 DIAGNOSIS — E0590 Thyrotoxicosis, unspecified without thyrotoxic crisis or storm: Secondary | ICD-10-CM | POA: Insufficient documentation

## 2016-11-30 MED FILL — VICTOZA 2-PAK 18 MG/3 ML PE: 18 | 30 days supply | Qty: 6 | Fill #0

## 2016-11-30 NOTE — Progress Notes (Signed)
Consult Note                                            11/30/2016, 5:32 PM   Subjective:    Patient ID: Veronica Russell, female    DOB: 03-Oct-1960, PCP Rankins, Bill Salinas, MD   Past Medical History:  Diagnosis Date  . Alopecia   . Asthma   . Back pain   . Diabetes mellitus ([place])   . GERD (gastroesophageal reflux disease)   . Hyperlipidemia   . Hypertension   . Joint pain   . Lactose intolerance   . Obesity   . Raynauds syndrome   . Swelling   . Tremor   . Vitamin D deficiency    Past Surgical History:  Procedure Laterality Date  . ABDOMINAL HYSTERECTOMY    . BREAST BIOPSY    . MYOMECTOMY     Social History   Social History  . Marital status: Single    Spouse name: N/A  . Number of children: N/A  . Years of education: N/A   Occupational History  . [place] History Main Topics  . Smoking status: Never Smoker  . Smokeless tobacco: Never Used  . Alcohol use No  . Drug use: No  . Sexual activity: Not Asked   Other Topics Concern  . None   Social History Narrative  . None   Outpatient Encounter Prescriptions as of 11/30/2016  Medication Sig  . amLODipine (NORVASC) 5 MG tablet Take 5 mg by mouth daily.  Marland Kitchen atorvastatin (LIPITOR) 20 MG tablet Take 20 mg by mouth daily.  Marland Kitchen liraglutide 18 MG/3ML SOPN Inject 0.2 mLs (1.2 mg total) into the skin daily.  Marland Kitchen lisinopril-hydrochlorothiazide (PRINZIDE,ZESTORETIC) 20-12.5 MG per tablet Take 1 tablet by mouth 2 (two) times daily.   . metFORMIN (GLUCOPHAGE-XR) 500 MG 24 hr tablet Take 1,000 mg by mouth at bedtime.   . propranolol (INDERAL) 20 MG tablet Take 20 mg by mouth 2 (two) times daily.   . Alcohol Swabs (ALCOHOL WIPES) 70 % PADS 30 Packages by Does not apply route 2 (two) times daily.  . calcium-vitamin D (OSCAL WITH D) 500-200 MG-UNIT tablet Take 2 tablets by mouth every morning.  . Clobetasol Propionate 0.05 % lotion Reported on 07/21/2015  . glucose blood (ACCU-CHEK  GUIDE) test strip Use twice daily  . Insulin Pen Needle (BD PEN NEEDLE NANO U/F) 32G X 4 MM MISC 1 Package by Does not apply route daily.  . Lancets (ACCU-CHEK SAFE-T PRO) lancets Use as instructed  . Melatonin 3 MG TABS Take 1 tablet by mouth at bedtime as needed.  . [DISCONTINUED] cholecalciferol (VITAMIN D) 1000 units tablet Take 1,000 Units by mouth daily.  . [DISCONTINUED] prednisoLONE (PRELONE) 15 MG/5ML SOLN Take 15 mg by mouth. Reported on 07/21/2015   No facility-administered encounter medications on file as of 11/30/2016.    ALLERGIES: Allergies  Allergen Reactions  . Milk-Related Compounds     Lactose intollerance    VACCINATION STATUS:  There is no immunization history on file for this patient.  HPI Veronica Russell is 56 y.o. female who presents today with a medical history as above. she is being seen in consultation for subclinical hyperthyroidism requested by Aretta Nip, MD. - She was found to have suppressed TSH for at least  one year on 3 separate lab measurements. She never required anti-thyroid therapy. She is not taking thyroid hormone replacements on thyroid supplements. She denies any family history of thyroid dysfunction. She denies any personal or family history of thyroid cancer. - She denies dysphagia, shortness of breath, nor voice change. She denies heat/cold intolerance. She denies palpitations, nor anxiety. 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   She has well controlled type 2 diabetes with A1c of 6.6% currently on Victoza 1.2 mg daily and metformin 1000 mg by mouth daily. -   Review of Systems  Constitutional: + Aggressive weight gain, no fatigue, no subjective hyperthermia, no subjective hypothermia Eyes: no blurry vision, no xerophthalmia ENT: no sore throat, no nodules palpated in throat, no dysphagia/odynophagia, no hoarseness Cardiovascular: no Chest Pain, no Shortness of Breath, no palpitations, no leg  swelling Respiratory: no cough, no SOB Gastrointestinal: no Nausea/Vomiting/Diarhhea Musculoskeletal: no muscle/joint aches Skin: no rashes Neurological: + tremors, no numbness, no tingling, no dizziness Psychiatric: no depression, no anxiety  Objective:    BP 139/82   Pulse 93   Ht 5\' 4"  (1.626 m)   Wt 260 lb (117.9 kg)   LMP 08/31/2008   BMI 44.63 kg/m   Wt Readings from Last 3 Encounters:  11/30/16 260 lb (117.9 kg)  11/17/16 251 lb (113.9 kg)  11/07/16 251 lb (113.9 kg)    Physical Exam  Constitutional: + obese for height, not in acute distress, normal state of mind Eyes: PERRLA, EOMI, no exophthalmos ENT: moist mucous membranes, + thyromegaly, no cervical lymphadenopathy Cardiovascular: normal precordial activity, Regular Rate and Rhythm, no Murmur/Rubs/Gallops Respiratory:  adequate breathing efforts, no gross chest deformity, Clear to auscultation bilaterally Gastrointestinal: abdomen soft, Non -tender, No distension, Bowel Sounds present Musculoskeletal: no gross deformities, strength intact in all four extremities Skin: moist, warm, no rashes Neurological: + tremor with outstretched hands (R>L), Deep tendon reflexes normal in all four extremities.  CMP ( most recent) CMP     Component Value Date/Time   NA 143 09/29/2016 0937   K 4.1 09/29/2016 0937   CL 102 09/29/2016 0937   CO2 23 09/29/2016 0937   GLUCOSE 114 (H) 09/29/2016 0937   GLUCOSE 99 09/10/2009 0540   BUN 15 09/29/2016 0937   CREATININE 0.74 09/29/2016 0937   CALCIUM 10.2 09/29/2016 0937   PROT 7.0 09/29/2016 0937   ALBUMIN 4.3 09/29/2016 0937   AST 15 09/29/2016 0937   ALT 20 09/29/2016 0937   ALKPHOS 87 09/29/2016 0937   BILITOT 0.3 09/29/2016 0937   GFRNONAA 90 09/29/2016 0937   GFRAA 104 09/29/2016 0937     Diabetic Labs (most recent): Lab Results  Component Value Date   HGBA1C 6.6 (H) 09/29/2016   HGBA1C 6.5 (H) 05/09/2016   HGBA1C 6.7 (H) 01/27/2016     Lipid Panel ( most  recent) Lipid Panel     Component Value Date/Time   CHOL 162 09/29/2016 0937   TRIG 140 09/29/2016 0937   HDL 52 09/29/2016 0937   LDLCALC 82 09/29/2016 0937      Lab Results  Component Value Date   TSH 0.436 (L) 09/29/2016   TSH 0.443 (L) 05/09/2016   TSH 0.425 (L) 01/27/2016   FREET4 1.17 09/29/2016   FREET4 1.24 05/09/2016   FREET4 1.17 01/27/2016           Assessment & Plan:   1. Subclinical hyperthyroidism 2. Goiter 3. Type 2 diabetes.  - Veronica Russell  is being seen  at a kind request of Rankins, Bill Salinas, MD. - I have reviewed her available thyroid  records and clinically evaluated the patient. - Based on my reviews, she has   stable subclinical hyperthyroidism associated with mild goiter.  - She will not require antithyroid treatment at this time.  -She will need a repeat,  more complete Thyroid function tests before her next visit in 4 months. - She will also need baseline thyroid/neck ultrasound.   - She has well controlled type 2 diabetes with A1c of 6.6%. I have advised her to continue Victoza 1.2 mg daily and metformin 1000 mg by mouth daily.  -  Suggestion is made for her to avoid simple carbohydrates  from her diet including Cakes, Sweet Desserts / Pastries, Ice Cream, Soda (diet and regular), Sweet Tea, Candies, Chips, Cookies, Store Bought Juices, Alcohol in Excess of  1-2 drinks a day, Artificial Sweeteners, and "Sugar-free" Products. This will help patient to have stable blood glucose profile and potentially avoid unintended weight gain.  - I did not initiate any new prescriptions today.  - I advised patient to maintain close follow up with Rankins, Bill Salinas, MD for primary care needs. Follow up plan: Return in about 4 months (around 03/30/2017) for follow up with pre-visit labs, Thyroid / Neck Ultrasound.   Glade Lloyd, MD [facility] [address] [place], [place] [address] Phone:  [phone]  Fax: [phone]     11/30/2016, 5:32 PM  This note was partially dictated with voice recognition software. Similar sounding words can be transcribed inadequately or may not  be corrected upon review.

## 2016-12-05 DIAGNOSIS — H6691 Otitis media, unspecified, right ear: Secondary | ICD-10-CM | POA: Diagnosis not present

## 2016-12-05 DIAGNOSIS — J069 Acute upper respiratory infection, unspecified: Secondary | ICD-10-CM | POA: Diagnosis not present

## 2016-12-05 MED FILL — VENTOLIN HFA 90 MCG INHALER: 108 (90 BAS | 30 days supply | Qty: 18 | Fill #0

## 2016-12-05 MED FILL — AMOXICILLIN 875 MG TABLET: 875 | 10 days supply | Qty: 20 | Fill #0

## 2016-12-07 ENCOUNTER — Ambulatory Visit (INDEPENDENT_AMBULATORY_CARE_PROVIDER_SITE_OTHER): Payer: 59 | Admitting: Physician Assistant

## 2016-12-07 VITALS — BP 121/84 | HR 101 | Temp 98.1°F | Ht 64.0 in | Wt 254.0 lb

## 2016-12-07 DIAGNOSIS — Z9189 Other specified personal risk factors, not elsewhere classified: Secondary | ICD-10-CM | POA: Diagnosis not present

## 2016-12-07 DIAGNOSIS — I10 Essential (primary) hypertension: Secondary | ICD-10-CM

## 2016-12-07 DIAGNOSIS — E119 Type 2 diabetes mellitus without complications: Secondary | ICD-10-CM

## 2016-12-07 DIAGNOSIS — Z6841 Body Mass Index (BMI) 40.0 and over, adult: Secondary | ICD-10-CM | POA: Diagnosis not present

## 2016-12-07 MED ORDER — LIRAGLUTIDE 18 MG/3ML ~~LOC~~ SOPN: 1.2000 mg | PEN_INJECTOR | Freq: Every day | SUBCUTANEOUS | 0 refills | Status: DC

## 2016-12-08 NOTE — Progress Notes (Signed)
Office: [phone]  /  Fax: [phone]   HPI:   Chief Complaint: OBESITY Veronica Russell is here to discuss her progress with her obesity treatment plan. She is on the Category 2 plan and is following her eating plan approximately 70 % of the time. She states she is walking for 30 minutes 3 times per week. Veronica Russell had increased celebration eating and states she is eating out more. Veronica Russell would like more variety with her meals. Her weight is 254 lb (115.2 kg) today and has had a weight gain of 3 pounds over a period of 3 weeks since her last visit. She has lost 5 lbs since starting treatment with [name].  Diabetes II Veronica Russell has a diagnosis of diabetes type II. Veronica Russell states fasting BGs range between 80 and 120's and denies any hypoglycemic episodes. She has been working on intensive lifestyle modifications including diet, exercise, and weight loss to help control her blood glucose levels.  Hypertension Veronica Russell is a 56 y.o. female with hypertension. Veronica Russell denies chest pain or shortness of breath on exertion. She is working weight loss to help control her blood pressure with the goal of decreasing her risk of heart attack and stroke. Veronica Russell blood pressure is currently stable.  At risk for cardiovascular disease Veronica Russell is at a higher than average risk for cardiovascular disease due to obesity, hypertension and diabetes. She currently denies any chest pain.  ALLERGIES: Allergies  Allergen Reactions  . Milk-Related Compounds     Lactose intollerance    MEDICATIONS: Current Outpatient Medications on File Prior to Visit  Medication Sig Dispense Refill  . Alcohol Swabs (ALCOHOL WIPES) 70 % PADS 30 Packages by Does not apply route 2 (two) times daily. 100 each 0  . amLODipine (NORVASC) 5 MG tablet Take 5 mg by mouth daily.    Marland Kitchen atorvastatin (LIPITOR) 20 MG tablet Take 20 mg by mouth daily.    . calcium-vitamin D (OSCAL WITH D) 500-200 MG-UNIT tablet Take 2 tablets by mouth every  morning. 60 tablet 0  . Clobetasol Propionate 0.05 % lotion Reported on 07/21/2015    . glucose blood (ACCU-CHEK GUIDE) test strip Use twice daily 100 each 0  . Insulin Pen Needle (BD PEN NEEDLE NANO U/F) 32G X 4 MM MISC 1 Package by Does not apply route daily. 100 each 0  . Lancets (ACCU-CHEK SAFE-T PRO) lancets Use as instructed 100 each 0  . lisinopril-hydrochlorothiazide (PRINZIDE,ZESTORETIC) 20-12.5 MG per tablet Take 1 tablet by mouth 2 (two) times daily.     . Melatonin 3 MG TABS Take 1 tablet by mouth at bedtime as needed.    . metFORMIN (GLUCOPHAGE-XR) 500 MG 24 hr tablet Take 1,000 mg by mouth at bedtime.     . propranolol (INDERAL) 20 MG tablet Take 20 mg by mouth 2 (two) times daily.      No current facility-administered medications on file prior to visit.     PAST MEDICAL HISTORY: Past Medical History:  Diagnosis Date  . Alopecia   . Asthma   . Back pain   . Diabetes mellitus ([place])   . GERD (gastroesophageal reflux disease)   . Hyperlipidemia   . Hypertension   . Joint pain   . Lactose intolerance   . Obesity   . Raynauds syndrome   . Swelling   . Tremor   . Vitamin D deficiency     PAST SURGICAL HISTORY: Past Surgical History:  Procedure Laterality Date  . ABDOMINAL HYSTERECTOMY    .  BREAST BIOPSY    . MYOMECTOMY      SOCIAL HISTORY: Social History   Tobacco Use  . Smoking status: Never Smoker  . Smokeless tobacco: Never Used  Substance Use Topics  . Alcohol use: No  . Drug use: No    FAMILY HISTORY: Family History  Problem Relation Age of Onset  . Multiple myeloma Mother   . Hypertension Mother   . Hyperlipidemia Mother   . Cancer Mother   . Obesity Mother   . Hypertension Father   . Hyperlipidemia Father   . Cancer Father   . Asthma Other   . Hyperlipidemia Other   . Hypertension Other   . Cancer Other   . COPD Other   . Stroke Other   . Breast cancer Maternal Aunt   . Breast cancer Paternal Aunt   . Breast cancer Maternal Aunt      ROS: Review of Systems  Constitutional: Negative for weight loss.  Respiratory: Negative for shortness of breath (on exertion).   Cardiovascular: Negative for chest pain.  Endo/Heme/Allergies:       Negative hypoglycemia    PHYSICAL EXAM: Blood pressure 121/84, pulse (!) 101, temperature 98.1 F (36.7 C), temperature source Oral, height '5\' 4"'$  (1.626 m), weight 254 lb (115.2 kg), last menstrual period 08/31/2008, SpO2 98 %. Body mass index is 43.6 kg/m. Physical Exam  Constitutional: She is oriented to person, place, and time. She appears well-developed and well-nourished.  Cardiovascular:  tachycardic  Pulmonary/Chest: Effort normal.  Musculoskeletal: Normal range of motion.  Neurological: She is oriented to person, place, and time.  Skin: Skin is warm and dry.  Psychiatric: She has a normal mood and affect.  Vitals reviewed.   RECENT LABS AND TESTS: BMET    Component Value Date/Time   NA 143 09/29/2016 0937   K 4.1 09/29/2016 0937   CL 102 09/29/2016 0937   CO2 23 09/29/2016 0937   GLUCOSE 114 (H) 09/29/2016 0937   GLUCOSE 99 09/10/2009 0540   BUN 15 09/29/2016 0937   CREATININE 0.74 09/29/2016 0937   CALCIUM 10.2 09/29/2016 0937   GFRNONAA 90 09/29/2016 0937   GFRAA 104 09/29/2016 0937   Lab Results  Component Value Date   HGBA1C 6.6 (H) 09/29/2016   HGBA1C 6.5 (H) 05/09/2016   HGBA1C 6.7 (H) 01/27/2016   HGBA1C 6.9 01/12/2016   HGBA1C 6.6 07/21/2015   Lab Results  Component Value Date   INSULIN 39.0 (H) 09/29/2016   INSULIN 38.5 (H) 05/09/2016   INSULIN 27.3 (H) 01/27/2016   CBC    Component Value Date/Time   WBC 6.8 01/27/2016 1006   WBC 11.1 (H) 09/10/2009 0540   RBC 4.28 01/27/2016 1006   RBC 3.89 09/10/2009 0540   HGB 12.1 01/27/2016 1006   HCT 37.0 01/27/2016 1006   PLT 241 09/10/2009 0540   MCV 86 01/27/2016 1006   MCH 28.3 01/27/2016 1006   MCH 29.0 09/10/2009 0540   MCHC 32.7 01/27/2016 1006   MCHC 33.0 09/10/2009 0540   RDW 13.3  01/27/2016 1006   LYMPHSABS 2.0 01/27/2016 1006   EOSABS 0.2 01/27/2016 1006   BASOSABS 0.0 01/27/2016 1006   Iron/TIBC/Ferritin/ %Sat No results found for: IRON, TIBC, FERRITIN, IRONPCTSAT Lipid Panel     Component Value Date/Time   CHOL 162 09/29/2016 0937   TRIG 140 09/29/2016 0937   HDL 52 09/29/2016 0937   LDLCALC 82 09/29/2016 0937   Hepatic Function Panel     Component Value Date/Time  PROT 7.0 09/29/2016 0937   ALBUMIN 4.3 09/29/2016 0937   AST 15 09/29/2016 0937   ALT 20 09/29/2016 0937   ALKPHOS 87 09/29/2016 0937   BILITOT 0.3 09/29/2016 0937      Component Value Date/Time   TSH 0.436 (L) 09/29/2016 0937   TSH 0.443 (L) 05/09/2016 1005   TSH 0.425 (L) 01/27/2016 1006    ASSESSMENT AND PLAN: Type 2 diabetes mellitus without complication, without long-term current use of insulin ([place]) - Plan: liraglutide 18 MG/3ML SOPN  Essential hypertension  At risk for heart disease  Class 3 severe obesity with serious comorbidity and body mass index (BMI) of 40.0 to 44.9 in adult, unspecified obesity type (Veronica Russell)  PLAN:  Diabetes II Veronica Russell has been given extensive diabetes education by myself today including ideal fasting and post-prandial blood glucose readings, individual ideal Hgb A1c goals  and hypoglycemia prevention. We discussed the importance of good blood sugar control to decrease the likelihood of diabetic complications such as nephropathy, neuropathy, limb loss, blindness, coronary artery disease, and death. We discussed the importance of intensive lifestyle modification including diet, exercise and weight loss as the first line treatment for diabetes. Veronica Russell agrees to continue her diabetes medications, we will refill Victoza 5 pens (patient at 1.5 mg) and will follow up at the agreed upon time.  Hypertension We discussed sodium restriction, working on healthy weight loss, and a regular exercise program as the means to achieve improved blood pressure control. Veronica Russell  agreed with this plan and agreed to follow up as directed. We will continue to monitor her blood pressure as well as her progress with the above lifestyle modifications. She will continue her medications as prescribed and will watch for signs of hypotension as she continues her lifestyle modifications.  Cardiovascular risk counseling Veronica Russell was given extended (15 minutes) coronary artery disease prevention counseling today. She is 56 y.o. female and has risk factors for heart disease including obesity, hypertension and diabetes. We discussed intensive lifestyle modifications today with an emphasis on specific weight loss instructions and strategies. Pt was also informed of the importance of increasing exercise and decreasing saturated fats to help prevent heart disease.  Obesity Veronica Russell is currently in the action stage of change. As such, her goal is to continue with weight loss efforts She has agreed to keep a food journal with 1200 calories and 85 grams of protein daily Veronica Russell has been instructed to work up to a goal of 150 minutes of combined cardio and strengthening exercise per week for weight loss and overall health benefits. We discussed the following Behavioral Modification Strategies today: increasing lean protein intake and keep a strict food journal  Veronica Russell has agreed to follow up with our clinic in 3 weeks. She was informed of the importance of frequent follow up visits to maximize her success with intensive lifestyle modifications for her multiple health conditions.  I, Doreene Nest, am acting as transcriptionist for Lacy Duverney, PA-C  I have reviewed the above documentation for accuracy and completeness, and I agree with the above. -Lacy Duverney, PA-C  I have reviewed the above note and agree with the plan. -Veronica Nip, MD   OBESITY BEHAVIORAL INTERVENTION VISIT  Today's visit was # 20 out of 22.  Starting weight: 259 lbs Starting date: 01/27/16 Today's weight : 254  lbs Today's date: 12/07/2016 Total lbs lost to date: 5 (Patients must lose 7 lbs in the first 6 months to continue with counseling)   ASK: We discussed the diagnosis of  obesity with Veronica Russell today and Charlita agreed to give [name] permission to discuss obesity behavioral modification therapy today.  ASSESS: Jordynne has the diagnosis of obesity and her BMI today is 43.58 Kadiatou is in the action stage of change   ADVISE: Veronica Russell was educated on the multiple health risks of obesity as well as the benefit of weight loss to improve her health. She was Veronica Russell of the need for long term treatment and the importance of lifestyle modifications.  AGREE: Multiple dietary modification options and treatment options were discussed and  Cyndal agreed to keep a food journal with 1200 calories and 85 grams of protein daily We discussed the following Behavioral Modification Strategies today: increasing lean protein intake and keep a strict food journal

## 2016-12-12 DIAGNOSIS — J45909 Unspecified asthma, uncomplicated: Secondary | ICD-10-CM | POA: Diagnosis not present

## 2016-12-13 MED FILL — predniSONE 20 MG TABS: 20 | 5 days supply | Qty: 10 | Fill #0

## 2016-12-20 ENCOUNTER — Ambulatory Visit (INDEPENDENT_AMBULATORY_CARE_PROVIDER_SITE_OTHER): Payer: 59 | Admitting: Family Medicine

## 2016-12-26 ENCOUNTER — Other Ambulatory Visit (INDEPENDENT_AMBULATORY_CARE_PROVIDER_SITE_OTHER): Payer: Self-pay | Admitting: Physician Assistant

## 2016-12-26 ENCOUNTER — Other Ambulatory Visit (INDEPENDENT_AMBULATORY_CARE_PROVIDER_SITE_OTHER): Payer: Self-pay | Admitting: Family Medicine

## 2016-12-26 DIAGNOSIS — E119 Type 2 diabetes mellitus without complications: Secondary | ICD-10-CM

## 2016-12-26 DIAGNOSIS — Z794 Long term (current) use of insulin: Principal | ICD-10-CM

## 2016-12-26 MED ORDER — INSULIN PEN NEEDLE 32G X 4 MM MISC: 1.0000 | Freq: Every day | 0 refills | Status: DC

## 2016-12-26 MED ORDER — LIRAGLUTIDE 18 MG/3ML ~~LOC~~ SOPN: 1.2000 mg | PEN_INJECTOR | Freq: Every day | SUBCUTANEOUS | 0 refills | Status: DC

## 2016-12-26 MED ORDER — ALCOHOL WIPES 70 % PADS: 30.0000 | MEDICATED_PAD | Freq: Two times a day (BID) | 0 refills | Status: DC

## 2016-12-26 MED FILL — SM ALCOHOL 70% PREP PADS: 70 | 50 days supply | Qty: 100 | Fill #0

## 2016-12-26 MED FILL — VICTOZA 2-PAK 18 MG/3 ML PE: 18 | 30 days supply | Qty: 6 | Fill #0

## 2016-12-26 MED FILL — UNIFINE PENTIPS 32GX5/32: 32G X 4 MM | 90 days supply | Qty: 100 | Fill #0

## 2016-12-26 MED FILL — UNIFINE PENTIPS 32GX5/32": 32G X 4 MM | 90 days supply | Qty: 100 | Fill #0

## 2017-01-05 ENCOUNTER — Other Ambulatory Visit: Payer: Self-pay | Admitting: *Deleted

## 2017-01-05 NOTE — Patient Outreach (Signed)
Veronica Russell transitioned from the [employer] [facility] program to the [employer] on 01/18/16 for Type II diabetes self-management assistance so will close case to the diabetes [facility] program due to delegation of disease management services to [employer] from [employer] for [place] members in 2019. Barrington Ellison RN,CCM,CDE [place] Management Coordinator [facility] and [employer] [phone] Office Fax [phone]

## 2017-01-09 ENCOUNTER — Ambulatory Visit (INDEPENDENT_AMBULATORY_CARE_PROVIDER_SITE_OTHER): Payer: 59 | Admitting: Physician Assistant

## 2017-01-12 ENCOUNTER — Ambulatory Visit (INDEPENDENT_AMBULATORY_CARE_PROVIDER_SITE_OTHER): Payer: 59 | Admitting: Physician Assistant

## 2017-01-12 VITALS — BP 120/84 | HR 94 | Temp 98.2°F | Ht 64.0 in | Wt 253.0 lb

## 2017-01-12 DIAGNOSIS — E119 Type 2 diabetes mellitus without complications: Secondary | ICD-10-CM | POA: Diagnosis not present

## 2017-01-12 DIAGNOSIS — I10 Essential (primary) hypertension: Secondary | ICD-10-CM | POA: Diagnosis not present

## 2017-01-12 DIAGNOSIS — Z6841 Body Mass Index (BMI) 40.0 and over, adult: Secondary | ICD-10-CM | POA: Diagnosis not present

## 2017-01-12 DIAGNOSIS — Z9189 Other specified personal risk factors, not elsewhere classified: Secondary | ICD-10-CM

## 2017-01-12 MED ORDER — LIRAGLUTIDE 18 MG/3ML ~~LOC~~ SOPN: 1.5000 mg | PEN_INJECTOR | Freq: Every day | SUBCUTANEOUS | 0 refills | Status: DC

## 2017-01-14 DIAGNOSIS — W57XXXA Bitten or stung by nonvenomous insect and other nonvenomous arthropods, initial encounter: Secondary | ICD-10-CM | POA: Diagnosis not present

## 2017-01-14 DIAGNOSIS — S90464A Insect bite (nonvenomous), right lesser toe(s), initial encounter: Secondary | ICD-10-CM | POA: Diagnosis not present

## 2017-01-14 DIAGNOSIS — L03031 Cellulitis of right toe: Secondary | ICD-10-CM | POA: Diagnosis not present

## 2017-01-16 MED FILL — CEPHALEXIN 500 MG CAPSULE: 500 | 7 days supply | Qty: 21 | Fill #0

## 2017-01-16 NOTE — Progress Notes (Signed)
Office: [phone]  /  Fax: [phone]   HPI:   Chief Complaint: OBESITY Veronica Russell is here to discuss her progress with her obesity treatment plan. She is on the keep a food journal with 1200 calories and 85 grams of protein daily and is following her eating plan approximately 70 % of the time. She states she is exercising 0 minutes 0 times per week. Veronica Russell continues to do welll with weight loss. She is mindful of her eating and controls her portions. She would like more variety with her meals.  Her weight is 253 lb (114.8 kg) today and has had a weight loss of 1 pound over a period of 5 weeks since her last visit. She has lost 6 lbs since starting treatment with [name].  Diabetes II  Veronica Russell has a diagnosis of diabetes type II. Veronica Russell states fasting BGs range between 80 and 140's and denies any hypoglycemic episodes. Last A1c was 6.6 on 09/29/16. She has been working on intensive lifestyle modifications including diet, exercise, and weight loss to help control her blood glucose levels.  Hypertension Veronica Russell is a 56 y.o. female with hypertension. Veronica Russell's blood pressure is stable and she denies chest pain or shortness of breath. She is working weight loss to help control her blood pressure with the goal of decreasing her risk of heart attack and stroke. Veronica Russell's blood pressure is currently controlled.  At risk for cardiovascular disease Veronica Russell is at a higher than average risk for cardiovascular disease due to obesity and hypertension. She currently denies any chest pain.  ALLERGIES: Allergies  Allergen Reactions  . Milk-Related Compounds     Lactose intollerance    MEDICATIONS: Current Outpatient Medications on File Prior to Visit  Medication Sig Dispense Refill  . Alcohol Swabs (ALCOHOL WIPES) 70 % PADS 30 Packages by Does not apply route 2 (two) times daily. 100 each 0  . amLODipine (NORVASC) 5 MG tablet Take 5 mg by mouth daily.    Marland Kitchen atorvastatin (LIPITOR) 20 MG tablet Take 20 mg  by mouth daily.    . calcium-vitamin D (OSCAL WITH D) 500-200 MG-UNIT tablet Take 2 tablets by mouth every morning. 60 tablet 0  . Clobetasol Propionate 0.05 % lotion Reported on 07/21/2015    . glucose blood (ACCU-CHEK GUIDE) test strip Use twice daily 100 each 0  . Insulin Pen Needle (BD PEN NEEDLE NANO U/F) 32G X 4 MM MISC 1 Package by Does not apply route daily. 100 each 0  . Lancets (ACCU-CHEK SAFE-T PRO) lancets Use as instructed 100 each 0  . lisinopril-hydrochlorothiazide (PRINZIDE,ZESTORETIC) 20-12.5 MG per tablet Take 1 tablet by mouth 2 (two) times daily.     . Melatonin 3 MG TABS Take 1 tablet by mouth at bedtime as needed.    . metFORMIN (GLUCOPHAGE-XR) 500 MG 24 hr tablet Take 1,000 mg by mouth at bedtime.     . propranolol (INDERAL) 20 MG tablet Take 20 mg by mouth 2 (two) times daily.      No current facility-administered medications on file prior to visit.     PAST MEDICAL HISTORY: Past Medical History:  Diagnosis Date  . Alopecia   . Asthma   . Back pain   . Diabetes mellitus ([place])   . GERD (gastroesophageal reflux disease)   . Hyperlipidemia   . Hypertension   . Joint pain   . Lactose intolerance   . Obesity   . Raynauds syndrome   . Swelling   . Tremor   .  Vitamin D deficiency     PAST SURGICAL HISTORY: Past Surgical History:  Procedure Laterality Date  . ABDOMINAL HYSTERECTOMY    . BREAST BIOPSY    . MYOMECTOMY      SOCIAL HISTORY: Social History   Tobacco Use  . Smoking status: Never Smoker  . Smokeless tobacco: Never Used  Substance Use Topics  . Alcohol use: No  . Drug use: No    FAMILY HISTORY: Family History  Problem Relation Age of Onset  . Multiple myeloma Mother   . Hypertension Mother   . Hyperlipidemia Mother   . Cancer Mother   . Obesity Mother   . Hypertension Father   . Hyperlipidemia Father   . Cancer Father   . Asthma Other   . Hyperlipidemia Other   . Hypertension Other   . Cancer Other   . COPD Other   . Stroke  Other   . Breast cancer Maternal Aunt   . Breast cancer Paternal Aunt   . Breast cancer Maternal Aunt     ROS: Review of Systems  Constitutional: Positive for weight loss.  Respiratory: Negative for shortness of breath.   Cardiovascular: Negative for chest pain.  Endo/Heme/Allergies:       Negative hypoglycemia    PHYSICAL EXAM: Blood pressure 120/84, pulse 94, temperature 98.2 F (36.8 C), temperature source Oral, height '5\' 4"'$  (1.626 m), weight 253 lb (114.8 kg), last menstrual period 08/31/2008, SpO2 98 %. Body mass index is 43.43 kg/m. Physical Exam  Constitutional: She is oriented to person, place, and time. She appears well-developed and well-nourished.  Cardiovascular: Normal rate.  Pulmonary/Chest: Effort normal.  Musculoskeletal: Normal range of motion.  Neurological: She is oriented to person, place, and time.  Skin: Skin is warm and dry.  Psychiatric: She has a normal mood and affect. Her behavior is normal.  Vitals reviewed.   RECENT LABS AND TESTS: BMET    Component Value Date/Time   NA 143 09/29/2016 0937   K 4.1 09/29/2016 0937   CL 102 09/29/2016 0937   CO2 23 09/29/2016 0937   GLUCOSE 114 (H) 09/29/2016 0937   GLUCOSE 99 09/10/2009 0540   BUN 15 09/29/2016 0937   CREATININE 0.74 09/29/2016 0937   CALCIUM 10.2 09/29/2016 0937   GFRNONAA 90 09/29/2016 0937   GFRAA 104 09/29/2016 0937   Lab Results  Component Value Date   HGBA1C 6.6 (H) 09/29/2016   HGBA1C 6.5 (H) 05/09/2016   HGBA1C 6.7 (H) 01/27/2016   HGBA1C 6.9 01/12/2016   HGBA1C 6.6 07/21/2015   Lab Results  Component Value Date   INSULIN 39.0 (H) 09/29/2016   INSULIN 38.5 (H) 05/09/2016   INSULIN 27.3 (H) 01/27/2016   CBC    Component Value Date/Time   WBC 6.8 01/27/2016 1006   WBC 11.1 (H) 09/10/2009 0540   RBC 4.28 01/27/2016 1006   RBC 3.89 09/10/2009 0540   HGB 12.1 01/27/2016 1006   HCT 37.0 01/27/2016 1006   PLT 241 09/10/2009 0540   MCV 86 01/27/2016 1006   MCH 28.3  01/27/2016 1006   MCH 29.0 09/10/2009 0540   MCHC 32.7 01/27/2016 1006   MCHC 33.0 09/10/2009 0540   RDW 13.3 01/27/2016 1006   LYMPHSABS 2.0 01/27/2016 1006   EOSABS 0.2 01/27/2016 1006   BASOSABS 0.0 01/27/2016 1006   Iron/TIBC/Ferritin/ %Sat No results found for: IRON, TIBC, FERRITIN, IRONPCTSAT Lipid Panel     Component Value Date/Time   CHOL 162 09/29/2016 0937   TRIG 140 09/29/2016  3614   HDL 52 09/29/2016 0937   LDLCALC 82 09/29/2016 0937   Hepatic Function Panel     Component Value Date/Time   PROT 7.0 09/29/2016 0937   ALBUMIN 4.3 09/29/2016 0937   AST 15 09/29/2016 0937   ALT 20 09/29/2016 0937   ALKPHOS 87 09/29/2016 0937   BILITOT 0.3 09/29/2016 4315      Component Value Date/Time   TSH 0.436 (L) 09/29/2016 0937   TSH 0.443 (L) 05/09/2016 1005   TSH 0.425 (L) 01/27/2016 1006    ASSESSMENT AND PLAN: Type 2 diabetes mellitus without complication, without long-term current use of insulin ([place]) - Plan: liraglutide 18 MG/3ML SOPN  Essential hypertension  At risk for heart disease  Class 3 severe obesity with serious comorbidity and body mass index (BMI) of 40.0 to 44.9 in adult, unspecified obesity type ([place])  PLAN:  Diabetes II without Insulin, without complications Veronica Russell has been given extensive diabetes education by myself today including ideal fasting and post-prandial blood glucose readings, individual ideal Hgb A1c goals and hypoglycemia prevention. We discussed the importance of good blood sugar control to decrease the likelihood of diabetic complications such as nephropathy, neuropathy, limb loss, blindness, coronary artery disease, and death. We discussed the importance of intensive lifestyle modification including diet, exercise and weight loss as the first line treatment for diabetes. Veronica Russell agrees to continue Victoza and we will refill for 1 month with 3 pens (Pt at 1.5 mg qd) and she will follow up with our clinic in 4 weeks.  Hypertension We  discussed sodium restriction, working on healthy weight loss, and a regular exercise program as the means to achieve improved blood pressure control. Veronica Russell agreed with this plan and agreed to follow up as directed. We will continue to monitor her blood pressure as well as her progress with the above lifestyle modifications. She will continue her medications as prescribed and will watch for signs of hypotension as she continues her lifestyle modifications. Veronica Russell agrees to follow up with our clinic in 4 weeks.  Cardiovascular risk counselling Veronica Russell was given extended (15 minutes) coronary artery disease prevention counseling today. She is 56 y.o. female and has risk factors for heart disease including obesity and hypertension. We discussed intensive lifestyle modifications today with an emphasis on specific weight loss instructions and strategies. Pt was also informed of the importance of increasing exercise and decreasing saturated fats to help prevent heart disease.  Obesity Veronica Russell is currently in the action stage of change. As such, her goal is to continue with weight loss efforts She has agreed to keep a food journal with 1200 calories and 85 grams of protein daily Veronica Russell has been instructed to work up to a goal of 150 minutes of combined cardio and strengthening exercise per week for weight loss and overall health benefits. We discussed the following Behavioral Modification Strategies today: increasing lean protein intake and keep a strict food journal   Veronica Russell has agreed to follow up with our clinic in 4 weeks. She was informed of the importance of frequent follow up visits to maximize her success with intensive lifestyle modifications for her multiple health conditions.  I, Trixie Dredge, am acting as transcriptionist for Veronica Duverney, PA-C  I have reviewed the above documentation for accuracy and completeness, and I agree with the above. -Veronica Duverney, PA-C  I have reviewed the above note and  agree with the plan. -Veronica Nip, MD     Today's visit was # 21 out of 22.  Starting weight: 259 lbs Starting date: 01/27/16 Today's weight : 253 lbs  Today's date: 01/12/2017 Total lbs lost to date: 6 (Patients must lose 7 lbs in the first 6 months to continue with counseling)   ASK: We discussed the diagnosis of obesity with Veronica Russell today and Veronica Russell agreed to give [name] permission to discuss obesity behavioral modification therapy today.  ASSESS: Veronica Russell has the diagnosis of obesity and her BMI today is 43.41 Veronica Russell is in the action stage of change   ADVISE: Veronica Russell was educated on the multiple health risks of obesity as well as the benefit of weight loss to improve her health. She was advised of the need for long term treatment and the importance of lifestyle modifications.  AGREE: Multiple dietary modification options and treatment options were discussed and  Veronica Russell agreed to keep a food journal with 1200 calories and 85 grams of protein daily We discussed the following Behavioral Modification Strategies today: increasing lean protein intake and keep a strict food journal

## 2017-01-20 MED FILL — VICTOZA 2-PAK 18 MG/3 ML PE: 18 | 30 days supply | Qty: 6 | Fill #1

## 2017-01-25 MED FILL — LISINOPRIL-HCTZ 20-12.5 MG: 20-12.5 | 90 days supply | Qty: 180 | Fill #0

## 2017-01-25 MED FILL — PROPRANOLOL 20 MG TABLET: 20 | 90 days supply | Qty: 180 | Fill #0

## 2017-01-25 MED FILL — AMLODIPINE BESYLATE 5 MG TA: 5 | 90 days supply | Qty: 90 | Fill #0

## 2017-01-25 MED FILL — METFORMIN HCL ER 500 MG TAB: 500 | 90 days supply | Qty: 180 | Fill #0

## 2017-01-25 MED FILL — ATORVASTATIN 20 MG TABLET: 20 | 90 days supply | Qty: 90 | Fill #0

## 2017-02-09 ENCOUNTER — Ambulatory Visit (INDEPENDENT_AMBULATORY_CARE_PROVIDER_SITE_OTHER): Payer: Self-pay | Admitting: Nurse Practitioner

## 2017-02-09 ENCOUNTER — Ambulatory Visit (INDEPENDENT_AMBULATORY_CARE_PROVIDER_SITE_OTHER): Payer: 59 | Admitting: Physician Assistant

## 2017-02-09 ENCOUNTER — Encounter: Payer: Self-pay | Admitting: Nurse Practitioner

## 2017-02-09 VITALS — BP 123/81 | HR 113 | Temp 98.0°F | Ht 64.0 in | Wt 255.0 lb

## 2017-02-09 VITALS — BP 124/90 | HR 100 | Temp 98.4°F | Resp 22 | Wt 259.4 lb

## 2017-02-09 DIAGNOSIS — J209 Acute bronchitis, unspecified: Secondary | ICD-10-CM

## 2017-02-09 DIAGNOSIS — I10 Essential (primary) hypertension: Secondary | ICD-10-CM

## 2017-02-09 DIAGNOSIS — E119 Type 2 diabetes mellitus without complications: Secondary | ICD-10-CM

## 2017-02-09 DIAGNOSIS — Z6841 Body Mass Index (BMI) 40.0 and over, adult: Secondary | ICD-10-CM | POA: Diagnosis not present

## 2017-02-09 DIAGNOSIS — R000 Tachycardia, unspecified: Secondary | ICD-10-CM

## 2017-02-09 DIAGNOSIS — Z9189 Other specified personal risk factors, not elsewhere classified: Secondary | ICD-10-CM | POA: Diagnosis not present

## 2017-02-09 DIAGNOSIS — K219 Gastro-esophageal reflux disease without esophagitis: Secondary | ICD-10-CM

## 2017-02-09 MED ORDER — OMEPRAZOLE 20 MG PO CPDR: 20.0000 mg | DELAYED_RELEASE_CAPSULE | Freq: Every day | ORAL | 0 refills | Status: DC

## 2017-02-09 MED ORDER — PREDNISONE 20 MG PO TABS: 20.0000 mg | ORAL_TABLET | Freq: Every day | ORAL | 0 refills | Status: AC

## 2017-02-09 MED ORDER — LIRAGLUTIDE 18 MG/3ML ~~LOC~~ SOPN: 1.5000 mg | PEN_INJECTOR | Freq: Every day | SUBCUTANEOUS | 0 refills | Status: DC

## 2017-02-09 NOTE — Patient Instructions (Addendum)
Acute Bronchitis, Adult Acute bronchitis is sudden (acute) swelling of the air tubes (bronchi) in the lungs. Acute bronchitis causes these tubes to fill with mucus, which can make it hard to breathe. It can also cause coughing or wheezing. In adults, acute bronchitis usually goes away within 2 weeks. A cough caused by bronchitis may last up to 3 weeks. Smoking, allergies, and asthma can make the condition worse. Repeated episodes of bronchitis may cause further lung problems, such as chronic obstructive pulmonary disease (COPD). What are the causes? This condition can be caused by germs and by substances that irritate the lungs, including:  Cold and flu viruses. This condition is most often caused by the same virus that causes a cold.  Bacteria.  Exposure to tobacco smoke, dust, fumes, and air pollution.  What increases the risk? This condition is more likely to develop in people who:  Have close contact with someone with acute bronchitis.  Are exposed to lung irritants, such as tobacco smoke, dust, fumes, and vapors.  Have a weak immune system.  Have a respiratory condition such as asthma.  What are the signs or symptoms? Symptoms of this condition include:  A cough.  Coughing up clear, yellow, or green mucus.  Wheezing.  Chest congestion.  Shortness of breath.  A fever.  Body aches.  Chills.  A sore throat.  How is this diagnosed? This condition is usually diagnosed with a physical exam. During the exam, your health care provider may order tests, such as chest X-rays, to rule out other conditions. He or she may also:  Test a sample of your mucus for bacterial infection.  Check the level of oxygen in your blood. This is done to check for pneumonia.  Do a chest X-ray or lung function testing to rule out pneumonia and other conditions.  Perform blood tests.  Your health care provider will also ask about your symptoms and medical history. How is this  treated? Most cases of acute bronchitis clear up over time without treatment. Your health care provider may recommend:  Drinking more fluids. Drinking more makes your mucus thinner, which may make it easier to breathe.  Taking a medicine for a fever or cough.  Taking an antibiotic medicine.  Using an inhaler to help improve shortness of breath and to control a cough.  Using a cool mist vaporizer or humidifier to make it easier to breathe.  Follow these instructions at home: Medicines  Take over-the-counter and prescription medicines only as told by your health care provider.  If you were prescribed an antibiotic, take it as told by your health care provider. Do not stop taking the antibiotic even if you start to feel better. General instructions  Get plenty of rest.  Drink enough fluids to keep your urine clear or pale yellow.  Avoid smoking and secondhand smoke. Exposure to cigarette smoke or irritating chemicals will make bronchitis worse. If you smoke and you need help quitting, ask your health care provider. Quitting smoking will help your lungs heal faster.  Use an inhaler, cool mist vaporizer, or humidifier as told by your health care provider.  Keep all follow-up visits as told by your health care provider. This is important. How is this prevented? To lower your risk of getting this condition again:  Wash your hands often with soap and water. If soap and water are not available, use hand sanitizer.  Avoid contact with people who have cold symptoms.  Try not to touch your hands to your   mouth, nose, or eyes.  Make sure to get the flu shot every year.  Contact a health care provider if:  Your symptoms do not improve in 2 weeks of treatment. Get help right away if:  You cough up blood.  You have chest pain.  You have severe shortness of breath.  You become dehydrated.  You faint or keep feeling like you are going to faint.  You keep vomiting.  You have a  severe headache.  Your fever or chills gets worse. This information is not intended to replace advice given to you by your health care provider. Make sure you discuss any questions you have with your health care provider. Document Released: 02/25/2004 Document Revised: 08/12/2015 Document Reviewed: 07/08/2015 Elsevier Interactive Patient Education  2018 [place] [employer].  Gastroesophageal Reflux Disease, Adult Normally, food travels down the esophagus and stays in the stomach to be digested. However, when a person has gastroesophageal reflux disease (GERD), food and stomach acid move back up into the esophagus. When this happens, the esophagus becomes sore and inflamed. Over time, GERD can create small holes (ulcers) in the lining of the esophagus. What are the causes? This condition is caused by a problem with the muscle between the esophagus and the stomach (lower esophageal sphincter, or LES). Normally, the LES muscle closes after food passes through the esophagus to the stomach. When the LES is weakened or abnormal, it does not close properly, and that allows food and stomach acid to go back up into the esophagus. The LES can be weakened by certain dietary substances, medicines, and medical conditions, including:  Tobacco use.  Pregnancy.  Having a hiatal hernia.  Heavy alcohol use.  Certain foods and beverages, such as coffee, chocolate, onions, and peppermint.  What increases the risk? This condition is more likely to develop in:  People who have an increased body weight.  People who have connective tissue disorders.  People who use NSAID medicines.  What are the signs or symptoms? Symptoms of this condition include:  Heartburn.  Difficult or painful swallowing.  The feeling of having a lump in the throat.  Abitter taste in the mouth.  Bad breath.  Having a large amount of saliva.  Having an upset or bloated stomach.  Belching.  Chest pain.  Shortness of breath  or wheezing.  Ongoing (chronic) cough or a night-time cough.  Wearing away of tooth enamel.  Weight loss.  Different conditions can cause chest pain. Make sure to see your health care provider if you experience chest pain. How is this diagnosed? Your health care provider will take a medical history and perform a physical exam. To determine if you have mild or severe GERD, your health care provider may also monitor how you respond to treatment. You may also have other tests, including:  An endoscopy toexamine your stomach and esophagus with a small camera.  A test thatmeasures the acidity level in your esophagus.  A test thatmeasures how much pressure is on your esophagus.  A barium swallow or modified barium swallow to show the shape, size, and functioning of your esophagus.  How is this treated? The goal of treatment is to help relieve your symptoms and to prevent complications. Treatment for this condition may vary depending on how severe your symptoms are. Your health care provider may recommend:  Changes to your diet.  Medicine.  Surgery.  Follow these instructions at home: Diet  Follow a diet as recommended by your health care provider. This may involve  avoiding foods and drinks such as: ? Coffee and tea (with or without caffeine). ? Drinks that containalcohol. ? Energy drinks and sports drinks. ? Carbonated drinks or sodas. ? Chocolate and cocoa. ? Peppermint and mint flavorings. ? Garlic and onions. ? Horseradish. ? Spicy and acidic foods, including peppers, chili powder, curry powder, vinegar, hot sauces, and barbecue sauce. ? Citrus fruit juices and citrus fruits, such as oranges, lemons, and limes. ? Tomato-based foods, such as red sauce, chili, salsa, and pizza with red sauce. ? Fried and fatty foods, such as donuts, french fries, potato chips, and high-fat dressings. ? High-fat meats, such as hot dogs and fatty cuts of red and white meats, such as rib eye  steak, sausage, ham, and bacon. ? High-fat dairy items, such as whole milk, butter, and cream cheese.  Eat small, frequent meals instead of large meals.  Avoid drinking large amounts of liquid with your meals.  Avoid eating meals during the 2-3 hours before bedtime.  Avoid lying down right after you eat.  Do not exercise right after you eat. General instructions  Pay attention to any changes in your symptoms.  Take over-the-counter and prescription medicines only as told by your health care provider. Do not take aspirin, ibuprofen, or other NSAIDs unless your health care provider told you to do so.  Do not use any tobacco products, including cigarettes, chewing tobacco, and e-cigarettes. If you need help quitting, ask your health care provider.  Wear loose-fitting clothing. Do not wear anything tight around your waist that causes pressure on your abdomen.  Raise (elevate) the head of your bed 6 inches (15cm).  Try to reduce your stress, such as with yoga or meditation. If you need help reducing stress, ask your health care provider.  If you are overweight, reduce your weight to an amount that is healthy for you. Ask your health care provider for guidance about a safe weight loss goal.  Keep all follow-up visits as told by your health care provider. This is important. Contact a health care provider if:  You have new symptoms.  You have unexplained weight loss.  You have difficulty swallowing, or it hurts to swallow.  You have wheezing or a persistent cough.  Your symptoms do not improve with treatment.  You have a hoarse voice. Get help right away if:  You have pain in your arms, neck, jaw, teeth, or back.  You feel sweaty, dizzy, or light-headed.  You have chest pain or shortness of breath.  You vomit and your vomit looks like blood or coffee grounds.  You faint.  Your stool is bloody or black.  You cannot swallow, drink, or eat. This information is not  intended to replace advice given to you by your health care provider. Make sure you discuss any questions you have with your health care provider. Document Released: 10/27/2004 Document Revised: 06/17/2015 Document Reviewed: 05/14/2014 Elsevier Interactive Patient Education  [employer].

## 2017-02-09 NOTE — Progress Notes (Signed)
Subjective:     Veronica Russell is a 57 y.o. female here for evaluation of a cough and intermittent wheezing. Onset of symptoms was 5 days ago. Symptoms have been unchanged since that time. The cough is dry and is aggravated by nothing. Associated symptoms include: wheezing. Patient does not have a history of asthma. Patient does not have a history of environmental allergens. Patient has not traveled recently. Patient does not have a history of smoking. Patient has not had a previous chest x-ray.  Patient states cough wakes her up from sleep.  Patient admits to having a history of GERD, but is not currently on medication.  Patient further admits to DOE or when simply walking up a flight of steps.  Patient uses Albuterol inhaler when needed previously prescribed for URI in November.  Patient was given nebulizer, prednisone and abx in November for URI.  Patient's last A1C- 6.6.  The following portions of the patient's history were reviewed and updated as appropriate: allergies, current medications and past medical history.  Review of Systems Constitutional: negative Eyes: negative Ears, nose, mouth, throat, and face: negative Respiratory: positive for cough, dyspnea on exertion, wheezing and questionable hx of asthma, negative for emphysema, hemoptysis, pneumonia and sputum Cardiovascular: negative Neurological: negative    Objective:    BP 124/90 (BP Location: Right Arm, Patient Position: Sitting, Cuff Size: Normal)   Pulse 100   Temp 98.4 F (36.9 C) (Oral)   Resp (!) 22   Wt 259 lb 6.4 oz (117.7 kg)   LMP 08/31/2008   SpO2 98%   BMI 44.53 kg/m  General appearance: alert, cooperative and no distress Head: Normocephalic, without obvious abnormality, atraumatic Eyes: conjunctivae/corneas clear. PERRL, EOM's intact. Fundi benign. Ears: normal TM's and external ear canals both ears Nose: no discharge, right turbinate swollen, inflamed, no sinus tenderness Throat: lips, mucosa, and tongue  normal; teeth and gums normal Lungs: wheezes posterior - lower lobe, expiratory, no distress Heart: regular rate and rhythm, S1, S2 normal, no murmur, click, rub or gallop Skin: Skin color, texture, turgor normal. No rashes or lesions Lymph nodes: cervical and submandibular nodes normal Neurologic: Grossly normal    Assessment:    Acute Bronchitis vs. GERD  Plan:    Explained lack of efficacy of antibiotics in viral disease. Avoid exposure to tobacco smoke and fumes. Call if shortness of breath worsens, blood in sputum, change in character of cough, development of fever or chills, inability to maintain nutrition and hydration. Avoid exposure to tobacco smoke and fumes. Patient will use Prilosec for three days to see if symptoms improve, if not, will use Prednisone 20mg  for 5 days.  Patient has Albuterol, which she will use every 6 hours as needed for wheezing. Discussed side effects of Prednisone and Albuterol with patient.  Patient encouraged to follow up with pulmonologist for definite dx of asthma.  Patient verbalizes understanding.

## 2017-02-10 MED FILL — OMEPRAZOLE 20 MG CAP: 20 | 30 days supply | Qty: 30 | Fill #0

## 2017-02-10 MED FILL — predniSONE 20 MG TABS: 20 | 5 days supply | Qty: 5 | Fill #0

## 2017-02-10 MED FILL — VICTOZA 18 MG/3 ML INJECT P: 18 | 36 days supply | Qty: 9 | Fill #0

## 2017-02-13 NOTE — Progress Notes (Addendum)
Office: [phone]  /  Fax: [phone]   HPI:   Chief Complaint: OBESITY Veronica Russell is here to discuss her progress with her obesity treatment plan. She is on the keep a food journal with 1200 calories and 85 grams of protein daily and is following her eating plan approximately 60 % of the time. She states she is walking for 30 minutes 3 times per week. Veronica Russell has had an increase in emotional eating and is not mindful of her eating. She states she would like to refocus on her weight loss and is motivated to get back on track and continue weight loss. Her weight is 255 lb (115.7 kg) today and has had a weight gain of 2 pounds over a period of 4 weeks since her last visit. She has lost 0 lbs since starting treatment with [name].  Diabetes II  Veronica Russell has a diagnosis of diabetes type II. Veronica Russell states fasting BGs range between 100's and 140's and she denies any hypoglycemic episodes. She has been working on intensive lifestyle modifications including diet, exercise, and weight loss to help control her blood glucose levels.  Hypertension Veronica Russell is a 57 y.o. female with hypertension. Veronica Russell denies chest pain or shortness of breath on exertion. She is working weight loss to help control her blood pressure with the goal of decreasing her risk of heart attack and stroke. [name] is currently stable.  Tachycardia Pt denies cp, dyspnea, palpitations. Denies dehydration. HR has been elevated on other visits in the office. 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 She has ultrasound of thyroid scheduled as per Endocrinologist.     At risk for cardiovascular disease [name] is at a higher than average risk for cardiovascular disease due to obesity, diabetes and hypertension. She currently denies any chest pain.  ALLERGIES: Allergies  Allergen Reactions  . Milk-Related Compounds     Lactose intollerance    MEDICATIONS: Current Outpatient  Medications on File Prior to Visit  Medication Sig Dispense Refill  . Alcohol Swabs (ALCOHOL WIPES) 70 % PADS 30 Packages by Does not apply route 2 (two) times daily. 100 each 0  . amLODipine (NORVASC) 5 MG tablet Take 5 mg by mouth daily.    Marland Kitchen atorvastatin (LIPITOR) 20 MG tablet Take 20 mg by mouth daily.    . calcium-vitamin D (OSCAL WITH D) 500-200 MG-UNIT tablet Take 2 tablets by mouth every morning. 60 tablet 0  . Clobetasol Propionate 0.05 % lotion Reported on 07/21/2015    . glucose blood (ACCU-CHEK GUIDE) test strip Use twice daily 100 each 0  . Insulin Pen Needle (BD PEN NEEDLE NANO U/F) 32G X 4 MM MISC 1 Package by Does not apply route daily. 100 each 0  . Lancets (ACCU-CHEK SAFE-T PRO) lancets Use as instructed 100 each 0  . lisinopril-hydrochlorothiazide (PRINZIDE,ZESTORETIC) 20-12.5 MG per tablet Take 1 tablet by mouth 2 (two) times daily.     . Melatonin 3 MG TABS Take 1 tablet by mouth at bedtime as needed.    . metFORMIN (GLUCOPHAGE-XR) 500 MG 24 hr tablet Take 1,000 mg by mouth at bedtime.     . propranolol (INDERAL) 20 MG tablet Take 20 mg by mouth 2 (two) times daily.      No current facility-administered medications on file prior to visit.     PAST MEDICAL HISTORY: Past Medical History:  Diagnosis Date  . Alopecia   . Asthma   . Back pain   .  Diabetes mellitus ([place])   . GERD (gastroesophageal reflux disease)   . Hyperlipidemia   . Hypertension   . Joint pain   . Lactose intolerance   . Obesity   . Raynauds syndrome   . Swelling   . Tremor   . Vitamin D deficiency     PAST SURGICAL HISTORY: Past Surgical History:  Procedure Laterality Date  . ABDOMINAL HYSTERECTOMY    . BREAST BIOPSY    . MYOMECTOMY      SOCIAL HISTORY: Social History   Tobacco Use  . Smoking status: Never Smoker  . Smokeless tobacco: Never Used  Substance Use Topics  . Alcohol use: No  . Drug use: No    FAMILY HISTORY: Family History  Problem Relation Age of Onset  .  Multiple myeloma Mother   . Hypertension Mother   . Hyperlipidemia Mother   . Cancer Mother   . Obesity Mother   . Hypertension Father   . Hyperlipidemia Father   . Cancer Father   . Asthma Other   . Hyperlipidemia Other   . Hypertension Other   . Cancer Other   . COPD Other   . Stroke Other   . Breast cancer Maternal Aunt   . Breast cancer Paternal Aunt   . Breast cancer Maternal Aunt     ROS: Review of Systems  Constitutional: Negative for weight loss.  Respiratory: Negative for shortness of breath (on exertion).   Cardiovascular: Negative for chest pain.  Endo/Heme/Allergies:       Negative hypoglycemia    PHYSICAL EXAM: Blood pressure 123/81, pulse (!) 113, temperature 98 F (36.7 C), temperature source Oral, height '5\' 4"'$  (1.626 m), weight 255 lb (115.7 kg), last menstrual period 08/31/2008, SpO2 91 %. Body mass index is 43.77 kg/m. Physical Exam  Constitutional: She is oriented to person, place, and time. She appears well-developed and well-nourished.  Cardiovascular:  Tachycardic  Pulmonary/Chest: Effort normal.  Musculoskeletal: Normal range of motion.  Neurological: She is oriented to person, place, and time.  Skin: Skin is warm and dry.  Psychiatric: She has a normal mood and affect. Her behavior is normal.  Vitals reviewed.   RECENT LABS AND TESTS: BMET    Component Value Date/Time   NA 143 09/29/2016 0937   K 4.1 09/29/2016 0937   CL 102 09/29/2016 0937   CO2 23 09/29/2016 0937   GLUCOSE 114 (H) 09/29/2016 0937   GLUCOSE 99 09/10/2009 0540   BUN 15 09/29/2016 0937   CREATININE 0.74 09/29/2016 0937   CALCIUM 10.2 09/29/2016 0937   GFRNONAA 90 09/29/2016 0937   GFRAA 104 09/29/2016 0937   Lab Results  Component Value Date   HGBA1C 6.6 (H) 09/29/2016   HGBA1C 6.5 (H) 05/09/2016   HGBA1C 6.7 (H) 01/27/2016   HGBA1C 6.9 01/12/2016   HGBA1C 6.6 07/21/2015   Lab Results  Component Value Date   INSULIN 39.0 (H) 09/29/2016   INSULIN 38.5 (H)  05/09/2016   INSULIN 27.3 (H) 01/27/2016   CBC    Component Value Date/Time   WBC 6.8 01/27/2016 1006   WBC 11.1 (H) 09/10/2009 0540   RBC 4.28 01/27/2016 1006   RBC 3.89 09/10/2009 0540   HGB 12.1 01/27/2016 1006   HCT 37.0 01/27/2016 1006   PLT 241 09/10/2009 0540   MCV 86 01/27/2016 1006   MCH 28.3 01/27/2016 1006   MCH 29.0 09/10/2009 0540   MCHC 32.7 01/27/2016 1006   MCHC 33.0 09/10/2009 0540   RDW 13.3 01/27/2016  1006   LYMPHSABS 2.0 01/27/2016 1006   EOSABS 0.2 01/27/2016 1006   BASOSABS 0.0 01/27/2016 1006   Iron/TIBC/Ferritin/ %Sat No results found for: IRON, TIBC, FERRITIN, IRONPCTSAT Lipid Panel     Component Value Date/Time   CHOL 162 09/29/2016 0937   TRIG 140 09/29/2016 0937   HDL 52 09/29/2016 0937   LDLCALC 82 09/29/2016 0937   Hepatic Function Panel     Component Value Date/Time   PROT 7.0 09/29/2016 0937   ALBUMIN 4.3 09/29/2016 0937   AST 15 09/29/2016 0937   ALT 20 09/29/2016 0937   ALKPHOS 87 09/29/2016 0937   BILITOT 0.3 09/29/2016 0937      Component Value Date/Time   TSH 0.436 (L) 09/29/2016 0937   TSH 0.443 (L) 05/09/2016 1005   TSH 0.425 (L) 01/27/2016 1006    ASSESSMENT AND PLAN: Type 2 diabetes mellitus without complication, without long-term current use of insulin (HCC) - Plan: liraglutide (VICTOZA) 18 MG/3ML SOPN  Essential hypertension  At risk for heart disease  Class 3 severe obesity with serious comorbidity and body mass index (BMI) of 40.0 to 44.9 in adult, unspecified obesity type ([place])  PLAN:  Diabetes II Aniaya has been given extensive diabetes education by myself today including ideal fasting and post-prandial blood glucose readings, individual ideal Hgb A1c goals and hypoglycemia prevention. We discussed the importance of good blood sugar control to decrease the likelihood of diabetic complications such as nephropathy, neuropathy, limb loss, blindness, coronary artery disease, and death. We discussed the importance  of intensive lifestyle modification including diet, exercise and weight loss as the first line treatment for diabetes. Alka agrees to continue Victoza (patient at 1.5 mg) we will refill 3 pens and she will follow up at the agreed upon time.  Hypertension We discussed sodium restriction, working on healthy weight loss, and a regular exercise program as the means to achieve improved blood pressure control. Julann agreed with this plan and agreed to follow up as directed. We will continue to monitor her blood pressure as well as her progress with the above lifestyle modifications. She will continue her medications as prescribed and will watch for signs of hypotension as she continues her lifestyle modifications.  Tachycardia Pt educated on the risk associated with tachycardia. She is encouraged to follow up with Endocrinology as suggested, as this might be related to hyperthyroidism. Also, she is encouraged to follow up with her PCP for further evaluation. She is advised if tachycardia persists, or if she develops any cp, dyspnea, palpitations, dizziness, to go to the ER.  Cardiovascular risk counseling Geovanna was given extended (15 minutes) coronary artery disease prevention counseling today. She is 57 y.o. female and has risk factors for heart disease including obesity, diabetes and hypertension. We discussed intensive lifestyle modifications today with an emphasis on specific weight loss instructions and strategies. Pt was also informed of the importance of increasing exercise and decreasing saturated fats to help prevent heart disease.  Obesity Brittane is currently in the action stage of change. As such, her goal is to continue with weight loss efforts She has agreed to keep a food journal with 1200 calories and 85 grams of protein daily Alie has been instructed to work up to a goal of 150 minutes of combined cardio and strengthening exercise per week for weight loss and overall health benefits. We  discussed the following Behavioral Modification Strategies today: increasing lean protein intake and keep a strict food journal  Lavonia has agreed to follow up  with our clinic in 2 weeks. She was informed of the importance of frequent follow up visits to maximize her success with intensive lifestyle modifications for her multiple health conditions.  Corey Skains, am acting as transcriptionist for Lacy Duverney, [facility] have reviewed this note and agree with its contents    OBESITY BEHAVIORAL INTERVENTION VISIT  Today's visit was # 22 out of 25.  Starting weight: 259 lbs Starting date: 01/27/16 Today's weight : 259 lbs  Today's date: 02/09/2017 Total lbs lost to date: 0 (Patients must lose 7 lbs in the first 6 months to continue with counseling)   ASK: We discussed the diagnosis of obesity with Veronica Russell today and Veronica Russell agreed to give [name] permission to discuss obesity behavioral modification therapy today.  ASSESS: [place] has the diagnosis of obesity and her BMI today is 22.5 Kiaja is in the action stage of change   ADVISE: Parveen was educated on the multiple health risks of obesity as well as the benefit of weight loss to improve her health. She was advised of the need for long term treatment and the importance of lifestyle modifications.  AGREE: Multiple dietary modification options and treatment options were discussed and  Agustina agreed to the above obesity treatment plan  Addendum 02/13/17: On review of vitals, pt's heart rate elevated at 113. Called pt on phone number on file, relayed concern with elevated HR. Pt declines any shortness of breath, chest pain, palpitations or dizziness. Pt encouraged to follow up with her PCP. She is advised if she develops any of these symptoms or if tachycardia continues to go to the ER.  I have reviewed the above documentation for accuracy and completeness, and I agree with the above. -Dennard Nip, MD

## 2017-02-20 ENCOUNTER — Encounter: Payer: Self-pay | Admitting: Nurse Practitioner

## 2017-02-20 ENCOUNTER — Ambulatory Visit (INDEPENDENT_AMBULATORY_CARE_PROVIDER_SITE_OTHER): Payer: Self-pay | Admitting: Nurse Practitioner

## 2017-02-20 VITALS — BP 110/90 | HR 101 | Temp 98.6°F | Wt 259.6 lb

## 2017-02-20 DIAGNOSIS — J4520 Mild intermittent asthma, uncomplicated: Secondary | ICD-10-CM

## 2017-02-20 MED ORDER — OMEPRAZOLE 20 MG PO CPDR: 20.0000 mg | DELAYED_RELEASE_CAPSULE | Freq: Every day | ORAL | 1 refills | Status: DC

## 2017-02-20 MED ORDER — OMEPRAZOLE 20 MG PO CPDR: 20.0000 mg | DELAYED_RELEASE_CAPSULE | Freq: Every day | ORAL | 3 refills | Status: DC

## 2017-02-20 MED ORDER — HYDROCODONE-HOMATROPINE 5-1.5 MG/5ML PO SYRP: 5.0000 mL | ORAL_SOLUTION | Freq: Three times a day (TID) | ORAL | 0 refills | Status: AC | PRN

## 2017-02-20 NOTE — Progress Notes (Signed)
Subjective:     Veronica Russell is a 57 y.o. female here for evaluation of a cough. Onset of symptoms was several weeks ago. Symptoms have been improving while on prednisone, but once stopped the coughing started again since that time. The cough is nonproductive and is aggravated by reclining position. Associated symptoms include: wheezing. Patient does not have a history of asthma. Patient does not have a history of environmental allergens. Patient has not traveled recently. Patient does not have a history of smoking. Patient has not had a previous chest x-ray. Patient was seen at [facility] on 1/10 and trial discussed with patient regarding GERD vs. Bronchitis.  Patient states the Prilosec that was prescribed has helped greatly, but she cannot get rid of the cough. Prednisone also helped, but she understands that cannot be long-term due to her DM.  Patient has been using her albuterol inhaler for intermittent wheezing, greater than 4-5 times/week. Cough is worse at night and keeps the patient from sleeping.  The following portions of the patient's history were reviewed and updated as appropriate: allergies, current medications and past medical history.  Review of Systems Constitutional: positive for fatigue, negative for chills, fevers, malaise and sweats Eyes: negative Ears, nose, mouth, throat, and face: negative Respiratory: positive for cough and wheezing Cardiovascular: negative Neurological: negative Behavioral/Psych: negative Allergic/Immunologic: negative    Objective:    BP 110/90   Pulse (!) 101   Temp 98.6 F (37 C)   Wt 259 lb 9.6 oz (117.8 kg)   LMP 08/31/2008   SpO2 98%   BMI 44.56 kg/m  General appearance: alert, cooperative, fatigued and no distress Head: Normocephalic, without obvious abnormality, atraumatic Eyes: conjunctivae/corneas clear. PERRL, EOM's intact. Fundi benign. Ears: normal TM's and external ear canals both ears Nose: Nares normal. Septum midline.  Mucosa normal. No drainage or sinus tenderness. Throat: lips, mucosa, and tongue normal; teeth and gums normal Lungs: wheezes LLL, RUL and expiratory Heart: regular rate and rhythm, S1, S2 normal, no murmur, click, rub or gallop Pulses: 2+ and symmetric Skin: Skin color, texture, turgor normal. No rashes or lesions Lymph nodes: cervical and submandibular nodes normal Neurologic: Grossly normal    Assessment:    Reactive Airway Disease    Plan:    Explained lack of efficacy of antibiotics in viral disease. Antitussives per medication orders. Avoid exposure to tobacco smoke and fumes. Call if shortness of breath worsens, blood in sputum, change in character of cough, development of fever or chills, inability to maintain nutrition and hydration. Avoid exposure to tobacco smoke and fumes. Discussed with patient the need for a pulmonology consult.  Patient instructed to continue using Albuterol as needed.  Hycodan syrup prescribed for worsening cough. Patient verbalizes understanding.

## 2017-02-20 NOTE — Patient Instructions (Addendum)
Cough, Adult Coughing is a reflex that clears your throat and your airways. Coughing helps to heal and protect your lungs. It is normal to cough occasionally, but a cough that happens with other symptoms or lasts a long time may be a sign of a condition that needs treatment. A cough may last only 2-3 weeks (acute), or it may last longer than 8 weeks (chronic). What are the causes? Coughing is commonly caused by:  Breathing in substances that irritate your lungs.  A viral or bacterial respiratory infection.  Allergies.  Asthma.  Postnasal drip.  Smoking.  Acid backing up from the stomach into the esophagus (gastroesophageal reflux).  Certain medicines.  Chronic lung problems, including COPD (or rarely, lung cancer).  Other medical conditions such as heart failure.  Follow these instructions at home: Pay attention to any changes in your symptoms. Take these actions to help with your discomfort:  Take medicines only as told by your health care provider. ? If you were prescribed an antibiotic medicine, take it as told by your health care provider. Do not stop taking the antibiotic even if you start to feel better. ? Talk with your health care provider before you take a cough suppressant medicine.  Drink enough fluid to keep your urine clear or pale yellow.  If the air is dry, use a cold steam vaporizer or humidifier in your bedroom or your home to help loosen secretions.  Avoid anything that causes you to cough at work or at home.  If your cough is worse at night, try sleeping in a semi-upright position.  Avoid cigarette smoke. If you smoke, quit smoking. If you need help quitting, ask your health care provider.  Avoid caffeine.  Avoid alcohol.  Rest as needed.  Contact a health care provider if:  You have new symptoms.  You cough up pus.  Your cough does not get better after 2-3 weeks, or your cough gets worse.  You cannot control your cough with suppressant  medicines and you are losing sleep.  You develop pain that is getting worse or pain that is not controlled with pain medicines.  You have a fever.  You have unexplained weight loss.  You have night sweats. Get help right away if:  You cough up blood.  You have difficulty breathing.  Your heartbeat is very fast. This information is not intended to replace advice given to you by your health care provider. Make sure you discuss any questions you have with your health care provider. Document Released: 07/16/2010 Document Revised: 06/25/2015 Document Reviewed: 03/26/2014 Elsevier Interactive Patient Education  2018 [employer].  Asthma, Adult Asthma is a recurring condition in which the airways tighten and narrow. Asthma can make it difficult to breathe. It can cause coughing, wheezing, and shortness of breath. Asthma episodes, also called asthma attacks, range from minor to life-threatening. Asthma cannot be cured, but medicines and lifestyle changes can help control it. What are the causes? Asthma is believed to be caused by inherited (genetic) and environmental factors, but its exact cause is unknown. Asthma may be triggered by allergens, lung infections, or irritants in the air. Asthma triggers are different for each person. Common triggers include:  Animal dander.  Dust mites.  Cockroaches.  Pollen from trees or grass.  Mold.  Smoke.  Air pollutants such as dust, household cleaners, hair sprays, aerosol sprays, paint fumes, strong chemicals, or strong odors.  Cold air, weather changes, and winds (which increase molds and pollens in the air).  Strong emotional expressions such as crying or laughing hard.  Stress.  Certain medicines (such as aspirin) or types of drugs (such as beta-blockers).  Sulfites in foods and drinks. Foods and drinks that may contain sulfites include dried fruit, potato chips, and sparkling grape juice.  Infections or inflammatory conditions  such as the flu, a cold, or an inflammation of the nasal membranes (rhinitis).  Gastroesophageal reflux disease (GERD).  Exercise or strenuous activity.  What are the signs or symptoms? Symptoms may occur immediately after asthma is triggered or many hours later. Symptoms include:  Wheezing.  Excessive nighttime or early morning coughing.  Frequent or severe coughing with a common cold.  Chest tightness.  Shortness of breath.  How is this diagnosed? The diagnosis of asthma is made by a review of your medical history and a physical exam. Tests may also be performed. These may include:  Lung function studies. These tests show how much air you breathe in and out.  Allergy tests.  Imaging tests such as X-rays.  How is this treated? Asthma cannot be cured, but it can usually be controlled. Treatment involves identifying and avoiding your asthma triggers. It also involves medicines. There are 2 classes of medicine used for asthma treatment:  Controller medicines. These prevent asthma symptoms from occurring. They are usually taken every day.  Reliever or rescue medicines. These quickly relieve asthma symptoms. They are used as needed and provide short-term relief.  Your health care provider will help you create an asthma action plan. An asthma action plan is a written plan for managing and treating your asthma attacks. It includes a list of your asthma triggers and how they may be avoided. It also includes information on when medicines should be taken and when their dosage should be changed. An action plan may also involve the use of a device called a peak flow meter. A peak flow meter measures how well the lungs are working. It helps you monitor your condition. Follow these instructions at home:  Take medicines only as directed by your health care provider. Speak with your health care provider if you have questions about how or when to take the medicines.  Use a peak flow meter as  directed by your health care provider. Record and keep track of readings.  Understand and use the action plan to help minimize or stop an asthma attack without needing to seek medical care.  Control your home environment in the following ways to help prevent asthma attacks: ? Do not smoke. Avoid being exposed to secondhand smoke. ? Change your heating and air conditioning filter regularly. ? Limit your use of fireplaces and wood stoves. ? Get rid of pests (such as roaches and mice) and their droppings. ? Throw away plants if you see mold on them. ? Clean your floors and dust regularly. Use unscented cleaning products. ? Try to have someone else vacuum for you regularly. Stay out of rooms while they are being vacuumed and for a short while afterward. If you vacuum, use a dust mask from a hardware store, a double-layered or microfilter vacuum cleaner bag, or a vacuum cleaner with a HEPA filter. ? Replace carpet with wood, tile, or vinyl flooring. Carpet can trap dander and dust. ? Use allergy-proof pillows, mattress covers, and box spring covers. ? Wash bed sheets and blankets every week in hot water and dry them in a dryer. ? Use blankets that are made of polyester or cotton. ? Clean bathrooms and kitchens with bleach.  If possible, have someone repaint the walls in these rooms with mold-resistant paint. Keep out of the rooms that are being cleaned and painted. ? Wash hands frequently. Contact a health care provider if:  You have wheezing, shortness of breath, or a cough even if taking medicine to prevent attacks.  The colored mucus you cough up (sputum) is thicker than usual.  Your sputum changes from clear or white to yellow, green, gray, or bloody.  You have any problems that may be related to the medicines you are taking (such as a rash, itching, swelling, or trouble breathing).  You are using a reliever medicine more than 2-3 times per week.  Your peak flow is still at 50-79% of your  personal best after following your action plan for 1 hour.  You have a fever. Get help right away if:  You seem to be getting worse and are unresponsive to treatment during an asthma attack.  You are short of breath even at rest.  You get short of breath when doing very little physical activity.  You have difficulty eating, drinking, or talking due to asthma symptoms.  You develop chest pain.  You develop a fast heartbeat.  You have a bluish color to your lips or fingernails.  You are light-headed, dizzy, or faint.  Your peak flow is less than 50% of your personal best. This information is not intended to replace advice given to you by your health care provider. Make sure you discuss any questions you have with your health care provider. Document Released: 01/17/2005 Document Revised: 07/01/2015 Document Reviewed: 08/16/2012 Elsevier Interactive Patient Education  2017 [employer].

## 2017-02-22 ENCOUNTER — Telehealth: Payer: Self-pay | Admitting: Emergency Medicine

## 2017-02-22 MED FILL — HYDROCODONE-HOMATROPINE SYR: 5-1.5 | 8 days supply | Qty: 120 | Fill #0

## 2017-02-23 ENCOUNTER — Ambulatory Visit (INDEPENDENT_AMBULATORY_CARE_PROVIDER_SITE_OTHER): Payer: 59 | Admitting: Physician Assistant

## 2017-02-23 VITALS — BP 106/72 | HR 103 | Temp 98.0°F | Ht 64.0 in | Wt 255.0 lb

## 2017-02-23 DIAGNOSIS — E119 Type 2 diabetes mellitus without complications: Secondary | ICD-10-CM

## 2017-02-23 DIAGNOSIS — Z6841 Body Mass Index (BMI) 40.0 and over, adult: Secondary | ICD-10-CM | POA: Diagnosis not present

## 2017-02-23 DIAGNOSIS — Z9189 Other specified personal risk factors, not elsewhere classified: Secondary | ICD-10-CM

## 2017-02-23 DIAGNOSIS — I10 Essential (primary) hypertension: Secondary | ICD-10-CM | POA: Diagnosis not present

## 2017-02-23 MED ORDER — ALCOHOL WIPES 70 % PADS: 30.0000 | MEDICATED_PAD | Freq: Two times a day (BID) | 0 refills | Status: DC

## 2017-02-23 MED ORDER — ACCU-CHEK SAFE-T PRO LANCETS MISC: 0 refills | Status: DC

## 2017-02-23 MED ORDER — GLUCOSE BLOOD VI STRP: ORAL_STRIP | 0 refills | Status: DC

## 2017-02-23 NOTE — Progress Notes (Signed)
Office: [phone]  /  Fax: [phone]   HPI:   Chief Complaint: OBESITY Veronica Russell is here to discuss her progress with her obesity treatment plan. She is on the keep a food journal with 1200 calories and 85 grams of protein daily and is following her eating plan approximately 70 % of the time. She states she is walking for 30 minutes 3 times per week. [name] maintained her weight loss. She has been sick with upper respiratory infection and has been on prednisone.  Her weight is 255 lb (115.7 kg) today and has not lost weight since her last visit. She has lost 4 lbs since starting treatment with [name].  Diabetes II Veronica Russell has a diagnosis of diabetes type II. Hettie states fasting BGs range between 110's and 190's. She denies any hypoglycemic episodes. She is on metformin and Victoza. Last A1c was 6.6 on 09/29/16. She has been working on intensive lifestyle modifications including diet, exercise, and weight loss to help control her blood glucose levels.  Hypertension Veronica Russell is a 57 y.o. female with hypertension. Veronica Russell's blood pressure is elevated and she denies chest pain or shortness of breath. She is working weight loss to help control her blood pressure with the goal of decreasing her risk of heart attack and stroke. Veronica Russell's blood pressure is not currently controlled.  At risk for cardiovascular disease Veronica Russell is at a higher than average risk for cardiovascular disease due to obesity and hypertension. She currently denies any chest pain.  ALLERGIES: Allergies  Allergen Reactions  . Milk-Related Compounds     Lactose intollerance    MEDICATIONS: Current Outpatient Medications on File Prior to Visit  Medication Sig Dispense Refill  . amLODipine (NORVASC) 5 MG tablet Take 5 mg by mouth daily.    Marland Kitchen atorvastatin (LIPITOR) 20 MG tablet Take 20 mg by mouth daily.    . calcium-vitamin D (OSCAL WITH D) 500-200 MG-UNIT tablet Take 2 tablets by mouth every morning. 60 tablet 0  .  Clobetasol Propionate 0.05 % lotion Reported on 07/21/2015    . HYDROcodone-homatropine (HYCODAN) 5-1.5 MG/5ML syrup Take 5 mLs by mouth every 8 (eight) hours as needed for up to 7 days for cough. 120 mL 0  . Insulin Pen Needle (BD PEN NEEDLE NANO U/F) 32G X 4 MM MISC 1 Package by Does not apply route daily. 100 each 0  . liraglutide (VICTOZA) 18 MG/3ML SOPN Inject 0.25 mLs (1.5 mg total) into the skin daily. 3 pen 0  . lisinopril-hydrochlorothiazide (PRINZIDE,ZESTORETIC) 20-12.5 MG per tablet Take 1 tablet by mouth 2 (two) times daily.     . Melatonin 3 MG TABS Take 1 tablet by mouth at bedtime as needed.    . metFORMIN (GLUCOPHAGE-XR) 500 MG 24 hr tablet Take 1,000 mg by mouth at bedtime.     Marland Kitchen omeprazole (PRILOSEC) 20 MG capsule Take 1 capsule (20 mg total) by mouth daily. 30 capsule 1  . propranolol (INDERAL) 20 MG tablet Take 20 mg by mouth 2 (two) times daily.      No current facility-administered medications on file prior to visit.     PAST MEDICAL HISTORY: Past Medical History:  Diagnosis Date  . Alopecia   . Asthma   . Back pain   . Diabetes mellitus ([place])   . GERD (gastroesophageal reflux disease)   . Hyperlipidemia   . Hypertension   . Joint pain   . Lactose intolerance   . Obesity   . Raynauds syndrome   .  Swelling   . Tremor   . Vitamin D deficiency     PAST SURGICAL HISTORY: Past Surgical History:  Procedure Laterality Date  . ABDOMINAL HYSTERECTOMY    . BREAST BIOPSY    . MYOMECTOMY      SOCIAL HISTORY: Social History   Tobacco Use  . Smoking status: Never Smoker  . Smokeless tobacco: Never Used  Substance Use Topics  . Alcohol use: No  . Drug use: No    FAMILY HISTORY: Family History  Problem Relation Age of Onset  . Multiple myeloma Mother   . Hypertension Mother   . Hyperlipidemia Mother   . Cancer Mother   . Obesity Mother   . Hypertension Father   . Hyperlipidemia Father   . Cancer Father   . Asthma Other   . Hyperlipidemia Other   .  Hypertension Other   . Cancer Other   . COPD Other   . Stroke Other   . Breast cancer Maternal Aunt   . Breast cancer Paternal Aunt   . Breast cancer Maternal Aunt     ROS: Review of Systems  Constitutional: Negative for weight loss.  Respiratory: Negative for shortness of breath.   Cardiovascular: Negative for chest pain.  Endo/Heme/Allergies:       Negative hypoglycemia    PHYSICAL EXAM: Blood pressure 106/72, pulse (!) 103, temperature 98 F (36.7 C), temperature source Oral, height 5' 4" (1.626 m), weight 255 lb (115.7 kg), last menstrual period 08/31/2008, SpO2 99 %. Body mass index is 43.77 kg/m. Physical Exam  Constitutional: She is oriented to person, place, and time. She appears well-developed and well-nourished.  Cardiovascular: Tachycardia present.  Pulmonary/Chest: Effort normal.  Musculoskeletal: Normal range of motion.  Neurological: She is oriented to person, place, and time.  Skin: Skin is warm and dry.  Psychiatric: She has a normal mood and affect. Her behavior is normal.  Vitals reviewed.   RECENT LABS AND TESTS: BMET    Component Value Date/Time   NA 143 09/29/2016 0937   K 4.1 09/29/2016 0937   CL 102 09/29/2016 0937   CO2 23 09/29/2016 0937   GLUCOSE 114 (H) 09/29/2016 0937   GLUCOSE 99 09/10/2009 0540   BUN 15 09/29/2016 0937   CREATININE 0.74 09/29/2016 0937   CALCIUM 10.2 09/29/2016 0937   GFRNONAA 90 09/29/2016 0937   GFRAA 104 09/29/2016 0937   Lab Results  Component Value Date   HGBA1C 6.6 (H) 09/29/2016   HGBA1C 6.5 (H) 05/09/2016   HGBA1C 6.7 (H) 01/27/2016   HGBA1C 6.9 01/12/2016   HGBA1C 6.6 07/21/2015   Lab Results  Component Value Date   INSULIN 39.0 (H) 09/29/2016   INSULIN 38.5 (H) 05/09/2016   INSULIN 27.3 (H) 01/27/2016   CBC    Component Value Date/Time   WBC 6.8 01/27/2016 1006   WBC 11.1 (H) 09/10/2009 0540   RBC 4.28 01/27/2016 1006   RBC 3.89 09/10/2009 0540   HGB 12.1 01/27/2016 1006   HCT 37.0  01/27/2016 1006   PLT 241 09/10/2009 0540   MCV 86 01/27/2016 1006   MCH 28.3 01/27/2016 1006   MCH 29.0 09/10/2009 0540   MCHC 32.7 01/27/2016 1006   MCHC 33.0 09/10/2009 0540   RDW 13.3 01/27/2016 1006   LYMPHSABS 2.0 01/27/2016 1006   EOSABS 0.2 01/27/2016 1006   BASOSABS 0.0 01/27/2016 1006   Iron/TIBC/Ferritin/ %Sat No results found for: IRON, TIBC, FERRITIN, IRONPCTSAT Lipid Panel     Component Value Date/Time  CHOL 162 09/29/2016 0937   TRIG 140 09/29/2016 0937   HDL 52 09/29/2016 0937   LDLCALC 82 09/29/2016 0937   Hepatic Function Panel     Component Value Date/Time   PROT 7.0 09/29/2016 0937   ALBUMIN 4.3 09/29/2016 0937   AST 15 09/29/2016 0937   ALT 20 09/29/2016 0937   ALKPHOS 87 09/29/2016 0937   BILITOT 0.3 09/29/2016 0937      Component Value Date/Time   TSH 0.436 (L) 09/29/2016 0937   TSH 0.443 (L) 05/09/2016 1005   TSH 0.425 (L) 01/27/2016 1006    ASSESSMENT AND PLAN: Type 2 diabetes mellitus without complication, without long-term current use of insulin ([place]) - Plan: Alcohol Swabs (ALCOHOL WIPES) 70 % PADS, glucose blood (ACCU-CHEK GUIDE) test strip, Lancets (ACCU-CHEK SAFE-T PRO) lancets  Essential hypertension  At risk for heart disease  Class 3 severe obesity with serious comorbidity and body mass index (BMI) of 40.0 to 44.9 in adult, unspecified obesity type (HCC)  PLAN:  Diabetes II Sugey has been given extensive diabetes education by myself today including ideal fasting and post-prandial blood glucose readings, individual ideal Hgb A1c goals and hypoglycemia prevention. We discussed the importance of good blood sugar control to decrease the likelihood of diabetic complications such as nephropathy, neuropathy, limb loss, blindness, coronary artery disease, and death. We discussed the importance of intensive lifestyle modification including diet, exercise and weight loss as the first line treatment for diabetes. Shanik agrees to continue her  diabetes medications as prescribed and we will refill alcohol wipes, lancets, and test strips for 1 month. Dynasti agrees to follow up with our clinic in 2 weeks.   Hypertension We discussed sodium restriction, working on healthy weight loss, and a regular exercise program as the means to achieve improved blood pressure control. Lorrie agreed with this plan and agreed to follow up as directed. We will continue to monitor her blood pressure as well as her progress with the above lifestyle modifications. She will continue her medications as prescribed and will watch for signs of hypotension as she continues her lifestyle modifications. Caelynn agrees to follow up with our clinic in 2 weeks.  Cardiovascular risk counselling Melanye was given extended (15 minutes) coronary artery disease prevention counseling today. She is 57 y.o. female and has risk factors for heart disease including obesity and hypertension. We discussed intensive lifestyle modifications today with an emphasis on specific weight loss instructions and strategies. Pt was also informed of the importance of increasing exercise and decreasing saturated fats to help prevent heart disease.  Obesity Jeralynn is currently in the action stage of change. As such, her goal is to continue with weight loss efforts She has agreed to keep a food journal with 1200 calories and 85 grams of protein daily Shermika has been instructed to work up to a goal of 150 minutes of combined cardio and strengthening exercise per week for weight loss and overall health benefits. We discussed the following Behavioral Modification Strategies today: increasing lean protein intake and work on meal planning and easy cooking plans   Lindalee has agreed to follow up with our clinic in 2 weeks. She was informed of the importance of frequent follow up visits to maximize her success with intensive lifestyle modifications for her multiple health conditions.   Wilhemena Durie, am acting as  transcriptionist for Lacy Duverney, PA-C I, Lacy Duverney [facility], have reviewed this note and agree with its contents.

## 2017-02-24 DIAGNOSIS — R062 Wheezing: Secondary | ICD-10-CM | POA: Diagnosis not present

## 2017-02-24 DIAGNOSIS — I10 Essential (primary) hypertension: Secondary | ICD-10-CM | POA: Diagnosis not present

## 2017-02-24 DIAGNOSIS — E119 Type 2 diabetes mellitus without complications: Secondary | ICD-10-CM | POA: Diagnosis not present

## 2017-02-24 DIAGNOSIS — E559 Vitamin D deficiency, unspecified: Secondary | ICD-10-CM | POA: Diagnosis not present

## 2017-02-24 DIAGNOSIS — E782 Mixed hyperlipidemia: Secondary | ICD-10-CM | POA: Diagnosis not present

## 2017-02-24 DIAGNOSIS — J9801 Acute bronchospasm: Secondary | ICD-10-CM | POA: Diagnosis not present

## 2017-02-24 DIAGNOSIS — K219 Gastro-esophageal reflux disease without esophagitis: Secondary | ICD-10-CM | POA: Diagnosis not present

## 2017-02-24 MED FILL — LOSARTAN-HCTZ 50-12.5 MG TA: 50-12.5 | 30 days supply | Qty: 30 | Fill #0

## 2017-02-24 MED FILL — ACCU-CHEK FASTCLIX LANCETS: 51 days supply | Qty: 102 | Fill #0

## 2017-02-24 MED FILL — ACCU-CHEK GUIDE TEST STRIP: 50 days supply | Qty: 100 | Fill #0

## 2017-02-24 MED FILL — ULTICARE ALCOHOL SWABS 70 %: 70 | 50 days supply | Qty: 100 | Fill #0

## 2017-03-05 DIAGNOSIS — J069 Acute upper respiratory infection, unspecified: Secondary | ICD-10-CM | POA: Diagnosis not present

## 2017-03-05 DIAGNOSIS — J9801 Acute bronchospasm: Secondary | ICD-10-CM | POA: Diagnosis not present

## 2017-03-09 ENCOUNTER — Ambulatory Visit (INDEPENDENT_AMBULATORY_CARE_PROVIDER_SITE_OTHER): Payer: 59 | Admitting: Physician Assistant

## 2017-03-09 VITALS — BP 118/80 | HR 103 | Temp 98.1°F | Ht 64.0 in | Wt 258.0 lb

## 2017-03-09 DIAGNOSIS — R000 Tachycardia, unspecified: Secondary | ICD-10-CM

## 2017-03-09 DIAGNOSIS — Z6841 Body Mass Index (BMI) 40.0 and over, adult: Secondary | ICD-10-CM

## 2017-03-09 NOTE — Progress Notes (Signed)
Office: [phone]  /  Fax: [phone]   HPI:   Chief Complaint: OBESITY Veronica Russell is here to discuss her progress with her obesity treatment plan. She is on the  keep a food journal with 1200 calories and 85 grams of protein daily and is following her eating plan approximately 70 % of the time. She states she is walking 30 minutes 3 times per week. Veronica Russell states she is still sick with upper respiratory infection and has not been following the meal plan. She will work on Psychologist, counselling and controlling her portions. Her weight is 258 lb (117 kg) today and has had a weight gain of 3 pounds over a period of 2 weeks since her last visit. She has lost 1 lb since starting treatment with [name].  Tachycardia Veronica Russell has tachycardia with a heart rate > 100 on multiple visits. She states her PCP has decreased her propranolol 20 mg BID to once daily and states since the decrease, she has had more palpitations. She denies dizziness, weakness, chest pain or dyspnea.  ALLERGIES: Allergies  Allergen Reactions  . Milk-Related Compounds     Lactose intollerance    MEDICATIONS: Current Outpatient Medications on File Prior to Visit  Medication Sig Dispense Refill  . Alcohol Swabs (ALCOHOL WIPES) 70 % PADS 30 Packages by Does not apply route 2 (two) times daily. 100 each 0  . amLODipine (NORVASC) 5 MG tablet Take 5 mg by mouth daily.    Marland Kitchen atorvastatin (LIPITOR) 20 MG tablet Take 20 mg by mouth daily.    . calcium-vitamin D (OSCAL WITH D) 500-200 MG-UNIT tablet Take 2 tablets by mouth every morning. 60 tablet 0  . Clobetasol Propionate 0.05 % lotion Reported on 07/21/2015    . glucose blood (ACCU-CHEK GUIDE) test strip Use twice daily 100 each 0  . Insulin Pen Needle (BD PEN NEEDLE NANO U/F) 32G X 4 MM MISC 1 Package by Does not apply route daily. 100 each 0  . Lancets (ACCU-CHEK SAFE-T PRO) lancets Use as instructed 100 each 0  . liraglutide (VICTOZA) 18 MG/3ML SOPN Inject 0.25 mLs (1.5 mg total)  into the skin daily. 3 pen 0  . losartan-hydrochlorothiazide (HYZAAR) 50-12.5 MG tablet Take 1 tablet by mouth daily.    . Melatonin 3 MG TABS Take 1 tablet by mouth at bedtime as needed.    . metFORMIN (GLUCOPHAGE-XR) 500 MG 24 hr tablet Take 1,000 mg by mouth at bedtime.     Marland Kitchen omeprazole (PRILOSEC) 20 MG capsule Take 1 capsule (20 mg total) by mouth daily. 30 capsule 1  . propranolol (INDERAL) 20 MG tablet Take 20 mg by mouth daily.      No current facility-administered medications on file prior to visit.     PAST MEDICAL HISTORY: Past Medical History:  Diagnosis Date  . Alopecia   . Asthma   . Back pain   . Diabetes mellitus ([place])   . GERD (gastroesophageal reflux disease)   . Hyperlipidemia   . Hypertension   . Joint pain   . Lactose intolerance   . Obesity   . Raynauds syndrome   . Swelling   . Tremor   . Vitamin D deficiency     PAST SURGICAL HISTORY: Past Surgical History:  Procedure Laterality Date  . ABDOMINAL HYSTERECTOMY    . BREAST BIOPSY    . MYOMECTOMY      SOCIAL HISTORY: Social History   Tobacco Use  . Smoking status: Never Smoker  . Smokeless  tobacco: Never Used  Substance Use Topics  . Alcohol use: No  . Drug use: No    FAMILY HISTORY: Family History  Problem Relation Age of Onset  . Multiple myeloma Mother   . Hypertension Mother   . Hyperlipidemia Mother   . Cancer Mother   . Obesity Mother   . Hypertension Father   . Hyperlipidemia Father   . Cancer Father   . Asthma Other   . Hyperlipidemia Other   . Hypertension Other   . Cancer Other   . COPD Other   . Stroke Other   . Breast cancer Maternal Aunt   . Breast cancer Paternal Aunt   . Breast cancer Maternal Aunt     ROS: Review of Systems  Constitutional: Negative for weight loss.  Respiratory: Negative for shortness of breath.   Cardiovascular: Positive for palpitations. Negative for chest pain.  Neurological: Negative for dizziness and weakness.    PHYSICAL  EXAM: Blood pressure 118/80, pulse (!) 103, temperature 98.1 F (36.7 C), temperature source Oral, height _0  (1.626 m), weight 258 lb (117 kg), last menstrual period 08/31/2008, SpO2 98 %. Body mass index is 44.29 kg/m. Physical Exam  Constitutional: She is oriented to person, place, and time. She appears well-developed and well-nourished.  Cardiovascular: Tachycardia present.  Pulmonary/Chest: Effort normal.  Musculoskeletal: Normal range of motion.  Neurological: She is oriented to person, place, and time.  Skin: Skin is warm and dry.  Psychiatric: She has a normal mood and affect. Her behavior is normal.  Vitals reviewed.   RECENT LABS AND TESTS: BMET    Component Value Date/Time   NA 143 09/29/2016 0937   K 4.1 09/29/2016 0937   CL 102 09/29/2016 0937   CO2 23 09/29/2016 0937   GLUCOSE 114 (H) 09/29/2016 0937   GLUCOSE 99 09/10/2009 0540   BUN 15 09/29/2016 0937   CREATININE 0.74 09/29/2016 0937   CALCIUM 10.2 09/29/2016 0937   GFRNONAA 90 09/29/2016 0937   GFRAA 104 09/29/2016 0937   Lab Results  Component Value Date   HGBA1C 6.6 (H) 09/29/2016   HGBA1C 6.5 (H) 05/09/2016   HGBA1C 6.7 (H) 01/27/2016   HGBA1C 6.9 01/12/2016   HGBA1C 6.6 07/21/2015   Lab Results  Component Value Date   INSULIN 39.0 (H) 09/29/2016   INSULIN 38.5 (H) 05/09/2016   INSULIN 27.3 (H) 01/27/2016   CBC    Component Value Date/Time   WBC 6.8 01/27/2016 1006   WBC 11.1 (H) 09/10/2009 0540   RBC 4.28 01/27/2016 1006   RBC 3.89 09/10/2009 0540   HGB 12.1 01/27/2016 1006   HCT 37.0 01/27/2016 1006   PLT 241 09/10/2009 0540   MCV 86 01/27/2016 1006   MCH 28.3 01/27/2016 1006   MCH 29.0 09/10/2009 0540   MCHC 32.7 01/27/2016 1006   MCHC 33.0 09/10/2009 0540   RDW 13.3 01/27/2016 1006   LYMPHSABS 2.0 01/27/2016 1006   EOSABS 0.2 01/27/2016 1006   BASOSABS 0.0 01/27/2016 1006   Iron/TIBC/Ferritin/ %Sat No results found for: IRON, TIBC, FERRITIN, IRONPCTSAT Lipid Panel      Component Value Date/Time   CHOL 162 09/29/2016 0937   TRIG 140 09/29/2016 0937   HDL 52 09/29/2016 0937   LDLCALC 82 09/29/2016 0937   Hepatic Function Panel     Component Value Date/Time   PROT 7.0 09/29/2016 0937   ALBUMIN 4.3 09/29/2016 0937   AST 15 09/29/2016 0937   ALT 20 09/29/2016 0937   ALKPHOS 87  09/29/2016 0937   BILITOT 0.3 09/29/2016 9480      Component Value Date/Time   TSH 0.436 (L) 09/29/2016 0937   TSH 0.443 (L) 05/09/2016 1005   TSH 0.425 (L) 01/27/2016 1006    ASSESSMENT AND PLAN: Tachycardia  Class 3 severe obesity with serious comorbidity and body mass index (BMI) of 40.0 to 44.9 in adult, unspecified obesity type ([place])  PLAN:  Tachycardia Veronica Russell was advised to follow up with her PCP as planned for adjustment of her medications. Veronica Russell agrees to follow up with our clinic in 2 weeks.  We spent > than 50% of the 15 minute visit on the counseling as documented in the note.  Obesity Veronica Russell is currently in the action stage of change. As such, her goal is to continue with weight loss efforts She has agreed to portion control better and make smarter food choices, such as increase vegetables and decrease simple carbohydrates  Veronica Russell has been instructed to work up to a goal of 150 minutes of combined cardio and strengthening exercise per week for weight loss and overall health benefits. We discussed the following Behavioral Modification Strategies today: increasing lean protein intake and decrease eating out  Veronica Russell has agreed to follow up with our clinic in 2 weeks. She was informed of the importance of frequent follow up visits to maximize her success with intensive lifestyle modifications for her multiple health conditions.  Veronica Russell, am acting as transcriptionist for Lacy Duverney, PA-C I, Lacy Duverney [facility] have reviewed this note and agree with its content

## 2017-03-10 DIAGNOSIS — E119 Type 2 diabetes mellitus without complications: Secondary | ICD-10-CM | POA: Diagnosis not present

## 2017-03-10 DIAGNOSIS — I10 Essential (primary) hypertension: Secondary | ICD-10-CM | POA: Diagnosis not present

## 2017-03-10 DIAGNOSIS — K219 Gastro-esophageal reflux disease without esophagitis: Secondary | ICD-10-CM | POA: Diagnosis not present

## 2017-03-10 DIAGNOSIS — E559 Vitamin D deficiency, unspecified: Secondary | ICD-10-CM | POA: Diagnosis not present

## 2017-03-10 DIAGNOSIS — E782 Mixed hyperlipidemia: Secondary | ICD-10-CM | POA: Diagnosis not present

## 2017-03-10 DIAGNOSIS — Z7984 Long term (current) use of oral hypoglycemic drugs: Secondary | ICD-10-CM | POA: Diagnosis not present

## 2017-03-10 DIAGNOSIS — J9801 Acute bronchospasm: Secondary | ICD-10-CM | POA: Diagnosis not present

## 2017-03-10 DIAGNOSIS — R062 Wheezing: Secondary | ICD-10-CM | POA: Diagnosis not present

## 2017-03-10 MED FILL — MONTELUKAST SOD 10 MG TAB: 10 | 30 days supply | Qty: 30 | Fill #0

## 2017-03-10 MED FILL — OMEPRAZOLE 20 MG CAP: 20 | 30 days supply | Qty: 30 | Fill #0

## 2017-03-10 MED FILL — ATROVENT HFA INHALER: 17 | 25 days supply | Qty: 13 | Fill #0

## 2017-03-10 MED FILL — FLOVENT HFA 110 MCG INHALER: 110 | 30 days supply | Qty: 12 | Fill #0

## 2017-03-23 ENCOUNTER — Ambulatory Visit (INDEPENDENT_AMBULATORY_CARE_PROVIDER_SITE_OTHER): Payer: 59 | Admitting: Physician Assistant

## 2017-03-23 VITALS — BP 123/79 | HR 101 | Temp 97.9°F | Ht 64.0 in | Wt 257.0 lb

## 2017-03-23 DIAGNOSIS — Z794 Long term (current) use of insulin: Secondary | ICD-10-CM | POA: Diagnosis not present

## 2017-03-23 DIAGNOSIS — E119 Type 2 diabetes mellitus without complications: Secondary | ICD-10-CM

## 2017-03-23 DIAGNOSIS — Z6841 Body Mass Index (BMI) 40.0 and over, adult: Secondary | ICD-10-CM

## 2017-03-23 DIAGNOSIS — I10 Essential (primary) hypertension: Secondary | ICD-10-CM | POA: Diagnosis not present

## 2017-03-23 DIAGNOSIS — Z9189 Other specified personal risk factors, not elsewhere classified: Secondary | ICD-10-CM | POA: Diagnosis not present

## 2017-03-23 MED ORDER — LIRAGLUTIDE 18 MG/3ML ~~LOC~~ SOPN: 1.5000 mg | PEN_INJECTOR | Freq: Every day | SUBCUTANEOUS | 0 refills | Status: DC

## 2017-03-23 MED FILL — VICTOZA 18 MG/3 ML INJECT P: 18 | 36 days supply | Qty: 9 | Fill #0

## 2017-03-24 DIAGNOSIS — Z7984 Long term (current) use of oral hypoglycemic drugs: Secondary | ICD-10-CM | POA: Diagnosis not present

## 2017-03-24 DIAGNOSIS — J9801 Acute bronchospasm: Secondary | ICD-10-CM | POA: Diagnosis not present

## 2017-03-24 DIAGNOSIS — E782 Mixed hyperlipidemia: Secondary | ICD-10-CM | POA: Diagnosis not present

## 2017-03-24 DIAGNOSIS — I10 Essential (primary) hypertension: Secondary | ICD-10-CM | POA: Diagnosis not present

## 2017-03-24 DIAGNOSIS — E119 Type 2 diabetes mellitus without complications: Secondary | ICD-10-CM | POA: Diagnosis not present

## 2017-03-24 DIAGNOSIS — R062 Wheezing: Secondary | ICD-10-CM | POA: Diagnosis not present

## 2017-03-24 DIAGNOSIS — E559 Vitamin D deficiency, unspecified: Secondary | ICD-10-CM | POA: Diagnosis not present

## 2017-03-24 DIAGNOSIS — K219 Gastro-esophageal reflux disease without esophagitis: Secondary | ICD-10-CM | POA: Diagnosis not present

## 2017-03-27 MED FILL — LOSARTAN-HCTZ 50-12.5 MG TA: 50-12.5 | 30 days supply | Qty: 30 | Fill #1

## 2017-03-27 NOTE — Progress Notes (Signed)
Office: [phone]  /  Fax: [phone]   HPI:   Chief Complaint: OBESITY Veronica Russell is here to discuss her progress with her obesity treatment plan. She is on the portion control better and make smarter food choices plan  and is following her eating plan approximately 70 % of the time. She states she is exercising 0 minutes 0 times per week. Veronica Russell continues to do well with weight loss. She makes smarter food choices and controls her portions. Veronica Russell has not been journaling her meals. Her weight is 257 lb (116.6 kg) today and has had a weight loss of 1 pound over a period of 2 weeks since her last visit. She has lost 2 lbs since starting treatment with [name].  Diabetes II non insulin without complications Veronica Russell has a diagnosis of diabetes type II. Veronica Russell states fasting BGs range between 80's and 140's and she denies any hypoglycemic episodes. She has been working on intensive lifestyle modifications including diet, exercise, and weight loss to help control her blood glucose levels.  Hypertension Veronica Russell is a 57 y.o. female with hypertension. Veronica Russell denies chest pain or shortness of breath on exertion. She is working weight loss to help control her blood pressure with the goal of decreasing her risk of heart attack and stroke. Veronica Russell blood pressure is stable.  At risk for cardiovascular disease Veronica Russell is at a higher than average risk for cardiovascular disease due to obesity, diabetes and hypertension. She currently denies any chest pain.  ALLERGIES: Allergies  Allergen Reactions  . Milk-Related Compounds     Lactose intollerance    MEDICATIONS: Current Outpatient Medications on File Prior to Visit  Medication Sig Dispense Refill  . Alcohol Swabs (ALCOHOL WIPES) 70 % PADS 30 Packages by Does not apply route 2 (two) times daily. 100 each 0  . amLODipine (NORVASC) 5 MG tablet Take 5 mg by mouth daily.    Marland Kitchen atorvastatin (LIPITOR) 20 MG tablet Take 20 mg by mouth daily.    .  calcium-vitamin D (OSCAL WITH D) 500-200 MG-UNIT tablet Take 2 tablets by mouth every morning. 60 tablet 0  . Clobetasol Propionate 0.05 % lotion Reported on 07/21/2015    . glucose blood (ACCU-CHEK GUIDE) test strip Use twice daily 100 each 0  . Insulin Pen Needle (BD PEN NEEDLE NANO U/F) 32G X 4 MM MISC 1 Package by Does not apply route daily. 100 each 0  . Lancets (ACCU-CHEK SAFE-T PRO) lancets Use as instructed 100 each 0  . losartan-hydrochlorothiazide (HYZAAR) 50-12.5 MG tablet Take 1 tablet by mouth daily.    . Melatonin 3 MG TABS Take 1 tablet by mouth at bedtime as needed.    . metFORMIN (GLUCOPHAGE-XR) 500 MG 24 hr tablet Take 1,000 mg by mouth at bedtime.     . propranolol (INDERAL) 20 MG tablet Take 20 mg by mouth daily.     Marland Kitchen omeprazole (PRILOSEC) 20 MG capsule Take 1 capsule (20 mg total) by mouth daily. 30 capsule 1   No current facility-administered medications on file prior to visit.     PAST MEDICAL HISTORY: Past Medical History:  Diagnosis Date  . Alopecia   . Asthma   . Back pain   . Diabetes mellitus ([place])   . GERD (gastroesophageal reflux disease)   . Hyperlipidemia   . Hypertension   . Joint pain   . Lactose intolerance   . Obesity   . Raynauds syndrome   . Swelling   . Tremor   .  Vitamin D deficiency     PAST SURGICAL HISTORY: Past Surgical History:  Procedure Laterality Date  . ABDOMINAL HYSTERECTOMY    . BREAST BIOPSY    . MYOMECTOMY      SOCIAL HISTORY: Social History   Tobacco Use  . Smoking status: Never Smoker  . Smokeless tobacco: Never Used  Substance Use Topics  . Alcohol use: No  . Drug use: No    FAMILY HISTORY: Family History  Problem Relation Age of Onset  . Multiple myeloma Mother   . Hypertension Mother   . Hyperlipidemia Mother   . Cancer Mother   . Obesity Mother   . Hypertension Father   . Hyperlipidemia Father   . Cancer Father   . Asthma Other   . Hyperlipidemia Other   . Hypertension Other   . Cancer Other     . COPD Other   . Stroke Other   . Breast cancer Maternal Aunt   . Breast cancer Paternal Aunt   . Breast cancer Maternal Aunt     ROS: Review of Systems  Constitutional: Positive for weight loss.  Respiratory: Negative for shortness of breath (on exertion).   Cardiovascular: Negative for chest pain.    PHYSICAL EXAM: Blood pressure 123/79, pulse (!) 101, temperature 97.9 F (36.6 C), temperature source Oral, height '5\' 4"'$  (1.626 m), weight 257 lb (116.6 kg), last menstrual period 08/31/2008, SpO2 97 %. Body mass index is 44.11 kg/m. Physical Exam  Constitutional: She is oriented to person, place, and time. She appears well-developed and well-nourished.  Cardiovascular: Tachycardia present.  Pulmonary/Chest: Effort normal.  Musculoskeletal: Normal range of motion.  Neurological: She is oriented to person, place, and time.  Skin: Skin is warm and dry.  Psychiatric: She has a normal mood and affect. Her behavior is normal.  Vitals reviewed.   RECENT LABS AND TESTS: BMET    Component Value Date/Time   NA 143 09/29/2016 0937   K 4.1 09/29/2016 0937   CL 102 09/29/2016 0937   CO2 23 09/29/2016 0937   GLUCOSE 114 (H) 09/29/2016 0937   GLUCOSE 99 09/10/2009 0540   BUN 15 09/29/2016 0937   CREATININE 0.74 09/29/2016 0937   CALCIUM 10.2 09/29/2016 0937   GFRNONAA 90 09/29/2016 0937   GFRAA 104 09/29/2016 0937   Lab Results  Component Value Date   HGBA1C 6.6 (H) 09/29/2016   HGBA1C 6.5 (H) 05/09/2016   HGBA1C 6.7 (H) 01/27/2016   HGBA1C 6.9 01/12/2016   HGBA1C 6.6 07/21/2015   Lab Results  Component Value Date   INSULIN 39.0 (H) 09/29/2016   INSULIN 38.5 (H) 05/09/2016   INSULIN 27.3 (H) 01/27/2016   CBC    Component Value Date/Time   WBC 6.8 01/27/2016 1006   WBC 11.1 (H) 09/10/2009 0540   RBC 4.28 01/27/2016 1006   RBC 3.89 09/10/2009 0540   HGB 12.1 01/27/2016 1006   HCT 37.0 01/27/2016 1006   PLT 241 09/10/2009 0540   MCV 86 01/27/2016 1006   MCH 28.3  01/27/2016 1006   MCH 29.0 09/10/2009 0540   MCHC 32.7 01/27/2016 1006   MCHC 33.0 09/10/2009 0540   RDW 13.3 01/27/2016 1006   LYMPHSABS 2.0 01/27/2016 1006   EOSABS 0.2 01/27/2016 1006   BASOSABS 0.0 01/27/2016 1006   Iron/TIBC/Ferritin/ %Sat No results found for: IRON, TIBC, FERRITIN, IRONPCTSAT Lipid Panel     Component Value Date/Time   CHOL 162 09/29/2016 0937   TRIG 140 09/29/2016 0937   HDL 52 09/29/2016  7680   SUPJSRP 59 09/29/2016 0937   Hepatic Function Panel     Component Value Date/Time   PROT 7.0 09/29/2016 0937   ALBUMIN 4.3 09/29/2016 0937   AST 15 09/29/2016 0937   ALT 20 09/29/2016 0937   ALKPHOS 87 09/29/2016 0937   BILITOT 0.3 09/29/2016 4585      Component Value Date/Time   TSH 0.436 (L) 09/29/2016 0937   TSH 0.443 (L) 05/09/2016 1005   TSH 0.425 (L) 01/27/2016 1006    ASSESSMENT AND PLAN: Type 2 diabetes mellitus without complication, with long-term current use of insulin (HCC) - Plan: liraglutide (VICTOZA) 18 MG/3ML SOPN  Essential hypertension  At risk for heart disease  Class 3 severe obesity with serious comorbidity and body mass index (BMI) of 40.0 to 44.9 in adult, unspecified obesity type ([place])  PLAN:  Diabetes II non insulin without complications Veronica Russell has been given extensive diabetes education by myself today including ideal fasting and post-prandial blood glucose readings, individual ideal Hgb A1c goals and hypoglycemia prevention. We discussed the importance of good blood sugar control to decrease the likelihood of diabetic complications such as nephropathy, neuropathy, limb loss, blindness, coronary artery disease, and death. We discussed the importance of intensive lifestyle modification including diet, exercise and weight loss as the first line treatment for diabetes. Veronica Russell agrees to continue Victoza #3 pens (patient at 1.5 mg) and will follow up at the agreed upon time.  Hypertension We discussed sodium restriction, working on  healthy weight loss, and a regular exercise program as the means to achieve improved blood pressure control. Veronica Russell agreed with this plan and agreed to follow up as directed. We will continue to monitor her blood pressure as well as her progress with the above lifestyle modifications. She will continue her medications as prescribed and will watch for signs of hypotension as she continues her lifestyle modifications.  Cardiovascular risk counseling Veronica Russell was given extended (15 minutes) coronary artery disease prevention counseling today. She is 57 y.o. female and has risk factors for heart disease including obesity, diabetes and hypertension. We discussed intensive lifestyle modifications today with an emphasis on specific weight loss instructions and strategies. Pt was also informed of the importance of increasing exercise and decreasing saturated fats to help prevent heart disease.  Obesity Veronica Russell is currently in the action stage of change. As such, her goal is to continue with weight loss efforts She has agreed to portion control better and make smarter food choices, such as increase vegetables and decrease simple carbohydrates  Veronica Russell has been instructed to work up to a goal of 150 minutes of combined cardio and strengthening exercise per week for weight loss and overall health benefits. We discussed the following Behavioral Modification Strategies today: increasing lean protein intake and planning for success  Veronica Russell has agreed to follow up with our clinic in 3 to 4 weeks. She was informed of the importance of frequent follow up visits to maximize her success with intensive lifestyle modifications for her multiple health conditions.  Corey Skains, am acting as transcriptionist for [employer], PA-C I, [facility], have reviewed this note and agree with its content.

## 2017-03-30 ENCOUNTER — Ambulatory Visit: Payer: 59 | Admitting: "Endocrinology

## 2017-04-04 ENCOUNTER — Ambulatory Visit: Payer: 59 | Admitting: Internal Medicine

## 2017-04-04 ENCOUNTER — Encounter: Payer: Self-pay | Admitting: Internal Medicine

## 2017-04-04 VITALS — BP 124/84 | HR 106 | Ht 64.0 in | Wt 260.8 lb

## 2017-04-04 DIAGNOSIS — E042 Nontoxic multinodular goiter: Secondary | ICD-10-CM | POA: Insufficient documentation

## 2017-04-04 DIAGNOSIS — E0590 Thyrotoxicosis, unspecified without thyrotoxic crisis or storm: Secondary | ICD-10-CM | POA: Diagnosis not present

## 2017-04-04 DIAGNOSIS — E041 Nontoxic single thyroid nodule: Secondary | ICD-10-CM

## 2017-04-04 LAB — T4, FREE: FREE T4: 0.75 ng/dL (ref 0.60–1.60)

## 2017-04-04 LAB — TSH: TSH: 0.52 u[IU]/mL (ref 0.35–4.50)

## 2017-04-04 LAB — T3, FREE: T3 FREE: 3.7 pg/mL (ref 2.3–4.2)

## 2017-04-04 MED FILL — MONTELUKAST SOD 10 MG TAB: 10 | 30 days supply | Qty: 30 | Fill #1

## 2017-04-04 MED FILL — OMEPRAZOLE 20 MG CAP: 20 | 30 days supply | Qty: 30 | Fill #1

## 2017-04-04 NOTE — Patient Instructions (Addendum)
Please stop at the lab.  Please come back for a follow-up appointment in 6 months.  

## 2017-04-04 NOTE — Progress Notes (Addendum)
Patient ID: Veronica Russell, female   DOB: Oct 30, 1960, 57 y.o.   MRN: [id number]    HPI  Veronica Russell is a 57 y.o.-year-old female, referred by Dr. Radene Ou for evaluation for a low TSH.  Patient was found to have a slightly low TSH since her first visit in the weight loss clinic in 01/2016.  The TFTs were very stable since then, with a TSH just under the lower limit of normal, and free T4 normal  I reviewed pt's thyroid tests: Lab Results  Component Value Date   TSH 0.436 (L) 09/29/2016   TSH 0.443 (L) 05/09/2016   TSH 0.425 (L) 01/27/2016   FREET4 1.17 09/29/2016   FREET4 1.24 05/09/2016   FREET4 1.17 01/27/2016    No results found for: T3FREE   She does mention: - + excessive sweating/heat intolerance (hot flushes) - + tremors (benign - father has it) - no anxiety - + palpitations - no fatigue - no hyperdefecation - no weight loss (started Victoza also for DM - 1 year ago) - + hair loss (not new)  Pt denies: - feeling nodules in neck - hoarseness - dysphagia >> only occasionally with dry mouth - choking - SOB with lying down  Pt does have a FH of thyroid ds. - M aunt.No FH of thyroid cancer. No h/o radiation tx to head or neck.  No seaweed or kelp, no recent contrast studies. + steroid use - Prednisone taper in 12/2016, then started inhaled steroids. No herbal supplements. No Biotin use now.  Pt. also has a history of DM2. Last HbA1c controlled: Lab Results  Component Value Date   HGBA1C 6.6 (H) 09/29/2016   She also has HL, HTN, asthma, vit D def.   ROS: Constitutional: + see HPI Eyes: + Blurry vision, no xerophthalmia ENT: no sore throat, no nodules palpated in throat, no dysphagia/odynophagia, no hoarseness Cardiovascular: no CP/+ SOB/+ palpitations/+ leg swelling Respiratory: + cough/+ SOB wheezing/+  Gastrointestinal: no N/V/D/C Musculoskeletal: no muscle/j + oint aches Skin: no rashes, + hair loss Neurological: + Tremors/numbness/tingling/dizziness,  + headache Psychiatric: no depression/anxiety  Past Medical History:  Diagnosis Date  . Alopecia   . Asthma   . Back pain   . Diabetes mellitus ([place])   . GERD (gastroesophageal reflux disease)   . Hyperlipidemia   . Hypertension   . Joint pain   . Lactose intolerance   . Obesity   . Raynauds syndrome   . Swelling   . Tremor   . Vitamin D deficiency    Past Surgical History:  Procedure Laterality Date  . ABDOMINAL HYSTERECTOMY    . BREAST BIOPSY    . MYOMECTOMY     Social History   Socioeconomic History  . Marital status: Single    Spouse name: Not on file  . Number of children: no  Social Needs  Occupational History  . Occupation: [employer]    Employer: [employer]  Tobacco Use  . Smoking status: Never Smoker  . Smokeless tobacco: Never Used  Substance and Sexual Activity  . Alcohol use:  Wine, 2-3 times a year, 1 drink  . Drug use: No   Current Outpatient Medications on File Prior to Visit  Medication Sig Dispense Refill  . albuterol (VENTOLIN HFA) 108 (90 Base) MCG/ACT inhaler Inhale into the lungs every 6 (six) hours as needed for wheezing or shortness of breath.    . Alcohol Swabs (ALCOHOL WIPES) 70 % PADS 30 Packages by Does not apply  route 2 (two) times daily. 100 each 0  . amLODipine (NORVASC) 5 MG tablet Take 5 mg by mouth daily.    Marland Kitchen atorvastatin (LIPITOR) 20 MG tablet Take 20 mg by mouth daily.    . calcium-vitamin D (OSCAL WITH D) 500-200 MG-UNIT tablet Take 2 tablets by mouth every morning. 60 tablet 0  . Clobetasol Propionate 0.05 % lotion Reported on 07/21/2015    . fluticasone (FLOVENT HFA) 110 MCG/ACT inhaler Inhale into the lungs 2 (two) times daily.    Marland Kitchen glucose blood (ACCU-CHEK GUIDE) test strip Use twice daily 100 each 0  . Insulin Pen Needle (BD PEN NEEDLE NANO U/F) 32G X 4 MM MISC 1 Package by Does not apply route daily. 100 each 0  . ipratropium (ATROVENT HFA) 17 MCG/ACT inhaler Inhale 2 puffs into the lungs 2 (two) times daily.    .  Lancets (ACCU-CHEK SAFE-T PRO) lancets Use as instructed 100 each 0  . liraglutide (VICTOZA) 18 MG/3ML SOPN Inject 0.25 mLs (1.5 mg total) into the skin daily. 3 pen 0  . losartan-hydrochlorothiazide (HYZAAR) 50-12.5 MG tablet Take 1 tablet by mouth daily.    . Melatonin 3 MG TABS Take 1 tablet by mouth at bedtime as needed.    . metFORMIN (GLUCOPHAGE-XR) 500 MG 24 hr tablet Take 1,000 mg by mouth at bedtime.     . montelukast (SINGULAIR) 10 MG tablet Take 10 mg by mouth at bedtime.    . naproxen sodium (ALEVE) 220 MG tablet Take 220 mg by mouth.    . propranolol (INDERAL) 20 MG tablet Take 20 mg by mouth daily.     . Vitamin D, Ergocalciferol, (DRISDOL) 50000 units CAPS capsule Take 50,000 Units by mouth every 7 (seven) days.    Marland Kitchen omeprazole (PRILOSEC) 20 MG capsule Take 1 capsule (20 mg total) by mouth daily. 30 capsule 1   No current facility-administered medications on file prior to visit.    Allergies  Allergen Reactions  . Milk-Related Compounds     Lactose intollerance   Family History  Problem Relation Age of Onset  . Multiple myeloma Mother   . Hypertension Mother   . Hyperlipidemia Mother   . Cancer Mother   . Obesity Mother   . Hypertension Father   . Hyperlipidemia Father   . Cancer Father   . Asthma Other   . Hyperlipidemia Other   . Hypertension Other   . Cancer Other   . COPD Other   . Stroke Other   . Breast cancer Maternal Aunt   . Breast cancer Paternal Aunt   . Breast cancer Maternal Aunt     PE: BP 124/84   Pulse (!) 106   Ht 5' 4" (1.626 m)   Wt 260 lb 12.8 oz (118.3 kg)   LMP 08/31/2008   BMI 44.77 kg/m  Wt Readings from Last 3 Encounters:  04/04/17 260 lb 12.8 oz (118.3 kg)  03/23/17 257 lb (116.6 kg)  03/09/17 258 lb (117 kg)   Constitutional: overweight, in NAD Eyes: PERRLA, EOMI, no exophthalmos, no lid lag, no stare ENT: moist mucous membranes, no thyromegaly, no thyroid bruits, + ~1 cm L thyroid nodule - mobile, no cervical  lymphadenopathy Cardiovascular: tachycardia, RR, No MRG Respiratory: CTA B Gastrointestinal: abdomen soft, NT, ND, BS+ Musculoskeletal: no deformities, strength intact in all 4 Skin: moist, warm, no rashes Neurological: no tremor with outstretched hands, DTR normal in all 4  ASSESSMENT: 1.  Mild subclinical thyrotoxicosis  2.  left thyroid  nodule  PLAN:  1. Patient with a slightly low TSH, with normal free thyroid hormones; with some thyrotoxic sxs: Hot flashes (but menopausal), palpitations, hair loss.  She does not have weight loss, anxiety or hyper defecation. - she does not appear to have exogenous causes for the low TSH.  - We discussed that possible causes of thyrotoxicosis are:  Graves ds   Thyroiditis toxic multinodular goiter/ toxic adenoma (I cannot feel nodules at palpation of her thyroid). - will check the TSH, fT3 and fT4 and also add thyroid stimulating antibodies to screen for Graves' disease.  - If the tests remain abnormal, we may need an uptake and scan to differentiate between the 3 above possible etiologies  - we discussed about possible modalities of treatment for the above conditions, to include methimazole use, radioactive iodine ablation or (last resort) surgery.  - we may need to do thyroid ultrasound depending on the results of the uptake and scan (if a cold nodule is present) - RTC in 6 months, but possibly sooner for repeat labs  2. L thyroid nodule - Felt on palpation  - she has no neck compression symptoms - We will await the results of her TFTs.  If abnormal, she needs an uptake and scan.  If normal, she needs a thyroid ultrasound.  Office Visit on 04/04/2017  Component Date Value Ref Range Status  . TSH 04/04/2017 0.52  0.35 - 4.50 uIU/mL Final  . Free T4 04/04/2017 0.75  0.60 - 1.60 ng/dL Final   Comment: Specimens from patients who are undergoing biotin therapy and /or ingesting biotin supplements may contain high levels of biotin.  The higher  biotin concentration in these specimens interferes with this Free T4 assay.  Specimens that contain high levels  of biotin may cause false high results for this Free T4 assay.  Please interpret results in light of the total clinical presentation of the patient.    . T3, Free 04/04/2017 3.7  2.3 - 4.2 pg/mL Final  . TSI 04/04/2017 <89  <140 % baseline Final   Comment: . Thyroid stimulating immunoglobulins (TSI) can engage the TSH receptors resulting in hyperthyroidism in Graves' disease patients. TSI levels can be useful in monitoring the clinical outcome of Graves' disease as well as assessing the potential for hyperthyroidism from maternal-fetal transfer. TSI results greater than or equal to (>=) 140% of the Reference Control are considered positive. Marland Kitchen NOTE: A serum TSH level greater than 350 micro-International Units/mL can interfere with the TSI bioassay and potentially give false positive results. . Patients who are pregnant and are suspected of having hyperthyroidism should have both TSI and human Chorionic Gonadotropin(hCG) tests measured. A serum hCG level greater than 40,625 mIU/mL can interfere with the TSI bioassay and may give false negative results. In these patients it is recommended that a second TSI be obtained when the hCG concentration falls below 40,625 mIU/mL (usually after approximately 20-weeks gestation)                          . . The analytical performance characteristics of this assay have been determined by [facility], [place], [place]. The modifications have not been cleared or approved by the FDA. This assay has been validated pursuant to the CLIA regulations and is used for clinical purposes. .    Thyroid ultrasound: Details   Reading Physician Reading Date Result Priority  Marybelle Killings, MD 04/14/2017     Narrative  CLINICAL DATA: Palpable abnormality. Thyroid nodule on physical exam.  EXAM: THYROID  ULTRASOUND  TECHNIQUE: Ultrasound examination of the thyroid gland and adjacent soft tissues was performed.  COMPARISON: None.  FINDINGS: Parenchymal Echotexture: Mildly heterogenous  Isthmus: 0.5 cm  Right lobe: 4.5 x 2.1 x 1.3 cm  Left lobe: 4.3 x 1.4 x 1.6 cm  _________________________________________________________  Estimated total number of nodules >/= 1 cm: 1  Number of spongiform nodules >/= 2 cm not described below (TR1): 0  Number of mixed cystic and solid nodules >/= 1.5 cm not described below ([place]): 0  _________________________________________________________  Nodule # 1:  Location: Right; Inferior  Maximum size: 2.9 cm; Other 2 dimensions: 2.0 x 2.1 cm  Composition: solid/almost completely solid (2)  Echogenicity: hypoechoic (2)  Shape: not taller-than-wide (0)  Margins: smooth (0)  Echogenic foci: none (0)  ACR TI-RADS total points: 4.  ACR TI-RADS risk category: TR4 (4-6 points).  ACR TI-RADS recommendations:  **Given size (>/= 1.5 cm) and appearance, fine needle aspiration of this moderately suspicious nodule should be considered based on TI-RADS criteria.  _________________________________________________________  Multiple other smaller nodules measure 0.7 cm or less in size and do not meet criteria for biopsy nor follow-up.  IMPRESSION: Right lower pole nodule 1 meets criteria for fine needle aspiration biopsy.  The above is in keeping with the ACR TI-RADS recommendations - J Am Coll Radiol 2017;14:587-595.   Electronically Signed By: Marybelle Killings M.D. On: 04/14/2017 08:17   Will suggest Bx of the R nodule.  Addendum: Adequacy Reason Satisfactory For Evaluation. Diagnosis THYROID, FINE NEEDLE ASPIRATION, RLP (SPECIMEN 1 OF 1,COLLECTED 05/11/17): CONSISTENT WITH BENIGN FOLLICULAR NODULE ([place] CATEGORY II). Enid Cutter MD Pathologist, Electronic Signature (Case signed 05/15/2017) Specimen Clinical  Information Right Inferior, 2.9cm; Other 2 dimensions: 2.0 x 2.1cm, solid/almost completely solid, hypoechoic, TI-RADS total points - 4, Moderately suspicious nodule Source Thyroid, Fine Needle Aspiration, Right, RLP (Specimen 1 of 1, collected on 05/11/17)  Philemon Kingdom, MD PhD [facility]

## 2017-04-06 ENCOUNTER — Encounter: Payer: Self-pay | Admitting: Internal Medicine

## 2017-04-06 LAB — THYROID STIMULATING IMMUNOGLOBULIN

## 2017-04-13 ENCOUNTER — Ambulatory Visit (INDEPENDENT_AMBULATORY_CARE_PROVIDER_SITE_OTHER): Payer: 59 | Admitting: Physician Assistant

## 2017-04-13 ENCOUNTER — Ambulatory Visit
Admission: RE | Admit: 2017-04-13 | Discharge: 2017-04-13 | Disposition: A | Discharge status: Home / Self Care | Payer: 59 | Source: Ambulatory Visit | Attending: Internal Medicine | Admitting: Internal Medicine

## 2017-04-13 DIAGNOSIS — E041 Nontoxic single thyroid nodule: Secondary | ICD-10-CM | POA: Diagnosis not present

## 2017-04-14 ENCOUNTER — Encounter: Payer: Self-pay | Admitting: Internal Medicine

## 2017-04-17 ENCOUNTER — Ambulatory Visit (INDEPENDENT_AMBULATORY_CARE_PROVIDER_SITE_OTHER): Payer: 59 | Admitting: Physician Assistant

## 2017-04-17 VITALS — BP 118/81 | HR 104 | Temp 98.0°F | Ht 64.0 in | Wt 260.0 lb

## 2017-04-17 DIAGNOSIS — E119 Type 2 diabetes mellitus without complications: Secondary | ICD-10-CM

## 2017-04-17 DIAGNOSIS — Z6841 Body Mass Index (BMI) 40.0 and over, adult: Secondary | ICD-10-CM | POA: Diagnosis not present

## 2017-04-18 MED FILL — PROPRANOLOL 20 MG TABLET: 20 | 90 days supply | Qty: 180 | Fill #1

## 2017-04-18 MED FILL — LISINOPRIL-HCTZ 20-12.5 MG: 20-12.5 | 90 days supply | Qty: 180 | Fill #1

## 2017-04-18 MED FILL — ATORVASTATIN 20 MG TABLET: 20 | 90 days supply | Qty: 90 | Fill #1

## 2017-04-18 MED FILL — AMLODIPINE BESYLATE 5 MG TA: 5 | 90 days supply | Qty: 90 | Fill #1

## 2017-04-18 MED FILL — METFORMIN HCL ER 500 MG TAB: 500 | 90 days supply | Qty: 180 | Fill #1

## 2017-04-18 NOTE — Progress Notes (Signed)
Office: [phone]  /  Fax: [phone]   HPI:   Chief Complaint: OBESITY Veronica Russell is here to discuss her progress with her obesity treatment plan. She is on the portion control better and make smarter food choices, such as increase vegetables and decrease simple carbohydrates  and is following her eating plan approximately 70 % of the time. She states she is exercising 0 minutes 0 times per week. Regis continues to have challenges pre-planning her meals and has been eating out more.  Her weight is 260 lb (117.9 kg) today and has gained 3 pounds since her last visit. She has lost 0 lbs since starting treatment with [name].  Diabetes II [name] has a diagnosis of diabetes type II. [name] states she has been off Victoza, she is metformin and declines any increase in dosage. She denies any hypoglycemic episodes. Last A1c was 6.6 on 09/29/16. She has been working on intensive lifestyle modifications including diet, exercise, and weight loss to help control her blood glucose levels.  ALLERGIES: Allergies  Allergen Reactions  . Milk-Related Compounds     Lactose intollerance    MEDICATIONS: Current Outpatient Medications on File Prior to Visit  Medication Sig Dispense Refill  . albuterol (VENTOLIN HFA) 108 (90 Base) MCG/ACT inhaler Inhale into the lungs every 6 (six) hours as needed for wheezing or shortness of breath.    . Alcohol Swabs (ALCOHOL WIPES) 70 % PADS 30 Packages by Does not apply route 2 (two) times daily. 100 each 0  . amLODipine (NORVASC) 5 MG tablet Take 5 mg by mouth daily.    Marland Kitchen atorvastatin (LIPITOR) 20 MG tablet Take 20 mg by mouth daily.    . calcium-vitamin D (OSCAL WITH D) 500-200 MG-UNIT tablet Take 2 tablets by mouth every morning. 60 tablet 0  . Clobetasol Propionate 0.05 % lotion Reported on 07/21/2015    . fluticasone (FLOVENT HFA) 110 MCG/ACT inhaler Inhale into the lungs 2 (two) times daily.    Marland Kitchen glucose blood (ACCU-CHEK GUIDE) test strip Use twice daily 100 each 0  .  Insulin Pen Needle (BD PEN NEEDLE NANO U/F) 32G X 4 MM MISC 1 Package by Does not apply route daily. 100 each 0  . ipratropium (ATROVENT HFA) 17 MCG/ACT inhaler Inhale 2 puffs into the lungs 2 (two) times daily.    . Lancets (ACCU-CHEK SAFE-T PRO) lancets Use as instructed 100 each 0  . liraglutide (VICTOZA) 18 MG/3ML SOPN Inject 0.25 mLs (1.5 mg total) into the skin daily. 3 pen 0  . losartan-hydrochlorothiazide (HYZAAR) 50-12.5 MG tablet Take 1 tablet by mouth daily.    . Melatonin 3 MG TABS Take 1 tablet by mouth at bedtime as needed.    . metFORMIN (GLUCOPHAGE-XR) 500 MG 24 hr tablet Take 1,000 mg by mouth at bedtime.     . montelukast (SINGULAIR) 10 MG tablet Take 10 mg by mouth at bedtime.    . naproxen sodium (ALEVE) 220 MG tablet Take 220 mg by mouth.    . propranolol (INDERAL) 20 MG tablet Take 20 mg by mouth daily.     . Vitamin D, Ergocalciferol, (DRISDOL) 50000 units CAPS capsule Take 50,000 Units by mouth every 7 (seven) days.    Marland Kitchen omeprazole (PRILOSEC) 20 MG capsule Take 1 capsule (20 mg total) by mouth daily. 30 capsule 1   No current facility-administered medications on file prior to visit.     PAST MEDICAL HISTORY: Past Medical History:  Diagnosis Date  . Alopecia   . Asthma   .  Back pain   . Diabetes mellitus ([place])   . GERD (gastroesophageal reflux disease)   . Hyperlipidemia   . Hypertension   . Joint pain   . Lactose intolerance   . Obesity   . Raynauds syndrome   . Swelling   . Tremor   . Vitamin D deficiency     PAST SURGICAL HISTORY: Past Surgical History:  Procedure Laterality Date  . ABDOMINAL HYSTERECTOMY    . BREAST BIOPSY    . MYOMECTOMY      SOCIAL HISTORY: Social History   Tobacco Use  . Smoking status: Never Smoker  . Smokeless tobacco: Never Used  Substance Use Topics  . Alcohol use: No  . Drug use: No    FAMILY HISTORY: Family History  Problem Relation Age of Onset  . Multiple myeloma Mother   . Hypertension Mother   .  Hyperlipidemia Mother   . Cancer Mother   . Obesity Mother   . Hypertension Father   . Hyperlipidemia Father   . Cancer Father   . Asthma Other   . Hyperlipidemia Other   . Hypertension Other   . Cancer Other   . COPD Other   . Stroke Other   . Breast cancer Maternal Aunt   . Breast cancer Paternal Aunt   . Breast cancer Maternal Aunt     ROS: Review of Systems  Constitutional: Negative for weight loss.  Endo/Heme/Allergies:       Negative hypoglycemia    PHYSICAL EXAM: Blood pressure 118/81, pulse (!) 104, temperature 98 F (36.7 C), temperature source Oral, height _0  (1.626 m), weight 260 lb (117.9 kg), last menstrual period 08/31/2008, SpO2 98 %. Body mass index is 44.63 kg/m. Physical Exam  Constitutional: She is oriented to person, place, and time. She appears well-developed and well-nourished.  Cardiovascular: Tachycardia present.  Pulmonary/Chest: Effort normal.  Musculoskeletal: Normal range of motion.  Neurological: She is oriented to person, place, and time.  Skin: Skin is warm and dry.  Psychiatric: She has a normal mood and affect. Her behavior is normal.  Vitals reviewed.   RECENT LABS AND TESTS: BMET    Component Value Date/Time   NA 143 09/29/2016 0937   K 4.1 09/29/2016 0937   CL 102 09/29/2016 0937   CO2 23 09/29/2016 0937   GLUCOSE 114 (H) 09/29/2016 0937   GLUCOSE 99 09/10/2009 0540   BUN 15 09/29/2016 0937   CREATININE 0.74 09/29/2016 0937   CALCIUM 10.2 09/29/2016 0937   GFRNONAA 90 09/29/2016 0937   GFRAA 104 09/29/2016 0937   Lab Results  Component Value Date   HGBA1C 6.6 (H) 09/29/2016   HGBA1C 6.5 (H) 05/09/2016   HGBA1C 6.7 (H) 01/27/2016   HGBA1C 6.9 01/12/2016   HGBA1C 6.6 07/21/2015   Lab Results  Component Value Date   INSULIN 39.0 (H) 09/29/2016   INSULIN 38.5 (H) 05/09/2016   INSULIN 27.3 (H) 01/27/2016   CBC    Component Value Date/Time   WBC 6.8 01/27/2016 1006   WBC 11.1 (H) 09/10/2009 0540   RBC 4.28  01/27/2016 1006   RBC 3.89 09/10/2009 0540   HGB 12.1 01/27/2016 1006   HCT 37.0 01/27/2016 1006   PLT 241 09/10/2009 0540   MCV 86 01/27/2016 1006   MCH 28.3 01/27/2016 1006   MCH 29.0 09/10/2009 0540   MCHC 32.7 01/27/2016 1006   MCHC 33.0 09/10/2009 0540   RDW 13.3 01/27/2016 1006   LYMPHSABS 2.0 01/27/2016 1006   EOSABS 0.2  01/27/2016 1006   BASOSABS 0.0 01/27/2016 1006   Iron/TIBC/Ferritin/ %Sat No results found for: IRON, TIBC, FERRITIN, IRONPCTSAT Lipid Panel     Component Value Date/Time   CHOL 162 09/29/2016 0937   TRIG 140 09/29/2016 0937   HDL 52 09/29/2016 0937   LDLCALC 82 09/29/2016 0937   Hepatic Function Panel     Component Value Date/Time   PROT 7.0 09/29/2016 0937   ALBUMIN 4.3 09/29/2016 0937   AST 15 09/29/2016 0937   ALT 20 09/29/2016 0937   ALKPHOS 87 09/29/2016 0937   BILITOT 0.3 09/29/2016 0937      Component Value Date/Time   TSH 0.52 04/04/2017 0859   TSH 0.436 (L) 09/29/2016 0937   TSH 0.443 (L) 05/09/2016 1005    ASSESSMENT AND PLAN: Type 2 diabetes mellitus without complication, without long-term current use of insulin (HCC)  Class 3 severe obesity with serious comorbidity and body mass index (BMI) of 40.0 to 44.9 in adult, unspecified obesity type ([place])  PLAN:  Diabetes II Kimm has been given extensive diabetes education by myself today including ideal fasting and post-prandial blood glucose readings, individual ideal Hgb A1c goals and hypoglycemia prevention. We discussed the importance of good blood sugar control to decrease the likelihood of diabetic complications such as nephropathy, neuropathy, limb loss, blindness, coronary artery disease, and death. We discussed the importance of intensive lifestyle modification including diet, exercise and weight loss as the first line treatment for diabetes. Anahla agrees to continue her diabetes medications and she agrees to follow up with our clinic in 3 to 4 weeks.  We spent > than 50% of  the 15 minute visit on the counseling as documented in the note.  Obesity Malon is currently in the action stage of change. As such, her goal is to continue with weight loss efforts She has agreed to portion control better and make smarter food choices, such as increase vegetables and decrease simple carbohydrates  Isyss has been instructed to work up to a goal of 150 minutes of combined cardio and strengthening exercise per week for weight loss and overall health benefits. We discussed the following Behavioral Modification Strategies today: increasing lean protein intake and work on meal planning and easy cooking plans   Keturah has agreed to follow up with our clinic in 3 to 4 weeks. She was informed of the importance of frequent follow up visits to maximize her success with intensive lifestyle modifications for her multiple health conditions.   Wilhemena Durie, am acting as transcriptionist for Lacy Duverney, PA-C I, Lacy Duverney [facility], have reviewed this note and agree with its content

## 2017-04-20 ENCOUNTER — Encounter: Payer: Self-pay | Admitting: Internal Medicine

## 2017-04-20 ENCOUNTER — Other Ambulatory Visit: Payer: Self-pay | Admitting: Internal Medicine

## 2017-04-20 ENCOUNTER — Encounter: Payer: Self-pay | Admitting: Nurse Practitioner

## 2017-04-20 ENCOUNTER — Ambulatory Visit (INDEPENDENT_AMBULATORY_CARE_PROVIDER_SITE_OTHER): Payer: Self-pay | Admitting: Nurse Practitioner

## 2017-04-20 VITALS — BP 122/80 | HR 89 | Temp 98.5°F

## 2017-04-20 DIAGNOSIS — Z760 Encounter for issue of repeat prescription: Secondary | ICD-10-CM

## 2017-04-20 DIAGNOSIS — H6981 Other specified disorders of Eustachian tube, right ear: Secondary | ICD-10-CM

## 2017-04-20 DIAGNOSIS — E042 Nontoxic multinodular goiter: Secondary | ICD-10-CM

## 2017-04-20 MED ORDER — OMEPRAZOLE 20 MG PO CPDR: 20.0000 mg | DELAYED_RELEASE_CAPSULE | Freq: Every day | ORAL | 3 refills | Status: DC

## 2017-04-20 NOTE — Patient Instructions (Signed)
Eustachian Tube Dysfunction The eustachian tube connects the middle ear to the back of the nose. It regulates air pressure in the middle ear by allowing air to move between the ear and nose. It also helps to drain fluid from the middle ear space. When the eustachian tube does not function properly, air pressure, fluid, or both can build up in the middle ear. Eustachian tube dysfunction can affect one or both ears. What are the causes? This condition happens when the eustachian tube becomes blocked or cannot open normally. This may result from:  Ear infections.  Colds and other upper respiratory infections.  Allergies.  Irritation, such as from cigarette smoke or acid from the stomach coming up into the esophagus (gastroesophageal reflux).  Sudden changes in air pressure, such as from descending in an airplane.  Abnormal growths in the nose or throat, such as nasal polyps, tumors, or enlarged tissue at the back of the throat (adenoids).  What increases the risk? This condition may be more likely to develop in people who smoke and people who are overweight. Eustachian tube dysfunction may also be more likely to develop in children, especially children who have:  Certain birth defects of the mouth, such as cleft palate.  Large tonsils and adenoids.  What are the signs or symptoms? Symptoms of this condition may include:  A feeling of fullness in the ear.  Ear pain.  Clicking or popping noises in the ear.  Ringing in the ear.  Hearing loss.  Loss of balance.  Symptoms may get worse when the air pressure around you changes, such as when you travel to an area of high elevation or fly on an airplane. How is this diagnosed? This condition may be diagnosed based on:  Your symptoms.  A physical exam of your ear, nose, and throat.  Tests, such as those that measure: ? The movement of your eardrum (tympanogram). ? Your hearing (audiometry).  How is this treated? Treatment  depends on the cause and severity of your condition. If your symptoms are mild, you may be able to relieve your symptoms by moving air into ("popping") your ears. If you have symptoms of fluid in your ears, treatment may include:  Decongestants.  Antihistamines.  Nasal sprays or ear drops that contain medicines that reduce swelling (steroids).  In some cases, you may need to have a procedure to drain the fluid in your eardrum (myringotomy). In this procedure, a small tube is placed in the eardrum to:  Drain the fluid.  Restore the air in the middle ear space.  Follow these instructions at home:  Take over-the-counter and prescription medicines only as told by your health care provider.  Use techniques to help pop your ears as recommended by your health care provider. These may include: ? Chewing gum. ? Yawning. ? Frequent, forceful swallowing. ? Closing your mouth, holding your nose closed, and gently blowing as if you are trying to blow air out of your nose.  Do not do any of the following until your health care provider approves: ? Travel to high altitudes. ? Fly in airplanes. ? Work in a Pension scheme manager or room. ? Scuba dive.  Keep your ears dry. Dry your ears completely after showering or bathing.  Do not smoke.  Keep all follow-up visits as told by your health care provider. This is important.  Use Flonase 2 sprays in each nostril daily for 10 days or until symptoms improve.  Continue use of Singulair daily.  Take Benadryl  25mg  at bedtime for 5 days. Contact a health care provider if:  Your symptoms do not go away after treatment.  Your symptoms come back after treatment.  You are unable to pop your ears.  You have: ? A fever. ? Pain in your ear. ? Pain in your head or neck. ? Fluid draining from your ear.  Your hearing suddenly changes.  You become very dizzy.  You lose your balance. This information is not intended to replace advice given to you by  your health care provider. Make sure you discuss any questions you have with your health care provider. Document Released: 02/13/2015 Document Revised: 06/25/2015 Document Reviewed: 02/05/2014 Elsevier Interactive Patient Education  [employer].

## 2017-04-20 NOTE — Progress Notes (Signed)
   Subjective:    Patient ID: Veronica Russell, female    DOB: January 20, 1961, 57 y.o.   MRN: [id number]  Ear Fullness   There is pain in the right ear. This is a new problem. The current episode started in the past 7 days. The problem occurs constantly. The problem has been gradually worsening. There has been no fever. The pain is at a severity of 0/10. The patient is experiencing no pain. Associated symptoms include a sore throat (scratchy throat, post-nasal drip). Pertinent negatives include no ear discharge, headaches or rhinorrhea. She has tried nothing for the symptoms. There is no history of a chronic ear infection or hearing loss.   Reviewed past medical history, allergies and medications.    Review of Systems  Constitutional: Negative.   HENT: Positive for postnasal drip and sore throat (scratchy throat, post-nasal drip). Negative for ear discharge, ear pain and rhinorrhea.        Right ear fullness/pressure  Eyes: Negative.   Respiratory: Negative.   Cardiovascular: Negative.   Skin: Negative.   Allergic/Immunologic: Positive for environmental allergies.  Neurological: Negative for headaches.       Objective:   Physical Exam  Constitutional: She is oriented to person, place, and time. She appears well-developed and well-nourished. No distress.  HENT:  Head: Normocephalic and atraumatic.  Right Ear: External ear normal.  Left Ear: External ear normal.  Eyes: Pupils are equal, round, and reactive to light. Conjunctivae and EOM are normal.  Neck: Normal range of motion. Neck supple. No tracheal deviation present. No thyromegaly present.  Cardiovascular: Normal rate, regular rhythm and normal heart sounds.  Pulmonary/Chest: Effort normal and breath sounds normal. No respiratory distress. She has no wheezes.  Abdominal: Soft. Bowel sounds are normal. She exhibits no distension. There is no tenderness.  Neurological: She is alert and oriented to person, place, and time.  Skin: Skin  is warm and dry.  Psychiatric: She has a normal mood and affect. Her behavior is normal. Judgment and thought content normal.          Assessment & Plan:  Right Eustachian Tube Dysfunction 1. Patient instructed to use Flonase 2 sprays in each nostril daily for 10 days or until symptoms improve.  Patient also instructed to use Benadryl at night for 5 nights.  Patient will continue use of Singulair as directed.  Patient does not need refill on Flonase.  Patient will return in 5-7 days if symptoms worsen.   2.  Patient requested refill for Prilosec.  Prilosec 20mg  po daily prescribed, sent to [facility].  Patient verbalizes understanding and had no questions at time of discharge. Meds ordered this encounter  Medications  . omeprazole (PRILOSEC) 20 MG capsule    Sig: Take 1 capsule (20 mg total) by mouth daily.    Dispense:  30 capsule    Refill:  3    Order Specific Question:   Supervising Provider    Answer:   Ricard Dillon [phone]

## 2017-04-21 MED FILL — LOSARTAN-HCTZ 50-12.5 MG TA: 50-12.5 | 30 days supply | Qty: 30 | Fill #2

## 2017-05-08 MED FILL — MONTELUKAST SOD 10 MG TAB: 10 | 30 days supply | Qty: 30 | Fill #2

## 2017-05-10 DIAGNOSIS — E7800 Pure hypercholesterolemia, unspecified: Secondary | ICD-10-CM | POA: Diagnosis not present

## 2017-05-10 DIAGNOSIS — Z7984 Long term (current) use of oral hypoglycemic drugs: Secondary | ICD-10-CM | POA: Diagnosis not present

## 2017-05-10 DIAGNOSIS — E119 Type 2 diabetes mellitus without complications: Secondary | ICD-10-CM | POA: Diagnosis not present

## 2017-05-10 DIAGNOSIS — I10 Essential (primary) hypertension: Secondary | ICD-10-CM | POA: Diagnosis not present

## 2017-05-10 LAB — HEPATIC FUNCTION PANEL
ALT: 34 (ref 7–35)
AST: 19 (ref 13–35)
Alkaline Phosphatase: 79 (ref 25–125)
Bilirubin, Total: 0.7

## 2017-05-10 LAB — LIPID PANEL
Cholesterol: 177 (ref 0–200)
HDL: 49 (ref 35–70)
LDL CALC: 92
LDL/HDL RATIO: 3.6
Triglycerides: 182 — AB (ref 40–160)

## 2017-05-10 LAB — BASIC METABOLIC PANEL
BUN: 14 (ref 4–21)
Creatinine: 0.7 (ref 0.5–1.1)
Glucose: 188
Potassium: 3.8 (ref 3.4–5.3)
SODIUM: 142 (ref 137–147)

## 2017-05-10 LAB — HEMOGLOBIN A1C: Hemoglobin A1C: 7.8

## 2017-05-11 ENCOUNTER — Ambulatory Visit
Admission: RE | Admit: 2017-05-11 | Discharge: 2017-05-11 | Disposition: A | Discharge status: Home / Self Care | Payer: 59 | Source: Ambulatory Visit | Attending: Internal Medicine | Admitting: Internal Medicine

## 2017-05-11 ENCOUNTER — Other Ambulatory Visit (HOSPITAL_COMMUNITY)
Admission: RE | Admit: 2017-05-11 | Discharge: 2017-05-11 | Disposition: A | Discharge status: Home / Self Care | Payer: 59 | Source: Ambulatory Visit | Attending: Radiology | Admitting: Radiology

## 2017-05-11 DIAGNOSIS — D34 Benign neoplasm of thyroid gland: Secondary | ICD-10-CM | POA: Insufficient documentation

## 2017-05-11 DIAGNOSIS — E041 Nontoxic single thyroid nodule: Secondary | ICD-10-CM | POA: Diagnosis not present

## 2017-05-15 ENCOUNTER — Ambulatory Visit (INDEPENDENT_AMBULATORY_CARE_PROVIDER_SITE_OTHER): Payer: 59 | Admitting: Physician Assistant

## 2017-05-15 VITALS — BP 117/79 | HR 96 | Temp 98.1°F | Ht 64.0 in | Wt 262.0 lb

## 2017-05-15 DIAGNOSIS — Z6841 Body Mass Index (BMI) 40.0 and over, adult: Secondary | ICD-10-CM | POA: Diagnosis not present

## 2017-05-15 DIAGNOSIS — E119 Type 2 diabetes mellitus without complications: Secondary | ICD-10-CM

## 2017-05-15 MED FILL — HYDROCHLOROTHIAZIDE 12.5 MG: 12.5 | 90 days supply | Qty: 90 | Fill #0

## 2017-05-15 MED FILL — OMEPRAZOLE 20 MG CAP: 20 | 30 days supply | Qty: 30 | Fill #0

## 2017-05-15 MED FILL — JANUVIA 50 MG TABLET: 50 | 90 days supply | Qty: 90 | Fill #0

## 2017-05-15 MED FILL — LOSARTAN POTASSIUM 50 MG TA: 50 | 90 days supply | Qty: 90 | Fill #0

## 2017-05-16 ENCOUNTER — Encounter: Payer: Self-pay | Admitting: Internal Medicine

## 2017-05-16 NOTE — Progress Notes (Signed)
Office: [phone]  /  Fax: [phone]   HPI:   Chief Complaint: OBESITY Veronica Russell is here to discuss her progress with her obesity treatment plan. She is on the portion control better and make smarter food choices, such as increase vegetables and decrease simple carbohydrates  and is following her eating plan approximately 70 % of the time. She states she is exercising 0 minutes 0 times per week. Veronica Russell is mindful of her eating but is no following the meal plan and continues to have challenges with pre-planning her meals. This visit, she states she is motivated to get back to weight loss.  Her weight is 262 lb (118.8 kg) today and has gained 2 pounds since her last visit. She has lost 0 lbs since starting treatment with [name].  Diabetes II Veronica Russell has a diagnosis of diabetes type II. Veronica Russell states she is not checking BGs at home. She denies hypoglycemia. She is on metformin and Victoza, but has not been taking Victoza. Veronica Russell is awaiting results of thyroid nodule biopsy and due to risk of thyroid cancer with Victoza, she has not been taking Victoza. She has been prescribed metformin and Januvia. Last A1c is 7.8. She has been working on intensive lifestyle modifications including diet, exercise, and weight loss to help control her blood glucose levels.  ALLERGIES: Allergies  Allergen Reactions  . Milk-Related Compounds     Lactose intollerance    MEDICATIONS: Current Outpatient Medications on File Prior to Visit  Medication Sig Dispense Refill  . albuterol (VENTOLIN HFA) 108 (90 Base) MCG/ACT inhaler Inhale into the lungs every 6 (six) hours as needed for wheezing or shortness of breath.    . Alcohol Swabs (ALCOHOL WIPES) 70 % PADS 30 Packages by Does not apply route 2 (two) times daily. 100 each 0  . amLODipine (NORVASC) 5 MG tablet Take 5 mg by mouth daily.    Marland Kitchen atorvastatin (LIPITOR) 20 MG tablet Take 20 mg by mouth daily.    . calcium-vitamin D (OSCAL WITH D) 500-200 MG-UNIT tablet Take 2  tablets by mouth every morning. 60 tablet 0  . Clobetasol Propionate 0.05 % lotion Reported on 07/21/2015    . fluticasone (FLOVENT HFA) 110 MCG/ACT inhaler Inhale into the lungs 2 (two) times daily.    Marland Kitchen glucose blood (ACCU-CHEK GUIDE) test strip Use twice daily 100 each 0  . Insulin Pen Needle (BD PEN NEEDLE NANO U/F) 32G X 4 MM MISC 1 Package by Does not apply route daily. 100 each 0  . ipratropium (ATROVENT HFA) 17 MCG/ACT inhaler Inhale 2 puffs into the lungs 2 (two) times daily.    . Lancets (ACCU-CHEK SAFE-T PRO) lancets Use as instructed 100 each 0  . liraglutide (VICTOZA) 18 MG/3ML SOPN Inject 0.25 mLs (1.5 mg total) into the skin daily. 3 pen 0  . losartan-hydrochlorothiazide (HYZAAR) 50-12.5 MG tablet Take 1 tablet by mouth daily.    . Melatonin 3 MG TABS Take 1 tablet by mouth at bedtime as needed.    . metFORMIN (GLUCOPHAGE-XR) 500 MG 24 hr tablet Take 1,000 mg by mouth at bedtime.     . montelukast (SINGULAIR) 10 MG tablet Take 10 mg by mouth at bedtime.    . naproxen sodium (ALEVE) 220 MG tablet Take 220 mg by mouth.    Marland Kitchen omeprazole (PRILOSEC) 20 MG capsule Take 1 capsule (20 mg total) by mouth daily. 30 capsule 1  . omeprazole (PRILOSEC) 20 MG capsule Take 1 capsule (20 mg total) by mouth daily.  30 capsule 3  . propranolol (INDERAL) 20 MG tablet Take 20 mg by mouth daily.     . Vitamin D, Ergocalciferol, (DRISDOL) 50000 units CAPS capsule Take 50,000 Units by mouth every 7 (seven) days.     No current facility-administered medications on file prior to visit.     PAST MEDICAL HISTORY: Past Medical History:  Diagnosis Date  . Alopecia   . Asthma   . Back pain   . Diabetes mellitus ([place])   . GERD (gastroesophageal reflux disease)   . Hyperlipidemia   . Hypertension   . Joint pain   . Lactose intolerance   . Obesity   . Raynauds syndrome   . Swelling   . Tremor   . Vitamin D deficiency     PAST SURGICAL HISTORY: Past Surgical History:  Procedure Laterality Date    . ABDOMINAL HYSTERECTOMY    . BREAST BIOPSY    . MYOMECTOMY      SOCIAL HISTORY: Social History   Tobacco Use  . Smoking status: Never Smoker  . Smokeless tobacco: Never Used  Substance Use Topics  . Alcohol use: No  . Drug use: No    FAMILY HISTORY: Family History  Problem Relation Age of Onset  . Multiple myeloma Mother   . Hypertension Mother   . Hyperlipidemia Mother   . Cancer Mother   . Obesity Mother   . Hypertension Father   . Hyperlipidemia Father   . Cancer Father   . Asthma Other   . Hyperlipidemia Other   . Hypertension Other   . Cancer Other   . COPD Other   . Stroke Other   . Breast cancer Maternal Aunt   . Breast cancer Paternal Aunt   . Breast cancer Maternal Aunt     ROS: Review of Systems  Constitutional: Negative for weight loss.  Endo/Heme/Allergies:       Negative hypoglycemia    PHYSICAL EXAM: Blood pressure 117/79, pulse 96, temperature 98.1 F (36.7 C), height '5\' 4"'$  (1.626 m), weight 262 lb (118.8 kg), last menstrual period 08/31/2008, SpO2 98 %. Body mass index is 44.97 kg/m. Physical Exam  Constitutional: She is oriented to person, place, and time. She appears well-developed and well-nourished.  Cardiovascular: Normal rate.  Pulmonary/Chest: Effort normal.  Musculoskeletal: Normal range of motion.  Neurological: She is oriented to person, place, and time.  Skin: Skin is warm and dry.  Psychiatric: She has a normal mood and affect. Her behavior is normal.  Vitals reviewed.   RECENT LABS AND TESTS: BMET    Component Value Date/Time   NA 143 09/29/2016 0937   K 4.1 09/29/2016 0937   CL 102 09/29/2016 0937   CO2 23 09/29/2016 0937   GLUCOSE 114 (H) 09/29/2016 0937   GLUCOSE 99 09/10/2009 0540   BUN 15 09/29/2016 0937   CREATININE 0.74 09/29/2016 0937   CALCIUM 10.2 09/29/2016 0937   GFRNONAA 90 09/29/2016 0937   GFRAA 104 09/29/2016 0937   Lab Results  Component Value Date   HGBA1C 6.6 (H) 09/29/2016   HGBA1C 6.5  (H) 05/09/2016   HGBA1C 6.7 (H) 01/27/2016   HGBA1C 6.9 01/12/2016   HGBA1C 6.6 07/21/2015   Lab Results  Component Value Date   INSULIN 39.0 (H) 09/29/2016   INSULIN 38.5 (H) 05/09/2016   INSULIN 27.3 (H) 01/27/2016   CBC    Component Value Date/Time   WBC 6.8 01/27/2016 1006   WBC 11.1 (H) 09/10/2009 0540   RBC 4.28 01/27/2016  1006   RBC 3.89 09/10/2009 0540   HGB 12.1 01/27/2016 1006   HCT 37.0 01/27/2016 1006   PLT 241 09/10/2009 0540   MCV 86 01/27/2016 1006   MCH 28.3 01/27/2016 1006   MCH 29.0 09/10/2009 0540   MCHC 32.7 01/27/2016 1006   MCHC 33.0 09/10/2009 0540   RDW 13.3 01/27/2016 1006   LYMPHSABS 2.0 01/27/2016 1006   EOSABS 0.2 01/27/2016 1006   BASOSABS 0.0 01/27/2016 1006   Iron/TIBC/Ferritin/ %Sat No results found for: IRON, TIBC, FERRITIN, IRONPCTSAT Lipid Panel     Component Value Date/Time   CHOL 162 09/29/2016 0937   TRIG 140 09/29/2016 0937   HDL 52 09/29/2016 0937   LDLCALC 82 09/29/2016 0937   Hepatic Function Panel     Component Value Date/Time   PROT 7.0 09/29/2016 0937   ALBUMIN 4.3 09/29/2016 0937   AST 15 09/29/2016 0937   ALT 20 09/29/2016 0937   ALKPHOS 87 09/29/2016 0937   BILITOT 0.3 09/29/2016 0937      Component Value Date/Time   TSH 0.52 04/04/2017 0859   TSH 0.436 (L) 09/29/2016 0937   TSH 0.443 (L) 05/09/2016 1005    ASSESSMENT AND PLAN: Type 2 diabetes mellitus without complication, without long-term current use of insulin (HCC)  Class 3 severe obesity with serious comorbidity and body mass index (BMI) of 45.0 to 49.9 in adult, unspecified obesity type ([place])  PLAN:  Diabetes II Veronica Russell has been given extensive diabetes education by myself today including ideal fasting and post-prandial blood glucose readings, individual ideal Hgb A1c goals and hypoglycemia prevention. We discussed the importance of good blood sugar control to decrease the likelihood of diabetic complications such as nephropathy, neuropathy, limb  loss, blindness, coronary artery disease, and death. We discussed the importance of intensive lifestyle modification including diet, exercise and weight loss as the first line treatment for diabetes. Veronica Russell agrees to continue metformin and glipizide as prescribed and she agrees to follow up with our clinic in 2 weeks.  We spent > than 50% of the 15 minute visit on the counseling as documented in the note.  Obesity Veronica Russell is currently in the action stage of change. As such, her goal is to continue with weight loss efforts She has agreed to portion control better and make smarter food choices, such as increase vegetables and decrease simple carbohydrates  Veronica Russell has been instructed to work up to a goal of 150 minutes of combined cardio and strengthening exercise per week for weight loss and overall health benefits. We discussed the following Behavioral Modification Strategies today: increasing lean protein intake and work on meal planning and easy cooking plans   Veronica Russell has agreed to follow up with our clinic in 2 weeks. She was informed of the importance of frequent follow up visits to maximize her success with intensive lifestyle modifications for her multiple health conditions.   Veronica Russell, am acting as transcriptionist for Lacy Duverney, PA-C I, Lacy Duverney [facility], have reviewed this note and agree with its contents

## 2017-05-30 ENCOUNTER — Ambulatory Visit (INDEPENDENT_AMBULATORY_CARE_PROVIDER_SITE_OTHER): Payer: 59 | Admitting: Physician Assistant

## 2017-05-30 VITALS — BP 117/79 | HR 105 | Temp 97.9°F | Ht 64.0 in | Wt 263.0 lb

## 2017-05-30 DIAGNOSIS — E119 Type 2 diabetes mellitus without complications: Secondary | ICD-10-CM | POA: Diagnosis not present

## 2017-05-30 DIAGNOSIS — E559 Vitamin D deficiency, unspecified: Secondary | ICD-10-CM

## 2017-05-30 DIAGNOSIS — Z6841 Body Mass Index (BMI) 40.0 and over, adult: Secondary | ICD-10-CM | POA: Diagnosis not present

## 2017-05-30 DIAGNOSIS — Z9189 Other specified personal risk factors, not elsewhere classified: Secondary | ICD-10-CM

## 2017-05-30 MED ORDER — GLUCOSE BLOOD VI STRP: ORAL_STRIP | 0 refills | Status: DC

## 2017-05-30 MED ORDER — LIRAGLUTIDE 18 MG/3ML ~~LOC~~ SOPN: 1.5000 mg | PEN_INJECTOR | Freq: Every day | SUBCUTANEOUS | 0 refills | Status: DC

## 2017-05-30 MED ORDER — ALCOHOL WIPES 70 % PADS: 30.0000 | MEDICATED_PAD | Freq: Two times a day (BID) | 0 refills | Status: AC

## 2017-05-30 MED ORDER — INSULIN PEN NEEDLE 32G X 4 MM MISC: 1.0000 | Freq: Every day | 0 refills | Status: DC

## 2017-05-30 MED ORDER — ACCU-CHEK SAFE-T PRO LANCETS MISC: 0 refills | Status: DC

## 2017-05-31 MED FILL — VICTOZA 18 MG/3 ML INJECT P: 18 | 36 days supply | Qty: 9 | Fill #0

## 2017-05-31 MED FILL — ACCU-CHEK FASTCLIX LANCETS: 50 days supply | Qty: 102 | Fill #0

## 2017-05-31 MED FILL — ULTICARE ALCOHOL SWABS 70 %: 70 | 50 days supply | Qty: 100 | Fill #0

## 2017-05-31 MED FILL — UNIFINE PENTIPS 32GX5/32": 32G X 4 MM | 90 days supply | Qty: 100 | Fill #0

## 2017-05-31 MED FILL — ACCU-CHEK GUIDE STRP: 50 days supply | Qty: 100 | Fill #0

## 2017-05-31 MED FILL — UNIFINE PENTIPS 32GX5/32: 32G X 4 MM | 90 days supply | Qty: 100 | Fill #0

## 2017-06-01 MED FILL — MONTELUKAST SOD 10 MG TAB: 10 | 30 days supply | Qty: 30 | Fill #3

## 2017-06-01 NOTE — Progress Notes (Signed)
Office: [phone]  /  Fax: [phone]   HPI:   Chief Complaint: OBESITY Veronica Russell is here to discuss her progress with her obesity treatment plan. She is on the portion control better and make smarter food choices plan and is following her eating plan approximately 60 % of the time. She states she is exercising 0 minutes 0 times per week. Veronica Russell is encouraged to follow the structured meal plan, as she has not been on it. She continues to have challenges with eating out. Her weight is 263 lb (119.3 kg) today and has had a weight gain of 1 pound over a period of 2 weeks since her last visit. She has gained 4 lbs since starting treatment with [name].  Diabetes II non insulin without complications Veronica Russell has a diagnosis of diabetes type II. Layann states fasting BGs range between 140s and 150's. Per patient, her PCP has recently adjusted her diabetes medications. She is currently on metforin 1,04m BID, januvia and victoza 0.6 mg. VFelicitydenies any hypoglycemic episodes. She has been working on intensive lifestyle modifications including diet, exercise, and weight loss to help control her blood glucose levels.  Vitamin D deficiency VJolettehas a diagnosis of vitamin D deficiency. She is currently taking vit D and denies nausea, vomiting or muscle weakness.  At risk for osteopenia and osteoporosis VLyllianais at higher risk of osteopenia and osteoporosis due to vitamin D deficiency.   ALLERGIES: Allergies  Allergen Reactions  . Milk-Related Compounds     Lactose intollerance    MEDICATIONS: Current Outpatient Medications on File Prior to Visit  Medication Sig Dispense Refill  . albuterol (VENTOLIN HFA) 108 (90 Base) MCG/ACT inhaler Inhale into the lungs every 6 (six) hours as needed for wheezing or shortness of breath.    .Marland KitchenamLODipine (NORVASC) 5 MG tablet Take 5 mg by mouth daily.    .Marland Kitchenatorvastatin (LIPITOR) 20 MG tablet Take 20 mg by mouth daily.    . calcium-vitamin D (OSCAL WITH D) 500-200  MG-UNIT tablet Take 2 tablets by mouth every morning. 60 tablet 0  . Clobetasol Propionate 0.05 % lotion Reported on 07/21/2015    . fluticasone (FLOVENT HFA) 110 MCG/ACT inhaler Inhale into the lungs 2 (two) times daily.    .Marland Kitchenipratropium (ATROVENT HFA) 17 MCG/ACT inhaler Inhale 2 puffs into the lungs 2 (two) times daily.    .Marland Kitchenlosartan-hydrochlorothiazide (HYZAAR) 50-12.5 MG tablet Take 1 tablet by mouth daily.    . Melatonin 3 MG TABS Take 1 tablet by mouth at bedtime as needed.    . metFORMIN (GLUCOPHAGE-XR) 500 MG 24 hr tablet Take 1,000 mg by mouth at bedtime.     . montelukast (SINGULAIR) 10 MG tablet Take 10 mg by mouth at bedtime.    . naproxen sodium (ALEVE) 220 MG tablet Take 220 mg by mouth.    . propranolol (INDERAL) 20 MG tablet Take 20 mg by mouth daily.     . Vitamin D, Ergocalciferol, (DRISDOL) 50000 units CAPS capsule Take 50,000 Units by mouth every 7 (seven) days.    .Marland Kitchenomeprazole (PRILOSEC) 20 MG capsule Take 1 capsule (20 mg total) by mouth daily. 30 capsule 1  . omeprazole (PRILOSEC) 20 MG capsule Take 1 capsule (20 mg total) by mouth daily. 30 capsule 3   No current facility-administered medications on file prior to visit.     PAST MEDICAL HISTORY: Past Medical History:  Diagnosis Date  . Alopecia   . Asthma   . Back pain   .  Diabetes mellitus ([place])   . GERD (gastroesophageal reflux disease)   . Hyperlipidemia   . Hypertension   . Joint pain   . Lactose intolerance   . Obesity   . Raynauds syndrome   . Swelling   . Tremor   . Vitamin D deficiency     PAST SURGICAL HISTORY: Past Surgical History:  Procedure Laterality Date  . ABDOMINAL HYSTERECTOMY    . BREAST BIOPSY    . MYOMECTOMY      SOCIAL HISTORY: Social History   Tobacco Use  . Smoking status: Never Smoker  . Smokeless tobacco: Never Used  Substance Use Topics  . Alcohol use: No  . Drug use: No    FAMILY HISTORY: Family History  Problem Relation Age of Onset  . Multiple myeloma  Mother   . Hypertension Mother   . Hyperlipidemia Mother   . Cancer Mother   . Obesity Mother   . Hypertension Father   . Hyperlipidemia Father   . Cancer Father   . Asthma Other   . Hyperlipidemia Other   . Hypertension Other   . Cancer Other   . COPD Other   . Stroke Other   . Breast cancer Maternal Aunt   . Breast cancer Paternal Aunt   . Breast cancer Maternal Aunt     ROS: Review of Systems  Constitutional: Negative for weight loss.  Gastrointestinal: Negative for nausea and vomiting.  Musculoskeletal:       Negative for muscle weakness  Endo/Heme/Allergies:       Negative for hypoglycemia    PHYSICAL EXAM: Blood pressure 117/79, pulse (!) 105, temperature 97.9 F (36.6 C), temperature source Oral, height _0  (1.626 m), weight 263 lb (119.3 kg), last menstrual period 08/31/2008, SpO2 98 %. Body mass index is 45.14 kg/m. Physical Exam  Constitutional: She is oriented to person, place, and time. She appears well-developed and well-nourished.  Cardiovascular: Tachycardia present.  Pulmonary/Chest: Effort normal.  Musculoskeletal: Normal range of motion.  Neurological: She is oriented to person, place, and time.  Skin: Skin is warm and dry.  Psychiatric: She has a normal mood and affect. Her behavior is normal.  Vitals reviewed.   RECENT LABS AND TESTS: BMET    Component Value Date/Time   NA 143 09/29/2016 0937   K 4.1 09/29/2016 0937   CL 102 09/29/2016 0937   CO2 23 09/29/2016 0937   GLUCOSE 114 (H) 09/29/2016 0937   GLUCOSE 99 09/10/2009 0540   BUN 15 09/29/2016 0937   CREATININE 0.74 09/29/2016 0937   CALCIUM 10.2 09/29/2016 0937   GFRNONAA 90 09/29/2016 0937   GFRAA 104 09/29/2016 0937   Lab Results  Component Value Date   HGBA1C 6.6 (H) 09/29/2016   HGBA1C 6.5 (H) 05/09/2016   HGBA1C 6.7 (H) 01/27/2016   HGBA1C 6.9 01/12/2016   HGBA1C 6.6 07/21/2015   Lab Results  Component Value Date   INSULIN 39.0 (H) 09/29/2016   INSULIN 38.5 (H)  05/09/2016   INSULIN 27.3 (H) 01/27/2016   CBC    Component Value Date/Time   WBC 6.8 01/27/2016 1006   WBC 11.1 (H) 09/10/2009 0540   RBC 4.28 01/27/2016 1006   RBC 3.89 09/10/2009 0540   HGB 12.1 01/27/2016 1006   HCT 37.0 01/27/2016 1006   PLT 241 09/10/2009 0540   MCV 86 01/27/2016 1006   MCH 28.3 01/27/2016 1006   MCH 29.0 09/10/2009 0540   MCHC 32.7 01/27/2016 1006   MCHC 33.0 09/10/2009 0540  RDW 13.3 01/27/2016 1006   LYMPHSABS 2.0 01/27/2016 1006   EOSABS 0.2 01/27/2016 1006   BASOSABS 0.0 01/27/2016 1006   Iron/TIBC/Ferritin/ %Sat No results found for: IRON, TIBC, FERRITIN, IRONPCTSAT Lipid Panel     Component Value Date/Time   CHOL 162 09/29/2016 0937   TRIG 140 09/29/2016 0937   HDL 52 09/29/2016 0937   LDLCALC 82 09/29/2016 0937   Hepatic Function Panel     Component Value Date/Time   PROT 7.0 09/29/2016 0937   ALBUMIN 4.3 09/29/2016 0937   AST 15 09/29/2016 0937   ALT 20 09/29/2016 0937   ALKPHOS 87 09/29/2016 0937   BILITOT 0.3 09/29/2016 0937      Component Value Date/Time   TSH 0.52 04/04/2017 0859   TSH 0.436 (L) 09/29/2016 0937   TSH 0.443 (L) 05/09/2016 1005   Results for DALYN, KJOS (MRN [id number]) as of 06/01/2017 10:24  Ref. Range 09/29/2016 09:37  Vitamin D, 25-Hydroxy Latest Ref Range: 30.0 - 100.0 ng/mL 53.2   ASSESSMENT AND PLAN: Type 2 diabetes mellitus without complication, without long-term current use of insulin (HCC) - Plan: Alcohol Swabs (ALCOHOL WIPES) 70 % PADS, glucose blood (ACCU-CHEK GUIDE) test strip, liraglutide (VICTOZA) 18 MG/3ML SOPN, Insulin Pen Needle (BD PEN NEEDLE NANO U/F) 32G X 4 MM MISC, Lancets (ACCU-CHEK SAFE-T PRO) lancets  Vitamin D deficiency  At risk for osteoporosis  Class 3 severe obesity with serious comorbidity and body mass index (BMI) of 45.0 to 49.9 in adult, unspecified obesity type (HCC)  PLAN:  Diabetes II non insulin without complications Kennya has been given extensive diabetes  education by myself today including ideal fasting and post-prandial blood glucose readings, individual ideal Hgb A1c goals and hypoglycemia prevention. We discussed the importance of good blood sugar control to decrease the likelihood of diabetic complications such as nephropathy, neuropathy, limb loss, blindness, coronary artery disease, and death. We discussed the importance of intensive lifestyle modification including diet, exercise and weight loss as the first line treatment for diabetes. Jaycey agrees to continue her diabetes medications and we will refill victoza #3 pens, pen needles, test strips, lancets, alcohol wipes and follow up at the agreed upon time.  Vitamin D Deficiency Alexander was informed that low vitamin D levels contributes to fatigue and are associated with obesity, breast, and colon cancer. She agrees to continue to take prescription Vit D _0 ,000 IU every week and will follow up for routine testing of vitamin D, at least 2-3 times per year. She was informed of the risk of over-replacement of vitamin D and agrees to not increase her dose unless she discusses this with [name] first.  At risk for osteopenia and osteoporosis Linn is at risk for osteopenia and osteoporosis due to her vitamin D deficiency. She was encouraged to take her vitamin D and follow her higher calcium diet and increase strengthening exercise to help strengthen her bones and decrease her risk of osteopenia and osteoporosis.  Obesity Sadira is currently in the action stage of change. As such, her goal is to continue with weight loss efforts She has agreed to portion control better and make smarter food choices, such as increase vegetables and decrease simple carbohydrates  Renesmee has been instructed to work up to a goal of 150 minutes of combined cardio and strengthening exercise per week for weight loss and overall health benefits. We discussed the following Behavioral Modification Strategies today: decreasing simple  carbohydrates and keep a strict food journal  Allen has agreed to follow  up with our clinic in 2 weeks. She was informed of the importance of frequent follow up visits to maximize her success with intensive lifestyle modifications for her multiple health conditions.  Corey Skains, am acting as transcriptionist for [employer], PA-C I, Lacy Duverney [facility], have reviewed this note and agree with its content

## 2017-06-12 ENCOUNTER — Ambulatory Visit (INDEPENDENT_AMBULATORY_CARE_PROVIDER_SITE_OTHER): Payer: Self-pay | Admitting: Nurse Practitioner

## 2017-06-12 ENCOUNTER — Encounter: Payer: Self-pay | Admitting: Nurse Practitioner

## 2017-06-12 VITALS — BP 118/82 | HR 110 | Temp 98.5°F

## 2017-06-12 DIAGNOSIS — J029 Acute pharyngitis, unspecified: Secondary | ICD-10-CM

## 2017-06-12 MED ORDER — AMOXICILLIN 875 MG PO TABS
875.0000 mg | ORAL_TABLET | Freq: Two times a day (BID) | ORAL | 0 refills | Status: AC
Start: 2017-06-12 — End: 2017-06-22

## 2017-06-12 MED FILL — AMOXICILLIN 875 MG TABLET: 875 | 10 days supply | Qty: 20 | Fill #0

## 2017-06-12 NOTE — Patient Instructions (Signed)

## 2017-06-12 NOTE — Progress Notes (Signed)
   Subjective:    Patient ID: Veronica Russell, female    DOB: [birth year], 57 y.o.   MRN: [id number]  The patient is a 57 year old female who presents today for complaints of sore throat.  Patient states her symptoms started about 2 days ago.  Patient denies fever, nasal congestion, or coryza.  Patient does have complaints of coughing, and bilateral ear pain and pressure.  Patient also some complains of some mild sinus tenderness.  Patient states she has been taking over-the-counter cough and cold medicine with little to no relief.  Patient is drinking, but states her appetite is decreased.  Patient does have a history of diabetes with her last A1c of 7.8.  Patient states several of her coworkers have been ill, but unable to determine if she has been exposed to strep or not.  Reviewed patient's past medical history, current medications, and allergies.   Review of Systems  Constitutional: Positive for appetite change and fatigue. Negative for activity change and fever.  HENT: Positive for postnasal drip, sinus pressure and sore throat. Negative for congestion.        Bilateral ear pain/pressure, worse on right side.  Mild maxillary sinus pressure/tenderness  Eyes: Negative.   Respiratory: Positive for cough and wheezing.        Loose cough, non productive.  Cardiovascular: Negative.   Gastrointestinal: Negative.   Allergic/Immunologic: Positive for environmental allergies.  Neurological: Positive for headaches.      Objective:   Physical Exam  Constitutional: She is oriented to person, place, and time. She appears well-developed and well-nourished. No distress.  HENT:  Head: Normocephalic and atraumatic.  Right Ear: Hearing normal. No swelling. A middle ear effusion is present.  Left Ear: Hearing and tympanic membrane normal. No swelling.  No middle ear effusion.  Mouth/Throat: Uvula is midline and mucous membranes are normal. Posterior oropharyngeal edema and posterior oropharyngeal  erythema present. Tonsils are 2+ on the right. Tonsils are 1+ on the left. No tonsillar exudate.  Right ear with mucoid middle ear fluid.   Eyes: Pupils are equal, round, and reactive to light. EOM are normal.  Neck:  Moderated bilateral cervical lymphadenopathy  Cardiovascular: Normal rate, regular rhythm and normal heart sounds.  Pulmonary/Chest: Effort normal.  Rhonchi to posterior LUL, clears with coughing  Abdominal: Soft. Bowel sounds are normal. She exhibits no distension. There is no tenderness.  Lymphadenopathy:    She has cervical adenopathy.  Neurological: She is alert and oriented to person, place, and time.  Skin: Skin is warm and dry.  Psychiatric: She has a normal mood and affect. Her behavior is normal.  Vitals reviewed.     Assessment & Plan:  Pharyngitis 1.  Amoxil 875 mg twice daily for 10 days.  Discussed with patient due to other comorbidities will treat prophylactically. 2.  Albuterol inhaler 2 puffs every 6 hours as needed for cough.  Patient states she does not need a refill at this time. 3.  Warm salt water gargles for throat pain or discomfort.  May also use honey or honey with lemon as can tolerate with diabetes for throat pain or discomfort. 4.  Tylenol for pain fever or general discomfort. 5.  Follow-up if symptoms do not improve. 6.  Follow-up in the ER if difficulty breathing shortness of breath or other concerns. 7.  Patient education provided. 8.  Patient verbalizes understanding and has no questions at time of discharge.

## 2017-06-15 ENCOUNTER — Ambulatory Visit (INDEPENDENT_AMBULATORY_CARE_PROVIDER_SITE_OTHER): Payer: 59 | Admitting: Physician Assistant

## 2017-06-15 VITALS — BP 122/81 | HR 100 | Temp 98.1°F | Ht 64.0 in | Wt 265.0 lb

## 2017-06-15 DIAGNOSIS — Z6841 Body Mass Index (BMI) 40.0 and over, adult: Secondary | ICD-10-CM

## 2017-06-15 DIAGNOSIS — E1165 Type 2 diabetes mellitus with hyperglycemia: Secondary | ICD-10-CM | POA: Diagnosis not present

## 2017-06-15 NOTE — Progress Notes (Signed)
Office: [phone]  /  Fax: [phone]   HPI:   Chief Complaint: OBESITY Veronica Russell is here to discuss her progress with her obesity treatment plan. She is on the portion control better and make smarter food choices, such as increase vegetables and decrease simple carbohydrates and is following her eating plan approximately 70 % of the time. She states she is exercising 0 minutes 0 times per week. Veronica Russell continues to have challenges with pre-planning her meals and eats out more. States she is making better food choices and controls her portions.  Her weight is 265 lb (120.2 kg) today and has gained 2 pounds since her last visit. She has lost 0 lbs since starting treatment with [name].  Diabetes II Veronica Russell has a diagnosis of diabetes type II. [name]'s last A1c with primary car physician was 7.8 per patient (not on records). Record from primary care physician not available on Epic, we will request them. Fasting BGs range between 130's and 190's. She denies hypoglycemia. She is on Victoza, Januvia, and metformin. She has been working on intensive lifestyle modifications including diet, exercise, and weight loss to help control her blood glucose levels.  ALLERGIES: Allergies  Allergen Reactions  . Milk-Related Compounds     Lactose intollerance    MEDICATIONS: Current Outpatient Medications on File Prior to Visit  Medication Sig Dispense Refill  . albuterol (VENTOLIN HFA) 108 (90 Base) MCG/ACT inhaler Inhale into the lungs every 6 (six) hours as needed for wheezing or shortness of breath.    . Alcohol Swabs (ALCOHOL WIPES) 70 % PADS 30 Packages by Does not apply route 2 (two) times daily. 100 each 0  . amLODipine (NORVASC) 5 MG tablet Take 5 mg by mouth daily.    Marland Kitchen amoxicillin (AMOXIL) 875 MG tablet Take 1 tablet (875 mg total) by mouth 2 (two) times daily for 10 days. 20 tablet 0  . atorvastatin (LIPITOR) 20 MG tablet Take 20 mg by mouth daily.    . calcium-vitamin D (OSCAL WITH D) 500-200  MG-UNIT tablet Take 2 tablets by mouth every morning. 60 tablet 0  . Clobetasol Propionate 0.05 % lotion Reported on 07/21/2015    . fluticasone (FLOVENT HFA) 110 MCG/ACT inhaler Inhale into the lungs 2 (two) times daily.    Marland Kitchen glucose blood (ACCU-CHEK GUIDE) test strip Use twice daily 100 each 0  . Insulin Pen Needle (BD PEN NEEDLE NANO U/F) 32G X 4 MM MISC 1 Package by Does not apply route daily. 100 each 0  . ipratropium (ATROVENT HFA) 17 MCG/ACT inhaler Inhale 2 puffs into the lungs 2 (two) times daily.    . Lancets (ACCU-CHEK SAFE-T PRO) lancets Use as instructed 100 each 0  . liraglutide (VICTOZA) 18 MG/3ML SOPN Inject 0.25 mLs (1.5 mg total) into the skin daily. 3 pen 0  . losartan-hydrochlorothiazide (HYZAAR) 50-12.5 MG tablet Take 1 tablet by mouth daily.    . Melatonin 3 MG TABS Take 1 tablet by mouth at bedtime as needed.    . metFORMIN (GLUCOPHAGE-XR) 500 MG 24 hr tablet Take 1,000 mg by mouth at bedtime.     . montelukast (SINGULAIR) 10 MG tablet Take 10 mg by mouth at bedtime.    . naproxen sodium (ALEVE) 220 MG tablet Take 220 mg by mouth.    . propranolol (INDERAL) 20 MG tablet Take 20 mg by mouth daily.     . sitaGLIPtin (JANUVIA) 50 MG tablet Take 50 mg by mouth daily.    . Vitamin D, Ergocalciferol, (  DRISDOL) 50000 units CAPS capsule Take 50,000 Units by mouth every 7 (seven) days.    Marland Kitchen omeprazole (PRILOSEC) 20 MG capsule Take 1 capsule (20 mg total) by mouth daily. 30 capsule 1  . omeprazole (PRILOSEC) 20 MG capsule Take 1 capsule (20 mg total) by mouth daily. 30 capsule 3   No current facility-administered medications on file prior to visit.     PAST MEDICAL HISTORY: Past Medical History:  Diagnosis Date  . Alopecia   . Asthma   . Back pain   . Diabetes mellitus ([place])   . GERD (gastroesophageal reflux disease)   . Hyperlipidemia   . Hypertension   . Joint pain   . Lactose intolerance   . Obesity   . Raynauds syndrome   . Swelling   . Tremor   . Vitamin D  deficiency     PAST SURGICAL HISTORY: Past Surgical History:  Procedure Laterality Date  . ABDOMINAL HYSTERECTOMY    . BREAST BIOPSY    . MYOMECTOMY      SOCIAL HISTORY: Social History   Tobacco Use  . Smoking status: Never Smoker  . Smokeless tobacco: Never Used  Substance Use Topics  . Alcohol use: No  . Drug use: No    FAMILY HISTORY: Family History  Problem Relation Age of Onset  . Multiple myeloma Mother   . Hypertension Mother   . Hyperlipidemia Mother   . Cancer Mother   . Obesity Mother   . Hypertension Father   . Hyperlipidemia Father   . Cancer Father   . Asthma Other   . Hyperlipidemia Other   . Hypertension Other   . Cancer Other   . COPD Other   . Stroke Other   . Breast cancer Maternal Aunt   . Breast cancer Paternal Aunt   . Breast cancer Maternal Aunt     ROS: Review of Systems  Constitutional: Negative for weight loss.  Endo/Heme/Allergies:       Negative hypoglycemia    PHYSICAL EXAM: Blood pressure 122/81, pulse 100, temperature 98.1 F (36.7 C), temperature source Oral, height _0  (1.626 m), weight 265 lb (120.2 kg), last menstrual period 08/31/2008, SpO2 96 %. Body mass index is 45.49 kg/m. Physical Exam  Constitutional: She is oriented to person, place, and time. She appears well-developed and well-nourished.  Cardiovascular: Normal rate.  Pulmonary/Chest: Effort normal.  Musculoskeletal: Normal range of motion.  Neurological: She is oriented to person, place, and time.  Skin: Skin is warm and dry.  Psychiatric: She has a normal mood and affect. Her behavior is normal.  Vitals reviewed.   RECENT LABS AND TESTS: BMET    Component Value Date/Time   NA 143 09/29/2016 0937   K 4.1 09/29/2016 0937   CL 102 09/29/2016 0937   CO2 23 09/29/2016 0937   GLUCOSE 114 (H) 09/29/2016 0937   GLUCOSE 99 09/10/2009 0540   BUN 15 09/29/2016 0937   CREATININE 0.74 09/29/2016 0937   CALCIUM 10.2 09/29/2016 0937   GFRNONAA 90  09/29/2016 0937   GFRAA 104 09/29/2016 0937   Lab Results  Component Value Date   HGBA1C 6.6 (H) 09/29/2016   HGBA1C 6.5 (H) 05/09/2016   HGBA1C 6.7 (H) 01/27/2016   HGBA1C 6.9 01/12/2016   HGBA1C 6.6 07/21/2015   Lab Results  Component Value Date   INSULIN 39.0 (H) 09/29/2016   INSULIN 38.5 (H) 05/09/2016   INSULIN 27.3 (H) 01/27/2016   CBC    Component Value Date/Time  WBC 6.8 01/27/2016 1006   WBC 11.1 (H) 09/10/2009 0540   RBC 4.28 01/27/2016 1006   RBC 3.89 09/10/2009 0540   HGB 12.1 01/27/2016 1006   HCT 37.0 01/27/2016 1006   PLT 241 09/10/2009 0540   MCV 86 01/27/2016 1006   MCH 28.3 01/27/2016 1006   MCH 29.0 09/10/2009 0540   MCHC 32.7 01/27/2016 1006   MCHC 33.0 09/10/2009 0540   RDW 13.3 01/27/2016 1006   LYMPHSABS 2.0 01/27/2016 1006   EOSABS 0.2 01/27/2016 1006   BASOSABS 0.0 01/27/2016 1006   Iron/TIBC/Ferritin/ %Sat No results found for: IRON, TIBC, FERRITIN, IRONPCTSAT Lipid Panel     Component Value Date/Time   CHOL 162 09/29/2016 0937   TRIG 140 09/29/2016 0937   HDL 52 09/29/2016 0937   LDLCALC 82 09/29/2016 0937   Hepatic Function Panel     Component Value Date/Time   PROT 7.0 09/29/2016 0937   ALBUMIN 4.3 09/29/2016 0937   AST 15 09/29/2016 0937   ALT 20 09/29/2016 0937   ALKPHOS 87 09/29/2016 0937   BILITOT 0.3 09/29/2016 0937      Component Value Date/Time   TSH 0.52 04/04/2017 0859   TSH 0.436 (L) 09/29/2016 0937   TSH 0.443 (L) 05/09/2016 1005    ASSESSMENT AND PLAN: Type 2 diabetes mellitus with hyperglycemia, without long-term current use of insulin (HCC)  Class 3 severe obesity with serious comorbidity and body mass index (BMI) of 45.0 to 49.9 in adult, unspecified obesity type ([place])  PLAN:  Diabetes II Blessings has been given extensive diabetes education by myself today including ideal fasting and post-prandial blood glucose readings, individual ideal Hgb A1c goals and hypoglycemia prevention. We discussed the  importance of good blood sugar control to decrease the likelihood of diabetic complications such as nephropathy, neuropathy, limb loss, blindness, coronary artery disease, and death. We discussed the importance of intensive lifestyle modification including diet, exercise and weight loss as the first line treatment for diabetes. Lydiann agrees to increase Victoza to 1.8 mg (no prescription needed) and she agrees to continue taking her diabetes medications. Malaika agrees to follow up with our clinic in 3 weeks.  We spent > than 50% of the 15 minute visit on the counseling as documented in the note.  Obesity Harumi is currently in the action stage of change. As such, her goal is to continue with weight loss efforts She has agreed to portion control better and make smarter food choices, such as increase vegetables and decrease simple carbohydrates  Allia has been instructed to work up to a goal of 150 minutes of combined cardio and strengthening exercise per week for weight loss and overall health benefits. We discussed the following Behavioral Modification Strategies today: decreasing simple carbohydrates  and decrease junk food   Sophee has agreed to follow up with our clinic in 3 weeks. She was informed of the importance of frequent follow up visits to maximize her success with intensive lifestyle modifications for her multiple health conditions.   Wilhemena Durie, am acting as transcriptionist for Lacy Duverney, PA-C I, Lacy Duverney [facility], have reviewed this note and agree with its content

## 2017-06-22 ENCOUNTER — Encounter (INDEPENDENT_AMBULATORY_CARE_PROVIDER_SITE_OTHER): Payer: Self-pay

## 2017-07-03 ENCOUNTER — Ambulatory Visit (INDEPENDENT_AMBULATORY_CARE_PROVIDER_SITE_OTHER): Payer: 59 | Admitting: Physician Assistant

## 2017-07-03 VITALS — BP 128/88 | HR 100 | Temp 97.8°F | Ht 64.0 in | Wt 262.0 lb

## 2017-07-03 DIAGNOSIS — Z6836 Body mass index (BMI) 36.0-36.9, adult: Secondary | ICD-10-CM | POA: Diagnosis not present

## 2017-07-03 DIAGNOSIS — E119 Type 2 diabetes mellitus without complications: Secondary | ICD-10-CM

## 2017-07-03 NOTE — Progress Notes (Signed)
Office: [phone]  /  Fax: [phone]   HPI:   Chief Complaint: OBESITY Veronica Russell is here to discuss her progress with her obesity treatment plan. She is on the portion control better and make smarter food choices, such as increase vegetables and decrease simple carbohydrates and is following her eating plan approximately 70 % of the time. She states she is exercising 0 minutes 0 times per week. Veronica Russell continues to do well with weight loss. She was sick and has skipped some meals.  Her weight is 262 lb (118.8 kg) today and has had a weight loss of 3 pounds over a period of 2 to 3 weeks since her last visit. She has lost 0 lbs since starting treatment with [name].  Diabetes II Veronica Russell has a diagnosis of diabetes type II. Veronica Russell states fasting BGs range between 110's and 140's and she denies any hypoglycemic episodes. She is on Victoza, metformin, and Januvia. Last A1c was 7.8 on 05/10/17. She has been working on intensive lifestyle modifications including diet, exercise, and weight loss to help control her blood glucose levels.  ALLERGIES: Allergies  Allergen Reactions  . Milk-Related Compounds     Lactose intollerance    MEDICATIONS: Current Outpatient Medications on File Prior to Visit  Medication Sig Dispense Refill  . albuterol (VENTOLIN HFA) 108 (90 Base) MCG/ACT inhaler Inhale into the lungs every 6 (six) hours as needed for wheezing or shortness of breath.    . Alcohol Swabs (ALCOHOL WIPES) 70 % PADS 30 Packages by Does not apply route 2 (two) times daily. 100 each 0  . amLODipine (NORVASC) 5 MG tablet Take 5 mg by mouth daily.    Marland Kitchen atorvastatin (LIPITOR) 20 MG tablet Take 20 mg by mouth daily.    . calcium-vitamin D (OSCAL WITH D) 500-200 MG-UNIT tablet Take 2 tablets by mouth every morning. 60 tablet 0  . Clobetasol Propionate 0.05 % lotion Reported on 07/21/2015    . fluticasone (FLOVENT HFA) 110 MCG/ACT inhaler Inhale into the lungs 2 (two) times daily.    Marland Kitchen glucose blood  (ACCU-CHEK GUIDE) test strip Use twice daily 100 each 0  . Insulin Pen Needle (BD PEN NEEDLE NANO U/F) 32G X 4 MM MISC 1 Package by Does not apply route daily. 100 each 0  . ipratropium (ATROVENT HFA) 17 MCG/ACT inhaler Inhale 2 puffs into the lungs 2 (two) times daily.    . Lancets (ACCU-CHEK SAFE-T PRO) lancets Use as instructed 100 each 0  . losartan-hydrochlorothiazide (HYZAAR) 50-12.5 MG tablet Take 1 tablet by mouth daily.    . Melatonin 3 MG TABS Take 1 tablet by mouth at bedtime as needed.    . metFORMIN (GLUCOPHAGE-XR) 500 MG 24 hr tablet Take 1,000 mg by mouth at bedtime.     . montelukast (SINGULAIR) 10 MG tablet Take 10 mg by mouth at bedtime.    . naproxen sodium (ALEVE) 220 MG tablet Take 220 mg by mouth.    . propranolol (INDERAL) 20 MG tablet Take 20 mg by mouth daily.     . sitaGLIPtin (JANUVIA) 50 MG tablet Take 50 mg by mouth daily.    . Vitamin D, Ergocalciferol, (DRISDOL) 50000 units CAPS capsule Take 50,000 Units by mouth every 7 (seven) days.    Marland Kitchen liraglutide (VICTOZA) 18 MG/3ML SOPN Inject 0.25 mLs (1.5 mg total) into the skin daily. 3 pen 0  . omeprazole (PRILOSEC) 20 MG capsule Take 1 capsule (20 mg total) by mouth daily. 30 capsule 1  .  omeprazole (PRILOSEC) 20 MG capsule Take 1 capsule (20 mg total) by mouth daily. 30 capsule 3   No current facility-administered medications on file prior to visit.     PAST MEDICAL HISTORY: Past Medical History:  Diagnosis Date  . Alopecia   . Asthma   . Back pain   . Diabetes mellitus ([place])   . GERD (gastroesophageal reflux disease)   . Hyperlipidemia   . Hypertension   . Joint pain   . Lactose intolerance   . Obesity   . Raynauds syndrome   . Swelling   . Tremor   . Vitamin D deficiency     PAST SURGICAL HISTORY: Past Surgical History:  Procedure Laterality Date  . ABDOMINAL HYSTERECTOMY    . BREAST BIOPSY    . MYOMECTOMY      SOCIAL HISTORY: Social History   Tobacco Use  . Smoking status: Never Smoker    . Smokeless tobacco: Never Used  Substance Use Topics  . Alcohol use: No  . Drug use: No    FAMILY HISTORY: Family History  Problem Relation Age of Onset  . Multiple myeloma Mother   . Hypertension Mother   . Hyperlipidemia Mother   . Cancer Mother   . Obesity Mother   . Hypertension Father   . Hyperlipidemia Father   . Cancer Father   . Asthma Other   . Hyperlipidemia Other   . Hypertension Other   . Cancer Other   . COPD Other   . Stroke Other   . Breast cancer Maternal Aunt   . Breast cancer Paternal Aunt   . Breast cancer Maternal Aunt     ROS: Review of Systems  Constitutional: Positive for weight loss.  Endo/Heme/Allergies:       Negative hypoglycemia    PHYSICAL EXAM: Blood pressure 128/88, pulse 100, temperature 97.8 F (36.6 C), temperature source Oral, height 5' 4" (1.626 m), weight 262 lb (118.8 kg), last menstrual period 08/31/2008, SpO2 97 %. Body mass index is 44.97 kg/m. Physical Exam  Constitutional: She is oriented to person, place, and time. She appears well-developed and well-nourished.  Cardiovascular: Normal rate.  Pulmonary/Chest: Effort normal.  Musculoskeletal: Normal range of motion.  Neurological: She is oriented to person, place, and time.  Skin: Skin is warm and dry.  Psychiatric: She has a normal mood and affect. Her behavior is normal.  Vitals reviewed.   RECENT LABS AND TESTS: BMET    Component Value Date/Time   NA 142 05/10/2017   K 3.8 05/10/2017   CL 102 09/29/2016 0937   CO2 23 09/29/2016 0937   GLUCOSE 114 (H) 09/29/2016 0937   GLUCOSE 99 09/10/2009 0540   BUN 14 05/10/2017   CREATININE 0.7 05/10/2017   CREATININE 0.74 09/29/2016 0937   CALCIUM 10.2 09/29/2016 0937   GFRNONAA 90 09/29/2016 0937   GFRAA 104 09/29/2016 0937   Lab Results  Component Value Date   HGBA1C 7.8 05/10/2017   HGBA1C 6.6 (H) 09/29/2016   HGBA1C 6.5 (H) 05/09/2016   HGBA1C 6.7 (H) 01/27/2016   HGBA1C 6.9 01/12/2016   Lab Results   Component Value Date   INSULIN 39.0 (H) 09/29/2016   INSULIN 38.5 (H) 05/09/2016   INSULIN 27.3 (H) 01/27/2016   CBC    Component Value Date/Time   WBC 6.8 01/27/2016 1006   WBC 11.1 (H) 09/10/2009 0540   RBC 4.28 01/27/2016 1006   RBC 3.89 09/10/2009 0540   HGB 12.1 01/27/2016 1006   HCT 37.0 01/27/2016  1006   PLT 241 09/10/2009 0540   MCV 86 01/27/2016 1006   MCH 28.3 01/27/2016 1006   MCH 29.0 09/10/2009 0540   MCHC 32.7 01/27/2016 1006   MCHC 33.0 09/10/2009 0540   RDW 13.3 01/27/2016 1006   LYMPHSABS 2.0 01/27/2016 1006   EOSABS 0.2 01/27/2016 1006   BASOSABS 0.0 01/27/2016 1006   Iron/TIBC/Ferritin/ %Sat No results found for: IRON, TIBC, FERRITIN, IRONPCTSAT Lipid Panel     Component Value Date/Time   CHOL 177 05/10/2017   CHOL 162 09/29/2016 0937   TRIG 182 (A) 05/10/2017   HDL 49 05/10/2017   HDL 52 09/29/2016 0937   LDLCALC 92 05/10/2017   LDLCALC 82 09/29/2016 0937   Hepatic Function Panel     Component Value Date/Time   PROT 7.0 09/29/2016 0937   ALBUMIN 4.3 09/29/2016 0937   AST 19 05/10/2017   ALT 34 05/10/2017   ALKPHOS 79 05/10/2017   BILITOT 0.3 09/29/2016 0937      Component Value Date/Time   TSH 0.52 04/04/2017 0859   TSH 0.436 (L) 09/29/2016 0937   TSH 0.443 (L) 05/09/2016 1005    ASSESSMENT AND PLAN: Type 2 diabetes mellitus without complication, without long-term current use of insulin (HCC)  Class 2 severe obesity with serious comorbidity and body mass index (BMI) of 36.0 to 36.9 in adult, unspecified obesity type ([place])  PLAN:  Diabetes II Veronica Russell has been given extensive diabetes education by myself today including ideal fasting and post-prandial blood glucose readings, individual ideal Hgb A1c goals and hypoglycemia prevention. We discussed the importance of good blood sugar control to decrease the likelihood of diabetic complications such as nephropathy, neuropathy, limb loss, blindness, coronary artery disease, and death. We  discussed the importance of intensive lifestyle modification including diet, exercise and weight loss as the first line treatment for diabetes. Veronica Russell agrees to continue her diabetes medications and she agrees to follow up with our clinic in 2 weeks.  We spent > than 50% of the 15 minute visit on the counseling as documented in the note.  Obesity Veronica Russell is currently in the action stage of change. As such, her goal is to continue with weight loss efforts She has agreed to portion control better and make smarter food choices, such as increase vegetables and decrease simple carbohydrates  Veronica Russell has been instructed to work up to a goal of 150 minutes of combined cardio and strengthening exercise per week for weight loss and overall health benefits. We discussed the following Behavioral Modification Strategies today: increasing lean protein intake and work on meal planning and easy cooking plans   Veronica Russell has agreed to follow up with our clinic in 2 weeks. She was informed of the importance of frequent follow up visits to maximize her success with intensive lifestyle modifications for her multiple health conditions.   Wilhemena Durie, am acting as transcriptionist for Lacy Duverney, PA-C I, Lacy Duverney [facility], have reviewed this note and agree with its content

## 2017-07-05 MED FILL — OMEPRAZOLE 20 MG CAP: 20 | 30 days supply | Qty: 30 | Fill #1

## 2017-07-05 MED FILL — MONTELUKAST SOD 10 MG TAB: 10 | 30 days supply | Qty: 30 | Fill #4

## 2017-07-24 ENCOUNTER — Ambulatory Visit (INDEPENDENT_AMBULATORY_CARE_PROVIDER_SITE_OTHER): Payer: 59 | Admitting: Physician Assistant

## 2017-07-24 VITALS — BP 108/74 | HR 105 | Temp 98.2°F | Ht 64.0 in | Wt 261.0 lb

## 2017-07-24 DIAGNOSIS — Z6841 Body Mass Index (BMI) 40.0 and over, adult: Secondary | ICD-10-CM

## 2017-07-24 DIAGNOSIS — E119 Type 2 diabetes mellitus without complications: Secondary | ICD-10-CM

## 2017-07-24 DIAGNOSIS — Z9189 Other specified personal risk factors, not elsewhere classified: Secondary | ICD-10-CM

## 2017-07-24 DIAGNOSIS — E559 Vitamin D deficiency, unspecified: Secondary | ICD-10-CM | POA: Diagnosis not present

## 2017-07-24 DIAGNOSIS — E66813 Obesity, class 3: Secondary | ICD-10-CM

## 2017-07-24 MED ORDER — LIRAGLUTIDE 18 MG/3ML ~~LOC~~ SOPN: 1.5000 mg | PEN_INJECTOR | Freq: Every day | SUBCUTANEOUS | 0 refills | Status: DC

## 2017-07-24 NOTE — Progress Notes (Signed)
Office: [phone]  /  Fax: [phone]   HPI:   Chief Complaint: OBESITY Veronica Russell is here to discuss her progress with her obesity treatment plan. She is on the portion control better and make smarter food choices, such as increase vegetables and decrease simple carbohydrates and is following her eating plan approximately 70 % of the time. She states she is exercising 0 minutes 0 times per week. Veronica Russell continues to do well with weight loss. She is mindful of her food choices but is not following the structured meal plan. She continues to have challenges with pre-planning her meals.  Her weight is 261 lb (118.4 kg) today and has had a weight loss of 1 pound over a period of 3 weeks since her last visit. She has lost 0 lbs since starting treatment with [name].  Diabetes II Veronica Russell has a diagnosis of diabetes type II. Veronica Russell states she is not checking BGs at home. She denies hypoglycemia, on Januvia, Victoza, and metformin. Last A1c was 7.8 on 05/10/17. She has been working on intensive lifestyle modifications including diet, exercise, and weight loss to help control her blood glucose levels.  At risk for cardiovascular disease Veronica Russell is at a higher than average risk for cardiovascular disease due to obesity and diabetes II. She currently denies any chest pain.  Vitamin D Deficiency Veronica Russell has a diagnosis of vitamin D deficiency. She is currently taking prescription Vit D and denies nausea, vomiting or muscle weakness.  ALLERGIES: Allergies  Allergen Reactions  . Milk-Related Compounds     Lactose intollerance    MEDICATIONS: Current Outpatient Medications on File Prior to Visit  Medication Sig Dispense Refill  . albuterol (VENTOLIN HFA) 108 (90 Base) MCG/ACT inhaler Inhale into the lungs every 6 (six) hours as needed for wheezing or shortness of breath.    . Alcohol Swabs (ALCOHOL WIPES) 70 % PADS 30 Packages by Does not apply route 2 (two) times daily. 100 each 0  . amLODipine (NORVASC) 5 MG  tablet Take 5 mg by mouth daily.    Marland Kitchen atorvastatin (LIPITOR) 20 MG tablet Take 20 mg by mouth daily.    . calcium-vitamin D (OSCAL WITH D) 500-200 MG-UNIT tablet Take 2 tablets by mouth every morning. 60 tablet 0  . Clobetasol Propionate 0.05 % lotion Reported on 07/21/2015    . fluticasone (FLOVENT HFA) 110 MCG/ACT inhaler Inhale into the lungs 2 (two) times daily.    Marland Kitchen glucose blood (ACCU-CHEK GUIDE) test strip Use twice daily 100 each 0  . Insulin Pen Needle (BD PEN NEEDLE NANO U/F) 32G X 4 MM MISC 1 Package by Does not apply route daily. 100 each 0  . ipratropium (ATROVENT HFA) 17 MCG/ACT inhaler Inhale 2 puffs into the lungs 2 (two) times daily.    . Lancets (ACCU-CHEK SAFE-T PRO) lancets Use as instructed 100 each 0  . losartan-hydrochlorothiazide (HYZAAR) 50-12.5 MG tablet Take 1 tablet by mouth daily.    . Melatonin 3 MG TABS Take 1 tablet by mouth at bedtime as needed.    . metFORMIN (GLUCOPHAGE-XR) 500 MG 24 hr tablet Take 1,000 mg by mouth at bedtime.     . montelukast (SINGULAIR) 10 MG tablet Take 10 mg by mouth at bedtime.    Marland Kitchen omeprazole (PRILOSEC) 20 MG capsule Take 1 capsule (20 mg total) by mouth daily. 30 capsule 3  . propranolol (INDERAL) 20 MG tablet Take 20 mg by mouth daily.     . sitaGLIPtin (JANUVIA) 50 MG tablet Take 50  mg by mouth daily.    . Vitamin D, Ergocalciferol, (DRISDOL) 50000 units CAPS capsule Take 50,000 Units by mouth every 7 (seven) days.    Marland Kitchen liraglutide (VICTOZA) 18 MG/3ML SOPN Inject 0.25 mLs (1.5 mg total) into the skin daily. 3 pen 0  . omeprazole (PRILOSEC) 20 MG capsule Take 1 capsule (20 mg total) by mouth daily. 30 capsule 1   No current facility-administered medications on file prior to visit.     PAST MEDICAL HISTORY: Past Medical History:  Diagnosis Date  . Alopecia   . Asthma   . Back pain   . Diabetes mellitus ([place])   . GERD (gastroesophageal reflux disease)   . Hyperlipidemia   . Hypertension   . Joint pain   . Lactose intolerance    . Obesity   . Raynauds syndrome   . Swelling   . Tremor   . Vitamin D deficiency     PAST SURGICAL HISTORY: Past Surgical History:  Procedure Laterality Date  . ABDOMINAL HYSTERECTOMY    . BREAST BIOPSY    . MYOMECTOMY      SOCIAL HISTORY: Social History   Tobacco Use  . Smoking status: Never Smoker  . Smokeless tobacco: Never Used  Substance Use Topics  . Alcohol use: No  . Drug use: No    FAMILY HISTORY: Family History  Problem Relation Age of Onset  . Multiple myeloma Mother   . Hypertension Mother   . Hyperlipidemia Mother   . Cancer Mother   . Obesity Mother   . Hypertension Father   . Hyperlipidemia Father   . Cancer Father   . Asthma Other   . Hyperlipidemia Other   . Hypertension Other   . Cancer Other   . COPD Other   . Stroke Other   . Breast cancer Maternal Aunt   . Breast cancer Paternal Aunt   . Breast cancer Maternal Aunt     ROS: Review of Systems  Constitutional: Positive for weight loss.  Cardiovascular: Negative for chest pain.  Gastrointestinal: Negative for nausea and vomiting.  Musculoskeletal:       Negative muscle weakness  Endo/Heme/Allergies:       Negative hypoglycemia    PHYSICAL EXAM: Blood pressure 108/74, pulse (!) 105, temperature 98.2 F (36.8 C), temperature source Oral, height 5' 4"  (1.626 m), weight 261 lb (118.4 kg), last menstrual period 08/31/2008, SpO2 98 %. Body mass index is 44.8 kg/m. Physical Exam  Constitutional: She is oriented to person, place, and time. She appears well-developed and well-nourished.  Cardiovascular: Tachycardia present.  Pulmonary/Chest: Effort normal.  Musculoskeletal: Normal range of motion.  Neurological: She is oriented to person, place, and time.  Skin: Skin is warm and dry.  Psychiatric: She has a normal mood and affect. Her behavior is normal.  Vitals reviewed.   RECENT LABS AND TESTS: BMET    Component Value Date/Time   NA 142 05/10/2017   K 3.8 05/10/2017   CL  102 09/29/2016 0937   CO2 23 09/29/2016 0937   GLUCOSE 114 (H) 09/29/2016 0937   GLUCOSE 99 09/10/2009 0540   BUN 14 05/10/2017   CREATININE 0.7 05/10/2017   CREATININE 0.74 09/29/2016 0937   CALCIUM 10.2 09/29/2016 0937   GFRNONAA 90 09/29/2016 0937   GFRAA 104 09/29/2016 0937   Lab Results  Component Value Date   HGBA1C 7.8 05/10/2017   HGBA1C 6.6 (H) 09/29/2016   HGBA1C 6.5 (H) 05/09/2016   HGBA1C 6.7 (H) 01/27/2016   HGBA1C  6.9 01/12/2016   Lab Results  Component Value Date   INSULIN 39.0 (H) 09/29/2016   INSULIN 38.5 (H) 05/09/2016   INSULIN 27.3 (H) 01/27/2016   CBC    Component Value Date/Time   WBC 6.8 01/27/2016 1006   WBC 11.1 (H) 09/10/2009 0540   RBC 4.28 01/27/2016 1006   RBC 3.89 09/10/2009 0540   HGB 12.1 01/27/2016 1006   HCT 37.0 01/27/2016 1006   PLT 241 09/10/2009 0540   MCV 86 01/27/2016 1006   MCH 28.3 01/27/2016 1006   MCH 29.0 09/10/2009 0540   MCHC 32.7 01/27/2016 1006   MCHC 33.0 09/10/2009 0540   RDW 13.3 01/27/2016 1006   LYMPHSABS 2.0 01/27/2016 1006   EOSABS 0.2 01/27/2016 1006   BASOSABS 0.0 01/27/2016 1006   Iron/TIBC/Ferritin/ %Sat No results found for: IRON, TIBC, FERRITIN, IRONPCTSAT Lipid Panel     Component Value Date/Time   CHOL 177 05/10/2017   CHOL 162 09/29/2016 0937   TRIG 182 (A) 05/10/2017   HDL 49 05/10/2017   HDL 52 09/29/2016 0937   LDLCALC 92 05/10/2017   LDLCALC 82 09/29/2016 0937   Hepatic Function Panel     Component Value Date/Time   PROT 7.0 09/29/2016 0937   ALBUMIN 4.3 09/29/2016 0937   AST 19 05/10/2017   ALT 34 05/10/2017   ALKPHOS 79 05/10/2017   BILITOT 0.3 09/29/2016 0937      Component Value Date/Time   TSH 0.52 04/04/2017 0859   TSH 0.436 (L) 09/29/2016 0937   TSH 0.443 (L) 05/09/2016 1005  Results for MAITE, BURLISON (MRN [id number]) as of 07/24/2017 17:52  Ref. Range 09/29/2016 09:37  Vitamin D, 25-Hydroxy Latest Ref Range: 30.0 - 100.0 ng/mL 53.2    ASSESSMENT AND PLAN: Type 2  diabetes mellitus without complication, without long-term current use of insulin (HCC) - Plan: liraglutide (VICTOZA) 18 MG/3ML SOPN  Vitamin D deficiency  At risk for heart disease  Class 3 severe obesity with serious comorbidity and body mass index (BMI) of 40.0 to 44.9 in adult, unspecified obesity type ([place])  PLAN:  Diabetes II Veronica Russell has been given extensive diabetes education by myself today including ideal fasting and post-prandial blood glucose readings, individual ideal Hgb A1c goals and hypoglycemia prevention. We discussed the importance of good blood sugar control to decrease the likelihood of diabetic complications such as nephropathy, neuropathy, limb loss, blindness, coronary artery disease, and death. We discussed the importance of intensive lifestyle modification including diet, exercise and weight loss as the first line treatment for diabetes. Veronica Russell agrees to continue Victoza 1.5 mg #3 pens and we will refill for 1 month. Veronica Russell agrees to follow up with our clinic in 3 to 4 weeks.  Cardiovascular risk counselling Veronica Russell was given extended (15 minutes) coronary artery disease prevention counseling today. She is 57 y.o. female and has risk factors for heart disease including obesity and diabetes II. We discussed intensive lifestyle modifications today with an emphasis on specific weight loss instructions and strategies. Pt was also informed of the importance of increasing exercise and decreasing saturated fats to help prevent heart disease.  Vitamin D Deficiency Veronica Russell was informed that low vitamin D levels contributes to fatigue and are associated with obesity, breast, and colon cancer. Veronica Russell agrees to continue taking prescription Vit D @50 ,000 IU every week and will follow up for routine testing of vitamin D, at least 2-3 times per year. She was informed of the risk of over-replacement of vitamin D and agrees to not  increase her dose unless she discusses this with [name] first. Veronica Russell agrees  to follow up with our clinic in 3 to 4 weeks.  Obesity Veronica Russell is currently in the action stage of change. As such, her goal is to continue with weight loss efforts She has agreed to portion control better and make smarter food choices, such as increase vegetables and decrease simple carbohydrates  Veronica Russell has been instructed to work up to a goal of 150 minutes of combined cardio and strengthening exercise per week for weight loss and overall health benefits. We discussed the following Behavioral Modification Strategies today: increasing lean protein intake and work on meal planning and easy cooking plans   Yazlynn has agreed to follow up with our clinic in 3 to 4 weeks. She was informed of the importance of frequent follow up visits to maximize her success with intensive lifestyle modifications for her multiple health conditions.   Veronica Russell Durie, am acting as transcriptionist for Lacy Duverney, PA-C I, Lacy Duverney [facility], have reviewed this note and agree with its content

## 2017-07-25 ENCOUNTER — Other Ambulatory Visit: Payer: Self-pay | Admitting: Family Medicine

## 2017-07-25 DIAGNOSIS — Z1231 Encounter for screening mammogram for malignant neoplasm of breast: Secondary | ICD-10-CM

## 2017-07-25 MED FILL — VICTOZA 18 MG/3 ML INJECT P: 18 | 36 days supply | Qty: 9 | Fill #0

## 2017-07-31 MED FILL — OMEPRAZOLE 20 MG CAP: 20 | 30 days supply | Qty: 30 | Fill #2

## 2017-07-31 MED FILL — MONTELUKAST SOD 10 MG TAB: 10 | 30 days supply | Qty: 30 | Fill #5

## 2017-08-07 MED FILL — LOSARTAN POTASSIUM 50 MG TA: 50 | 90 days supply | Qty: 90 | Fill #1

## 2017-08-07 MED FILL — HYDROCHLOROTHIAZIDE 12.5 MG: 12.5 | 90 days supply | Qty: 90 | Fill #1

## 2017-08-07 MED FILL — JANUVIA 50 MG TABLET: 50 | 90 days supply | Qty: 90 | Fill #1

## 2017-08-07 MED FILL — METFORMIN HCL ER 500 MG TAB: 500 | 90 days supply | Qty: 180 | Fill #0

## 2017-08-08 DIAGNOSIS — I10 Essential (primary) hypertension: Secondary | ICD-10-CM | POA: Diagnosis not present

## 2017-08-08 DIAGNOSIS — E119 Type 2 diabetes mellitus without complications: Secondary | ICD-10-CM | POA: Diagnosis not present

## 2017-08-10 MED FILL — AMLODIPINE BESYLATE 5 MG TA: 5 | 90 days supply | Qty: 90 | Fill #0

## 2017-08-10 MED FILL — PROPRANOLOL 20 MG TABLET: 20 | 90 days supply | Qty: 90 | Fill #0

## 2017-08-11 MED FILL — ATORVASTATIN 20 MG TABLET: 20 | 90 days supply | Qty: 90 | Fill #0

## 2017-08-14 DIAGNOSIS — M25559 Pain in unspecified hip: Secondary | ICD-10-CM | POA: Diagnosis not present

## 2017-08-14 DIAGNOSIS — M25551 Pain in right hip: Secondary | ICD-10-CM | POA: Diagnosis not present

## 2017-08-21 ENCOUNTER — Ambulatory Visit (INDEPENDENT_AMBULATORY_CARE_PROVIDER_SITE_OTHER): Payer: 59 | Admitting: Physician Assistant

## 2017-08-21 VITALS — BP 109/75 | HR 92 | Temp 98.2°F | Ht 64.0 in | Wt 261.0 lb

## 2017-08-21 DIAGNOSIS — Z6841 Body Mass Index (BMI) 40.0 and over, adult: Secondary | ICD-10-CM | POA: Diagnosis not present

## 2017-08-21 DIAGNOSIS — E119 Type 2 diabetes mellitus without complications: Secondary | ICD-10-CM | POA: Diagnosis not present

## 2017-08-21 NOTE — Progress Notes (Addendum)
Office: [phone]  /  Fax: [phone]   HPI:   Chief Complaint: OBESITY Veronica Russell is here to discuss her progress with her obesity treatment plan. She is on the portion control better and make smarter food choices plan and is following her eating plan approximately 70 % of the time. She states she is exercising 0 minutes 0 times per week. Veronica Russell maintained her weight. She makes better food choices and controls her portions. Her weight is 261 lb (118.4 kg) today and has maintained weight over a period of 4 weeks since her last visit. She has gained 2 lbs since starting treatment with Veronica Russell.  Diabetes II without complications, non insulin Veronica Russell has a diagnosis of diabetes type II. Veronica Russell states fasting BGs range between 110's and 150's. Veronica Russell is not checking blood sugar past dinner. She denies any hypoglycemic episodes. Last A1c was at 7.8 Veronica Russell is now on Januvia, Metformin and Victoza. She has been working on intensive lifestyle modifications including diet, exercise, and weight loss to help control her blood glucose levels.  ALLERGIES: Allergies  Allergen Reactions  . Milk-Related Compounds     Lactose intollerance    MEDICATIONS: Current Outpatient Medications on File Prior to Visit  Medication Sig Dispense Refill  . albuterol (VENTOLIN HFA) 108 (90 Base) MCG/ACT inhaler Inhale into the lungs every 6 (six) hours as needed for wheezing or shortness of breath.    . Alcohol Swabs (ALCOHOL WIPES) 70 % PADS 30 Packages by Does not apply route 2 (two) times daily. 100 each 0  . amLODipine (NORVASC) 5 MG tablet Take 5 mg by mouth daily.    Marland Kitchen atorvastatin (LIPITOR) 20 MG tablet Take 20 mg by mouth daily.    . calcium-vitamin D (OSCAL WITH D) 500-200 MG-UNIT tablet Take 2 tablets by mouth every morning. 60 tablet 0  . Clobetasol Propionate 0.05 % lotion Reported on 07/21/2015    . glucose blood (ACCU-CHEK GUIDE) test strip Use twice daily 100 each 0  . Insulin Pen Needle (BD PEN NEEDLE NANO  U/F) 32G X 4 MM MISC 1 Package by Does not apply route daily. 100 each 0  . Lancets (ACCU-CHEK SAFE-T PRO) lancets Use as instructed 100 each 0  . liraglutide (VICTOZA) 18 MG/3ML SOPN Inject 0.25 mLs (1.5 mg total) into the skin daily. 3 pen 0  . losartan-hydrochlorothiazide (HYZAAR) 50-12.5 MG tablet Take 1 tablet by mouth daily.    . Melatonin 3 MG TABS Take 1 tablet by mouth at bedtime as needed.    . metFORMIN (GLUCOPHAGE-XR) 500 MG 24 hr tablet Take 1,000 mg by mouth at bedtime.     . montelukast (SINGULAIR) 10 MG tablet Take 10 mg by mouth at bedtime.    . propranolol (INDERAL) 20 MG tablet Take 20 mg by mouth daily.     . sitaGLIPtin (JANUVIA) 50 MG tablet Take 100 mg by mouth daily.     Marland Kitchen omeprazole (PRILOSEC) 20 MG capsule Take 1 capsule (20 mg total) by mouth daily. 30 capsule 3   No current facility-administered medications on file prior to visit.     PAST MEDICAL HISTORY: Past Medical History:  Diagnosis Date  . Alopecia   . Asthma   . Back pain   . Diabetes mellitus ([place])   . GERD (gastroesophageal reflux disease)   . Hyperlipidemia   . Hypertension   . Joint pain   . Lactose intolerance   . Obesity   . Raynauds syndrome   . Swelling   .  Tremor   . Vitamin D deficiency     PAST SURGICAL HISTORY: Past Surgical History:  Procedure Laterality Date  . ABDOMINAL HYSTERECTOMY    . BREAST BIOPSY    . MYOMECTOMY      SOCIAL HISTORY: Social History   Tobacco Use  . Smoking status: Never Smoker  . Smokeless tobacco: Never Used  Substance Use Topics  . Alcohol use: No  . Drug use: No    FAMILY HISTORY: Family History  Problem Relation Age of Onset  . Multiple myeloma Mother   . Hypertension Mother   . Hyperlipidemia Mother   . Cancer Mother   . Obesity Mother   . Hypertension Father   . Hyperlipidemia Father   . Cancer Father   . Asthma Other   . Hyperlipidemia Other   . Hypertension Other   . Cancer Other   . COPD Other   . Stroke Other   .  Breast cancer Maternal Aunt   . Breast cancer Paternal Aunt   . Breast cancer Maternal Aunt     ROS: Review of Systems  Constitutional: Negative for weight loss.  Endo/Heme/Allergies:       Negative for hypoglycemia    PHYSICAL EXAM: Blood pressure 109/75, pulse 92, temperature 98.2 F (36.8 C), temperature source Oral, height '5\' 4"'$  (1.626 m), weight 261 lb (118.4 kg), last menstrual period 08/31/2008, SpO2 93 %. Body mass index is 44.8 kg/m. Physical Exam  Constitutional: She is oriented to person, place, and time. She appears well-developed and well-nourished.  Cardiovascular: Normal rate.  Pulmonary/Chest: Effort normal.  Musculoskeletal: Normal range of motion.  Neurological: She is oriented to person, place, and time.  Skin: Skin is warm and dry.  Psychiatric: She has a normal mood and affect. Her behavior is normal.  Vitals reviewed.   RECENT LABS AND TESTS: BMET    Component Value Date/Time   NA 142 05/10/2017   K 3.8 05/10/2017   CL 102 09/29/2016 0937   CO2 23 09/29/2016 0937   GLUCOSE 114 (H) 09/29/2016 0937   GLUCOSE 99 09/10/2009 0540   BUN 14 05/10/2017   CREATININE 0.7 05/10/2017   CREATININE 0.74 09/29/2016 0937   CALCIUM 10.2 09/29/2016 0937   GFRNONAA 90 09/29/2016 0937   GFRAA 104 09/29/2016 0937   Lab Results  Component Value Date   HGBA1C 7.8 05/10/2017   HGBA1C 6.6 (H) 09/29/2016   HGBA1C 6.5 (H) 05/09/2016   HGBA1C 6.7 (H) 01/27/2016   HGBA1C 6.9 01/12/2016   Lab Results  Component Value Date   INSULIN 39.0 (H) 09/29/2016   INSULIN 38.5 (H) 05/09/2016   INSULIN 27.3 (H) 01/27/2016   CBC    Component Value Date/Time   WBC 6.8 01/27/2016 1006   WBC 11.1 (H) 09/10/2009 0540   RBC 4.28 01/27/2016 1006   RBC 3.89 09/10/2009 0540   HGB 12.1 01/27/2016 1006   HCT 37.0 01/27/2016 1006   PLT 241 09/10/2009 0540   MCV 86 01/27/2016 1006   MCH 28.3 01/27/2016 1006   MCH 29.0 09/10/2009 0540   MCHC 32.7 01/27/2016 1006   MCHC 33.0  09/10/2009 0540   RDW 13.3 01/27/2016 1006   LYMPHSABS 2.0 01/27/2016 1006   EOSABS 0.2 01/27/2016 1006   BASOSABS 0.0 01/27/2016 1006   Iron/TIBC/Ferritin/ %Sat No results found for: IRON, TIBC, FERRITIN, IRONPCTSAT Lipid Panel     Component Value Date/Time   CHOL 177 05/10/2017   CHOL 162 09/29/2016 0937   TRIG 182 (A) 05/10/2017  HDL 49 05/10/2017   HDL 52 09/29/2016 0937   LDLCALC 92 05/10/2017   LDLCALC 82 09/29/2016 0937   Hepatic Function Panel     Component Value Date/Time   PROT 7.0 09/29/2016 0937   ALBUMIN 4.3 09/29/2016 0937   AST 19 05/10/2017   ALT 34 05/10/2017   ALKPHOS 79 05/10/2017   BILITOT 0.3 09/29/2016 0937      Component Value Date/Time   TSH 0.52 04/04/2017 0859   TSH 0.436 (L) 09/29/2016 0937   TSH 0.443 (L) 05/09/2016 1005   Results for KORRINE, SICARD (MRN [id number]) as of 08/21/2017 17:59  Ref. Range 09/29/2016 09:37  Vitamin D, 25-Hydroxy Latest Ref Range: 30.0 - 100.0 ng/mL 53.2   ASSESSMENT AND PLAN: Type 2 diabetes mellitus without complication, without long-term current use of insulin (HCC)  Class 3 severe obesity with serious comorbidity and body mass index (BMI) of 40.0 to 44.9 in adult, unspecified obesity type ([place])  PLAN:  Diabetes II without complications, non insulin Veronica Russell has been given extensive diabetes education by myself today including ideal fasting and post-prandial blood glucose readings, individual ideal Hgb A1c goals and hypoglycemia prevention. We discussed the importance of good blood sugar control to decrease the likelihood of diabetic complications such as nephropathy, neuropathy, limb loss, blindness, coronary artery disease, and death. We discussed the importance of intensive lifestyle modification including diet, exercise and weight loss as the first line treatment for diabetes. Veronica Russell agrees to continue her diabetes medications and will follow up at the agreed upon time.  We spent > than 50% of the 15 minute  visit on the counseling as documented in the note.  Obesity Veronica Russell is currently in the action stage of change. As such, her goal is to continue with weight loss efforts She has agreed to portion control better and make smarter food choices, such as increase vegetables and decrease simple carbohydrates  Veronica Russell has been instructed to work up to a goal of 150 minutes of combined cardio and strengthening exercise per week for weight loss and overall health benefits. We discussed the following Behavioral Modification Strategies today: increasing lean protein intake and work on meal planning and easy cooking plans  Veronica Russell has agreed to follow up with our clinic in 3 to 4 weeks. She was informed of the importance of frequent follow up visits to maximize her success with intensive lifestyle modifications for her multiple health conditions.  Corey Skains, am acting as transcriptionist for [employer], PA-C I, Lacy Duverney [facility], have reviewed this note and agree with its content

## 2017-08-31 MED FILL — MONTELUKAST SOD 10 MG TAB: 10 | 90 days supply | Qty: 90 | Fill #0

## 2017-09-01 MED FILL — OMEPRAZOLE 20 MG CAP: 20 | 30 days supply | Qty: 30 | Fill #3

## 2017-09-08 ENCOUNTER — Ambulatory Visit
Admission: RE | Admit: 2017-09-08 | Discharge: 2017-09-08 | Disposition: A | Discharge status: Home / Self Care | Payer: 59 | Source: Ambulatory Visit | Attending: Family Medicine | Admitting: Family Medicine

## 2017-09-08 DIAGNOSIS — H52223 Regular astigmatism, bilateral: Secondary | ICD-10-CM | POA: Diagnosis not present

## 2017-09-08 DIAGNOSIS — H5213 Myopia, bilateral: Secondary | ICD-10-CM | POA: Diagnosis not present

## 2017-09-08 DIAGNOSIS — Z7984 Long term (current) use of oral hypoglycemic drugs: Secondary | ICD-10-CM | POA: Diagnosis not present

## 2017-09-08 DIAGNOSIS — E119 Type 2 diabetes mellitus without complications: Secondary | ICD-10-CM | POA: Diagnosis not present

## 2017-09-08 DIAGNOSIS — H04123 Dry eye syndrome of bilateral lacrimal glands: Secondary | ICD-10-CM | POA: Diagnosis not present

## 2017-09-08 DIAGNOSIS — Z1231 Encounter for screening mammogram for malignant neoplasm of breast: Secondary | ICD-10-CM | POA: Diagnosis not present

## 2017-09-08 DIAGNOSIS — H524 Presbyopia: Secondary | ICD-10-CM | POA: Diagnosis not present

## 2017-09-08 DIAGNOSIS — H40013 Open angle with borderline findings, low risk, bilateral: Secondary | ICD-10-CM | POA: Diagnosis not present

## 2017-09-14 NOTE — Telephone Encounter (Signed)
error 

## 2017-09-15 ENCOUNTER — Encounter: Payer: Self-pay | Admitting: Internal Medicine

## 2017-09-19 ENCOUNTER — Ambulatory Visit (INDEPENDENT_AMBULATORY_CARE_PROVIDER_SITE_OTHER): Payer: 59 | Admitting: Physician Assistant

## 2017-09-19 VITALS — BP 118/78 | HR 109 | Temp 98.1°F | Ht 64.0 in | Wt 266.0 lb

## 2017-09-19 DIAGNOSIS — E119 Type 2 diabetes mellitus without complications: Secondary | ICD-10-CM

## 2017-09-19 DIAGNOSIS — I10 Essential (primary) hypertension: Secondary | ICD-10-CM | POA: Diagnosis not present

## 2017-09-19 DIAGNOSIS — Z9189 Other specified personal risk factors, not elsewhere classified: Secondary | ICD-10-CM | POA: Diagnosis not present

## 2017-09-19 DIAGNOSIS — Z6841 Body Mass Index (BMI) 40.0 and over, adult: Secondary | ICD-10-CM

## 2017-09-19 MED ORDER — INSULIN PEN NEEDLE 32G X 4 MM MISC: 1.0000 | Freq: Every day | 0 refills | Status: DC

## 2017-09-19 MED ORDER — GLUCOSE BLOOD VI STRP: ORAL_STRIP | 0 refills | Status: DC

## 2017-09-19 MED ORDER — ACCU-CHEK SAFE-T PRO LANCETS MISC: 0 refills | Status: DC

## 2017-09-19 MED ORDER — LIRAGLUTIDE 18 MG/3ML ~~LOC~~ SOPN: 1.8000 mg | PEN_INJECTOR | Freq: Every morning | SUBCUTANEOUS | 0 refills | Status: DC

## 2017-09-20 MED FILL — HYDROCHLOROTHIAZIDE 25 MG T: 25 | 90 days supply | Qty: 90 | Fill #0

## 2017-09-20 MED FILL — UNIFINE PENTIPS 32GX5/32": 32G X 4 MM | 90 days supply | Qty: 100 | Fill #0

## 2017-09-20 MED FILL — VICTOZA 18 MG/3 ML INJECT P: 18 | 30 days supply | Qty: 9 | Fill #0

## 2017-09-20 MED FILL — JANUVIA 100 MG TABLET: 100 | 90 days supply | Qty: 90 | Fill #0

## 2017-09-20 MED FILL — UNIFINE PENTIPS 32GX5/32: 32G X 4 MM | 90 days supply | Qty: 100 | Fill #0

## 2017-09-20 MED FILL — ACCU-CHEK FASTCLIX LANCETS: 90 days supply | Qty: 102 | Fill #0

## 2017-09-20 MED FILL — ACCU-CHEK GUIDE STRP: 50 days supply | Qty: 100 | Fill #0

## 2017-09-20 NOTE — Progress Notes (Signed)
Office: [phone]  /  Fax: [phone]   HPI:   Chief Complaint: OBESITY Veronica Russell is here to discuss her progress with her obesity treatment plan. She is on the portion control better and make smarter food choices plan and is following her eating plan approximately 60 % of the time. She states she is exercising 0 minutes 0 times per week. Veronica Russell reports struggling with portion control and making smarter food choices, due to this being her birthday month and with her attending multiple celebrations. She is willing to change plans to help with blood sugars and weight loss. Her weight is 266 lb (120.7 kg) today and she has not lost weight since her last visit. She has lost 0 lbs since starting treatment with [name].  Diabetes II Veronica Russell has Veronica diagnosis of diabetes type II. She sees her endocrinologist in two weeks. Veronica Russell is on Victoza 1.2 mg and she has no nausea, vomiting or diarrhea. Veronica Russell denies any hypoglycemic episodes. Last A1c was at 7.8 She has been working on intensive lifestyle modifications including diet, exercise, and weight loss to help control her blood glucose levels.  Hypertension Veronica Russell is Veronica 57 y.o. female with hypertension. Veronica Russell denies chest pain. She is working weight loss to help control her blood pressure with the goal of decreasing her risk of heart attack and stroke. [name] is controlled today.  At risk for cardiovascular disease Veronica Russell is at Veronica higher than average risk for cardiovascular disease due to obesity, diabetes and hypertension. She currently denies any chest pain.  ALLERGIES: Allergies  Allergen Reactions  . Milk-Related Compounds     Lactose intollerance    MEDICATIONS: Current Outpatient Medications on File Prior to Visit  Medication Sig Dispense Refill  . albuterol (VENTOLIN HFA) 108 (90 Base) MCG/ACT inhaler Inhale into the lungs every 6 (six) hours as needed for wheezing or shortness of breath.    . Alcohol Swabs  (ALCOHOL WIPES) 70 % PADS 30 Packages by Does not apply route 2 (two) times daily. 100 each 0  . amLODipine (NORVASC) 5 MG tablet Take 5 mg by mouth daily.    Marland Kitchen atorvastatin (LIPITOR) 20 MG tablet Take 20 mg by mouth daily.    . calcium-vitamin D (OSCAL WITH D) 500-200 MG-UNIT tablet Take 2 tablets by mouth every morning. 60 tablet 0  . Clobetasol Propionate 0.05 % lotion Reported on 07/21/2015    . losartan-hydrochlorothiazide (HYZAAR) 50-12.5 MG tablet Take 1 tablet by mouth daily.    . Melatonin 3 MG TABS Take 1 tablet by mouth at bedtime as needed.    . metFORMIN (GLUCOPHAGE-XR) 500 MG 24 hr tablet Take 1,000 mg by mouth at bedtime.     . montelukast (SINGULAIR) 10 MG tablet Take 10 mg by mouth at bedtime.    . propranolol (INDERAL) 20 MG tablet Take 20 mg by mouth daily.     . sitaGLIPtin (JANUVIA) 50 MG tablet Take 100 mg by mouth daily.     Marland Kitchen omeprazole (PRILOSEC) 20 MG capsule Take 1 capsule (20 mg total) by mouth daily. 30 capsule 3   No current facility-administered medications on file prior to visit.     PAST MEDICAL HISTORY: Past Medical History:  Diagnosis Date  . Alopecia   . Asthma   . Back pain   . Diabetes mellitus ([place])   . GERD (gastroesophageal reflux disease)   . Hyperlipidemia   . Hypertension   . Joint pain   . Lactose  intolerance   . Obesity   . Raynauds syndrome   . Swelling   . Tremor   . Vitamin D deficiency     PAST SURGICAL HISTORY: Past Surgical History:  Procedure Laterality Date  . ABDOMINAL HYSTERECTOMY    . BREAST BIOPSY    . MYOMECTOMY      SOCIAL HISTORY: Social History   Tobacco Use  . Smoking status: Never Smoker  . Smokeless tobacco: Never Used  Substance Use Topics  . Alcohol use: No  . Drug use: No    FAMILY HISTORY: Family History  Problem Relation Age of Onset  . Multiple myeloma Mother   . Hypertension Mother   . Hyperlipidemia Mother   . Cancer Mother   . Obesity Mother   . Hypertension Father   .  Hyperlipidemia Father   . Cancer Father   . Asthma Other   . Hyperlipidemia Other   . Hypertension Other   . Cancer Other   . COPD Other   . Stroke Other   . Breast cancer Maternal Aunt   . Breast cancer Paternal Aunt   . Breast cancer Maternal Aunt     ROS: Review of Systems  Constitutional: Negative for weight loss.  Cardiovascular: Negative for chest pain.  Gastrointestinal: Negative for diarrhea, nausea and vomiting.  Endo/Heme/Allergies:       Negative for hypoglycemia    PHYSICAL EXAM: Blood pressure 118/78, pulse (!) 109, temperature 98.1 F (36.7 C), temperature source Oral, height 5' 4" (1.626 m), weight 266 lb (120.7 kg), last menstrual period 08/31/2008, SpO2 96 %. Body mass index is 45.66 kg/m. Physical Exam  Constitutional: She is oriented to person, place, and time. She appears well-developed and well-nourished.  Cardiovascular: Normal rate.  Pulmonary/Chest: Effort normal.  Musculoskeletal: Normal range of motion.  Neurological: She is oriented to person, place, and time.  Skin: Skin is warm and dry.  Psychiatric: She has Veronica normal mood and affect. Her behavior is normal.  Vitals reviewed.   RECENT LABS AND TESTS: BMET    Component Value Date/Time   NA 142 05/10/2017   K 3.8 05/10/2017   CL 102 09/29/2016 0937   CO2 23 09/29/2016 0937   GLUCOSE 114 (H) 09/29/2016 0937   GLUCOSE 99 09/10/2009 0540   BUN 14 05/10/2017   CREATININE 0.7 05/10/2017   CREATININE 0.74 09/29/2016 0937   CALCIUM 10.2 09/29/2016 0937   GFRNONAA 90 09/29/2016 0937   GFRAA 104 09/29/2016 0937   Lab Results  Component Value Date   HGBA1C 7.8 05/10/2017   HGBA1C 6.6 (H) 09/29/2016   HGBA1C 6.5 (H) 05/09/2016   HGBA1C 6.7 (H) 01/27/2016   HGBA1C 6.9 01/12/2016   Lab Results  Component Value Date   INSULIN 39.0 (H) 09/29/2016   INSULIN 38.5 (H) 05/09/2016   INSULIN 27.3 (H) 01/27/2016   CBC    Component Value Date/Time   WBC 6.8 01/27/2016 1006   WBC 11.1 (H)  09/10/2009 0540   RBC 4.28 01/27/2016 1006   RBC 3.89 09/10/2009 0540   HGB 12.1 01/27/2016 1006   HCT 37.0 01/27/2016 1006   PLT 241 09/10/2009 0540   MCV 86 01/27/2016 1006   MCH 28.3 01/27/2016 1006   MCH 29.0 09/10/2009 0540   MCHC 32.7 01/27/2016 1006   MCHC 33.0 09/10/2009 0540   RDW 13.3 01/27/2016 1006   LYMPHSABS 2.0 01/27/2016 1006   EOSABS 0.2 01/27/2016 1006   BASOSABS 0.0 01/27/2016 1006   Iron/TIBC/Ferritin/ %Sat No results found  for: IRON, TIBC, FERRITIN, IRONPCTSAT Lipid Panel     Component Value Date/Time   CHOL 177 05/10/2017   CHOL 162 09/29/2016 0937   TRIG 182 (Veronica) 05/10/2017   HDL 49 05/10/2017   HDL 52 09/29/2016 0937   LDLCALC 92 05/10/2017   LDLCALC 82 09/29/2016 0937   Hepatic Function Panel     Component Value Date/Time   PROT 7.0 09/29/2016 0937   ALBUMIN 4.3 09/29/2016 0937   AST 19 05/10/2017   ALT 34 05/10/2017   ALKPHOS 79 05/10/2017   BILITOT 0.3 09/29/2016 0937      Component Value Date/Time   TSH 0.52 04/04/2017 0859   TSH 0.436 (L) 09/29/2016 0937   TSH 0.443 (L) 05/09/2016 1005   Results for ARMONI, KLUDT (MRN [id number]) as of 09/20/2017 08:41  Ref. Range 09/29/2016 09:37  Vitamin D, 25-Hydroxy Latest Ref Range: 30.0 - 100.0 ng/mL 53.2   ASSESSMENT AND PLAN: Type 2 diabetes mellitus without complication, without long-term current use of insulin (HCC) - Plan: liraglutide (VICTOZA) 18 MG/3ML SOPN, Insulin Pen Needle (BD PEN NEEDLE NANO U/F) 32G X 4 MM MISC, Lancets (ACCU-CHEK SAFE-T PRO) lancets, glucose blood (ACCU-CHEK GUIDE) test strip  Essential hypertension  At risk for heart disease  Class 3 severe obesity with serious comorbidity and body mass index (BMI) of 45.0 to 49.9 in adult, unspecified obesity type ([place])  PLAN:  Diabetes II Veronica Russell has been given extensive diabetes education by myself today including ideal fasting and post-prandial blood glucose readings, individual ideal Hgb A1c goals and hypoglycemia  prevention. We discussed the importance of good blood sugar control to decrease the likelihood of diabetic complications such as nephropathy, neuropathy, limb loss, blindness, coronary artery disease, and death. We discussed the importance of intensive lifestyle modification including diet, exercise and weight loss as the first line treatment for diabetes. Veronica Russell agrees to increase Victoza to 1.8 mg #3 pens and #30 needles with no refills, we will refill Accu-chek pro lancets #60 and blood glucose strips #60 with no refills. Veronica Russell agrees to continue with medications, diet, exercise and weight loss. Veronica Russell is to speak with her endocrinologist about taking over her diabetes management. Veronica Russell agrees to follow up with our clinic in 2 to 3 weeks..  Hypertension We discussed sodium restriction, working on healthy weight loss, and Veronica regular exercise program as the means to achieve improved blood pressure control. Veronica Russell agreed with this plan and agreed to follow up as directed. We will continue to monitor her blood pressure as well as her progress with the above lifestyle modifications. She will continue her medications as prescribed and will watch for signs of hypotension as she continues her lifestyle modifications.  Cardiovascular risk counseling Veronica Russell was given extended (15 minutes) coronary artery disease prevention counseling today. She is 57 y.o. female and has risk factors for heart disease including obesity, diabetes and hypertension. We discussed intensive lifestyle modifications today with an emphasis on specific weight loss instructions and strategies. Veronica Russell was also informed of the importance of increasing exercise and decreasing saturated fats to help prevent heart disease.  Obesity Veronica Russell is currently in the action stage of change. As such, her goal is to continue with weight loss efforts She has agreed to change to the Category 2 plan +100 calories Veronica Russell has been instructed to work up to Veronica goal of 150  minutes of combined cardio and strengthening exercise per week for weight loss and overall health benefits. We discussed the following Behavioral Modification Strategies today:  decrease eating out and work on meal planning and easy cooking plans  Veronica Russell has agreed to follow up with our clinic in 2 to 3 weeks. She was informed of the importance of frequent follow up visits to maximize her success with intensive lifestyle modifications for her multiple health conditions.   OBESITY BEHAVIORAL INTERVENTION VISIT  Today'Russell visit was # 33  Starting weight: 259 lbs Starting date: 01/27/16 Today'Russell weight : 266 lbs Today'Russell date: 09/19/2017 Total lbs lost to date: 0 At least 15 minutes were spent on discussing the following behavioral intervention visit.   ASK: We discussed the diagnosis of obesity with Veronica Russell today and Kenzy agreed to give [name] permission to discuss obesity behavioral modification therapy today.  ASSESS: Hazelee has the diagnosis of obesity and her BMI today is 45.64 Vernadette is in the action stage of change   ADVISE: Myron was educated on the multiple health risks of obesity as well as the benefit of weight loss to improve her health. She was advised of the need for long term treatment and the importance of lifestyle modifications to improve her current health and to decrease her risk of future health problems.  AGREE: Multiple dietary modification options and treatment options were discussed and  Nandini agreed to follow the recommendations documented in the above note.  ARRANGE: Elynore was educated on the importance of frequent visits to treat obesity as outlined per CMS and USPSTF guidelines and agreed to schedule her next follow up appointment today.  Corey Skains, am acting as transcriptionist for Abby Potash, PA-C I, Abby Potash, PA-C have reviewed above note and agree with its content

## 2017-10-05 ENCOUNTER — Ambulatory Visit: Payer: 59 | Admitting: Internal Medicine

## 2017-10-05 ENCOUNTER — Encounter: Payer: Self-pay | Admitting: Internal Medicine

## 2017-10-05 ENCOUNTER — Ambulatory Visit (INDEPENDENT_AMBULATORY_CARE_PROVIDER_SITE_OTHER): Payer: 59 | Admitting: Physician Assistant

## 2017-10-05 VITALS — BP 113/78 | HR 104 | Temp 98.1°F | Ht 64.0 in | Wt 263.0 lb

## 2017-10-05 VITALS — BP 126/86 | HR 109 | Ht 64.0 in | Wt 266.0 lb

## 2017-10-05 DIAGNOSIS — E119 Type 2 diabetes mellitus without complications: Secondary | ICD-10-CM

## 2017-10-05 DIAGNOSIS — E0590 Thyrotoxicosis, unspecified without thyrotoxic crisis or storm: Secondary | ICD-10-CM | POA: Diagnosis not present

## 2017-10-05 DIAGNOSIS — I10 Essential (primary) hypertension: Secondary | ICD-10-CM | POA: Diagnosis not present

## 2017-10-05 DIAGNOSIS — Z6841 Body Mass Index (BMI) 40.0 and over, adult: Secondary | ICD-10-CM

## 2017-10-05 DIAGNOSIS — Z9189 Other specified personal risk factors, not elsewhere classified: Secondary | ICD-10-CM

## 2017-10-05 DIAGNOSIS — E1165 Type 2 diabetes mellitus with hyperglycemia: Secondary | ICD-10-CM

## 2017-10-05 DIAGNOSIS — E042 Nontoxic multinodular goiter: Secondary | ICD-10-CM | POA: Diagnosis not present

## 2017-10-05 LAB — POCT GLYCOSYLATED HEMOGLOBIN (HGB A1C): Hemoglobin A1C: 7.1 % — AB (ref 4.0–5.6)

## 2017-10-05 LAB — T3, FREE: T3, Free: 3.9 pg/mL (ref 2.3–4.2)

## 2017-10-05 LAB — T4, FREE: Free T4: 0.75 ng/dL (ref 0.60–1.60)

## 2017-10-05 LAB — TSH: TSH: 0.5 u[IU]/mL (ref 0.35–4.50)

## 2017-10-05 MED ORDER — INSULIN PEN NEEDLE 32G X 4 MM MISC: 1.0000 | Freq: Every day | 0 refills | Status: DC

## 2017-10-05 MED ORDER — LIRAGLUTIDE 18 MG/3ML ~~LOC~~ SOPN: 1.8000 mg | PEN_INJECTOR | Freq: Every morning | SUBCUTANEOUS | 0 refills | Status: DC

## 2017-10-05 NOTE — Patient Instructions (Addendum)
Please stop at the lab.  Please continue: - Metformin ER 1000 mg but move it with dinner - Victoza 1.2 mg + 5 clicks  Please stop Januvia.  Read the following books: Dr. Alyssa Grove - Program for Reversing Diabetes Dr. [name] Benjamin - How Not to Die   Please come back for a follow-up appointment in 3 months.

## 2017-10-05 NOTE — Progress Notes (Signed)
Office: [phone]  /  Fax: [phone]   HPI:   Chief Complaint: OBESITY Veronica Russell is here to discuss her progress with her obesity treatment plan. She is on the Category 2 plan + 100 calories and is following her eating plan approximately 70 % of the time. She states she is exercising 0 minutes 0 times per week. [name] did well with weight loss. She had multiple birthday celebrations and reports that she practiced portion control and smarter food choices.  Her weight is 263 lb (119.3 kg) today and has had a weight loss of 3 pounds over a period of 2 weeks since her last visit. She has lost 0 lbs since starting treatment with [name].  Diabetes II Veronica Russell has a diagnosis of diabetes type II. Veronica Russell states fasting BGs range between 126 and 160, and last A1c was 7.1. She is on metformin and Victoza, and she sees Endocrinology. She denies any hypoglycemic episodes. She has been working on intensive lifestyle modifications including diet, exercise, and weight loss to help control her blood glucose levels.  Hypertension Veronica Russell is a 57 y.o. female with hypertension. Veronica Russell's blood pressure is normal. She is on amlodipine and she denies chest pain. She is working weight loss to help control her blood pressure with the goal of decreasing her risk of heart attack and stroke. [name]'s blood pressure is currently controlled.  At risk for cardiovascular disease Veronica Russell is at a higher than average risk for cardiovascular disease due to obesity, diabetes II, and hypertension. She currently denies any chest pain.  ALLERGIES: Allergies  Allergen Reactions  . Milk-Related Compounds     Lactose intollerance    MEDICATIONS: Current Outpatient Medications on File Prior to Visit  Medication Sig Dispense Refill  . albuterol (VENTOLIN HFA) 108 (90 Base) MCG/ACT inhaler Inhale into the lungs every 6 (six) hours as needed for wheezing or shortness of breath.    . Alcohol Swabs (ALCOHOL WIPES) 70 % PADS 30  Packages by Does not apply route 2 (two) times daily. 100 each 0  . amLODipine (NORVASC) 5 MG tablet Take 5 mg by mouth daily.    Marland Kitchen atorvastatin (LIPITOR) 20 MG tablet Take 20 mg by mouth daily.    . calcium-vitamin D (OSCAL WITH D) 500-200 MG-UNIT tablet Take 2 tablets by mouth every morning. 60 tablet 0  . Cholecalciferol 4000 units CAPS Vitamin D3 4,000 unit capsule   1 capsule every day by oral route.    . Clobetasol Propionate 0.05 % lotion Reported on 07/21/2015    . glucose blood (ACCU-CHEK GUIDE) test strip Use twice daily 60 each 0  . hydrochlorothiazide (HYDRODIURIL) 25 MG tablet Take 25 mg by mouth every morning.  1  . Lancets (ACCU-CHEK SAFE-T PRO) lancets Use as instructed 60 each 0  . losartan (COZAAR) 50 MG tablet losartan 50 mg tablet   1 tablet every day by oral route.    Marland Kitchen losartan-hydrochlorothiazide (HYZAAR) 50-12.5 MG tablet Take 1 tablet by mouth daily.    . Melatonin 3 MG TABS Take 1 tablet by mouth at bedtime as needed.    . metFORMIN (GLUCOPHAGE-XR) 500 MG 24 hr tablet Take 1,000 mg by mouth at bedtime.     . montelukast (SINGULAIR) 10 MG tablet Take 10 mg by mouth at bedtime.    Marland Kitchen omeprazole (PRILOSEC) 20 MG capsule TAKE 1 CAPSULE (20 MG TOTAL) BY MOUTH DAILY.  3  . propranolol (INDERAL) 20 MG tablet Take 20 mg by mouth daily.     Marland Kitchen  omeprazole (PRILOSEC) 20 MG capsule Take 1 capsule (20 mg total) by mouth daily. 30 capsule 3   No current facility-administered medications on file prior to visit.     PAST MEDICAL HISTORY: Past Medical History:  Diagnosis Date  . Alopecia   . Asthma   . Back pain   . Diabetes mellitus ([place])   . GERD (gastroesophageal reflux disease)   . Hyperlipidemia   . Hypertension   . Joint pain   . Lactose intolerance   . Obesity   . Raynauds syndrome   . Swelling   . Tremor   . Vitamin D deficiency     PAST SURGICAL HISTORY: Past Surgical History:  Procedure Laterality Date  . ABDOMINAL HYSTERECTOMY    . BREAST BIOPSY    .  MYOMECTOMY      SOCIAL HISTORY: Social History   Tobacco Use  . Smoking status: Never Smoker  . Smokeless tobacco: Never Used  Substance Use Topics  . Alcohol use: No  . Drug use: No    FAMILY HISTORY: Family History  Problem Relation Age of Onset  . Multiple myeloma Mother   . Hypertension Mother   . Hyperlipidemia Mother   . Cancer Mother   . Obesity Mother   . Hypertension Father   . Hyperlipidemia Father   . Cancer Father   . Asthma Other   . Hyperlipidemia Other   . Hypertension Other   . Cancer Other   . COPD Other   . Stroke Other   . Breast cancer Maternal Aunt   . Breast cancer Paternal Aunt   . Breast cancer Maternal Aunt     ROS: Review of Systems  Constitutional: Positive for weight loss.  Cardiovascular: Negative for chest pain.  Endo/Heme/Allergies:       Negative hypoglycemia    PHYSICAL EXAM: Blood pressure 113/78, pulse (!) 104, temperature 98.1 F (36.7 C), temperature source Oral, height 5' 4"  (1.626 m), weight 263 lb (119.3 kg), last menstrual period 08/31/2008, SpO2 96 %. Body mass index is 45.14 kg/m. Physical Exam  Constitutional: She is oriented to person, place, and time. She appears well-developed and well-nourished.  Cardiovascular: Normal rate.  Pulmonary/Chest: Effort normal.  Musculoskeletal: Normal range of motion.  Neurological: She is oriented to person, place, and time.  Skin: Skin is warm and dry.  Psychiatric: She has a normal mood and affect. Her behavior is normal.  Vitals reviewed.   RECENT LABS AND TESTS: BMET    Component Value Date/Time   NA 142 05/10/2017   K 3.8 05/10/2017   CL 102 09/29/2016 0937   CO2 23 09/29/2016 0937   GLUCOSE 114 (H) 09/29/2016 0937   GLUCOSE 99 09/10/2009 0540   BUN 14 05/10/2017   CREATININE 0.7 05/10/2017   CREATININE 0.74 09/29/2016 0937   CALCIUM 10.2 09/29/2016 0937   GFRNONAA 90 09/29/2016 0937   GFRAA 104 09/29/2016 0937   Lab Results  Component Value Date   HGBA1C  7.1 (A) 10/05/2017   HGBA1C 7.8 05/10/2017   HGBA1C 6.6 (H) 09/29/2016   HGBA1C 6.5 (H) 05/09/2016   HGBA1C 6.7 (H) 01/27/2016   Lab Results  Component Value Date   INSULIN 39.0 (H) 09/29/2016   INSULIN 38.5 (H) 05/09/2016   INSULIN 27.3 (H) 01/27/2016   CBC    Component Value Date/Time   WBC 6.8 01/27/2016 1006   WBC 11.1 (H) 09/10/2009 0540   RBC 4.28 01/27/2016 1006   RBC 3.89 09/10/2009 0540   HGB 12.1  01/27/2016 1006   HCT 37.0 01/27/2016 1006   PLT 241 09/10/2009 0540   MCV 86 01/27/2016 1006   MCH 28.3 01/27/2016 1006   MCH 29.0 09/10/2009 0540   MCHC 32.7 01/27/2016 1006   MCHC 33.0 09/10/2009 0540   RDW 13.3 01/27/2016 1006   LYMPHSABS 2.0 01/27/2016 1006   EOSABS 0.2 01/27/2016 1006   BASOSABS 0.0 01/27/2016 1006   Iron/TIBC/Ferritin/ %Sat No results found for: IRON, TIBC, FERRITIN, IRONPCTSAT Lipid Panel     Component Value Date/Time   CHOL 177 05/10/2017   CHOL 162 09/29/2016 0937   TRIG 182 (A) 05/10/2017   HDL 49 05/10/2017   HDL 52 09/29/2016 0937   LDLCALC 92 05/10/2017   LDLCALC 82 09/29/2016 0937   Hepatic Function Panel     Component Value Date/Time   PROT 7.0 09/29/2016 0937   ALBUMIN 4.3 09/29/2016 0937   AST 19 05/10/2017   ALT 34 05/10/2017   ALKPHOS 79 05/10/2017   BILITOT 0.3 09/29/2016 0937      Component Value Date/Time   TSH 0.50 10/05/2017 0956   TSH 0.52 04/04/2017 0859   TSH 0.436 (L) 09/29/2016 0937    ASSESSMENT AND PLAN: Type 2 diabetes mellitus without complication, without long-term current use of insulin (HCC) - Plan: liraglutide (VICTOZA) 18 MG/3ML SOPN, Insulin Pen Needle (BD PEN NEEDLE NANO U/F) 32G X 4 MM MISC  Essential hypertension  At risk for heart disease  Class 3 severe obesity with serious comorbidity and body mass index (BMI) of 45.0 to 49.9 in adult, unspecified obesity type ([place])  PLAN:  Diabetes II Ryver has been given extensive diabetes education by myself today including ideal fasting and  post-prandial blood glucose readings, individual ideal Hgb A1c goals and hypoglycemia prevention. We discussed the importance of good blood sugar control to decrease the likelihood of diabetic complications such as nephropathy, neuropathy, limb loss, blindness, coronary artery disease, and death. We discussed the importance of intensive lifestyle modification including diet, exercise and weight loss as the first line treatment for diabetes. [place] agrees to continue Victoza 1.8 mg q AM #3 pens and we will refill for 1 month, and we will refill nano needles #30 for 1 month. She will continue taking metformin, and she will continue diet and weight loss. Anjalina agrees to follow up with our clinic in 2 to 3 weeks.  Hypertension We discussed sodium restriction, working on healthy weight loss, and a regular exercise program as the means to achieve improved blood pressure control. Renelda agreed with this plan and agreed to follow up as directed. We will continue to monitor her blood pressure as well as her progress with the above lifestyle modifications. Jerae agrees to continue her medications, diet, and weight loss, and will watch for signs of hypotension as she continues her lifestyle modifications. Dallys agrees to follow up with our clinic in 2 to 3 weeks.  Cardiovascular risk counselling Damita was given extended (15 minutes) coronary artery disease prevention counseling today. She is 57 y.o. female and has risk factors for heart disease including obesity, diabetes II, and hypertension. We discussed intensive lifestyle modifications today with an emphasis on specific weight loss instructions and strategies. Pt was also informed of the importance of increasing exercise and decreasing saturated fats to help prevent heart disease.  Obesity Miia is currently in the action stage of change. As such, her goal is to continue with weight loss efforts She has agreed to follow the Category 2 plan + 100 calories  Henry  has been instructed to work up to a goal of 150 minutes of combined cardio and strengthening exercise per week for weight loss and overall health benefits. We discussed the following Behavioral Modification Strategies today: work on meal planning and easy cooking plans and planning for success   Marry has agreed to follow up with our clinic in 2 to 3 weeks. She was informed of the importance of frequent follow up visits to maximize her success with intensive lifestyle modifications for her multiple health conditions.   OBESITY BEHAVIORAL INTERVENTION VISIT  Today's visit was # 34.  Starting weight: 259 lbs Starting date: 01/27/16 Today's weight : 263 lbs  Today's date: 10/05/2017 Total lbs lost to date: 0    ASK: We discussed the diagnosis of obesity with [name] today and [name] agreed to give [name] permission to discuss obesity behavioral modification therapy today.  ASSESS: Kaymarie has the diagnosis of obesity and her BMI today is 45.12 Hiba is in the action stage of change   ADVISE: Alzada was educated on the multiple health risks of obesity as well as the benefit of weight loss to improve her health. She was advised of the need for long term treatment and the importance of lifestyle modifications.  AGREE: Multiple dietary modification options and treatment options were discussed and  Kataleena agreed to the above obesity treatment plan.  Wilhemena Durie, am acting as transcriptionist for Abby Potash, PA-C I, Abby Potash, PA-C have reviewed above note and agree with its content

## 2017-10-05 NOTE — Progress Notes (Addendum)
Patient ID: Veronica Russell, female   DOB: [birth year], 57 y.o.   MRN: [id number]    HPI  Veronica Russell is a 57 y.o.-year-old female, returning for follow-up for h/o a low TSH, left thyroid nodule, and DM2 non-insulin-dependent.  Last visit 6 months ago.  Low TSH: Patient was found to have a slightly low TSH at her first visit in the weight loss clinic in 01/2016.  The TFTs were very stable since then, with a TSH just under the lower limit of normal, and free T4 normal  Reviewed patient's TFTs: Lab Results  Component Value Date   TSH 0.52 04/04/2017   TSH 0.436 (L) 09/29/2016   TSH 0.443 (L) 05/09/2016   TSH 0.425 (L) 01/27/2016   FREET4 0.75 04/04/2017   FREET4 1.17 09/29/2016   FREET4 1.24 05/09/2016   FREET4 1.17 01/27/2016    Lab Results  Component Value Date   T3FREE 3.7 04/04/2017    She continues to experience: Hot flashes, chronic tremors (hereditary-in father), palpitations, hair loss (chronic) but no fatigue, hyper defecation, weight loss, anxiety  Pt does have a FH of thyroid ds. - M aunt. No FH of thyroid cancer.  No h/o radiation tx to head or neck.  No seaweed or kelp. No recent contrast studies. No herbal supplements. No Biotin use. No recent steroids use.   Left thyroid nodule: At last visit, we checked a thyroid ultrasound:  04/14/2017: 2.9 x 2 x 2.7 solid, hypoechoic, left thyroid nodule 05/11/2017: FNA of the nodule: Benign  Pt denies: - feeling nodules in neck - hoarseness - dysphagia - choking - SOB with lying down  DM2: -She would also want me to address this today Lab Results  Component Value Date   HGBA1C 7.8 05/10/2017   She is on: - Metformin ER 1000 mg at bedtime - Januvia 100 mg in a.m. - Victoza 1.2 mg + 5 clicks in am (could not tolerate 1.8 mg b/c diarrhea)  She checks her sugars 2x a day per review of her log: - am: 126-159 - 2h after b'fast: n/c - lunch: n/c - 2h after lunch: n/c - dinner: n/c - 2h after dinner: 103-280 -  bedtime: n/c Lowest: 103; hypoglycemia awareness at 70 Highest: 280 (tea)  Meter: Accu-Chek  Drinks half-and half tea, occasional Coca-Cola.  No CKD: Lab Results  Component Value Date   BUN 14 05/10/2017   Lab Results  Component Value Date   CREATININE 0.7 05/10/2017  On Losartan/  + HL: Lab Results  Component Value Date   CHOL 177 05/10/2017   HDL 49 05/10/2017   LDLCALC 92 05/10/2017   TRIG 182 (A) 05/10/2017  On Lipitor 20.  Last eye exam: 08/2017: No DR. + glaucoma.  She also has HTN, asthma, vitamin D deficiency.  ROS: Constitutional: + weight gain/no weight loss, no fatigue, no subjective hyperthermia, no subjective hypothermia Eyes: no blurry vision, no xerophthalmia ENT: no sore throat, + see HPI Cardiovascular: no CP/no SOB/no palpitations/+ leg swelling Respiratory: no cough/no SOB/no wheezing Gastrointestinal: no N/no V/no D/no C/no acid reflux Musculoskeletal: no muscle aches/no joint aches Skin: no rashes, no hair loss Neurological: + tremors/no numbness/no tingling/no dizziness  I reviewed pt's medications, allergies, PMH, social hx, family hx, and changes were documented in the history of present illness. Otherwise, unchanged from my initial visit note.  Past Medical History:  Diagnosis Date  . Alopecia   . Asthma   . Back pain   . Diabetes mellitus ([place])   .  GERD (gastroesophageal reflux disease)   . Hyperlipidemia   . Hypertension   . Joint pain   . Lactose intolerance   . Obesity   . Raynauds syndrome   . Swelling   . Tremor   . Vitamin D deficiency    Past Surgical History:  Procedure Laterality Date  . ABDOMINAL HYSTERECTOMY    . BREAST BIOPSY    . MYOMECTOMY     Social History   Socioeconomic History  . Marital status: Single    Spouse name: Not on file  . Number of children: no  Social Needs  Occupational History  . Occupation: [employer]    Employer: [employer]  Tobacco Use  . Smoking status: Never Smoker  .  Smokeless tobacco: Never Used  Substance and Sexual Activity  . Alcohol use:  Wine, 2-3 times a year, 1 drink  . Drug use: No   Current Outpatient Medications on File Prior to Visit  Medication Sig Dispense Refill  . albuterol (VENTOLIN HFA) 108 (90 Base) MCG/ACT inhaler Inhale into the lungs every 6 (six) hours as needed for wheezing or shortness of breath.    . Alcohol Swabs (ALCOHOL WIPES) 70 % PADS 30 Packages by Does not apply route 2 (two) times daily. 100 each 0  . amLODipine (NORVASC) 5 MG tablet Take 5 mg by mouth daily.    Marland Kitchen atorvastatin (LIPITOR) 20 MG tablet Take 20 mg by mouth daily.    . calcium-vitamin D (OSCAL WITH D) 500-200 MG-UNIT tablet Take 2 tablets by mouth every morning. 60 tablet 0  . Clobetasol Propionate 0.05 % lotion Reported on 07/21/2015    . glucose blood (ACCU-CHEK GUIDE) test strip Use twice daily 60 each 0  . Insulin Pen Needle (BD PEN NEEDLE NANO U/F) 32G X 4 MM MISC 1 Package by Does not apply route daily. 30 each 0  . Lancets (ACCU-CHEK SAFE-T PRO) lancets Use as instructed 60 each 0  . liraglutide (VICTOZA) 18 MG/3ML SOPN Inject 0.3 mLs (1.8 mg total) into the skin every morning. 3 pen 0  . losartan-hydrochlorothiazide (HYZAAR) 50-12.5 MG tablet Take 1 tablet by mouth daily.    . Melatonin 3 MG TABS Take 1 tablet by mouth at bedtime as needed.    . metFORMIN (GLUCOPHAGE-XR) 500 MG 24 hr tablet Take 1,000 mg by mouth at bedtime.     . montelukast (SINGULAIR) 10 MG tablet Take 10 mg by mouth at bedtime.    Marland Kitchen omeprazole (PRILOSEC) 20 MG capsule Take 1 capsule (20 mg total) by mouth daily. 30 capsule 3  . propranolol (INDERAL) 20 MG tablet Take 20 mg by mouth daily.     . sitaGLIPtin (JANUVIA) 50 MG tablet Take 100 mg by mouth daily.      No current facility-administered medications on file prior to visit.    Allergies  Allergen Reactions  . Milk-Related Compounds     Lactose intollerance   Family History  Problem Relation Age of Onset  . Multiple  myeloma Mother   . Hypertension Mother   . Hyperlipidemia Mother   . Cancer Mother   . Obesity Mother   . Hypertension Father   . Hyperlipidemia Father   . Cancer Father   . Asthma Other   . Hyperlipidemia Other   . Hypertension Other   . Cancer Other   . COPD Other   . Stroke Other   . Breast cancer Maternal Aunt   . Breast cancer Paternal Aunt   .  Breast cancer Maternal Aunt     PE: BP 126/86 (BP Location: Left Arm, Patient Position: Sitting, Cuff Size: Normal)   Pulse (!) 109   Ht 5\' 4"  (1.626 m)   Wt 266 lb (120.7 kg)   LMP 08/31/2008   SpO2 96%   BMI 45.66 kg/m  Wt Readings from Last 3 Encounters:  10/05/17 266 lb (120.7 kg)  09/19/17 266 lb (120.7 kg)  08/21/17 261 lb (118.4 kg)   Constitutional: overweight, in NAD Eyes: PERRLA, EOMI, no exophthalmos ENT: moist mucous membranes, no thyromegaly, + ~1 cm L thyroid nodule - mobile, no cervical lymphadenopathy Cardiovascular: RRR, No MRG Respiratory: CTA B Gastrointestinal: abdomen soft, NT, ND, BS+ Musculoskeletal: no deformities, strength intact in all 4 Skin: moist, warm, no rashes Neurological: no tremor with outstretched hands, DTR normal in all 4  ASSESSMENT: 1.  Mild subclinical thyrotoxicosis  2.  left thyroid nodule Thyroid ultrasound: FINDINGS: Parenchymal Echotexture: Mildly heterogenous Isthmus: 0.5 cm Right lobe: 4.5 x 2.1 x 1.3 cm Left lobe: 4.3 x 1.4 x 1.6 cm _________________________________________________________  Nodule # 1: Location: Right; Inferior Maximum size: 2.9 cm; Other 2 dimensions: 2.0 x 2.1 cm Composition: solid/almost completely solid (2) Echogenicity: hypoechoic (2) Shape: not taller-than-wide (0) Margins: smooth (0) Echogenic foci: none (0) ACR TI-RADS total points: 4. ACR TI-RADS risk category: TR4 (4-6 points).  ACR TI-RADS recommendations: **Given size (>/= 1.5 cm) and appearance, fine needle aspiration of this moderately suspicious nodule should be considered  based on TI-RADS criteria.________________________________________________  Multiple other smaller nodules measure 0.7 cm or less in size and do not meet criteria for biopsy nor follow-up.  IMPRESSION: Right lower pole nodule 1 meets criteria for fine needle aspiration biopsy.  Electronically Signed By: Marybelle Killings M.D. On: 04/14/2017 08:17   FNA: Adequacy Reason Satisfactory For Evaluation. Diagnosis THYROID, FINE NEEDLE ASPIRATION, RLP (SPECIMEN 1 OF 1,COLLECTED 05/11/17): CONSISTENT WITH BENIGN FOLLICULAR NODULE ([place] CATEGORY II). Enid Cutter MD Pathologist, Electronic Signature (Case signed 05/15/2017) Specimen Clinical Information Right Inferior, 2.9cm; Other 2 dimensions: 2.0 x 2.1cm, solid/almost completely solid, hypoechoic, TI-RADS total points - 4, Moderately suspicious nodule Source Thyroid, Fine Needle Aspiration, Right, RLP (Specimen 1 of 1, collected on 05/11/17)  PLAN:  1. Patient with history of a slightly low TSH, with normal free T4 and free T3, with some thyrotoxic symptoms: Hot flashes (but menopausal), palpitations, hair loss.  No weight loss, anxiety, hyper defecation.  At last visit, we rechecked her thyroid tests and they were all normal (03/2017).   -At today's visit, she does not have any new symptoms suspicious for hyperthyroidism -We will continue to keep an eye on her TFTs and if they start to decrease, she may need a thyroid uptake and scan. -We will check her TFTs today  2. L thyroid nodule -Felt on palpation -Denies neck compression symptoms -Reviewed together her most recent thyroid ultrasound (03/2017) results that showed 2.9 cm thyroid nodule.  This was hypoechoic and solid, therefore, meeting criteria for biopsy.  A biopsy performed 05/2017 was benign. -We will continue to follow the nodule but no intervention is needed for now.  3. DM2 - new pb for me - She is currently on metformin, DPP 4 inhibitor, and Victoza.  We discussed  that Victoza is much stronger than the DPP 4 inhibitor and they are in the same class.  We will stop Januvia. -We discussed about the importance of controlling diet and I made specific suggestions.  Also given written references.  Highly recommended  to stop drinking carb rich beverages. - We also discussed about targets for CBGs before and after meals and for the HbA1c. - Most recent HbA1c was 7.8% 4 months ago. Today, HbA1c is 7.1% (improved) - continue checking sugars at different times of the day - check 1x a day, rotating checks  - given foot care handout and explained the principles  - given instructions for hypoglycemia management "15-15 rule"  - advised for yearly eye exams -she is up-to-date - Return to clinic in 3 mo with sugar log   - time spent with the patient: 40 minutes, of which >50% was spent in obtaining information about her symptoms, reviewing her previous labs, evaluations, and treatments, counseling her about her endocrine conditions including her diabetes (please see the discussed topics above), and developing a plan to further investigate and treat them; she had a number of questions which I addressed.  Component     Latest Ref Rng & Units 10/05/2017  T4,Free(Direct)     0.60 - 1.60 ng/dL 0.75  TSH     0.35 - 4.50 uIU/mL 0.50  Triiodothyronine,Free,Serum     2.3 - 4.2 pg/mL 3.9  Normal TFTs.  Philemon Kingdom, MD PhD [facility]

## 2017-10-24 MED FILL — VICTOZA 18 MG/3 ML INJECT P: 18 | 30 days supply | Qty: 9 | Fill #0

## 2017-10-26 ENCOUNTER — Ambulatory Visit (INDEPENDENT_AMBULATORY_CARE_PROVIDER_SITE_OTHER): Payer: 59 | Admitting: Physician Assistant

## 2017-10-26 VITALS — BP 129/88 | HR 96 | Temp 98.1°F | Ht 64.0 in | Wt 264.0 lb

## 2017-10-26 DIAGNOSIS — Z6841 Body Mass Index (BMI) 40.0 and over, adult: Secondary | ICD-10-CM

## 2017-10-26 DIAGNOSIS — E7849 Other hyperlipidemia: Secondary | ICD-10-CM

## 2017-10-26 DIAGNOSIS — E559 Vitamin D deficiency, unspecified: Secondary | ICD-10-CM | POA: Diagnosis not present

## 2017-10-26 DIAGNOSIS — Z9189 Other specified personal risk factors, not elsewhere classified: Secondary | ICD-10-CM

## 2017-10-26 DIAGNOSIS — E119 Type 2 diabetes mellitus without complications: Secondary | ICD-10-CM

## 2017-10-26 NOTE — Progress Notes (Signed)
Office: [phone]  /  Fax: [phone]   HPI:   Chief Complaint: OBESITY Veronica Russell is here to discuss her progress with her obesity treatment plan. She is on the Category 2 plan +100 calories and is following her eating plan approximately 70 % of the time. She states she is exercising 0 minutes 0 times per week. Honesty struggled to follow the meal plan over the last few weeks due to travel. She also reports that she has not gotten all of the food in at dinner. Veronica Russell is ready to get back on track. Her weight is 264 lb (119.7 kg) today and has not lost weight since her last visit. She has lost 0 lbs since starting treatment with [name].  Diabetes II Veronica Russell has a diagnosis of diabetes type II and she is on Victoza 1.5 mg and Metformin. She denies nausea, vomiting or diarrhea. Veronica Russell states fasting BGs range between 127 and 173 and denies any hypoglycemic episodes or polyphagia. Last A1c was at 7.1 She has been working on intensive lifestyle modifications including diet, exercise, and weight loss to help control her blood glucose levels.  Hyperlipidemia Veronica Russell has hyperlipidemia and she is on Atorvastatin currently. She has been trying to improve her cholesterol levels with intensive lifestyle modification including a low saturated fat diet, exercise and weight loss. She denies any chest pain.  At risk for cardiovascular disease Veronica Russell is at a higher than average risk for cardiovascular disease due to obesity, diabetes and hyperlipidemia. She currently denies any chest pain.  Vitamin D deficiency Veronica Russell has a diagnosis of vitamin D deficiency. She is currently taking OTC vit D and denies nausea, vomiting or muscle weakness.  ALLERGIES: Allergies  Allergen Reactions  . Milk-Related Compounds     Lactose intollerance    MEDICATIONS: Current Outpatient Medications on File Prior to Visit  Medication Sig Dispense Refill  . albuterol (VENTOLIN HFA) 108 (90 Base) MCG/ACT inhaler Inhale into the lungs  every 6 (six) hours as needed for wheezing or shortness of breath.    . Alcohol Swabs (ALCOHOL WIPES) 70 % PADS 30 Packages by Does not apply route 2 (two) times daily. 100 each 0  . amLODipine (NORVASC) 5 MG tablet Take 5 mg by mouth daily.    Marland Kitchen atorvastatin (LIPITOR) 20 MG tablet Take 20 mg by mouth daily.    . calcium-vitamin D (OSCAL WITH D) 500-200 MG-UNIT tablet Take 2 tablets by mouth every morning. 60 tablet 0  . Cholecalciferol 4000 units CAPS Vitamin D3 4,000 unit capsule   1 capsule every day by oral route.    . Clobetasol Propionate 0.05 % lotion Reported on 07/21/2015    . glucose blood (ACCU-CHEK GUIDE) test strip Use twice daily 60 each 0  . hydrochlorothiazide (HYDRODIURIL) 25 MG tablet Take 25 mg by mouth every morning.  1  . Insulin Pen Needle (BD PEN NEEDLE NANO U/F) 32G X 4 MM MISC 1 Package by Does not apply route daily. 30 each 0  . Lancets (ACCU-CHEK SAFE-T PRO) lancets Use as instructed 60 each 0  . liraglutide (VICTOZA) 18 MG/3ML SOPN Inject 0.3 mLs (1.8 mg total) into the skin every morning. 3 pen 0  . losartan (COZAAR) 50 MG tablet losartan 50 mg tablet   1 tablet every day by oral route.    Marland Kitchen losartan-hydrochlorothiazide (HYZAAR) 50-12.5 MG tablet Take 1 tablet by mouth daily.    . Melatonin 3 MG TABS Take 1 tablet by mouth at bedtime as needed.    Marland Kitchen  metFORMIN (GLUCOPHAGE-XR) 500 MG 24 hr tablet Take 1,000 mg by mouth at bedtime.     . montelukast (SINGULAIR) 10 MG tablet Take 10 mg by mouth at bedtime.    Marland Kitchen omeprazole (PRILOSEC) 20 MG capsule TAKE 1 CAPSULE (20 MG TOTAL) BY MOUTH DAILY.  3  . propranolol (INDERAL) 20 MG tablet Take 20 mg by mouth daily.     Marland Kitchen omeprazole (PRILOSEC) 20 MG capsule Take 1 capsule (20 mg total) by mouth daily. 30 capsule 3   No current facility-administered medications on file prior to visit.     PAST MEDICAL HISTORY: Past Medical History:  Diagnosis Date  . Alopecia   . Asthma   . Back pain   . Diabetes mellitus ([place])   . GERD  (gastroesophageal reflux disease)   . Hyperlipidemia   . Hypertension   . Joint pain   . Lactose intolerance   . Obesity   . Raynauds syndrome   . Swelling   . Tremor   . Vitamin D deficiency     PAST SURGICAL HISTORY: Past Surgical History:  Procedure Laterality Date  . ABDOMINAL HYSTERECTOMY    . BREAST BIOPSY    . MYOMECTOMY      SOCIAL HISTORY: Social History   Tobacco Use  . Smoking status: Never Smoker  . Smokeless tobacco: Never Used  Substance Use Topics  . Alcohol use: No  . Drug use: No    FAMILY HISTORY: Family History  Problem Relation Age of Onset  . Multiple myeloma Mother   . Hypertension Mother   . Hyperlipidemia Mother   . Cancer Mother   . Obesity Mother   . Hypertension Father   . Hyperlipidemia Father   . Cancer Father   . Asthma Other   . Hyperlipidemia Other   . Hypertension Other   . Cancer Other   . COPD Other   . Stroke Other   . Breast cancer Maternal Aunt   . Breast cancer Paternal Aunt   . Breast cancer Maternal Aunt     ROS: Review of Systems  Constitutional: Negative for weight loss.  Cardiovascular: Negative for chest pain.  Gastrointestinal: Negative for diarrhea, nausea and vomiting.  Musculoskeletal:       Negative for muscle weakness  Endo/Heme/Allergies:       Negative for polyphagia Negative for hypoglycemia    PHYSICAL EXAM: Blood pressure 129/88, pulse 96, temperature 98.1 F (36.7 C), temperature source Oral, height _0  (1.626 m), weight 264 lb (119.7 kg), last menstrual period 08/31/2008, SpO2 96 %. Body mass index is 45.32 kg/m. Physical Exam  Constitutional: She is oriented to person, place, and time. She appears well-developed and well-nourished.  Cardiovascular: Normal rate.  Pulmonary/Chest: Effort normal.  Musculoskeletal: Normal range of motion.  Neurological: She is oriented to person, place, and time.  Skin: Skin is warm and dry.  Psychiatric: She has a normal mood and affect. Her behavior  is normal.  Vitals reviewed.   RECENT LABS AND TESTS: BMET    Component Value Date/Time   NA 142 05/10/2017   K 3.8 05/10/2017   CL 102 09/29/2016 0937   CO2 23 09/29/2016 0937   GLUCOSE 114 (H) 09/29/2016 0937   GLUCOSE 99 09/10/2009 0540   BUN 14 05/10/2017   CREATININE 0.7 05/10/2017   CREATININE 0.74 09/29/2016 0937   CALCIUM 10.2 09/29/2016 0937   GFRNONAA 90 09/29/2016 0937   GFRAA 104 09/29/2016 0937   Lab Results  Component Value Date  HGBA1C 7.1 (A) 10/05/2017   HGBA1C 7.8 05/10/2017   HGBA1C 6.6 (H) 09/29/2016   HGBA1C 6.5 (H) 05/09/2016   HGBA1C 6.7 (H) 01/27/2016   Lab Results  Component Value Date   INSULIN 39.0 (H) 09/29/2016   INSULIN 38.5 (H) 05/09/2016   INSULIN 27.3 (H) 01/27/2016   CBC    Component Value Date/Time   WBC 6.8 01/27/2016 1006   WBC 11.1 (H) 09/10/2009 0540   RBC 4.28 01/27/2016 1006   RBC 3.89 09/10/2009 0540   HGB 12.1 01/27/2016 1006   HCT 37.0 01/27/2016 1006   PLT 241 09/10/2009 0540   MCV 86 01/27/2016 1006   MCH 28.3 01/27/2016 1006   MCH 29.0 09/10/2009 0540   MCHC 32.7 01/27/2016 1006   MCHC 33.0 09/10/2009 0540   RDW 13.3 01/27/2016 1006   LYMPHSABS 2.0 01/27/2016 1006   EOSABS 0.2 01/27/2016 1006   BASOSABS 0.0 01/27/2016 1006   Iron/TIBC/Ferritin/ %Sat No results found for: IRON, TIBC, FERRITIN, IRONPCTSAT Lipid Panel     Component Value Date/Time   CHOL 177 05/10/2017   CHOL 162 09/29/2016 0937   TRIG 182 (A) 05/10/2017   HDL 49 05/10/2017   HDL 52 09/29/2016 0937   LDLCALC 92 05/10/2017   LDLCALC 82 09/29/2016 0937   Hepatic Function Panel     Component Value Date/Time   PROT 7.0 09/29/2016 0937   ALBUMIN 4.3 09/29/2016 0937   AST 19 05/10/2017   ALT 34 05/10/2017   ALKPHOS 79 05/10/2017   BILITOT 0.3 09/29/2016 0937      Component Value Date/Time   TSH 0.50 10/05/2017 0956   TSH 0.52 04/04/2017 0859   TSH 0.436 (L) 09/29/2016 0937   Results for HALIMAH, BEWICK (MRN [id number]) as of  10/26/2017 10:38  Ref. Range 09/29/2016 09:37  Vitamin D, 25-Hydroxy Latest Ref Range: 30.0 - 100.0 ng/mL 53.2   ASSESSMENT AND PLAN: Type 2 diabetes mellitus without complication, without long-term current use of insulin (HCC) - Plan: Comprehensive metabolic panel, Hemoglobin A1c, Insulin, random  Other hyperlipidemia - Plan: Lipid Panel With LDL/HDL Ratio  Vitamin D deficiency - Plan: VITAMIN D 25 Hydroxy (Vit-D Deficiency, Fractures)  At risk for heart disease  Class 3 severe obesity with serious comorbidity and body mass index (BMI) of 45.0 to 49.9 in adult, unspecified obesity type ([place])  PLAN:  Diabetes II Noely has been given extensive diabetes education by myself today including ideal fasting and post-prandial blood glucose readings, individual ideal Hgb A1c goals and hypoglycemia prevention. We discussed the importance of good blood sugar control to decrease the likelihood of diabetic complications such as nephropathy, neuropathy, limb loss, blindness, coronary artery disease, and death. We discussed the importance of intensive lifestyle modification including diet, exercise and weight loss as the first line treatment for diabetes. Suzie agrees to continue her diabetes medications, exercise and weight loss and she will follow up at the agreed upon time.  Hyperlipidemia Wallace was informed of the American Heart Association Guidelines emphasizing intensive lifestyle modifications as the first line treatment for hyperlipidemia. We discussed many lifestyle modifications today in depth, and Tija will continue to work on decreasing saturated fats such as fatty red meat, butter and many fried foods. She will also increase vegetables and lean protein in her diet and continue to work on exercise and weight loss efforts. Kristyana will continue Atorvastatin and we will check labs today.  Cardiovascular risk counseling Tiara was given extended (15 minutes) coronary artery disease prevention  counseling today.  She is 57 y.o. female and has risk factors for heart disease including obesity, diabetes and hyperlipidemia. We discussed intensive lifestyle modifications today with an emphasis on specific weight loss instructions and strategies. Pt was also informed of the importance of increasing exercise and decreasing saturated fats to help prevent heart disease.  Vitamin D Deficiency Mickala was informed that low vitamin D levels contributes to fatigue and are associated with obesity, breast, and colon cancer. She agrees to continue to take OTC Vit D _0 ,000 IU and will follow up for routine testing of vitamin D, at least 2-3 times per year. She was informed of the risk of over-replacement of vitamin D and agrees to not increase her dose unless she discusses this with [name] first. We will check labs and Patrisia will follow up as directed.  Obesity Ilse is currently in the action stage of change. As such, her goal is to continue with weight loss efforts She has agreed to follow the Category 2 plan +100 calories Minnetta has been instructed to work up to a goal of 150 minutes of combined cardio and strengthening exercise per week for weight loss and overall health benefits. We discussed the following Behavioral Modification Strategies today: no skipping meals, increasing lean protein intake and work on meal planning and easy cooking plans  Tannis has agreed to follow up with our clinic in 3 weeks. She was informed of the importance of frequent follow up visits to maximize her success with intensive lifestyle modifications for her multiple health conditions.   OBESITY BEHAVIORAL INTERVENTION VISIT  Today's visit was # 35  Starting weight: 259 lbs Starting date: 01/27/16 Today's weight : 264 lbs  Today's date: 10/26/2017 Total lbs lost to date: 0   ASK: We discussed the diagnosis of obesity with [name] today and [name] agreed to give [name] permission to discuss obesity behavioral  modification therapy today.  ASSESS: Avian has the diagnosis of obesity and her BMI today is 45.29 Caylea is in the action stage of change   ADVISE: Analeah was educated on the multiple health risks of obesity as well as the benefit of weight loss to improve her health. She was advised of the need for long term treatment and the importance of lifestyle modifications to improve her current health and to decrease her risk of future health problems.  AGREE: Multiple dietary modification options and treatment options were discussed and  Vinaya agreed to follow the recommendations documented in the above note.  ARRANGE: Vickee was educated on the importance of frequent visits to treat obesity as outlined per CMS and USPSTF guidelines and agreed to schedule her next follow up appointment today.  Corey Skains, am acting as transcriptionist for Abby Potash, PA-C I, Abby Potash, PA-C have reviewed above note and agree with its content

## 2017-10-27 LAB — LIPID PANEL WITH LDL/HDL RATIO
CHOLESTEROL TOTAL: 162 mg/dL (ref 100–199)
HDL: 55 mg/dL (ref >39)
LDL Calculated: 71 mg/dL (ref 0–99)
LDl/HDL Ratio: 1.3 ratio (ref 0.0–3.2)
Triglycerides: 178 mg/dL — ABNORMAL HIGH (ref 0–149)
VLDL CHOLESTEROL CAL: 36 mg/dL (ref 5–40)

## 2017-10-27 LAB — COMPREHENSIVE METABOLIC PANEL
ALBUMIN: 4.1 g/dL (ref 3.5–5.5)
ALT: 35 IU/L — AB (ref 0–32)
AST: 18 IU/L (ref 0–40)
Albumin/Globulin Ratio: 1.4 (ref 1.2–2.2)
Alkaline Phosphatase: 101 IU/L (ref 39–117)
BUN/Creatinine Ratio: 20 (ref 9–23)
BUN: 16 mg/dL (ref 6–24)
Bilirubin Total: 0.3 mg/dL (ref 0.0–1.2)
CALCIUM: 9.9 mg/dL (ref 8.7–10.2)
CHLORIDE: 101 mmol/L (ref 96–106)
CO2: 25 mmol/L (ref 20–29)
Creatinine, Ser: 0.82 mg/dL (ref 0.57–1.00)
GFR calc Af Amer: 91 mL/min/{1.73_m2} (ref >59)
GFR calc non Af Amer: 79 mL/min/{1.73_m2} (ref >59)
GLUCOSE: 152 mg/dL — AB (ref 65–99)
Globulin, Total: 2.9 g/dL (ref 1.5–4.5)
POTASSIUM: 3.9 mmol/L (ref 3.5–5.2)
Sodium: 145 mmol/L — ABNORMAL HIGH (ref 134–144)
TOTAL PROTEIN: 7 g/dL (ref 6.0–8.5)

## 2017-10-27 LAB — HEMOGLOBIN A1C
Est. average glucose Bld gHb Est-mCnc: 166 mg/dL
Hgb A1c MFr Bld: 7.4 % — ABNORMAL HIGH (ref 4.8–5.6)

## 2017-10-27 LAB — INSULIN, RANDOM: INSULIN: 56.5 u[IU]/mL — AB (ref 2.6–24.9)

## 2017-10-27 LAB — VITAMIN D 25 HYDROXY (VIT D DEFICIENCY, FRACTURES): Vit D, 25-Hydroxy: 50.8 ng/mL (ref 30.0–100.0)

## 2017-11-07 MED FILL — ATORVASTATIN CALCIUM 20 MG: 20 | 90 days supply | Qty: 90 | Fill #1

## 2017-11-07 MED FILL — METFORMIN HCL ER 500 MG TAB: 500 | 90 days supply | Qty: 180 | Fill #0

## 2017-11-15 DIAGNOSIS — K219 Gastro-esophageal reflux disease without esophagitis: Secondary | ICD-10-CM | POA: Diagnosis not present

## 2017-11-15 DIAGNOSIS — E119 Type 2 diabetes mellitus without complications: Secondary | ICD-10-CM | POA: Diagnosis not present

## 2017-11-15 DIAGNOSIS — E7800 Pure hypercholesterolemia, unspecified: Secondary | ICD-10-CM | POA: Diagnosis not present

## 2017-11-15 DIAGNOSIS — I10 Essential (primary) hypertension: Secondary | ICD-10-CM | POA: Diagnosis not present

## 2017-11-15 DIAGNOSIS — Z0000 Encounter for general adult medical examination without abnormal findings: Secondary | ICD-10-CM | POA: Diagnosis not present

## 2017-11-16 ENCOUNTER — Encounter (INDEPENDENT_AMBULATORY_CARE_PROVIDER_SITE_OTHER): Payer: Self-pay | Admitting: Physician Assistant

## 2017-11-16 ENCOUNTER — Ambulatory Visit (INDEPENDENT_AMBULATORY_CARE_PROVIDER_SITE_OTHER): Payer: 59 | Admitting: Physician Assistant

## 2017-11-16 VITALS — BP 118/81 | HR 109 | Temp 98.3°F | Ht 64.0 in | Wt 263.0 lb

## 2017-11-16 DIAGNOSIS — E119 Type 2 diabetes mellitus without complications: Secondary | ICD-10-CM | POA: Diagnosis not present

## 2017-11-16 DIAGNOSIS — Z6841 Body Mass Index (BMI) 40.0 and over, adult: Secondary | ICD-10-CM

## 2017-11-21 NOTE — Progress Notes (Signed)
Office: [phone]  /  Fax: [phone]   HPI:   Chief Complaint: OBESITY Veronica Russell is here to discuss her progress with her obesity treatment plan. She is on the Category 2 plan + 100 calories and is following her eating plan approximately 70 % of the time. She states she is walking 8,000 steps 5 times per week. Veronica Russell did well with weight loss. She reports that she has homecoming next week and some celebrations scheduled. She is asking for strategies.  Her weight is 263 lb (119.3 kg) today and has had a weight loss of 1 pound over a period of 3 weeks since her last visit. She has lost 0 lbs since starting treatment with [name].  Diabetes II Veronica Russell has a diagnosis of diabetes type II. Veronica Russell states fasting BGs range between 132 and 195, and denies polyphagia or any hypoglycemic episodes. She is on metformin and Victoza and she denies nausea, vomiting, or diarrhea. Last A1c was 7.4. She has been working on intensive lifestyle modifications including diet, exercise, and weight loss to help control her blood glucose levels.  ALLERGIES: Allergies  Allergen Reactions  . Milk-Related Compounds     Lactose intollerance    MEDICATIONS: Current Outpatient Medications on File Prior to Visit  Medication Sig Dispense Refill  . albuterol (VENTOLIN HFA) 108 (90 Base) MCG/ACT inhaler Inhale into the lungs every 6 (six) hours as needed for wheezing or shortness of breath.    . Alcohol Swabs (ALCOHOL WIPES) 70 % PADS 30 Packages by Does not apply route 2 (two) times daily. 100 each 0  . amLODipine (NORVASC) 5 MG tablet Take 5 mg by mouth daily.    Marland Kitchen atorvastatin (LIPITOR) 20 MG tablet Take 20 mg by mouth daily.    . Cholecalciferol 4000 units CAPS Vitamin D3 4,000 unit capsule   1 capsule every day by oral route.    . Clobetasol Propionate 0.05 % lotion Reported on 07/21/2015    . glucose blood (ACCU-CHEK GUIDE) test strip Use twice daily 60 each 0  . hydrochlorothiazide (HYDRODIURIL) 25 MG tablet Take 25  mg by mouth every morning.  1  . Insulin Pen Needle (BD PEN NEEDLE NANO U/F) 32G X 4 MM MISC 1 Package by Does not apply route daily. 30 each 0  . Lancets (ACCU-CHEK SAFE-T PRO) lancets Use as instructed 60 each 0  . liraglutide (VICTOZA) 18 MG/3ML SOPN Inject 0.3 mLs (1.8 mg total) into the skin every morning. 3 pen 0  . losartan (COZAAR) 50 MG tablet losartan 50 mg tablet   1 tablet every day by oral route.    . Melatonin 3 MG TABS Take 1 tablet by mouth at bedtime as needed.    . metFORMIN (GLUCOPHAGE-XR) 500 MG 24 hr tablet Take 1,000 mg by mouth at bedtime.     . montelukast (SINGULAIR) 10 MG tablet Take 10 mg by mouth at bedtime.    . propranolol (INDERAL) 20 MG tablet Take 20 mg by mouth daily.     Marland Kitchen omeprazole (PRILOSEC) 20 MG capsule Take 1 capsule (20 mg total) by mouth daily. 30 capsule 3   No current facility-administered medications on file prior to visit.     PAST MEDICAL HISTORY: Past Medical History:  Diagnosis Date  . Alopecia   . Asthma   . Back pain   . Diabetes mellitus ([place])   . GERD (gastroesophageal reflux disease)   . Hyperlipidemia   . Hypertension   . Joint pain   .  Lactose intolerance   . Obesity   . Raynauds syndrome   . Swelling   . Tremor   . Vitamin D deficiency     PAST SURGICAL HISTORY: Past Surgical History:  Procedure Laterality Date  . ABDOMINAL HYSTERECTOMY    . BREAST BIOPSY    . MYOMECTOMY      SOCIAL HISTORY: Social History   Tobacco Use  . Smoking status: Never Smoker  . Smokeless tobacco: Never Used  Substance Use Topics  . Alcohol use: No  . Drug use: No    FAMILY HISTORY: Family History  Problem Relation Age of Onset  . Multiple myeloma Mother   . Hypertension Mother   . Hyperlipidemia Mother   . Cancer Mother   . Obesity Mother   . Hypertension Father   . Hyperlipidemia Father   . Cancer Father   . Asthma Other   . Hyperlipidemia Other   . Hypertension Other   . Cancer Other   . COPD Other   . Stroke  Other   . Breast cancer Maternal Aunt   . Breast cancer Paternal Aunt   . Breast cancer Maternal Aunt     ROS: Review of Systems  Constitutional: Positive for weight loss.  Gastrointestinal: Negative for diarrhea, nausea and vomiting.  Endo/Heme/Allergies:       Negative polyphagia Negative hypoglycemia    PHYSICAL EXAM: Blood pressure 118/81, pulse (!) 109, temperature 98.3 F (36.8 C), temperature source Oral, height '5\' 4"'$  (1.626 m), weight 263 lb (119.3 kg), last menstrual period 08/31/2008, SpO2 98 %. Body mass index is 45.14 kg/m. Physical Exam  Constitutional: She is oriented to person, place, and time. She appears well-developed and well-nourished.  Cardiovascular: Normal rate.  Pulmonary/Chest: Effort normal.  Musculoskeletal: Normal range of motion.  Neurological: She is oriented to person, place, and time.  Skin: Skin is warm and dry.  Psychiatric: She has a normal mood and affect. Her behavior is normal.  Vitals reviewed.   RECENT LABS AND TESTS: BMET    Component Value Date/Time   NA 145 (H) 10/26/2017 0945   K 3.9 10/26/2017 0945   CL 101 10/26/2017 0945   CO2 25 10/26/2017 0945   GLUCOSE 152 (H) 10/26/2017 0945   GLUCOSE 99 09/10/2009 0540   BUN 16 10/26/2017 0945   CREATININE 0.82 10/26/2017 0945   CALCIUM 9.9 10/26/2017 0945   GFRNONAA 79 10/26/2017 0945   GFRAA 91 10/26/2017 0945   Lab Results  Component Value Date   HGBA1C 7.4 (H) 10/26/2017   HGBA1C 7.1 (A) 10/05/2017   HGBA1C 7.8 05/10/2017   HGBA1C 6.6 (H) 09/29/2016   HGBA1C 6.5 (H) 05/09/2016   Lab Results  Component Value Date   INSULIN 56.5 (H) 10/26/2017   INSULIN 39.0 (H) 09/29/2016   INSULIN 38.5 (H) 05/09/2016   INSULIN 27.3 (H) 01/27/2016   CBC    Component Value Date/Time   WBC 6.8 01/27/2016 1006   WBC 11.1 (H) 09/10/2009 0540   RBC 4.28 01/27/2016 1006   RBC 3.89 09/10/2009 0540   HGB 12.1 01/27/2016 1006   HCT 37.0 01/27/2016 1006   PLT 241 09/10/2009 0540   MCV  86 01/27/2016 1006   MCH 28.3 01/27/2016 1006   MCH 29.0 09/10/2009 0540   MCHC 32.7 01/27/2016 1006   MCHC 33.0 09/10/2009 0540   RDW 13.3 01/27/2016 1006   LYMPHSABS 2.0 01/27/2016 1006   EOSABS 0.2 01/27/2016 1006   BASOSABS 0.0 01/27/2016 1006   Iron/TIBC/Ferritin/ %Sat No  results found for: IRON, TIBC, FERRITIN, IRONPCTSAT Lipid Panel     Component Value Date/Time   CHOL 162 10/26/2017 0945   TRIG 178 (H) 10/26/2017 0945   HDL 55 10/26/2017 0945   LDLCALC 71 10/26/2017 0945   Hepatic Function Panel     Component Value Date/Time   PROT 7.0 10/26/2017 0945   ALBUMIN 4.1 10/26/2017 0945   AST 18 10/26/2017 0945   ALT 35 (H) 10/26/2017 0945   ALKPHOS 101 10/26/2017 0945   BILITOT 0.3 10/26/2017 0945      Component Value Date/Time   TSH 0.50 10/05/2017 0956   TSH 0.52 04/04/2017 0859   TSH 0.436 (L) 09/29/2016 0937    ASSESSMENT AND PLAN: Type 2 diabetes mellitus without complication, without long-term current use of insulin (HCC)  Class 3 severe obesity with serious comorbidity and body mass index (BMI) of 45.0 to 49.9 in adult, unspecified obesity type ([place])  PLAN:  Diabetes II Veronica Russell has been given extensive diabetes education by myself today including ideal fasting and post-prandial blood glucose readings, individual ideal Hgb A1c goals and hypoglycemia prevention. We discussed the importance of good blood sugar control to decrease the likelihood of diabetic complications such as nephropathy, neuropathy, limb loss, blindness, coronary artery disease, and death. We discussed the importance of intensive lifestyle modification including diet, exercise and weight loss as the first line treatment for diabetes. Veronica Russell agrees to continue her diabetes medications, diet, exercise, and weight loss. Veronica Russell agrees to follow up with our clinic in 3 to 4 weeks.  I spent > than 50% of the 15 minute visit on counseling as documented in the note.  Obesity Veronica Russell is currently in the  action stage of change. As such, her goal is to continue with weight loss efforts She has agreed to follow the Category 2 plan + 100 calories Veronica Russell has been instructed to work up to a goal of 150 minutes of combined cardio and strengthening exercise per week for weight loss and overall health benefits. We discussed the following Behavioral Modification Strategies today: work on meal planning and easy cooking plans and celebration eating strategies   Veronica Russell has agreed to follow up with our clinic in 3 to 4 weeks. She was informed of the importance of frequent follow up visits to maximize her success with intensive lifestyle modifications for her multiple health conditions.   OBESITY BEHAVIORAL INTERVENTION VISIT  Today's visit was # 36  Starting weight: 259 lbs Starting date: 01/27/16 Today's weight : 263 lbs  Today's date: 11/16/2017 Total lbs lost to date: 0    ASK: We discussed the diagnosis of obesity with Veronica Russell today and Veronica Russell agreed to give [name] permission to discuss obesity behavioral modification therapy today.  ASSESS: Veronica Russell has the diagnosis of obesity and her BMI today is 45.12 Veronica Russell is in the action stage of change   ADVISE: Veronica Russell was educated on the multiple health risks of obesity as well as the benefit of weight loss to improve her health. She was advised of the need for long term treatment and the importance of lifestyle modifications.  AGREE: Multiple dietary modification options and treatment options were discussed and  Veronica Russell agreed to the above obesity treatment plan.  Wilhemena Durie, am acting as transcriptionist for Abby Potash, PA-C I, Abby Potash, PA-C have reviewed above note and agree with its content

## 2017-11-30 ENCOUNTER — Other Ambulatory Visit (INDEPENDENT_AMBULATORY_CARE_PROVIDER_SITE_OTHER): Payer: Self-pay | Admitting: Physician Assistant

## 2017-11-30 DIAGNOSIS — E119 Type 2 diabetes mellitus without complications: Secondary | ICD-10-CM

## 2017-12-01 MED FILL — ACCU-CHEK FASTCLIX LANCETS: 90 days supply | Qty: 102 | Fill #0

## 2017-12-01 MED FILL — LOSARTAN POTASSIUM 50 MG TA: 50 | 90 days supply | Qty: 90 | Fill #0

## 2017-12-01 MED FILL — PROPRANOLOL 20 MG TABLET: 20 | 90 days supply | Qty: 90 | Fill #0

## 2017-12-01 MED FILL — AMLODIPINE BESYLATE 5 MG TA: 5 | 90 days supply | Qty: 90 | Fill #0

## 2017-12-01 MED FILL — ACCU-CHEK GUIDE STRP: 90 days supply | Qty: 100 | Fill #0

## 2017-12-01 MED FILL — MONTELUKAST SOD 10 MG TAB: 10 | 90 days supply | Qty: 90 | Fill #0

## 2017-12-04 MED FILL — VICTOZA 18 MG/3 ML INJECT P: 18 | 30 days supply | Qty: 9 | Fill #0

## 2017-12-07 ENCOUNTER — Ambulatory Visit (INDEPENDENT_AMBULATORY_CARE_PROVIDER_SITE_OTHER): Payer: 59 | Admitting: Physician Assistant

## 2017-12-07 VITALS — BP 127/87 | HR 91 | Temp 98.3°F | Ht 64.0 in | Wt 263.0 lb

## 2017-12-07 DIAGNOSIS — Z6841 Body Mass Index (BMI) 40.0 and over, adult: Secondary | ICD-10-CM | POA: Diagnosis not present

## 2017-12-07 DIAGNOSIS — E119 Type 2 diabetes mellitus without complications: Secondary | ICD-10-CM | POA: Diagnosis not present

## 2017-12-07 NOTE — Progress Notes (Signed)
Office: [phone]  /  Fax: [phone]   HPI:   Chief Complaint: OBESITY Natash is here to discuss her progress with her obesity treatment plan. She is on the Category 2 plan and is following her eating plan approximately 70 % of the time. She states she is walking 8,000 to 9,000 steps daily. Contessa did well with weight maintenance. She reports tailgating at football games. She is not getting in enough protein.   Her weight is 263 lb (119.3 kg) today and has not lost weight since her last visit. She has lost 0 lbs since starting treatment with [name].  Diabetes II Huong has a diagnosis of diabetes type II. Najee states her fasting BGs range between 136 and 161. She is on metformin and Victoza. Her last A1c was 7.4 on 10/26/17. She has been working on intensive lifestyle modifications including diet, exercise, and weight loss to help control her blood glucose levels.  ALLERGIES: Allergies  Allergen Reactions  . Milk-Related Compounds     Lactose intollerance    MEDICATIONS: Current Outpatient Medications on File Prior to Visit  Medication Sig Dispense Refill  . albuterol (VENTOLIN HFA) 108 (90 Base) MCG/ACT inhaler Inhale into the lungs every 6 (six) hours as needed for wheezing or shortness of breath.    . Alcohol Swabs (ALCOHOL WIPES) 70 % PADS 30 Packages by Does not apply route 2 (two) times daily. 100 each 0  . amLODipine (NORVASC) 5 MG tablet Take 5 mg by mouth daily.    Marland Kitchen atorvastatin (LIPITOR) 20 MG tablet Take 20 mg by mouth daily.    . Cholecalciferol 4000 units CAPS Vitamin D3 4,000 unit capsule   1 capsule every day by oral route.    . Clobetasol Propionate 0.05 % lotion Reported on 07/21/2015    . glucose blood (ACCU-CHEK GUIDE) test strip Use twice daily 60 each 0  . hydrochlorothiazide (HYDRODIURIL) 25 MG tablet Take 25 mg by mouth every morning.  1  . Insulin Pen Needle (BD PEN NEEDLE NANO U/F) 32G X 4 MM MISC 1 Package by Does not apply route daily. 30 each 0  .  Lancets (ACCU-CHEK SAFE-T PRO) lancets Use as instructed 60 each 0  . losartan (COZAAR) 50 MG tablet losartan 50 mg tablet   1 tablet every day by oral route.    . Melatonin 3 MG TABS Take 1 tablet by mouth at bedtime as needed.    . metFORMIN (GLUCOPHAGE-XR) 500 MG 24 hr tablet Take 1,000 mg by mouth at bedtime.     . montelukast (SINGULAIR) 10 MG tablet Take 10 mg by mouth at bedtime.    Marland Kitchen omeprazole (PRILOSEC) 20 MG capsule Take 1 capsule (20 mg total) by mouth daily. 30 capsule 3  . propranolol (INDERAL) 20 MG tablet Take 20 mg by mouth daily.     Marland Kitchen VICTOZA 18 MG/3ML SOPN INJECT 1.8 MGS INTO THE SKIN EVERY MORNING. 9 mL 0   No current facility-administered medications on file prior to visit.     PAST MEDICAL HISTORY: Past Medical History:  Diagnosis Date  . Alopecia   . Asthma   . Back pain   . Diabetes mellitus ([place])   . GERD (gastroesophageal reflux disease)   . Hyperlipidemia   . Hypertension   . Joint pain   . Lactose intolerance   . Obesity   . Raynauds syndrome   . Swelling   . Tremor   . Vitamin D deficiency     PAST SURGICAL  HISTORY: Past Surgical History:  Procedure Laterality Date  . ABDOMINAL HYSTERECTOMY    . BREAST BIOPSY    . MYOMECTOMY      SOCIAL HISTORY: Social History   Tobacco Use  . Smoking status: Never Smoker  . Smokeless tobacco: Never Used  Substance Use Topics  . Alcohol use: No  . Drug use: No    FAMILY HISTORY: Family History  Problem Relation Age of Onset  . Multiple myeloma Mother   . Hypertension Mother   . Hyperlipidemia Mother   . Cancer Mother   . Obesity Mother   . Hypertension Father   . Hyperlipidemia Father   . Cancer Father   . Asthma Other   . Hyperlipidemia Other   . Hypertension Other   . Cancer Other   . COPD Other   . Stroke Other   . Breast cancer Maternal Aunt   . Breast cancer Paternal Aunt   . Breast cancer Maternal Aunt     ROS: Review of Systems  Constitutional: Negative for weight loss.     PHYSICAL EXAM: Blood pressure 127/87, pulse 91, temperature 98.3 F (36.8 C), height 5' 4"  (1.626 m), weight 263 lb (119.3 kg), last menstrual period 08/31/2008, SpO2 99 %. Body mass index is 45.14 kg/m. Physical Exam  Constitutional: She is oriented to person, place, and time. She appears well-developed and well-nourished.  Cardiovascular: Normal rate.  Pulmonary/Chest: Effort normal.  Musculoskeletal: Normal range of motion.  Neurological: She is oriented to person, place, and time.  Skin: Skin is warm and dry.  Psychiatric: She has a normal mood and affect. Her behavior is normal.  Vitals reviewed.   RECENT LABS AND TESTS: BMET    Component Value Date/Time   NA 145 (H) 10/26/2017 0945   K 3.9 10/26/2017 0945   CL 101 10/26/2017 0945   CO2 25 10/26/2017 0945   GLUCOSE 152 (H) 10/26/2017 0945   GLUCOSE 99 09/10/2009 0540   BUN 16 10/26/2017 0945   CREATININE 0.82 10/26/2017 0945   CALCIUM 9.9 10/26/2017 0945   GFRNONAA 79 10/26/2017 0945   GFRAA 91 10/26/2017 0945   Lab Results  Component Value Date   HGBA1C 7.4 (H) 10/26/2017   HGBA1C 7.1 (A) 10/05/2017   HGBA1C 7.8 05/10/2017   HGBA1C 6.6 (H) 09/29/2016   HGBA1C 6.5 (H) 05/09/2016   Lab Results  Component Value Date   INSULIN 56.5 (H) 10/26/2017   INSULIN 39.0 (H) 09/29/2016   INSULIN 38.5 (H) 05/09/2016   INSULIN 27.3 (H) 01/27/2016   CBC    Component Value Date/Time   WBC 6.8 01/27/2016 1006   WBC 11.1 (H) 09/10/2009 0540   RBC 4.28 01/27/2016 1006   RBC 3.89 09/10/2009 0540   HGB 12.1 01/27/2016 1006   HCT 37.0 01/27/2016 1006   PLT 241 09/10/2009 0540   MCV 86 01/27/2016 1006   MCH 28.3 01/27/2016 1006   MCH 29.0 09/10/2009 0540   MCHC 32.7 01/27/2016 1006   MCHC 33.0 09/10/2009 0540   RDW 13.3 01/27/2016 1006   LYMPHSABS 2.0 01/27/2016 1006   EOSABS 0.2 01/27/2016 1006   BASOSABS 0.0 01/27/2016 1006   Iron/TIBC/Ferritin/ %Sat No results found for: IRON, TIBC, FERRITIN,  IRONPCTSAT Lipid Panel     Component Value Date/Time   CHOL 162 10/26/2017 0945   TRIG 178 (H) 10/26/2017 0945   HDL 55 10/26/2017 0945   LDLCALC 71 10/26/2017 0945   Hepatic Function Panel     Component Value Date/Time  PROT 7.0 10/26/2017 0945   ALBUMIN 4.1 10/26/2017 0945   AST 18 10/26/2017 0945   ALT 35 (H) 10/26/2017 0945   ALKPHOS 101 10/26/2017 0945   BILITOT 0.3 10/26/2017 0945      Component Value Date/Time   TSH 0.50 10/05/2017 0956   TSH 0.52 04/04/2017 0859   TSH 0.436 (L) 09/29/2016 0937   Results for STEPHANA, MORELL (MRN [id number]) as of 12/07/2017 15:30  Ref. Range 10/26/2017 09:45  Vitamin D, 25-Hydroxy Latest Ref Range: 30.0 - 100.0 ng/mL 50.8   ASSESSMENT AND PLAN: Type 2 diabetes mellitus without complication, without long-term current use of insulin (HCC)  Class 3 severe obesity with serious comorbidity and body mass index (BMI) of 45.0 to 49.9 in adult, unspecified obesity type ([place])  PLAN:  Diabetes II Del has been given extensive diabetes education by myself today including ideal fasting and post-prandial blood glucose readings, individual ideal Hgb A1c goals, and hypoglycemia prevention. We discussed the importance of good blood sugar control to decrease the likelihood of diabetic complications such as nephropathy, neuropathy, limb loss, blindness, coronary artery disease, and death. We discussed the importance of intensive lifestyle modification including diet, exercise and weight loss as the first line treatment for diabetes. Saryah agrees to continue her metformin, Victoza, diet and weight loss. She will follow up at the agreed upon time.  I spent > than 50% of the 15 minute visit on counseling as documented in the note.  Obesity Kelsa is currently in the action stage of change. As such, her goal is to continue with weight loss efforts. She has agreed to follow the Category 2 plan. Zuha has been instructed to work up to a goal of 150 minutes  of combined cardio and strengthening exercise per week for weight loss and overall health benefits. We discussed the following Behavioral Modification Strategies today: increasing lean protein intake, work on meal planning and easy cooking plans, and keeping healthy foods in the home.  Avrielle has agreed to follow up with our clinic in 3 to 4  weeks. She was informed of the importance of frequent follow up visits to maximize her success with intensive lifestyle modifications for her multiple health conditions.   OBESITY BEHAVIORAL INTERVENTION VISIT  Today's visit was # 37   Starting weight: 259 lbs Starting date: 01/27/16 Today's weight : Weight: 263 lb (119.3 kg)  Today's date: 12/07/2017 Total lbs lost to date: 0  ASK: We discussed the diagnosis of obesity with [name] today and [name] agreed to give [name] permission to discuss obesity behavioral modification therapy today.  ASSESS: Litzy has the diagnosis of obesity and her BMI today is 45.12 Lesly is in the action stage of change.   ADVISE: Parilee was educated on the multiple health risks of obesity as well as the benefit of weight loss to improve her health. She was advised of the need for long term treatment and the importance of lifestyle modifications to improve her current health and to decrease her risk of future health problems.  AGREE: Multiple dietary modification options and treatment options were discussed and Normajean agreed to follow the recommendations documented in the above note.  ARRANGE: Bradlee was educated on the importance of frequent visits to treat obesity as outlined per CMS and USPSTF guidelines and agreed to schedule her next follow up appointment today.  Lenward Chancellor, am acting as transcriptionist for Abby Potash, PA-C I, Abby Potash, PA-C have reviewed above note and agree with its content

## 2017-12-14 MED FILL — HYDROCHLOROTHIAZIDE 25 MG T: 25 | 90 days supply | Qty: 90 | Fill #1

## 2018-01-03 ENCOUNTER — Ambulatory Visit (INDEPENDENT_AMBULATORY_CARE_PROVIDER_SITE_OTHER): Payer: 59 | Admitting: Physician Assistant

## 2018-01-03 ENCOUNTER — Encounter (INDEPENDENT_AMBULATORY_CARE_PROVIDER_SITE_OTHER): Payer: Self-pay

## 2018-01-10 ENCOUNTER — Ambulatory Visit: Payer: 59 | Admitting: Internal Medicine

## 2018-01-10 ENCOUNTER — Encounter: Payer: Self-pay | Admitting: Internal Medicine

## 2018-01-10 VITALS — BP 140/80 | HR 105 | Ht 64.0 in | Wt 265.0 lb

## 2018-01-10 DIAGNOSIS — E119 Type 2 diabetes mellitus without complications: Secondary | ICD-10-CM | POA: Diagnosis not present

## 2018-01-10 DIAGNOSIS — E0590 Thyrotoxicosis, unspecified without thyrotoxic crisis or storm: Secondary | ICD-10-CM

## 2018-01-10 DIAGNOSIS — E042 Nontoxic multinodular goiter: Secondary | ICD-10-CM

## 2018-01-10 LAB — POCT GLYCOSYLATED HEMOGLOBIN (HGB A1C): HEMOGLOBIN A1C: 7 % — AB (ref 4.0–5.6)

## 2018-01-10 MED ORDER — GLUCOSE BLOOD VI STRP: ORAL_STRIP | 99 refills | Status: DC

## 2018-01-10 MED ORDER — GLIPIZIDE 5 MG PO TABS: 5.0000 mg | ORAL_TABLET | Freq: Every day | ORAL | 3 refills | Status: DC

## 2018-01-10 MED ORDER — METFORMIN HCL ER 500 MG PO TB24: 1000.0000 mg | ORAL_TABLET | Freq: Every day | ORAL | 3 refills | Status: DC

## 2018-01-10 MED ORDER — LIRAGLUTIDE 18 MG/3ML ~~LOC~~ SOPN: PEN_INJECTOR | SUBCUTANEOUS | 3 refills | Status: DC

## 2018-01-10 MED ORDER — INSULIN PEN NEEDLE 32G X 4 MM MISC: 1.0000 | Freq: Every day | 99 refills | Status: DC

## 2018-01-10 MED FILL — glipiZIDE 5 MG TABS: 5 | 90 days supply | Qty: 90 | Fill #0

## 2018-01-10 MED FILL — VICTOZA 18 MG/3 ML INJECT P: 18 | 30 days supply | Qty: 9 | Fill #0

## 2018-01-10 MED FILL — UNIFINE PENTIPS 32GX5/32: 32G X 4 MM | 90 days supply | Qty: 100 | Fill #0

## 2018-01-10 MED FILL — UNIFINE PENTIPS 32GX5/32": 32G X 4 MM | 90 days supply | Qty: 100 | Fill #0

## 2018-01-10 NOTE — Progress Notes (Signed)
Patient ID: Veronica Russell, female   DOB: 12-29-1960, 57 y.o.   MRN: [id number]    HPI  Veronica Russell is a 57 y.o.-year-old female, returning for follow-up for h/o a low TSH, left thyroid nodule, and DM2 non-insulin-dependent.  Last visit 3 months ago.  Low TSH:  Reviewed hx;Patient was found to have a slightly low TSH at her first visit in the weight loss clinic in 01/2016.  The TFTs were very stable since then, with a TSH just under the lower limit of normal, and free T4 normal.  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 - M aunt. No FH of thyroid cancer. No h/o radiation tx to head or neck.  No seaweed or kelp. No recent contrast studies. No herbal supplements. No Biotin use. No recent steroids use.   Left thyroid nodule: Reviewed previous images and biopsy results:  04/14/2017: 2.9 x 2 x 2.7 solid, hypoechoic, left thyroid nodule 05/11/2017: FNA of the nodule: Benign  Pt denies: - feeling nodules in neck - hoarseness - dysphagia - choking - SOB with lying down  DM2:  Reviewed latest HbA1c levels: Lab Results  Component Value Date   HGBA1C 7.4 (H) 10/26/2017   HGBA1C 7.1 (A) 10/05/2017   HGBA1C 7.8 05/10/2017   HGBA1C 6.6 (H) 09/29/2016   HGBA1C 6.5 (H) 05/09/2016   HGBA1C 6.7 (H) 01/27/2016   HGBA1C 6.9 01/12/2016   HGBA1C 6.6 07/21/2015   HGBA1C 6.8 01/07/2015    She is on: - Metformin ER 1000 mg at bedtime >> dinnertime  - Victoza 1.2 mg + 5 clicks in am (could not tolerate 1.8 mg because of diarrhea)  She checks her sugars twice a day per review of her log: - am: 126-159 >> 138-166, 171 - 2h after b'fast: n/c - lunch: n/c >> 139 - 2h after lunch: n/c - dinner: n/c >> 125-177 - 2h after dinner: 103-280 >> 114-193, 224, 238 - bedtime: n/c Lowest: 103 >> 114; hypoglycemia awareness in the 70s. Highest: 280 (tea) >> 238.  Meter: Accu-Chek  She continues to drink half-and-half tea, occasional regular Coca-Cola.  No CKD: Lab Results  Component Value Date   BUN 16 10/26/2017   Lab Results  Component Value Date   CREATININE 0.82 10/26/2017  On losartan.  + HL: Lab Results  Component Value Date   CHOL 162 10/26/2017   HDL 55 10/26/2017   LDLCALC 71 10/26/2017   TRIG 178 (H) 10/26/2017  On Lipitor 20.  Last eye exam: 08/2017: No DR, + glaucoma  She also has HTN, asthma, vitamin D deficiency.  She has a history of hair loss and is seeing Dr. Trevor Iha, dermatologist at [facility].  She specializes in hair loss.  ROS: Constitutional: no weight gain/no weight loss, no fatigue, no  subjective hyperthermia, no subjective hypothermia Eyes: no blurry vision, no xerophthalmia ENT: no sore throat, no nodules palpated in neck, no dysphagia, no odynophagia, no hoarseness Cardiovascular: no CP/no SOB/no palpitations/+ R leg swelling Respiratory: no cough/no SOB/no wheezing Gastrointestinal: no N/no V/no D/no C/no acid reflux Musculoskeletal: no muscle aches/no joint aches Skin: no rashes, no hair loss Neurological: no tremors/no numbness/no tingling/no dizziness  I reviewed pt's medications, allergies, PMH, social hx, family hx, and changes were documented in the history of present illness. Otherwise, unchanged from my initial visit note.  Past Medical History:  Diagnosis Date  . Alopecia   . Asthma   . Back pain   .  Diabetes mellitus ([employer])   . GERD (gastroesophageal reflux disease)   . Hyperlipidemia   . Hypertension   . Joint pain   . Lactose intolerance   . Obesity   . Raynauds syndrome   . Swelling   . Tremor   . Vitamin D deficiency    Past Surgical History:  Procedure Laterality Date  . ABDOMINAL HYSTERECTOMY    . BREAST BIOPSY    . MYOMECTOMY     Social History   Socioeconomic History  . Marital status: Single    Spouse name: Not on file  . Number of children: no  Social Needs  Occupational History  . Occupation: [employer]    Employer: [employer]  Tobacco Use  . Smoking status: Never Smoker  . Smokeless tobacco: Never Used  Substance and Sexual Activity  . Alcohol use:  Wine, 2-3 times a year, 1 drink  . Drug use: No   Current Outpatient Medications on File Prior to Visit  Medication Sig Dispense Refill  . albuterol (VENTOLIN HFA) 108 (90 Base) MCG/ACT inhaler Inhale into the lungs every 6 (six) hours as needed for wheezing or shortness of breath.    . Alcohol Swabs (ALCOHOL WIPES) 70 % PADS 30 Packages by Does not apply route 2 (two) times daily. 100 each 0  . amLODipine (NORVASC) 5 MG tablet Take 5 mg by mouth daily.    Marland Kitchen atorvastatin (LIPITOR) 20 MG tablet Take 20 mg by mouth daily.    . Cholecalciferol 4000 units CAPS Vitamin D3 4,000 unit capsule   1 capsule every day by oral route.    . Clobetasol Propionate 0.05 % lotion Reported on 07/21/2015    . glucose blood (ACCU-CHEK GUIDE) test strip Use twice daily 60 each 0  . hydrochlorothiazide (HYDRODIURIL) 25 MG tablet Take 25 mg by mouth every morning.  1  . Insulin Pen Needle (BD PEN NEEDLE NANO U/F) 32G X 4 MM MISC 1 Package by Does not apply route daily. 30 each 0  . Lancets (ACCU-CHEK SAFE-T PRO) lancets Use as instructed 60 each 0  . losartan (COZAAR) 50 MG tablet losartan 50 mg tablet   1 tablet every day by oral route.    . Melatonin 3 MG TABS Take 1 tablet by mouth at bedtime as needed.    . metFORMIN  (GLUCOPHAGE-XR) 500 MG 24 hr tablet Take 1,000 mg by mouth at bedtime.     . montelukast (SINGULAIR) 10 MG tablet Take 10 mg by mouth at bedtime.    Marland Kitchen omeprazole (PRILOSEC) 20 MG capsule Take 1 capsule (20 mg total) by mouth daily. 30 capsule 3  . propranolol (INDERAL) 20 MG tablet Take 20 mg by mouth daily.     Marland Kitchen VICTOZA 18 MG/3ML SOPN INJECT 1.8 MGS INTO THE SKIN EVERY MORNING. 9  mL 0   No current facility-administered medications on file prior to visit.    Allergies  Allergen Reactions  . Milk-Related Compounds     Lactose intollerance   Family History  Problem Relation Age of Onset  . Multiple myeloma Mother   . Hypertension Mother   . Hyperlipidemia Mother   . Cancer Mother   . Obesity Mother   . Hypertension Father   . Hyperlipidemia Father   . Cancer Father   . Asthma Other   . Hyperlipidemia Other   . Hypertension Other   . Cancer Other   . COPD Other   . Stroke Other   . Breast cancer Maternal Aunt   . Breast cancer Paternal Aunt   . Breast cancer Maternal Aunt     PE: BP 140/80   Pulse (!) 105   Ht _0  (1.626 m) Comment: measured  Wt 265 lb (120.2 kg)   LMP 08/31/2008   SpO2 97%   BMI 45.49 kg/m  Wt Readings from Last 3 Encounters:  01/10/18 265 lb (120.2 kg)  12/07/17 263 lb (119.3 kg)  11/16/17 263 lb (119.3 kg)   Constitutional: overweight, in NAD Eyes: PERRLA, EOMI, no exophthalmos ENT: moist mucous membranes, no thyromegaly, + ~1 cm L thyroid nodule - mobile, no cervical lymphadenopathy Cardiovascular: tachycardia, RR, No MRG Respiratory: CTA B Gastrointestinal: abdomen soft, NT, ND, BS+ Musculoskeletal: no deformities, strength intact in all 4 Skin: moist, warm, no rashes Neurological: no tremor with outstretched hands, DTR normal in all 4  ASSESSMENT: 1.  Mild subclinical thyrotoxicosis  2.  left thyroid nodule Thyroid ultrasound: FINDINGS: Parenchymal Echotexture: Mildly heterogenous Isthmus: 0.5 cm Right lobe: 4.5 x 2.1 x 1.3  cm Left lobe: 4.3 x 1.4 x 1.6 cm _________________________________________________________  Nodule # 1: Location: Right; Inferior Maximum size: 2.9 cm; Other 2 dimensions: 2.0 x 2.1 cm Composition: solid/almost completely solid (2) Echogenicity: hypoechoic (2) Shape: not taller-than-wide (0) Margins: smooth (0) Echogenic foci: none (0) ACR TI-RADS total points: 4. ACR TI-RADS risk category: TR4 (4-6 points).  ACR TI-RADS recommendations: **Given size (>/= 1.5 cm) and appearance, fine needle aspiration of this moderately suspicious nodule should be considered based on TI-RADS criteria.________________________________________________  Multiple other smaller nodules measure 0.7 cm or less in size and do not meet criteria for biopsy nor follow-up.  IMPRESSION: Right lower pole nodule 1 meets criteria for fine needle aspiration biopsy.  Electronically Signed By: Marybelle Killings M.D. On: 04/14/2017 08:17   FNA: Adequacy Reason Satisfactory For Evaluation. Diagnosis THYROID, FINE NEEDLE ASPIRATION, RLP (SPECIMEN 1 OF 1,COLLECTED 05/11/17): CONSISTENT WITH BENIGN FOLLICULAR NODULE ([place] CATEGORY II). Enid Cutter MD Pathologist, Electronic Signature (Case signed 05/15/2017) Specimen Clinical Information Right Inferior, 2.9cm; Other 2 dimensions: 2.0 x 2.1cm, solid/almost completely solid, hypoechoic, TI-RADS total points - 4, Moderately suspicious nodule Source Thyroid, Fine Needle Aspiration, Right, RLP (Specimen 1 of 1, collected on 05/11/17)  PLAN:  1. Patient with h/o slightly low TSH with normal free thyroid hormones, with some thyrotoxic sxs: hot flushes (but pt is postmenopausal), palpitations, hair loss. No wt loss, anxiety, hyper defecation. TFTs normalized  - last set of TFTs were nornmal 10/2017 -no new hyperthyroid sxs at this visit >> will not recheck her TFTs -We will continue to keep an eye on her TFTs and if they start to decrease, she may need a thyroid  uptake and scan  2. L thyroid nodule -Initially felt on palpation -No neck compression symptoms -I reviewed with her the most recent  thyroid ultrasound from 03/2017 that showed a 2.9 cm thyroid nodule.  This was hypoechoic and solid and we proceeded to biopsied in 05/2017.  The results were benign. -No intervention is needed for now, but I would like to repeat the ultrasound in 2 years from the previous.  3. DM2 -She continues on metformin and Victoza.  At last visit, she was also on Januvia, which we stopped -Also at last visit, we discussed about importance of controlling her diet and I made specific suggestions.  I highly recommended that she stopped drinking carbohydrate rich beverages.  At that time, HbA1c was improved, at 7.1%, but she had a recent HbA1c that was slightly higher, at 7.4% on 10/26/2017. -At this visit, sugars are approximately in the 1 40-1 50 range, with the exception of occasional spikes due to dietary indiscretions.  She usually has late dinners and the sugars after dinner are higher than target.  We discussed about moving dinner earlier, but I also suggested to start glipizide low dose before dinner to help with post dinner hyperglycemia and, subsequently, with the morning elevated CBGs.  We plan to stop this after the holidays. - today, HbA1c is 7.0% (improved, at goal now) - continue checking sugars at different times of the day - check 1x a day, rotating checks - advised for yearly eye exams >> she is UTD - Return to clinic in 3-4 mo with sugar log    Philemon Kingdom, MD PhD [facility] Endocrinology

## 2018-01-10 NOTE — Patient Instructions (Signed)
Please continue: - Metformin 1000 mg at dinnertime - Victoza 1.2 mg + 5 clicks   Please add: - Glipizide 5 mg 15-30 min before dinner.  Please return in 3-4 months with your sugar log.

## 2018-01-16 ENCOUNTER — Encounter (INDEPENDENT_AMBULATORY_CARE_PROVIDER_SITE_OTHER): Payer: Self-pay | Admitting: Physician Assistant

## 2018-01-16 ENCOUNTER — Ambulatory Visit (INDEPENDENT_AMBULATORY_CARE_PROVIDER_SITE_OTHER): Payer: 59 | Admitting: Physician Assistant

## 2018-01-16 VITALS — BP 115/78 | HR 109 | Temp 98.1°F | Ht 64.0 in | Wt 261.0 lb

## 2018-01-16 DIAGNOSIS — Z6841 Body Mass Index (BMI) 40.0 and over, adult: Secondary | ICD-10-CM | POA: Diagnosis not present

## 2018-01-16 DIAGNOSIS — E119 Type 2 diabetes mellitus without complications: Secondary | ICD-10-CM | POA: Diagnosis not present

## 2018-01-17 NOTE — Progress Notes (Signed)
Office: [phone]  /  Fax: [phone]   HPI:   Chief Complaint: OBESITY Betsie is here to discuss her progress with her obesity treatment plan. She is on the Category 2 plan and is following her eating plan approximately 70 % of the time. She states she is walking 6,000 to 7,000 steps 5 days a week. Tangy did well with weight loss. She reports that she has been tracking her protein and calories. She is not getting enough protein most days. Her weight is 261 lb (118.4 kg) today and has had a weight loss of 2 pounds over a period of 5 to 6 weeks since her last visit. She has lost 0 lbs since starting treatment with [name].  Diabetes II Naydene has a diagnosis of diabetes type II. Bryer states fasting BGs range between 138 and 167. She is on glipizide, metformin, and Victoza and she denies polyphagia or hypoglycemia. Last A1c was 7.0. She has been working on intensive lifestyle modifications including diet, exercise, and weight loss to help control her blood glucose levels.   ASSESSMENT AND PLAN:  Type 2 diabetes mellitus without complication, without long-term current use of insulin (HCC)  Class 3 severe obesity with serious comorbidity and body mass index (BMI) of 40.0 to 44.9 in adult, unspecified obesity type ([place])  PLAN:  Diabetes II Vasilisa has been given extensive diabetes education by myself today including ideal fasting and post-prandial blood glucose readings, individual ideal Hgb A1c goals  and hypoglycemia prevention. We discussed the importance of good blood sugar control to decrease the likelihood of diabetic complications such as nephropathy, neuropathy, limb loss, blindness, coronary artery disease, and death. We discussed the importance of intensive lifestyle modification including diet, exercise and weight loss as the first line treatment for diabetes. Dwan agrees to continue her diabetes medications, diet, and weight loss, and she agrees to follow up with our clinic in 3 to 4  weeks.  I spent > than 50% of the 15 minute visit on counseling as documented in the note.  Obesity Alaa is currently in the action stage of change. As such, her goal is to continue with weight loss efforts She has agreed to follow the Category 2 plan Lakeva has been instructed to work up to a goal of 150 minutes of combined cardio and strengthening exercise per week for weight loss and overall health benefits. We discussed the following Behavioral Modification Strategies today: work on meal planning and easy cooking plans and holiday eating strategies  Camani has agreed to follow up with our clinic in 3 to 4 weeks. She was informed of the importance of frequent follow up visits to maximize her success with intensive lifestyle modifications for her multiple health conditions.  ALLERGIES: Allergies  Allergen Reactions  . Milk-Related Compounds     Lactose intollerance    MEDICATIONS: Current Outpatient Medications on File Prior to Visit  Medication Sig Dispense Refill  . albuterol (VENTOLIN HFA) 108 (90 Base) MCG/ACT inhaler Inhale into the lungs every 6 (six) hours as needed for wheezing or shortness of breath.    . Alcohol Swabs (ALCOHOL WIPES) 70 % PADS 30 Packages by Does not apply route 2 (two) times daily. 100 each 0  . amLODipine (NORVASC) 5 MG tablet Take 5 mg by mouth daily.    Marland Kitchen atorvastatin (LIPITOR) 20 MG tablet Take 20 mg by mouth daily.    . Cholecalciferol 4000 units CAPS Vitamin D3 4,000 unit capsule   1 capsule every day by oral  route.    . glipiZIDE (GLUCOTROL) 5 MG tablet Take 1 tablet (5 mg total) by mouth daily before supper. 90 tablet 3  . glucose blood (ACCU-CHEK GUIDE) test strip Use twice daily 100 each prn  . hydrochlorothiazide (HYDRODIURIL) 25 MG tablet Take 25 mg by mouth every morning.  1  . Insulin Pen Needle (BD PEN NEEDLE NANO U/F) 32G X 4 MM MISC 1 Package by Does not apply route daily. 100 each prn  . Lancets (ACCU-CHEK SAFE-T PRO) lancets Use as  instructed 60 each 0  . liraglutide (VICTOZA) 18 MG/3ML SOPN INJECT 1.8 MGS INTO THE SKIN EVERY MORNING. 9 mL 3  . losartan (COZAAR) 50 MG tablet losartan 50 mg tablet   1 tablet every day by oral route.    . Melatonin 3 MG TABS Take 1 tablet by mouth at bedtime as needed.    . metFORMIN (GLUCOPHAGE-XR) 500 MG 24 hr tablet Take 2 tablets (1,000 mg total) by mouth at bedtime. 180 tablet 3  . montelukast (SINGULAIR) 10 MG tablet Take 10 mg by mouth at bedtime.    . propranolol (INDERAL) 20 MG tablet Take 20 mg by mouth daily.     Marland Kitchen omeprazole (PRILOSEC) 20 MG capsule Take 1 capsule (20 mg total) by mouth daily. 30 capsule 3   No current facility-administered medications on file prior to visit.     PAST MEDICAL HISTORY: Past Medical History:  Diagnosis Date  . Alopecia   . Asthma   . Back pain   . Diabetes mellitus ([place])   . GERD (gastroesophageal reflux disease)   . Hyperlipidemia   . Hypertension   . Joint pain   . Lactose intolerance   . Obesity   . Raynauds syndrome   . Swelling   . Tremor   . Vitamin D deficiency     PAST SURGICAL HISTORY: Past Surgical History:  Procedure Laterality Date  . ABDOMINAL HYSTERECTOMY    . BREAST BIOPSY    . MYOMECTOMY      SOCIAL HISTORY: Social History   Tobacco Use  . Smoking status: Never Smoker  . Smokeless tobacco: Never Used  Substance Use Topics  . Alcohol use: No  . Drug use: No    FAMILY HISTORY: Family History  Problem Relation Age of Onset  . Multiple myeloma Mother   . Hypertension Mother   . Hyperlipidemia Mother   . Cancer Mother   . Obesity Mother   . Hypertension Father   . Hyperlipidemia Father   . Cancer Father   . Asthma Other   . Hyperlipidemia Other   . Hypertension Other   . Cancer Other   . COPD Other   . Stroke Other   . Breast cancer Maternal Aunt   . Breast cancer Paternal Aunt   . Breast cancer Maternal Aunt     ROS: Review of Systems  Constitutional: Positive for weight loss.    Musculoskeletal:       Negative for muscle weakness  Endo/Heme/Allergies:       Negative for polyphagia Negative for hypoglycemia     PHYSICAL EXAM: Blood pressure 115/78, pulse (!) 109, temperature 98.1 F (36.7 C), temperature source Oral, height _0  (1.626 m), weight 261 lb (118.4 kg), last menstrual period 08/31/2008, SpO2 94 %. Body mass index is 44.8 kg/m. Physical Exam Vitals signs reviewed.  Constitutional:      Appearance: Normal appearance. She is obese.  Cardiovascular:     Rate and Rhythm: Normal rate.  Pulses: Normal pulses.  Pulmonary:     Effort: Pulmonary effort is normal.  Musculoskeletal: Normal range of motion.  Skin:    General: Skin is warm and dry.  Neurological:     Mental Status: She is alert and oriented to person, place, and time.  Psychiatric:        Mood and Affect: Mood normal.        Behavior: Behavior normal.     RECENT LABS AND TESTS: BMET    Component Value Date/Time   NA 145 (H) 10/26/2017 0945   K 3.9 10/26/2017 0945   CL 101 10/26/2017 0945   CO2 25 10/26/2017 0945   GLUCOSE 152 (H) 10/26/2017 0945   GLUCOSE 99 09/10/2009 0540   BUN 16 10/26/2017 0945   CREATININE 0.82 10/26/2017 0945   CALCIUM 9.9 10/26/2017 0945   GFRNONAA 79 10/26/2017 0945   GFRAA 91 10/26/2017 0945   Lab Results  Component Value Date   HGBA1C 7.0 (A) 01/10/2018   HGBA1C 7.4 (H) 10/26/2017   HGBA1C 7.1 (A) 10/05/2017   HGBA1C 7.8 05/10/2017   HGBA1C 6.6 (H) 09/29/2016   Lab Results  Component Value Date   INSULIN 56.5 (H) 10/26/2017   INSULIN 39.0 (H) 09/29/2016   INSULIN 38.5 (H) 05/09/2016   INSULIN 27.3 (H) 01/27/2016   CBC    Component Value Date/Time   WBC 6.8 01/27/2016 1006   WBC 11.1 (H) 09/10/2009 0540   RBC 4.28 01/27/2016 1006   RBC 3.89 09/10/2009 0540   HGB 12.1 01/27/2016 1006   HCT 37.0 01/27/2016 1006   PLT 241 09/10/2009 0540   MCV 86 01/27/2016 1006   MCH 28.3 01/27/2016 1006   MCH 29.0 09/10/2009 0540   MCHC  32.7 01/27/2016 1006   MCHC 33.0 09/10/2009 0540   RDW 13.3 01/27/2016 1006   LYMPHSABS 2.0 01/27/2016 1006   EOSABS 0.2 01/27/2016 1006   BASOSABS 0.0 01/27/2016 1006   Iron/TIBC/Ferritin/ %Sat No results found for: IRON, TIBC, FERRITIN, IRONPCTSAT Lipid Panel     Component Value Date/Time   CHOL 162 10/26/2017 0945   TRIG 178 (H) 10/26/2017 0945   HDL 55 10/26/2017 0945   LDLCALC 71 10/26/2017 0945   Hepatic Function Panel     Component Value Date/Time   PROT 7.0 10/26/2017 0945   ALBUMIN 4.1 10/26/2017 0945   AST 18 10/26/2017 0945   ALT 35 (H) 10/26/2017 0945   ALKPHOS 101 10/26/2017 0945   BILITOT 0.3 10/26/2017 0945      Component Value Date/Time   TSH 0.50 10/05/2017 0956   TSH 0.52 04/04/2017 0859   TSH 0.436 (L) 09/29/2016 0937      OBESITY BEHAVIORAL INTERVENTION VISIT  Today's visit was # 38  Starting weight: 259 lbs Starting date: 01/27/16 Today's weight : 261 lbs Today's date: 01/16/2018 Total lbs lost to date: 0   ASK: We discussed the diagnosis of obesity with [name] today and [name] agreed to give [name] permission to discuss obesity behavioral modification therapy today.  ASSESS: Raelyn has the diagnosis of obesity and her BMI today is 44.78 Oletta is in the action stage of change   ADVISE: Minette was educated on the multiple health risks of obesity as well as the benefit of weight loss to improve her health. She was advised of the need for long term treatment and the importance of lifestyle modifications to improve her current health and to decrease her risk of future health problems.  AGREE: Multiple dietary  modification options and treatment options were discussed and  Ruweyda agreed to follow the recommendations documented in the above note.  ARRANGE: Shanyia was educated on the importance of frequent visits to treat obesity as outlined per CMS and USPSTF guidelines and agreed to schedule her next follow up appointment today.  I, Tammy  Wysor, am acting as Location manager for [employer], PA-C  I, Abby Potash, PA-C have reviewed above note and agree with its content

## 2018-02-01 MED FILL — ATORVASTATIN CALCIUM 20 MG: 20 | 90 days supply | Qty: 90 | Fill #0

## 2018-02-02 MED FILL — ACCU-CHEK GUIDE STRP: 50 days supply | Qty: 100 | Fill #0

## 2018-02-08 ENCOUNTER — Encounter (INDEPENDENT_AMBULATORY_CARE_PROVIDER_SITE_OTHER): Payer: Self-pay | Admitting: Physician Assistant

## 2018-02-08 ENCOUNTER — Ambulatory Visit (INDEPENDENT_AMBULATORY_CARE_PROVIDER_SITE_OTHER): Payer: 59 | Admitting: Physician Assistant

## 2018-02-08 VITALS — BP 115/77 | HR 96 | Temp 98.4°F | Ht 64.0 in | Wt 261.0 lb

## 2018-02-08 DIAGNOSIS — E119 Type 2 diabetes mellitus without complications: Secondary | ICD-10-CM

## 2018-02-08 DIAGNOSIS — Z6841 Body Mass Index (BMI) 40.0 and over, adult: Secondary | ICD-10-CM | POA: Diagnosis not present

## 2018-02-08 DIAGNOSIS — Z9189 Other specified personal risk factors, not elsewhere classified: Secondary | ICD-10-CM

## 2018-02-08 DIAGNOSIS — I10 Essential (primary) hypertension: Secondary | ICD-10-CM | POA: Diagnosis not present

## 2018-02-08 MED ORDER — LIRAGLUTIDE 18 MG/3ML ~~LOC~~ SOPN: PEN_INJECTOR | SUBCUTANEOUS | 3 refills | Status: DC

## 2018-02-08 MED FILL — VICTOZA 18 MG/3 ML INJECT P: 18 | 30 days supply | Qty: 9 | Fill #0

## 2018-02-12 NOTE — Progress Notes (Signed)
Office: [phone]  /  Fax: [phone]   HPI:   Chief Complaint: OBESITY Veronica Russell is here to discuss her progress with her obesity treatment plan. She is on the Category 2 plan and is following her eating plan approximately 70 % of the time. She states she is exercising 0 minutes 0 times per week. Veronica Russell did well with maintaining her weight over the holidays. She notes drinking a small amount of soda and half sweet tea periodically. She is ready to get back on track. Her weight is 261 lb (118.4 kg) today and she has maintained weight over a period of 3 weeks since her last visit. She has gained 2 lbs since starting treatment with [name].  Diabetes II Veronica Russell has a diagnosis of diabetes type II. She is on Glipizide, Metformin and Victoza. Veronica Russell states fasting BGs range between 110 and 230 and she denies any hypoglycemic episodes. Last A1c was at 7.0 She has been working on intensive lifestyle modifications including diet, exercise, and weight loss to help control her blood glucose levels.  Hypertension Veronica Russell is a 58 y.o. female with hypertension. She is on Amlodipine, Losartan and Propranolol. Veronica Russell denies chest pain. She is working weight loss to help control her blood pressure with the goal of decreasing her risk of heart attack and stroke. [name] is normal.  At risk for cardiovascular disease Veronica Russell is at a higher than average risk for cardiovascular disease due to obesity, diabetes and hypertension. She currently denies any chest pain.  ASSESSMENT AND PLAN:  Type 2 diabetes mellitus without complication, without long-term current use of insulin ([place]) - Plan: liraglutide (VICTOZA) 18 MG/3ML SOPN  Essential hypertension  At risk for heart disease  Class 3 severe obesity with serious comorbidity and body mass index (BMI) of 40.0 to 44.9 in adult, unspecified obesity type (Veronica Russell)  PLAN:  Diabetes II Veronica Russell has been given extensive diabetes education by  myself today including ideal fasting and post-prandial blood glucose readings, individual ideal Hgb A1c goals and hypoglycemia prevention. We discussed the importance of good blood sugar control to decrease the likelihood of diabetic complications such as nephropathy, neuropathy, limb loss, blindness, coronary artery disease, and death. We discussed the importance of intensive lifestyle modification including diet, exercise and weight loss as the first line treatment for diabetes. Veronica Russell agrees to continue her diabetes medications and follow up at the agreed upon time. Prescription was written today for Victoza 1.8 mg subQ daily #3 pens with no refills.  Hypertension We discussed sodium restriction, working on healthy weight loss, and a regular exercise program as the means to achieve improved blood pressure control. Veronica Russell agreed with this plan and agreed to follow up as directed. We will continue to monitor her blood pressure as well as her progress with the above lifestyle modifications. She will continue her medications as prescribed and will watch for signs of hypotension as she continues her lifestyle modifications.  Cardiovascular risk counseling Veronica Russell was given extended (15 minutes) coronary artery disease prevention counseling today. She is 58 y.o. female and has risk factors for heart disease including obesity, diabetes and hypertension. We discussed intensive lifestyle modifications today with an emphasis on specific weight loss instructions and strategies. Pt was also informed of the importance of increasing exercise and decreasing saturated fats to help prevent heart disease.  Obesity Veronica Russell is currently in the action stage of change. As such, her goal is to continue with weight loss efforts She has agreed to  follow the Category 2 plan Veronica Russell has been instructed to work up to a goal of 150 minutes of combined cardio and strengthening exercise per week for weight loss and overall health  benefits. We discussed the following Behavioral Modification Strategies today: keeping healthy foods in the home and work on meal planning and easy cooking plans  Veronica Russell has agreed to follow up with our clinic in 3 to 4 weeks. She was informed of the importance of frequent follow up visits to maximize her success with intensive lifestyle modifications for her multiple health conditions.  ALLERGIES: Allergies  Allergen Reactions  . Milk-Related Compounds     Lactose intollerance    MEDICATIONS: Current Outpatient Medications on File Prior to Visit  Medication Sig Dispense Refill  . albuterol (VENTOLIN HFA) 108 (90 Base) MCG/ACT inhaler Inhale into the lungs every 6 (six) hours as needed for wheezing or shortness of breath.    . Alcohol Swabs (ALCOHOL WIPES) 70 % PADS 30 Packages by Does not apply route 2 (two) times daily. 100 each 0  . amLODipine (NORVASC) 5 MG tablet Take 5 mg by mouth daily.    Veronica Russell Kitchen atorvastatin (LIPITOR) 20 MG tablet Take 20 mg by mouth daily.    . Cholecalciferol 4000 units CAPS Vitamin D3 4,000 unit capsule   1 capsule every day by oral route.    Veronica Russell Kitchen glipiZIDE (GLUCOTROL) 5 MG tablet Take 1 tablet (5 mg total) by mouth daily before supper. 90 tablet 3  . glucose blood (ACCU-CHEK GUIDE) test strip Use twice daily 100 each prn  . hydrochlorothiazide (HYDRODIURIL) 25 MG tablet Take 25 mg by mouth every morning.  1  . Insulin Pen Needle (BD PEN NEEDLE NANO U/F) 32G X 4 MM MISC 1 Package by Does not apply route daily. 100 each prn  . Lancets (ACCU-CHEK SAFE-T PRO) lancets Use as instructed 60 each 0  . losartan (COZAAR) 50 MG tablet losartan 50 mg tablet   1 tablet every day by oral route.    . Melatonin 3 MG TABS Take 1 tablet by mouth at bedtime as needed.    . metFORMIN (GLUCOPHAGE-XR) 500 MG 24 hr tablet Take 2 tablets (1,000 mg total) by mouth at bedtime. 180 tablet 3  . montelukast (SINGULAIR) 10 MG tablet Take 10 mg by mouth at bedtime.    . propranolol (INDERAL) 20  MG tablet Take 20 mg by mouth daily.     Veronica Russell Kitchen omeprazole (PRILOSEC) 20 MG capsule Take 1 capsule (20 mg total) by mouth daily. 30 capsule 3   No current facility-administered medications on file prior to visit.     PAST MEDICAL HISTORY: Past Medical History:  Diagnosis Date  . Alopecia   . Asthma   . Back pain   . Diabetes mellitus ([place])   . GERD (gastroesophageal reflux disease)   . Hyperlipidemia   . Hypertension   . Joint pain   . Lactose intolerance   . Obesity   . Raynauds syndrome   . Swelling   . Tremor   . Vitamin D deficiency     PAST SURGICAL HISTORY: Past Surgical History:  Procedure Laterality Date  . ABDOMINAL HYSTERECTOMY    . BREAST BIOPSY    . MYOMECTOMY      SOCIAL HISTORY: Social History   Tobacco Use  . Smoking status: Never Smoker  . Smokeless tobacco: Never Used  Substance Use Topics  . Alcohol use: No  . Drug use: No    FAMILY HISTORY: Family History  Problem Relation Age of Onset  . Multiple myeloma Mother   . Hypertension Mother   . Hyperlipidemia Mother   . Cancer Mother   . Obesity Mother   . Hypertension Father   . Hyperlipidemia Father   . Cancer Father   . Asthma Other   . Hyperlipidemia Other   . Hypertension Other   . Cancer Other   . COPD Other   . Stroke Other   . Breast cancer Maternal Aunt   . Breast cancer Paternal Aunt   . Breast cancer Maternal Aunt     ROS: Review of Systems  Constitutional: Negative for weight loss.  Cardiovascular: Negative for chest pain.  Endo/Heme/Allergies:       Negative for hypoglycemia    PHYSICAL EXAM: Blood pressure 115/77, pulse 96, temperature 98.4 F (36.9 C), temperature source Oral, height 5' 4" (1.626 m), weight 261 lb (118.4 kg), last menstrual period 08/31/2008, SpO2 99 %. Body mass index is 44.8 kg/m. Physical Exam Vitals signs reviewed.  Constitutional:      Appearance: Normal appearance. She is well-developed. She is obese.  Cardiovascular:     Rate and  Rhythm: Normal rate.  Pulmonary:     Effort: Pulmonary effort is normal.  Musculoskeletal: Normal range of motion.  Skin:    General: Skin is warm and dry.  Neurological:     Mental Status: She is alert and oriented to person, place, and time.  Psychiatric:        Mood and Affect: Mood normal.        Behavior: Behavior normal.     RECENT LABS AND TESTS: BMET    Component Value Date/Time   NA 145 (H) 10/26/2017 0945   K 3.9 10/26/2017 0945   CL 101 10/26/2017 0945   CO2 25 10/26/2017 0945   GLUCOSE 152 (H) 10/26/2017 0945   GLUCOSE 99 09/10/2009 0540   BUN 16 10/26/2017 0945   CREATININE 0.82 10/26/2017 0945   CALCIUM 9.9 10/26/2017 0945   GFRNONAA 79 10/26/2017 0945   GFRAA 91 10/26/2017 0945   Lab Results  Component Value Date   HGBA1C 7.0 (A) 01/10/2018   HGBA1C 7.4 (H) 10/26/2017   HGBA1C 7.1 (A) 10/05/2017   HGBA1C 7.8 05/10/2017   HGBA1C 6.6 (H) 09/29/2016   Lab Results  Component Value Date   INSULIN 56.5 (H) 10/26/2017   INSULIN 39.0 (H) 09/29/2016   INSULIN 38.5 (H) 05/09/2016   INSULIN 27.3 (H) 01/27/2016   CBC    Component Value Date/Time   WBC 6.8 01/27/2016 1006   WBC 11.1 (H) 09/10/2009 0540   RBC 4.28 01/27/2016 1006   RBC 3.89 09/10/2009 0540   HGB 12.1 01/27/2016 1006   HCT 37.0 01/27/2016 1006   PLT 241 09/10/2009 0540   MCV 86 01/27/2016 1006   MCH 28.3 01/27/2016 1006   MCH 29.0 09/10/2009 0540   MCHC 32.7 01/27/2016 1006   MCHC 33.0 09/10/2009 0540   RDW 13.3 01/27/2016 1006   LYMPHSABS 2.0 01/27/2016 1006   EOSABS 0.2 01/27/2016 1006   BASOSABS 0.0 01/27/2016 1006   Iron/TIBC/Ferritin/ %Sat No results found for: IRON, TIBC, FERRITIN, IRONPCTSAT Lipid Panel     Component Value Date/Time   CHOL 162 10/26/2017 0945   TRIG 178 (H) 10/26/2017 0945   HDL 55 10/26/2017 0945   LDLCALC 71 10/26/2017 0945   Hepatic Function Panel     Component Value Date/Time   PROT 7.0 10/26/2017 0945   ALBUMIN 4.1 10/26/2017 0945  AST 18  10/26/2017 0945   ALT 35 (H) 10/26/2017 0945   ALKPHOS 101 10/26/2017 0945   BILITOT 0.3 10/26/2017 0945      Component Value Date/Time   TSH 0.50 10/05/2017 0956   TSH 0.52 04/04/2017 0859   TSH 0.436 (L) 09/29/2016 0937     Ref. Range 10/26/2017 09:45  Vitamin D, 25-Hydroxy Latest Ref Range: 30.0 - 100.0 ng/mL 50.8     OBESITY BEHAVIORAL INTERVENTION VISIT  Today's visit was # 39  Starting weight: 259 lbs Starting date: 01/27/2016 Today's weight : 261 lbs Today's date: 02/08/2018 Total lbs lost to date: 0   ASK: We discussed the diagnosis of obesity with Veronica Russell today and [name] agreed to give [name] permission to discuss obesity behavioral modification therapy today.  ASSESS: Cleota has the diagnosis of obesity and her BMI today is 44.78 Niyana is in the action stage of change   ADVISE: Dearia was educated on the multiple health risks of obesity as well as the benefit of weight loss to improve her health. She was advised of the need for long term treatment and the importance of lifestyle modifications to improve her current health and to decrease her risk of future health problems.  AGREE: Multiple dietary modification options and treatment options were discussed and  Loyal agreed to follow the recommendations documented in the above note.  ARRANGE: Jaydalee was educated on the importance of frequent visits to treat obesity as outlined per CMS and USPSTF guidelines and agreed to schedule her next follow up appointment today.  Corey Skains, am acting as transcriptionist for Abby Potash, PA-C I, Abby Potash, PA-C have reviewed above note and agree with its content

## 2018-02-19 MED FILL — metFORMIN HCL ER 500 MG TB2: 500 | 90 days supply | Qty: 180 | Fill #0

## 2018-02-27 MED FILL — AMLODIPINE BESYLATE 5 MG TA: 5 | 90 days supply | Qty: 90 | Fill #1

## 2018-02-28 MED FILL — PROPRANOLOL 20 MG TABLET: 20 | 90 days supply | Qty: 90 | Fill #1

## 2018-02-28 MED FILL — LOSARTAN POTASSIUM 50 MG TA: 50 | 90 days supply | Qty: 90 | Fill #1

## 2018-02-28 MED FILL — MONTELUKAST SOD 10 MG TAB: 10 | 90 days supply | Qty: 90 | Fill #1

## 2018-03-07 ENCOUNTER — Ambulatory Visit (INDEPENDENT_AMBULATORY_CARE_PROVIDER_SITE_OTHER): Payer: 59 | Admitting: Physician Assistant

## 2018-03-07 ENCOUNTER — Encounter (INDEPENDENT_AMBULATORY_CARE_PROVIDER_SITE_OTHER): Payer: Self-pay | Admitting: Physician Assistant

## 2018-03-07 VITALS — BP 124/83 | HR 100 | Temp 98.2°F | Ht 64.0 in | Wt 262.0 lb

## 2018-03-07 DIAGNOSIS — Z6841 Body Mass Index (BMI) 40.0 and over, adult: Secondary | ICD-10-CM | POA: Diagnosis not present

## 2018-03-07 DIAGNOSIS — E559 Vitamin D deficiency, unspecified: Secondary | ICD-10-CM

## 2018-03-07 DIAGNOSIS — E119 Type 2 diabetes mellitus without complications: Secondary | ICD-10-CM | POA: Diagnosis not present

## 2018-03-07 DIAGNOSIS — E7849 Other hyperlipidemia: Secondary | ICD-10-CM | POA: Diagnosis not present

## 2018-03-07 NOTE — Progress Notes (Signed)
Office: [phone]  /  Fax: [phone]   HPI:   Chief Complaint: OBESITY Veronica Russell is here to discuss her progress with her obesity treatment plan. She is on the Category 2 plan and is following her eating plan approximately 70% of the time. She states she is exercising 0 minutes 0 times per week. Veronica Russell reports that she is struggling to get all of her protein in at dinner. She is sometimes eating milkshakes for snacks.  Her weight is 262 lb (118.8 kg) today and has had a weight gain of 1 pound since her last visit. She has lost 0 lbs since starting treatment with [name].  Diabetes II Veronica Russell has a diagnosis of diabetes type II. Veronica Russell states fasting blood sugars range between 107 and 153. She is currently on metformin and Victoza. No nausea, vomiting, or diarrhea. No polydipsia or any hypoglycemic episodes. Last A1c was 7.0 on 01/10/2018. She has been working on intensive lifestyle modifications including diet, exercise, and weight loss to help control her blood glucose levels.   Hyperlipidemia Veronica Russell has hyperlipidemia and has been trying to improve her cholesterol levels with intensive lifestyle modification including a low saturated fat diet, exercise and weight loss. Veronica Russell is on atorvastatin. She denies any chest pain.   Vitamin D Deficiency Veronica Russell was informed that low Vitamin D levels contributes to fatigue and are associated with obesity, breast, and colon cancer. Veronica Russell takes over-the-counter Vitamin D and will follow-up for routine testing of Vitamin D, at least 2-3 times per year. She was informed of the risk of over-replacement of Vitamin D and agrees to not increase her dose unless she discusses this with [name] first.  ASSESSMENT AND PLAN:  Type 2 diabetes mellitus without complication, without long-term current use of insulin ([place]) - Plan: Comprehensive metabolic panel, Hemoglobin A1c, Insulin, random  Other hyperlipidemia - Plan: Lipid Panel With LDL/HDL Ratio  Vitamin D deficiency -  Plan: VITAMIN D 25 Hydroxy (Vit-D Deficiency, Fractures)  Class 3 severe obesity with serious comorbidity and body mass index (BMI) of 45.0 to 49.9 in adult, unspecified obesity type ([place])  PLAN:  Diabetes II Veronica Russell has been given extensive diabetes education by myself today including ideal fasting and post-prandial blood glucose readings, individual ideal HgA1c goals  and hypoglycemia prevention. We discussed the importance of good blood sugar control to decrease the likelihood of diabetic complications such as nephropathy, neuropathy, limb loss, blindness, coronary artery disease, and death. We discussed the importance of intensive lifestyle modification including diet, exercise and weight loss as the first line treatment for diabetes. [name] agrees to continue her diabetes medications and will follow-up at the agreed upon time. We will check labs today.  Hyperlipidemia Veronica Russell was informed of the American Heart Association Guidelines emphasizing intensive lifestyle modifications as the first line treatment for hyperlipidemia. We discussed many lifestyle modifications today in depth, and Veronica Russell will continue to work on decreasing saturated fats such as fatty red meat, butter and many fried foods. She will also increase vegetables and lean protein in her diet and continue to work on exercise and weight loss efforts. Veronica Russell will continue atorvastatin and agrees to follow-up with our clinic in 3 weeks.  Vitamin D Deficiency Veronica Russell was informed that low vitamin D levels contributes to fatigue and are associated with obesity, breast, and colon cancer. She agrees to continue taking over-the-counter Vit D and will have labs drawn today. She was informed of the risk of over-replacement of vitamin D and agrees to not increase her dose  unless she discusses this with [name] first.  Obesity Veronica Russell is currently in the action stage of change. As such, her goal is to continue with weight loss efforts. She has agreed to  follow the Category 2 plan. Veronica Russell has been instructed to work up to a goal of 150 minutes of combined cardio and strengthening exercise per week for weight loss and overall health benefits. We discussed the following Behavioral Modification Strategies today: increasing lean protein intake and work on meal planning and easy cooking plans.  Veronica Russell has agreed to follow up with our clinic in 3 weeks. She was informed of the importance of frequent follow up visits to maximize her success with intensive lifestyle modifications for her multiple health conditions.  ALLERGIES: Allergies  Allergen Reactions  . Milk-Related Compounds     Lactose intollerance    MEDICATIONS: Current Outpatient Medications on File Prior to Visit  Medication Sig Dispense Refill  . albuterol (VENTOLIN HFA) 108 (90 Base) MCG/ACT inhaler Inhale into the lungs every 6 (six) hours as needed for wheezing or shortness of breath.    . Alcohol Swabs (ALCOHOL WIPES) 70 % PADS 30 Packages by Does not apply route 2 (two) times daily. 100 each 0  . amLODipine (NORVASC) 5 MG tablet Take 5 mg by mouth daily.    Marland Kitchen atorvastatin (LIPITOR) 20 MG tablet Take 20 mg by mouth daily.    . Cholecalciferol 4000 units CAPS Vitamin D3 4,000 unit capsule   1 capsule every day by oral route.    Marland Kitchen glipiZIDE (GLUCOTROL) 5 MG tablet Take 1 tablet (5 mg total) by mouth daily before supper. 90 tablet 3  . glucose blood (ACCU-CHEK GUIDE) test strip Use twice daily 100 each prn  . hydrochlorothiazide (HYDRODIURIL) 25 MG tablet Take 25 mg by mouth every morning.  1  . Insulin Pen Needle (BD PEN NEEDLE NANO U/F) 32G X 4 MM MISC 1 Package by Does not apply route daily. 100 each prn  . Lancets (ACCU-CHEK SAFE-T PRO) lancets Use as instructed 60 each 0  . liraglutide (VICTOZA) 18 MG/3ML SOPN INJECT 1.8 MGS INTO THE SKIN EVERY MORNING. 9 mL 3  . losartan (COZAAR) 50 MG tablet losartan 50 mg tablet   1 tablet every day by oral route.    . Melatonin 3 MG TABS  Take 1 tablet by mouth at bedtime as needed.    . metFORMIN (GLUCOPHAGE-XR) 500 MG 24 hr tablet Take 2 tablets (1,000 mg total) by mouth at bedtime. 180 tablet 3  . montelukast (SINGULAIR) 10 MG tablet Take 10 mg by mouth at bedtime.    . propranolol (INDERAL) 20 MG tablet Take 20 mg by mouth daily.     Marland Kitchen omeprazole (PRILOSEC) 20 MG capsule Take 1 capsule (20 mg total) by mouth daily. 30 capsule 3   No current facility-administered medications on file prior to visit.     PAST MEDICAL HISTORY: Past Medical History:  Diagnosis Date  . Alopecia   . Asthma   . Back pain   . Diabetes mellitus ([place])   . GERD (gastroesophageal reflux disease)   . Hyperlipidemia   . Hypertension   . Joint pain   . Lactose intolerance   . Obesity   . Raynauds syndrome   . Swelling   . Tremor   . Vitamin D deficiency     PAST SURGICAL HISTORY: Past Surgical History:  Procedure Laterality Date  . ABDOMINAL HYSTERECTOMY    . BREAST BIOPSY    .  MYOMECTOMY      SOCIAL HISTORY: Social History   Tobacco Use  . Smoking status: Never Smoker  . Smokeless tobacco: Never Used  Substance Use Topics  . Alcohol use: No  . Drug use: No    FAMILY HISTORY: Family History  Problem Relation Age of Onset  . Multiple myeloma Mother   . Hypertension Mother   . Hyperlipidemia Mother   . Cancer Mother   . Obesity Mother   . Hypertension Father   . Hyperlipidemia Father   . Cancer Father   . Asthma Other   . Hyperlipidemia Other   . Hypertension Other   . Cancer Other   . COPD Other   . Stroke Other   . Breast cancer Maternal Aunt   . Breast cancer Paternal Aunt   . Breast cancer Maternal Aunt     ROS: Review of Systems  Constitutional: Negative for weight loss.  Cardiovascular: Negative for chest pain.  Gastrointestinal: Negative for diarrhea, nausea and vomiting.  Musculoskeletal:       Negative muscle weakness  Endo/Heme/Allergies: Negative for polydipsia.   PHYSICAL EXAM: Blood  pressure 124/83, pulse 100, temperature 98.2 F (36.8 C), temperature source Oral, height '5\' 4"'$  (1.626 m), weight 262 lb (118.8 kg), last menstrual period 08/31/2008, SpO2 98 %. Body mass index is 44.97 kg/m. Physical Exam Vitals signs reviewed.  Constitutional:      Appearance: Normal appearance. She is obese.  Cardiovascular:     Rate and Rhythm: Normal rate.     Pulses: Normal pulses.  Pulmonary:     Effort: Pulmonary effort is normal.     Breath sounds: Normal breath sounds.  Musculoskeletal: Normal range of motion.  Skin:    General: Skin is warm and dry.  Neurological:     Mental Status: She is alert and oriented to person, place, and time.  Psychiatric:        Mood and Affect: Mood normal.        Behavior: Behavior normal.   RECENT LABS AND TESTS: BMET    Component Value Date/Time   NA 145 (H) 10/26/2017 0945   K 3.9 10/26/2017 0945   CL 101 10/26/2017 0945   CO2 25 10/26/2017 0945   GLUCOSE 152 (H) 10/26/2017 0945   GLUCOSE 99 09/10/2009 0540   BUN 16 10/26/2017 0945   CREATININE 0.82 10/26/2017 0945   CALCIUM 9.9 10/26/2017 0945   GFRNONAA 79 10/26/2017 0945   GFRAA 91 10/26/2017 0945   Lab Results  Component Value Date   HGBA1C 7.0 (A) 01/10/2018   HGBA1C 7.4 (H) 10/26/2017   HGBA1C 7.1 (A) 10/05/2017   HGBA1C 7.8 05/10/2017   HGBA1C 6.6 (H) 09/29/2016   Lab Results  Component Value Date   INSULIN 56.5 (H) 10/26/2017   INSULIN 39.0 (H) 09/29/2016   INSULIN 38.5 (H) 05/09/2016   INSULIN 27.3 (H) 01/27/2016   CBC    Component Value Date/Time   WBC 6.8 01/27/2016 1006   WBC 11.1 (H) 09/10/2009 0540   RBC 4.28 01/27/2016 1006   RBC 3.89 09/10/2009 0540   HGB 12.1 01/27/2016 1006   HCT 37.0 01/27/2016 1006   PLT 241 09/10/2009 0540   MCV 86 01/27/2016 1006   MCH 28.3 01/27/2016 1006   MCH 29.0 09/10/2009 0540   MCHC 32.7 01/27/2016 1006   MCHC 33.0 09/10/2009 0540   RDW 13.3 01/27/2016 1006   LYMPHSABS 2.0 01/27/2016 1006   EOSABS 0.2  01/27/2016 1006   BASOSABS 0.0 01/27/2016  1006   Iron/TIBC/Ferritin/ %Sat No results found for: IRON, TIBC, FERRITIN, IRONPCTSAT Lipid Panel     Component Value Date/Time   CHOL 162 10/26/2017 0945   TRIG 178 (H) 10/26/2017 0945   HDL 55 10/26/2017 0945   LDLCALC 71 10/26/2017 0945   Hepatic Function Panel     Component Value Date/Time   PROT 7.0 10/26/2017 0945   ALBUMIN 4.1 10/26/2017 0945   AST 18 10/26/2017 0945   ALT 35 (H) 10/26/2017 0945   ALKPHOS 101 10/26/2017 0945   BILITOT 0.3 10/26/2017 0945      Component Value Date/Time   TSH 0.50 10/05/2017 0956   TSH 0.52 04/04/2017 0859   TSH 0.436 (L) 09/29/2016 0937   Results for ARANDA, BIHM (MRN [id number]) as of 03/07/2018 11:40  Ref. Range 10/26/2017 09:45  Vitamin D, 25-Hydroxy Latest Ref Range: 30.0 - 100.0 ng/mL 50.8   OBESITY BEHAVIORAL INTERVENTION VISIT  Today's visit was #40  Starting weight: 259 lbs Starting date: 01/27/2016 Today's weight: 262 lbs Today's date: 03/07/2018 Total lbs lost to date: 0 At least 15 minutes were spent on discussing the following behavioral intervention visit.  ASK: We discussed the diagnosis of obesity with Veronica Russell today and Veronica Russell agreed to give [name] permission to discuss obesity behavioral modification therapy today.  ASSESS: Veronica Russell has the diagnosis of obesity and her BMI today is 44.97. Veronica Russell is in the action stage of change.   ADVISE: Veronica Russell was educated on the multiple health risks of obesity as well as the benefit of weight loss to improve her health. She was advised of the need for long term treatment and the importance of lifestyle modifications to improve her current health and to decrease her risk of future health problems.  AGREE: Multiple dietary modification options and treatment options were discussed and  Veronica Russell agreed to follow the recommendations documented in the above note.  ARRANGE: Veronica Russell was educated on the importance of frequent visits to  treat obesity as outlined per CMS and USPSTF guidelines and agreed to schedule her next follow up appointment today.  Migdalia Dk, am acting as transcriptionist for Abby Potash, PA-C I, Abby Potash, PA-C have reviewed above note and agree with its content

## 2018-03-08 LAB — LIPID PANEL WITH LDL/HDL RATIO
Cholesterol, Total: 165 mg/dL (ref 100–199)
HDL: 54 mg/dL (ref >39)
LDL Calculated: 83 mg/dL (ref 0–99)
LDl/HDL Ratio: 1.5 ratio (ref 0.0–3.2)
Triglycerides: 138 mg/dL (ref 0–149)
VLDL Cholesterol Cal: 28 mg/dL (ref 5–40)

## 2018-03-08 LAB — COMPREHENSIVE METABOLIC PANEL
ALBUMIN: 4.1 g/dL (ref 3.8–4.9)
ALK PHOS: 95 IU/L (ref 39–117)
ALT: 32 IU/L (ref 0–32)
AST: 19 IU/L (ref 0–40)
Albumin/Globulin Ratio: 1.5 (ref 1.2–2.2)
BUN / CREAT RATIO: 16 (ref 9–23)
BUN: 13 mg/dL (ref 6–24)
Bilirubin Total: 0.3 mg/dL (ref 0.0–1.2)
CO2: 25 mmol/L (ref 20–29)
CREATININE: 0.8 mg/dL (ref 0.57–1.00)
Calcium: 10 mg/dL (ref 8.7–10.2)
Chloride: 101 mmol/L (ref 96–106)
GFR calc Af Amer: 94 mL/min/{1.73_m2} (ref >59)
GFR calc non Af Amer: 82 mL/min/{1.73_m2} (ref >59)
GLUCOSE: 135 mg/dL — AB (ref 65–99)
Globulin, Total: 2.8 g/dL (ref 1.5–4.5)
Potassium: 3.9 mmol/L (ref 3.5–5.2)
Sodium: 143 mmol/L (ref 134–144)
TOTAL PROTEIN: 6.9 g/dL (ref 6.0–8.5)

## 2018-03-08 LAB — INSULIN, RANDOM: INSULIN: 43.1 u[IU]/mL — ABNORMAL HIGH (ref 2.6–24.9)

## 2018-03-08 LAB — VITAMIN D 25 HYDROXY (VIT D DEFICIENCY, FRACTURES): VIT D 25 HYDROXY: 44.9 ng/mL (ref 30.0–100.0)

## 2018-03-08 LAB — HEMOGLOBIN A1C
Est. average glucose Bld gHb Est-mCnc: 160 mg/dL
HEMOGLOBIN A1C: 7.2 % — AB (ref 4.8–5.6)

## 2018-03-12 ENCOUNTER — Ambulatory Visit (INDEPENDENT_AMBULATORY_CARE_PROVIDER_SITE_OTHER): Payer: Self-pay | Admitting: Physician Assistant

## 2018-03-12 ENCOUNTER — Encounter: Payer: Self-pay | Admitting: Physician Assistant

## 2018-03-12 VITALS — BP 140/84 | HR 112 | Temp 99.9°F | Resp 14 | Wt 268.0 lb

## 2018-03-12 DIAGNOSIS — R69 Illness, unspecified: Secondary | ICD-10-CM

## 2018-03-12 DIAGNOSIS — J111 Influenza due to unidentified influenza virus with other respiratory manifestations: Secondary | ICD-10-CM

## 2018-03-12 MED ORDER — BENZONATATE 100 MG PO CAPS: 100.0000 mg | ORAL_CAPSULE | Freq: Three times a day (TID) | ORAL | 0 refills | Status: DC | PRN

## 2018-03-12 MED ORDER — OSELTAMIVIR PHOSPHATE 75 MG PO CAPS: 75.0000 mg | ORAL_CAPSULE | Freq: Two times a day (BID) | ORAL | 0 refills | Status: DC

## 2018-03-12 MED ORDER — ALBUTEROL SULFATE HFA 108 (90 BASE) MCG/ACT IN AERS: 2.0000 | INHALATION_SPRAY | Freq: Four times a day (QID) | RESPIRATORY_TRACT | 0 refills | Status: AC | PRN

## 2018-03-12 MED FILL — OSELTAMIVIR PHOSPHATE 75 MG: 75 | 5 days supply | Qty: 10 | Fill #0

## 2018-03-12 MED FILL — VENTOLIN HFA 90 MCG INHALER: 108 (90 BAS | 25 days supply | Qty: 18 | Fill #0

## 2018-03-12 MED FILL — BENZONATATE 100 MG CAPS: 100 | 7 days supply | Qty: 40 | Fill #0

## 2018-03-12 NOTE — Progress Notes (Signed)
MRN: [id number] DOB: Jan 12, 1961  Subjective:   Veronica Russell is a 58 y.o. female presenting for chief complaint of cough, fever and body aches (x1 day (advil)) .  Reports sudden onset body aches, fever (Tmax 101.8), nasal congestion, and dry cough starting yesterday. Had some wheezing yesterday, not today. Used her albuterol inhaler-which may be expired- with relief. Has also tried tylenol with some relief. denies sinus pain, sore throat, inability to swallow, voice change, productive cough, shortness of breath and chest pain, nausea, vomiting, abdominal pain and diarrhea. Had sick exposure to friend with the flu 2 days ago. Has hx of borderline asthma?-has never been on maintenance inhaler, T2DM (last A1C 03/07/18 was 7.2), HTN, and HLD. Denies smoking. Denies recent travel. Had flu shot this season.  Denies any other aggravating or relieving factors, no other questions or concerns.   Review of Systems  Constitutional: Negative for diaphoresis.  Respiratory: Negative for hemoptysis, sputum production and stridor.   Cardiovascular: Negative for chest pain and leg swelling.  Skin: Negative for rash.  Neurological: Negative for dizziness.    Veronica Russell has a current medication list which includes the following prescription(s): albuterol, alcohol wipes, amlodipine, atorvastatin, cholecalciferol, glipizide, glucose blood, hydrochlorothiazide, insulin pen needle, accu-chek safe-t pro, liraglutide, losartan, melatonin, metformin, montelukast, omeprazole, and propranolol. Also is allergic to milk-related compounds.  Veronica Russell  has a past medical history of Alopecia, Asthma, Back pain, Diabetes mellitus ([place]), GERD (gastroesophageal reflux disease), Hyperlipidemia, Hypertension, Joint pain, Lactose intolerance, Obesity, Raynauds syndrome, Swelling, Tremor, and Vitamin D deficiency. Also  has a past surgical history that includes Abdominal hysterectomy; Myomectomy; and Breast biopsy.   Objective:   Vitals: BP  140/84   Pulse (!) 112   Temp 99.9 F (37.7 C)   Resp 14   Wt 268 lb (121.6 kg)   LMP 08/31/2008   SpO2 98%   BMI 46.00 kg/m   Physical Exam Vitals signs reviewed.  Constitutional:      General: She is not in acute distress.    Appearance: She is well-developed. She is not ill-appearing.  HENT:     Head: Normocephalic and atraumatic.     Right Ear: Tympanic membrane, ear canal and external ear normal.     Left Ear: Tympanic membrane, ear canal and external ear normal.     Nose: Congestion and rhinorrhea (clear) present.     Right Sinus: No maxillary sinus tenderness or frontal sinus tenderness.     Left Sinus: No maxillary sinus tenderness or frontal sinus tenderness.     Mouth/Throat:     Lips: Pink.     Mouth: Mucous membranes are moist.     Pharynx: Oropharynx is clear. Uvula midline.     Tonsils: No tonsillar exudate or tonsillar abscesses.  Eyes:     Conjunctiva/sclera: Conjunctivae normal.  Neck:     Musculoskeletal: Normal range of motion.  Cardiovascular:     Rate and Rhythm: Regular rhythm. Tachycardia present.     Heart sounds: Normal heart sounds.  Pulmonary:     Effort: Pulmonary effort is normal. No accessory muscle usage or respiratory distress.     Breath sounds: Normal breath sounds. No stridor. No decreased breath sounds, wheezing, rhonchi or rales.  Lymphadenopathy:     Head:     Right side of head: No submental, submandibular, tonsillar, preauricular, posterior auricular or occipital adenopathy.     Left side of head: No submental, submandibular, tonsillar, preauricular, posterior auricular or occipital adenopathy.     Cervical: No  cervical adenopathy.     Upper Body:     Right upper body: No supraclavicular adenopathy.     Left upper body: No supraclavicular adenopathy.  Skin:    General: Skin is warm and dry.  Neurological:     Mental Status: She is alert.    Pulse Readings from Last 3 Encounters:  03/12/18 (!) 112  03/07/18 100  02/08/18 96      No results found for this or any previous visit (from the past 24 hour(s)).  Assessment and Plan :  1. Influenza-like illness Overall well appearing, NAD. Slightly tachycardic at 112bpm,likley due to illness-pt reports her pulse typically runs "higher." Last reported pulse on 03/07/18 was 100bpm. Denies chest pain, palpitations, SOB, or DOE. Temp 99.9-no recent use of antipyretics. SpO2 98%. Lungs CTAB-no wheezing ausculted. Her hx, symptom set, and recent close contact exposure to influenza makes influenza the most likely dx. With her comorbidities, would recommend antiviral therapy along with suppporitve measures. Encouraged rest, hydration, and to continue OTC tylenol or ibuprofen as prescribed for fever. Educated on proper Comptroller. Recommended wearing a mask daily especially around other people. Remain out of work until afebrile for 48 hours w/o use of antipyretics. Educated on potential complications of the flu. Advised to follow up with family doctor, local urgent care, or ED if develops any of these concerning symptoms or if current symptoms persist outside of discussed boundaries. - albuterol (VENTOLIN HFA) 108 (90 Base) MCG/ACT inhaler; Inhale 2 puffs into the lungs every 6 (six) hours as needed for wheezing or shortness of breath.  Dispense: 1 Inhaler; Refill: 0 - benzonatate (TESSALON) 100 MG capsule; Take 1-2 capsules (100-200 mg total) by mouth 3 (three) times daily as needed for cough.  Dispense: 40 capsule; Refill: 0 - oseltamivir (TAMIFLU) 75 MG capsule; Take 1 capsule (75 mg total) by mouth 2 (two) times daily.  Dispense: 10 capsule; Refill: [facility] 03/12/2018 9:58 AM

## 2018-03-12 NOTE — Patient Instructions (Addendum)
Influenza, Adult  Continue to rest, drink fluids, and eat light meals.  Take tamiflu as prescribed. Use over the counter ibuprofen or tylenol as prescribed for fever and general discomfort. Use albuterol inhaler every 4-6 hours as needed for wheezing and shortness of breath.  Use tessalon perles for daytime cough. May use over the counter coricidin cough syrup if needed. -Two major complications after the flu are pneumonia and sinus infections. Please be aware of this and if you are not any better in 7-10 days or you develop worsening cough or sinus pressure, seek care at urgent care or the ED.  -Continue to wash your hands and wear a mask daily especially around other people.  -If you develop any new chest pain, shortness of breath, productive cough, or other concerning symptoms seek care immediately at the ED.    Influenza is also called "the flu." It is an infection in the lungs, nose, and throat (respiratory tract). It is caused by a virus. The flu causes symptoms that are similar to symptoms of a cold. It also causes a high fever and body aches. The flu spreads easily from person to person (is contagious). Getting a flu shot (influenza vaccination) every year is the best way to prevent the flu. What are the causes? This condition is caused by the influenza virus. You can get the virus by:  Breathing in droplets that are in the air from the cough or sneeze of a person who has the virus.  Touching something that has the virus on it (is contaminated) and then touching your mouth, nose, or eyes. What increases the risk? Certain things may make you more likely to get the flu. These include:  Not washing your hands often.  Having close contact with many people during cold and flu season.  Touching your mouth, eyes, or nose without first washing your hands.  Not getting a flu shot every year. You may have a higher risk for the flu, along with serious problems such as a lung infection  (pneumonia), if you:  Are older than 65.  Are pregnant.  Have a weakened disease-fighting system (immune system) because of a disease or taking certain medicines.  Have a long-term (chronic) illness, such as: ? Heart, kidney, or lung disease. ? Diabetes. ? Asthma.  Have a liver disorder.  Are very overweight (morbidly obese).  Have anemia. This is a condition that affects your red blood cells. What are the signs or symptoms? Symptoms usually begin suddenly and last 4-14 days. They may include:  Fever and chills.  Headaches, body aches, or muscle aches.  Sore throat.  Cough.  Runny or stuffy (congested) nose.  Chest discomfort.  Not wanting to eat as much as normal (poor appetite).  Weakness or feeling tired (fatigue).  Dizziness.  Feeling sick to your stomach (nauseous) or throwing up (vomiting). How is this treated? If the flu is found early, you can be treated with medicine that can help reduce how bad the illness is and how long it lasts (antiviral medicine). This may be given by mouth (orally) or through an IV tube. Taking care of yourself at home can help your symptoms get better. Your doctor may suggest:  Taking over-the-counter medicines.  Drinking plenty of fluids. The flu often goes away on its own. If you have very bad symptoms or other problems, you may be treated in a hospital. Follow these instructions at home:     Activity  Rest as needed. Get plenty of sleep.  Stay home from work or school as told by your doctor. ? Do not leave home until you do not have a fever for 24 hours without taking medicine. ? Leave home only to visit your doctor. Eating and drinking  Take an ORS (oral rehydration solution). This is a drink that is sold at pharmacies and stores.  Drink enough fluid to keep your pee (urine) pale yellow.  Drink clear fluids in small amounts as you are able. Clear fluids include: ? Water. ? Ice chips. ? Fruit juice that has water  added (diluted fruit juice). ? Low-calorie sports drinks.  Eat bland, easy-to-digest foods in small amounts as you are able. These foods include: ? Bananas. ? Applesauce. ? Rice. ? Lean meats. ? Toast. ? Crackers.  Do not eat or drink: ? Fluids that have a lot of sugar or caffeine. ? Alcohol. ? Spicy or fatty foods. General instructions  Take over-the-counter and prescription medicines only as told by your doctor.  Use a cool mist humidifier to add moisture to the air in your home. This can make it easier for you to breathe.  Cover your mouth and nose when you cough or sneeze.  Wash your hands with soap and water often, especially after you cough or sneeze. If you cannot use soap and water, use alcohol-based hand sanitizer.  Keep all follow-up visits as told by your doctor. This is important. How is this prevented?   Get a flu shot every year. You may get the flu shot in late summer, fall, or winter. Ask your doctor when you should get your flu shot.  Avoid contact with people who are sick during fall and winter (cold and flu season). Contact a doctor if:  You get new symptoms.  You have: ? Chest pain. ? Watery poop (diarrhea). ? A fever.  Your cough gets worse.  You start to have more mucus.  You feel sick to your stomach.  You throw up. Get help right away if you:  Have shortness of breath.  Have trouble breathing.  Have skin or nails that turn a bluish color.  Have very bad pain or stiffness in your neck.  Get a sudden headache.  Get sudden pain in your face or ear.  Cannot eat or drink without throwing up. Summary  Influenza ("the flu") is an infection in the lungs, nose, and throat. It is caused by a virus.  Take over-the-counter and prescription medicines only as told by your doctor.  Getting a flu shot every year is the best way to avoid getting the flu. This information is not intended to replace advice given to you by your health care  provider. Make sure you discuss any questions you have with your health care provider. Document Released: 10/27/2007 Document Revised: 07/05/2017 Document Reviewed: 07/05/2017 Elsevier Interactive Patient Education  2019 [employer].

## 2018-03-14 ENCOUNTER — Telehealth: Payer: Self-pay

## 2018-03-14 NOTE — Telephone Encounter (Signed)
Patient did not answered the phone, I left a message asking to call us back.  

## 2018-03-15 ENCOUNTER — Emergency Department (HOSPITAL_COMMUNITY): Payer: 59

## 2018-03-15 ENCOUNTER — Other Ambulatory Visit: Payer: Self-pay

## 2018-03-15 ENCOUNTER — Emergency Department (HOSPITAL_COMMUNITY)
Admission: EM | Admit: 2018-03-15 | Discharge: 2018-03-16 | Disposition: A | Discharge status: Home / Self Care | Payer: 59 | Attending: Emergency Medicine | Admitting: Emergency Medicine

## 2018-03-15 ENCOUNTER — Encounter (HOSPITAL_COMMUNITY): Payer: Self-pay

## 2018-03-15 DIAGNOSIS — I10 Essential (primary) hypertension: Secondary | ICD-10-CM | POA: Diagnosis not present

## 2018-03-15 DIAGNOSIS — Z794 Long term (current) use of insulin: Secondary | ICD-10-CM | POA: Diagnosis not present

## 2018-03-15 DIAGNOSIS — E119 Type 2 diabetes mellitus without complications: Secondary | ICD-10-CM | POA: Diagnosis not present

## 2018-03-15 DIAGNOSIS — J9811 Atelectasis: Secondary | ICD-10-CM | POA: Diagnosis not present

## 2018-03-15 DIAGNOSIS — R000 Tachycardia, unspecified: Secondary | ICD-10-CM | POA: Diagnosis not present

## 2018-03-15 DIAGNOSIS — J40 Bronchitis, not specified as acute or chronic: Secondary | ICD-10-CM

## 2018-03-15 DIAGNOSIS — J209 Acute bronchitis, unspecified: Secondary | ICD-10-CM | POA: Diagnosis not present

## 2018-03-15 DIAGNOSIS — Z79899 Other long term (current) drug therapy: Secondary | ICD-10-CM | POA: Diagnosis not present

## 2018-03-15 DIAGNOSIS — J101 Influenza due to other identified influenza virus with other respiratory manifestations: Secondary | ICD-10-CM | POA: Diagnosis not present

## 2018-03-15 DIAGNOSIS — R0602 Shortness of breath: Secondary | ICD-10-CM | POA: Diagnosis present

## 2018-03-15 DIAGNOSIS — R05 Cough: Secondary | ICD-10-CM | POA: Diagnosis not present

## 2018-03-15 DIAGNOSIS — R062 Wheezing: Secondary | ICD-10-CM | POA: Diagnosis not present

## 2018-03-15 LAB — COMPREHENSIVE METABOLIC PANEL
ALT: 49 U/L — ABNORMAL HIGH (ref 0–44)
AST: 36 U/L (ref 15–41)
Albumin: 3.7 g/dL (ref 3.5–5.0)
Alkaline Phosphatase: 67 U/L (ref 38–126)
Anion gap: 10 (ref 5–15)
BUN: 10 mg/dL (ref 6–20)
CO2: 32 mmol/L (ref 22–32)
Calcium: 9.7 mg/dL (ref 8.9–10.3)
Chloride: 98 mmol/L (ref 98–111)
Creatinine, Ser: 0.8 mg/dL (ref 0.44–1.00)
GFR calc non Af Amer: >60 mL/min (ref >60)
Glucose, Bld: 161 mg/dL — ABNORMAL HIGH (ref 70–99)
Potassium: 3.2 mmol/L — ABNORMAL LOW (ref 3.5–5.1)
Sodium: 140 mmol/L (ref 135–145)
Total Bilirubin: 0.9 mg/dL (ref 0.3–1.2)
Total Protein: 7.3 g/dL (ref 6.5–8.1)

## 2018-03-15 LAB — CBC
HCT: 44.4 % (ref 36.0–46.0)
Hemoglobin: 14.1 g/dL (ref 12.0–15.0)
MCH: 28.3 pg (ref 26.0–34.0)
MCHC: 31.8 g/dL (ref 30.0–36.0)
MCV: 89 fL (ref 80.0–100.0)
Platelets: 216 10*3/uL (ref 150–400)
RBC: 4.99 MIL/uL (ref 3.87–5.11)
RDW: 13.4 % (ref 11.5–15.5)
WBC: 6.4 10*3/uL (ref 4.0–10.5)
nRBC: 0 % (ref 0.0–0.2)

## 2018-03-15 LAB — TROPONIN I

## 2018-03-15 LAB — BRAIN NATRIURETIC PEPTIDE: B Natriuretic Peptide: 8.7 pg/mL (ref 0.0–100.0)

## 2018-03-15 MED ORDER — PROPRANOLOL HCL 20 MG PO TABS
20.0000 mg | ORAL_TABLET | Freq: Once | ORAL | Status: AC
  Administered 2018-03-15: 20 mg via ORAL
  Filled 2018-03-15: qty 1

## 2018-03-15 MED ORDER — ALBUTEROL SULFATE HFA 108 (90 BASE) MCG/ACT IN AERS
2.0000 | INHALATION_SPRAY | RESPIRATORY_TRACT | Status: DC | PRN
  Filled 2018-03-15: qty 6.7

## 2018-03-15 MED ORDER — IPRATROPIUM-ALBUTEROL 0.5-2.5 (3) MG/3ML IN SOLN
3.0000 mL | Freq: Once | RESPIRATORY_TRACT | Status: AC
  Administered 2018-03-15: 3 mL via RESPIRATORY_TRACT
  Filled 2018-03-15: qty 3

## 2018-03-15 MED ORDER — AEROCHAMBER PLUS FLO-VU LARGE MISC
1.0000 | Freq: Once | Status: AC
  Administered 2018-03-16: 1

## 2018-03-15 MED ORDER — PREDNISONE 20 MG PO TABS
40.0000 mg | ORAL_TABLET | Freq: Once | ORAL | Status: AC
  Administered 2018-03-15: 40 mg via ORAL
  Filled 2018-03-15: qty 2

## 2018-03-15 NOTE — ED Triage Notes (Signed)
PT reports cough/SOB w/ wheezing x5 days. Presented today to UC for eval of same. Pt was given breathing tx and CXR which demonstrated atelectasis and pulm vascular congestion. Pt reports ongoing SOB and wheezing in triage. NARD, satting 94 on RA>.

## 2018-03-15 NOTE — ED Provider Notes (Signed)
[facility] EMERGENCY DEPARTMENT Provider Note   CSN: [id number] Arrival date & time: 03/15/18  1706     History   Chief Complaint Chief Complaint  Patient presents with  . Shortness of Breath    HPI Veronica Russell is a 58 y.o. female.  58yo F w/ PMH including T2DM, HTN, HLD who p/w cough and wheezing. 4 days ago, she began having dry cough associated w/ body aches, fatigue, and fevers up to 102. She went to UC 3 days ago and was given tamiflu, tessalon, and inhaler which she has been taking. She started feeling better, fevers broke yesterday. Today, body aches have resolved but her wheezing has been louder. She went to PCP walk-in clinic today for the wheezing. She had a breathing treatment x 2 and was still wheezing so they got CXR. CXR showed atelectasis and pulmonary vascular congestion so they sent her here for further evaluation. No chest pain, vomiting, LE edema, recent travel, h/o blood clots, h/o cancer.   The history is provided by the patient.  Shortness of Breath    Past Medical History:  Diagnosis Date  . Alopecia   . Asthma   . Back pain   . Diabetes mellitus ([place])   . GERD (gastroesophageal reflux disease)   . Hyperlipidemia   . Hypertension   . Joint pain   . Lactose intolerance   . Obesity   . Raynauds syndrome   . Swelling   . Tremor   . Vitamin D deficiency     Patient Active Problem List   Diagnosis Date Noted  . 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 Multiple thyroid nodules 04/04/2017  . Subclinical hyperthyroidism 11/30/2016  . Goiter 11/30/2016  . Class 3 obesity with serious comorbidity and body mass index (BMI) of 40.0 to 44.9 in adult 09/01/2016  . Type 2 diabetes mellitus without complication, without long-term current use of insulin ([place]) 06/29/2016  . Essential hypertension 06/29/2016  . Morbid obesity ([place]) 03/07/2016    Past Surgical History:  Procedure Laterality Date  . ABDOMINAL HYSTERECTOMY      . BREAST BIOPSY    . MYOMECTOMY       OB History    Gravida  0   Para  0   Term  0   Preterm  0   AB  0   Living  0     SAB  0   TAB  0   Ectopic  0   Multiple  0   Live Births  0            Home Medications    Prior to Admission medications   Medication Sig Start Date End Date Taking? Authorizing Provider  albuterol (VENTOLIN HFA) 108 (90 Base) MCG/ACT inhaler Inhale 2 puffs into the lungs every 6 (six) hours as needed for wheezing or shortness of breath. 03/12/18  Yes Timmothy Euler, [place] D, PA-C  amLODipine (NORVASC) 5 MG tablet Take 5 mg by mouth daily.   Yes [provider]  atorvastatin (LIPITOR) 20 MG tablet Take 20 mg by mouth daily.   Yes [provider]  benzonatate (TESSALON) 100 MG capsule Take 1-2 capsules (100-200 mg total) by mouth 3 (three) times daily as needed for cough. 03/12/18  Yes Leonie Douglas, PA-C  Cholecalciferol 4000 units CAPS Take 4,000 Units by mouth daily.    Yes [provider]  glipiZIDE (GLUCOTROL) 5 MG tablet Take 1 tablet (5 mg total) by mouth daily  before supper. 01/10/18  Yes Philemon Kingdom, MD  hydrochlorothiazide (HYDRODIURIL) 25 MG tablet Take 25 mg by mouth every morning. 09/20/17  Yes [provider]  liraglutide (VICTOZA) 18 MG/3ML SOPN INJECT 1.8 MGS INTO THE SKIN EVERY MORNING. Patient taking differently: Inject 1.8 mg into the skin daily. EVERY MORNING. 02/08/18  Yes Abby Potash, PA-C  losartan (COZAAR) 50 MG tablet Take 50 mg by mouth daily.  02/24/17  Yes [provider]  Melatonin 3 MG TABS Take 1 tablet by mouth at bedtime as needed.   Yes [provider]  metFORMIN (GLUCOPHAGE-XR) 500 MG 24 hr tablet Take 2 tablets (1,000 mg total) by mouth at bedtime. 01/10/18  Yes Philemon Kingdom, MD  montelukast (SINGULAIR) 10 MG tablet Take 10 mg by mouth at bedtime.   Yes [provider]  oseltamivir (TAMIFLU) 75 MG capsule Take 1 capsule (75 mg total) by  mouth 2 (two) times daily. 03/12/18  Yes Timmothy Euler, [place] D, PA-C  propranolol (INDERAL) 20 MG tablet Take 20 mg by mouth daily.    Yes [provider]  Alcohol Swabs (ALCOHOL WIPES) 70 % PADS 30 Packages by Does not apply route 2 (two) times daily. 05/30/17   Waldon Merl, PA-C  doxycycline (VIBRAMYCIN) 100 MG capsule Take 1 capsule (100 mg total) by mouth 2 (two) times daily for 7 days. 03/16/18 03/23/18  Jonte Shiller, Wenda Overland, MD  glucose blood (ACCU-CHEK GUIDE) test strip Use twice daily 01/10/18   Philemon Kingdom, MD  Insulin Pen Needle (BD PEN NEEDLE NANO U/F) 32G X 4 MM MISC 1 Package by Does not apply route daily. 01/10/18   Philemon Kingdom, MD  Lancets (ACCU-CHEK SAFE-T PRO) lancets Use as instructed 09/19/17   Abby Potash, PA-C  predniSONE (DELTASONE) 20 MG tablet Take 2 tablets (40 mg total) by mouth daily. 03/16/18   Sherilynn Dieu, Wenda Overland, MD    Family History Family History  Problem Relation Age of Onset  . Multiple myeloma Mother   . Hypertension Mother   . Hyperlipidemia Mother   . Cancer Mother   . Obesity Mother   . Hypertension Father   . Hyperlipidemia Father   . Cancer Father   . Asthma Other   . Hyperlipidemia Other   . Hypertension Other   . Cancer Other   . COPD Other   . Stroke Other   . Breast cancer Maternal Aunt   . Breast cancer Paternal Aunt   . Breast cancer Maternal Aunt     Social History Social History   Tobacco Use  . Smoking status: Never Smoker  . Smokeless tobacco: Never Used  Substance Use Topics  . Alcohol use: No  . Drug use: No     Allergies   Erythromycin and Milk-related compounds   Review of Systems Review of Systems  Respiratory: Positive for shortness of breath.   All other systems reviewed and are negative except that which was mentioned in HPI    Physical Exam Updated Vital Signs BP (!) 114/55 (BP Location: Left Arm)   Pulse (!) 114   Temp 98.5 F (36.9 C) (Oral)   Resp (!) 22   Ht '5\' 4"'$  (1.626  m)   Wt 121.5 kg   LMP 08/31/2008   SpO2 94%   BMI 45.98 kg/m   Physical Exam Vitals signs and nursing note reviewed.  Constitutional:      General: She is not in acute distress.    Appearance: She is well-developed.  HENT:  Head: Normocephalic and atraumatic.     Mouth/Throat:     Mouth: Mucous membranes are moist.     Pharynx: Oropharynx is clear.  Eyes:     Conjunctiva/sclera: Conjunctivae normal.     Pupils: Pupils are equal, round, and reactive to light.  Neck:     Musculoskeletal: Neck supple.  Cardiovascular:     Rate and Rhythm: Regular rhythm. Tachycardia present.     Heart sounds: Normal heart sounds. No murmur.  Pulmonary:     Effort: Pulmonary effort is normal.     Comments: End expiratory wheezes bilaterally Abdominal:     General: Bowel sounds are normal. There is no distension.     Palpations: Abdomen is soft.     Tenderness: There is no abdominal tenderness.  Musculoskeletal:     Right lower leg: No edema.     Left lower leg: No edema.  Skin:    General: Skin is warm and dry.  Neurological:     Mental Status: She is alert and oriented to person, place, and time.     Comments: Fluent speech  Psychiatric:        Judgment: Judgment normal.      ED Treatments / Results  Labs (all labs ordered are listed, but only abnormal results are displayed) Labs Reviewed  COMPREHENSIVE METABOLIC PANEL - Abnormal; Notable for the following components:      Result Value   Potassium 3.2 (*)    Glucose, Bld 161 (*)    ALT 49 (*)    All other components within normal limits  CBC  BRAIN NATRIURETIC PEPTIDE  TROPONIN I    EKG EKG Interpretation  Date/Time:  Thursday March 15 2018 17:17:34 EST Ventricular Rate:  118 PR Interval:  148 QRS Duration: 90 QT Interval:  326 QTC Calculation: 456 R Axis:   -82 Text Interpretation:  Sinus tachycardia Left axis deviation Inferior infarct , age undetermined Anterolateral infarct , age undetermined Abnormal ECG  tachycardia new from previous, poor R wave progression through precordial leads Confirmed by Theotis Burrow [phone]) on 03/15/2018 8:55:33 PM   Radiology Dg Chest 2 View  Result Date: 03/15/2018 CLINICAL DATA:  Difficulty breathing EXAM: CHEST - 2 VIEW COMPARISON:  03/15/2018 FINDINGS: Cardiac shadows within normal limits. The overall inspiratory effort is poor similar to that seen on the prior exam. Left basilar atelectasis is again noted. No sizable effusion is seen. No bony abnormality is noted. IMPRESSION: Stable left basilar atelectasis similar to that seen on the prior exam. Electronically Signed   By: Inez Catalina M.D.   On: 03/15/2018 22:51    Procedures Procedures (including critical care time)  Medications Ordered in ED Medications  predniSONE (DELTASONE) tablet 40 mg (40 mg Oral Given 03/15/18 2302)  AEROCHAMBER PLUS FLO-VU LARGE MISC 1 each (1 each Other Given 03/16/18 0011)  propranolol (INDERAL) tablet 20 mg (20 mg Oral Given 03/15/18 2259)  ipratropium-albuterol (DUONEB) 0.5-2.5 (3) MG/3ML nebulizer solution 3 mL (3 mLs Nebulization Given 03/15/18 2259)     Initial Impression / Assessment and Plan / ED Course  I have reviewed the triage vital signs and the nursing notes.  Pertinent labs & imaging results that were available during my care of the patient were reviewed by me and considered in my medical decision making (see chart for details).    Non-toxic on exam, she was tachycardic but has not had propranolol this evening which she takes for fast heart rate.  She did have end expiratory wheezing bilaterally.  She improved after DuoNeb.  Also gave dose of steroids due to persistent wheezing on exam.  I reviewed chest x-ray from earlier today which shows a very poor inspiration and I suspect some of the vascular congestion is just due to airway collapse from expiratory film.  Repeat chest x-ray here, which is better study, shows mild L basilar atelectasis. I discussed that this  finding could represent poor inspiration or could also represent early pneumonia.  I provided with antibiotics and discussed that she can start immediately or take watchful waiting approach while she completes Tamiflu and continues prednisone.  She will also continue albuterol treatments at home.  I have extensively reviewed return precautions with her and she is voiced understanding.  She is well-appearing on repeat exam with normal work of breathing and feels comfortable with discharge.  All questions answered.  Final Clinical Impressions(s) / ED Diagnoses   Final diagnoses:  Influenza A  Bronchitis    ED Discharge Orders         Ordered    predniSONE (DELTASONE) 20 MG tablet  Daily     03/16/18 0017    doxycycline (VIBRAMYCIN) 100 MG capsule  2 times daily     03/16/18 0017           Mesiah Manzo, Wenda Overland, MD 03/16/18 [phone]

## 2018-03-15 NOTE — ED Notes (Signed)
Patient transported to X-ray 

## 2018-03-15 NOTE — ED Notes (Signed)
ED Provider at bedside. 

## 2018-03-16 DIAGNOSIS — Z794 Long term (current) use of insulin: Secondary | ICD-10-CM | POA: Diagnosis not present

## 2018-03-16 DIAGNOSIS — J209 Acute bronchitis, unspecified: Secondary | ICD-10-CM | POA: Diagnosis not present

## 2018-03-16 DIAGNOSIS — E119 Type 2 diabetes mellitus without complications: Secondary | ICD-10-CM | POA: Diagnosis not present

## 2018-03-16 DIAGNOSIS — I10 Essential (primary) hypertension: Secondary | ICD-10-CM | POA: Diagnosis not present

## 2018-03-16 DIAGNOSIS — J101 Influenza due to other identified influenza virus with other respiratory manifestations: Secondary | ICD-10-CM | POA: Diagnosis not present

## 2018-03-16 DIAGNOSIS — Z79899 Other long term (current) drug therapy: Secondary | ICD-10-CM | POA: Diagnosis not present

## 2018-03-16 MED ORDER — DOXYCYCLINE HYCLATE 100 MG PO CAPS: 100.0000 mg | ORAL_CAPSULE | Freq: Two times a day (BID) | ORAL | 0 refills | Status: AC

## 2018-03-16 MED ORDER — PREDNISONE 20 MG PO TABS: 40.0000 mg | ORAL_TABLET | Freq: Every day | ORAL | 0 refills | Status: DC

## 2018-03-16 MED ORDER — AEROCHAMBER PLUS FLO-VU LARGE MISC
Status: AC
  Administered 2018-03-16: 1
  Filled 2018-03-16: qty 1

## 2018-03-16 MED FILL — ACCU-CHEK GUIDE STRP: 90 days supply | Qty: 100 | Fill #1 | Status: TO

## 2018-03-16 MED FILL — HYDROCHLOROTHIAZIDE 25 MG T: 25 | 90 days supply | Qty: 90 | Fill #0

## 2018-03-16 MED FILL — ACCU-CHEK FASTCLIX LANCETS: 90 days supply | Qty: 102 | Fill #1

## 2018-03-16 MED FILL — DOXYCYCLINE HYC 100 MG CAPS: 100 | 7 days supply | Qty: 14 | Fill #0

## 2018-03-16 MED FILL — predniSONE 20 MG TABS: 20 | 5 days supply | Qty: 10 | Fill #0

## 2018-03-16 NOTE — Discharge Instructions (Addendum)
Take prednisone once daily until finished. Use albuterol 2 puffs with spacer every 4-6 hours for wheezing. Start antibiotics if persistent symptoms or if you feel symptoms are worsening. See primary care provider next week.

## 2018-03-27 DIAGNOSIS — J45909 Unspecified asthma, uncomplicated: Secondary | ICD-10-CM | POA: Diagnosis not present

## 2018-03-27 DIAGNOSIS — K219 Gastro-esophageal reflux disease without esophagitis: Secondary | ICD-10-CM | POA: Diagnosis not present

## 2018-03-28 MED FILL — ADVAIR 250/50 DISKUS: 250-50 | 30 days supply | Qty: 60 | Fill #0

## 2018-04-03 ENCOUNTER — Institutional Professional Consult (permissible substitution): Payer: 59 | Admitting: Pulmonary Disease

## 2018-04-04 ENCOUNTER — Institutional Professional Consult (permissible substitution): Payer: 59 | Admitting: Pulmonary Disease

## 2018-04-04 ENCOUNTER — Ambulatory Visit (INDEPENDENT_AMBULATORY_CARE_PROVIDER_SITE_OTHER): Payer: 59 | Admitting: Physician Assistant

## 2018-04-04 VITALS — BP 115/82 | HR 98 | Temp 98.4°F | Ht 64.0 in | Wt 259.0 lb

## 2018-04-04 DIAGNOSIS — E7849 Other hyperlipidemia: Secondary | ICD-10-CM | POA: Diagnosis not present

## 2018-04-04 DIAGNOSIS — Z9189 Other specified personal risk factors, not elsewhere classified: Secondary | ICD-10-CM

## 2018-04-04 DIAGNOSIS — E119 Type 2 diabetes mellitus without complications: Secondary | ICD-10-CM | POA: Diagnosis not present

## 2018-04-04 DIAGNOSIS — Z6841 Body Mass Index (BMI) 40.0 and over, adult: Secondary | ICD-10-CM | POA: Diagnosis not present

## 2018-04-04 MED ORDER — LIRAGLUTIDE 18 MG/3ML ~~LOC~~ SOPN: PEN_INJECTOR | SUBCUTANEOUS | 0 refills | Status: DC

## 2018-04-04 MED ORDER — INSULIN PEN NEEDLE 32G X 4 MM MISC: 1.0000 | Freq: Every day | 99 refills | Status: DC

## 2018-04-04 MED FILL — UNIFINE PENTIPS 32GX5/32: 32G X 4 MM | 90 days supply | Qty: 100 | Fill #0

## 2018-04-04 MED FILL — UNIFINE PENTIPS 32GX5/32": 32G X 4 MM | 90 days supply | Qty: 100 | Fill #0

## 2018-04-04 MED FILL — VICTOZA 18 MG/3 ML INJECT P: 18 | 30 days supply | Qty: 9 | Fill #0

## 2018-04-04 NOTE — Progress Notes (Signed)
Office: [phone]  /  Fax: [phone]   HPI:   Chief Complaint: OBESITY Veronica Russell is here to discuss her progress with her obesity treatment plan. She is on the Category 2 plan and is following her eating plan approximately 70% of the time. She states she is exercising 0 minutes 0 times per week. [name] did well with weight loss. She reports that she had the flu and was not able to eat for a few days. She is now back on track. Her weight is 259 lb (117.5 kg) today and has had a weight loss of 3 pounds over a period of 4 weeks since her last visit. She has lost 0 lbs since starting treatment with [name].  Diabetes II [name] has a diagnosis of diabetes type II. [name] states fasting blood sugars range between 111 and 160's. She states she was Prednisone for the flu and in that time her blood sugars ranged between 170 and 350. Last A1c was 7.2 on 03/07/2018. She has been working on intensive lifestyle modifications including diet, exercise, and weight loss to help control her blood glucose levels.  Hyperlipidemia [name] has hyperlipidemia and has been trying to improve her cholesterol levels with intensive lifestyle modification including a low saturated fat diet, exercise and weight loss. She is currently on atorvastatin and denies any chest pain.  At risk for cardiovascular disease [name] is at a higher than average risk for cardiovascular disease due to obesity. She currently denies any chest pain.  ASSESSMENT AND PLAN:  Type 2 diabetes mellitus without complication, without long-term current use of insulin ([name]) - Plan: liraglutide (VICTOZA) 18 MG/3ML SOPN, Insulin Pen Needle (BD PEN NEEDLE NANO U/F) 32G X 4 MM MISC  Other hyperlipidemia  At risk for heart disease  Class 3 severe obesity with serious comorbidity and body mass index (BMI) of 40.0 to 44.9 in adult, unspecified obesity type ([place])  PLAN:  Diabetes II [name] has been given extensive diabetes education by myself today including  ideal fasting and post-prandial blood glucose readings, individual ideal HgA1c goals  and hypoglycemia prevention. We discussed the importance of good blood sugar control to decrease the likelihood of diabetic complications such as nephropathy, neuropathy, limb loss, blindness, coronary artery disease, and death. We discussed the importance of intensive lifestyle modification including diet, exercise and weight loss as the first line treatment for diabetes. Vickiwas given a refill for Victoza #3 with 0 refills in addition to a refill on her needles #100 with 0 refills. [name] agrees to follow-up with our clinic in 3-4 weeks.  Hyperlipidemia [name] was informed of the American Heart Association Guidelines emphasizing intensive lifestyle modifications as the first line treatment for hyperlipidemia. We discussed many lifestyle modifications today in depth, and [name] will continue to work on decreasing saturated fats such as fatty red meat, butter and many fried foods. She will also increase vegetables and lean protein in her diet and continue to work on exercise and weight loss efforts.  Cardiovascular risk counseling [name] was given extended (15 minutes) coronary artery disease prevention counseling today. She is 58 y.o. female and has risk factors for heart disease including obesity. We discussed intensive lifestyle modifications today with an emphasis on specific weight loss instructions and strategies. Pt will continue on atorvastatin and was also informed of the importance of increasing exercise and decreasing saturated fats to help prevent heart disease.  Obesity [name] is currently in the action stage of change. As such, her goal is to continue with weight loss  efforts. She has agreed to follow the Category 2 plan. [name] has been instructed to work up to a goal of 150 minutes of combined cardio and strengthening exercise per week for weight loss and overall health benefits. We discussed the following  Behavioral Modification Strategies today: increasing lean protein intake and work on meal planning and easy cooking plans.  [name] has agreed to follow-up with our clinic in 3-4 weeks. She was informed of the importance of frequent follow up visits to maximize her success with intensive lifestyle modifications for her multiple health conditions.  ALLERGIES: Allergies  Allergen Reactions  . Erythromycin Nausea Only  . Milk-Related Compounds Other (See Comments)    Lactose intollerance    MEDICATIONS: Current Outpatient Medications on File Prior to Visit  Medication Sig Dispense Refill  . albuterol (VENTOLIN HFA) 108 (90 Base) MCG/ACT inhaler Inhale 2 puffs into the lungs every 6 (six) hours as needed for wheezing or shortness of breath. 1 Inhaler 0  . Alcohol Swabs (ALCOHOL WIPES) 70 % PADS 30 Packages by Does not apply route 2 (two) times daily. 100 each 0  . amLODipine (NORVASC) 5 MG tablet Take 5 mg by mouth daily.    Marland Kitchen atorvastatin (LIPITOR) 20 MG tablet Take 20 mg by mouth daily.    . Cholecalciferol 4000 units CAPS Take 4,000 Units by mouth daily.     Marland Kitchen glipiZIDE (GLUCOTROL) 5 MG tablet Take 1 tablet (5 mg total) by mouth daily before supper. 90 tablet 3  . glucose blood (ACCU-CHEK GUIDE) test strip Use twice daily 100 each prn  . hydrochlorothiazide (HYDRODIURIL) 25 MG tablet Take 25 mg by mouth every morning.  1  . Lancets (ACCU-CHEK SAFE-T PRO) lancets Use as instructed 60 each 0  . losartan (COZAAR) 50 MG tablet Take 50 mg by mouth daily.     . Melatonin 3 MG TABS Take 1 tablet by mouth at bedtime as needed.    . metFORMIN (GLUCOPHAGE-XR) 500 MG 24 hr tablet Take 2 tablets (1,000 mg total) by mouth at bedtime. 180 tablet 3  . montelukast (SINGULAIR) 10 MG tablet Take 10 mg by mouth at bedtime.    . propranolol (INDERAL) 20 MG tablet Take 20 mg by mouth daily.     . benzonatate (TESSALON) 100 MG capsule Take 1-2 capsules (100-200 mg total) by mouth 3 (three) times daily as needed  for cough. (Patient not taking: Reported on 04/04/2018) 40 capsule 0  . oseltamivir (TAMIFLU) 75 MG capsule Take 1 capsule (75 mg total) by mouth 2 (two) times daily. (Patient not taking: Reported on 04/04/2018) 10 capsule 0  . predniSONE (DELTASONE) 20 MG tablet Take 2 tablets (40 mg total) by mouth daily. (Patient not taking: Reported on 04/04/2018) 10 tablet 0   No current facility-administered medications on file prior to visit.     PAST MEDICAL HISTORY: Past Medical History:  Diagnosis Date  . Alopecia   . Asthma   . Back pain   . Diabetes mellitus ([place])   . GERD (gastroesophageal reflux disease)   . Hyperlipidemia   . Hypertension   . Joint pain   . Lactose intolerance   . Obesity   . Raynauds syndrome   . Swelling   . Tremor   . Vitamin D deficiency     PAST SURGICAL HISTORY: Past Surgical History:  Procedure Laterality Date  . ABDOMINAL HYSTERECTOMY    . BREAST BIOPSY    . MYOMECTOMY      SOCIAL HISTORY: Social History  Tobacco Use  . Smoking status: Never Smoker  . Smokeless tobacco: Never Used  Substance Use Topics  . Alcohol use: No  . Drug use: No    FAMILY HISTORY: Family History  Problem Relation Age of Onset  . Multiple myeloma Mother   . Hypertension Mother   . Hyperlipidemia Mother   . Cancer Mother   . Obesity Mother   . Hypertension Father   . Hyperlipidemia Father   . Cancer Father   . Asthma Other   . Hyperlipidemia Other   . Hypertension Other   . Cancer Other   . COPD Other   . Stroke Other   . Breast cancer Maternal Aunt   . Breast cancer Paternal Aunt   . Breast cancer Maternal Aunt    ROS: Review of Systems  Constitutional: Positive for weight loss.  Cardiovascular: Negative for chest pain.   PHYSICAL EXAM: Blood pressure 115/82, pulse 98, temperature 98.4 F (36.9 C), temperature source Oral, height _0  (1.626 m), weight 259 lb (117.5 kg), last menstrual period 08/31/2008, SpO2 96 %. Body mass index is 44.46  kg/m. Physical Exam Vitals signs reviewed.  Constitutional:      Appearance: Normal appearance. She is obese.  Cardiovascular:     Rate and Rhythm: Normal rate.     Pulses: Normal pulses.  Pulmonary:     Effort: Pulmonary effort is normal.     Breath sounds: Normal breath sounds.  Musculoskeletal: Normal range of motion.  Skin:    General: Skin is warm and dry.  Neurological:     Mental Status: She is alert and oriented to person, place, and time.  Psychiatric:        Behavior: Behavior normal.   RECENT LABS AND TESTS: BMET    Component Value Date/Time   NA 140 03/15/2018 1739   NA 143 03/07/2018 0909   K 3.2 (L) 03/15/2018 1739   CL 98 03/15/2018 1739   CO2 32 03/15/2018 1739   GLUCOSE 161 (H) 03/15/2018 1739   BUN 10 03/15/2018 1739   BUN 13 03/07/2018 0909   CREATININE 0.80 03/15/2018 1739   CALCIUM 9.7 03/15/2018 1739   GFRNONAA >60 03/15/2018 1739   GFRAA >60 03/15/2018 1739   Lab Results  Component Value Date   HGBA1C 7.2 (H) 03/07/2018   HGBA1C 7.0 (A) 01/10/2018   HGBA1C 7.4 (H) 10/26/2017   HGBA1C 7.1 (A) 10/05/2017   HGBA1C 7.8 05/10/2017   Lab Results  Component Value Date   INSULIN 43.1 (H) 03/07/2018   INSULIN 56.5 (H) 10/26/2017   INSULIN 39.0 (H) 09/29/2016   INSULIN 38.5 (H) 05/09/2016   INSULIN 27.3 (H) 01/27/2016   CBC    Component Value Date/Time   WBC 6.4 03/15/2018 1739   RBC 4.99 03/15/2018 1739   HGB 14.1 03/15/2018 1739   HGB 12.1 01/27/2016 1006   HCT 44.4 03/15/2018 1739   HCT 37.0 01/27/2016 1006   PLT 216 03/15/2018 1739   MCV 89.0 03/15/2018 1739   MCV 86 01/27/2016 1006   MCH 28.3 03/15/2018 1739   MCHC 31.8 03/15/2018 1739   RDW 13.4 03/15/2018 1739   RDW 13.3 01/27/2016 1006   LYMPHSABS 2.0 01/27/2016 1006   EOSABS 0.2 01/27/2016 1006   BASOSABS 0.0 01/27/2016 1006   Iron/TIBC/Ferritin/ %Sat No results found for: IRON, TIBC, FERRITIN, IRONPCTSAT Lipid Panel     Component Value Date/Time   CHOL 165 03/07/2018  0909   TRIG 138 03/07/2018 0909   HDL 54  03/07/2018 0909   LDLCALC 83 03/07/2018 0909   Hepatic Function Panel     Component Value Date/Time   PROT 7.3 03/15/2018 1739   PROT 6.9 03/07/2018 0909   ALBUMIN 3.7 03/15/2018 1739   ALBUMIN 4.1 03/07/2018 0909   AST 36 03/15/2018 1739   ALT 49 (H) 03/15/2018 1739   ALKPHOS 67 03/15/2018 1739   BILITOT 0.9 03/15/2018 1739   BILITOT 0.3 03/07/2018 0909      Component Value Date/Time   TSH 0.50 10/05/2017 0956   TSH 0.52 04/04/2017 0859   TSH 0.436 (L) 09/29/2016 0937    Ref. Range 03/07/2018 09:09  Vitamin D, 25-Hydroxy Latest Ref Range: 30.0 - 100.0 ng/mL 44.9   OBESITY BEHAVIORAL INTERVENTION VISIT  Today's visit was #41  Starting weight: 259 lbs Starting date: 01/27/2016 Today's weight: 259 lbs  Today's date: 04/04/2018 Total lbs lost to date: 0    04/04/2018  Height _0  (1.626 m)  Weight 259 lb (117.5 kg)  BMI (Calculated) 44.44  BLOOD PRESSURE - SYSTOLIC 749  BLOOD PRESSURE - DIASTOLIC 82   Body Fat % 44.9 %  Total Body Water (lbs) 87.8 lbs   ASK: We discussed the diagnosis of obesity with [name] today and [name] agreed to give [name] permission to discuss obesity behavioral modification therapy today.  ASSESS: Melvin has the diagnosis of obesity and her BMI today is 44.44. Kameshia is in the action stage of change.   ADVISE: Aleni was educated on the multiple health risks of obesity as well as the benefit of weight loss to improve her health. She was advised of the need for long term treatment and the importance of lifestyle modifications to improve her current health and to decrease her risk of future health problems.  AGREE: Multiple dietary modification options and treatment options were discussed and  Xaria agreed to follow the recommendations documented in the above note.  ARRANGE: Kelsey was educated on the importance of frequent visits to treat obesity as outlined per CMS and USPSTF guidelines and agreed  to schedule her next follow up appointment today.  Migdalia Dk, am acting as transcriptionist for Abby Potash, PA-C I, Abby Potash, PA-C have reviewed above note and agree with its content

## 2018-04-12 ENCOUNTER — Ambulatory Visit: Payer: 59 | Admitting: Pulmonary Disease

## 2018-04-12 ENCOUNTER — Encounter: Payer: Self-pay | Admitting: Pulmonary Disease

## 2018-04-12 ENCOUNTER — Other Ambulatory Visit: Payer: Self-pay

## 2018-04-12 VITALS — BP 128/88 | HR 76 | Ht 64.0 in | Wt 265.0 lb

## 2018-04-12 DIAGNOSIS — R062 Wheezing: Secondary | ICD-10-CM | POA: Diagnosis not present

## 2018-04-12 DIAGNOSIS — R0602 Shortness of breath: Secondary | ICD-10-CM | POA: Diagnosis not present

## 2018-04-12 NOTE — Progress Notes (Signed)
Subjective:   PATIENT ID: Veronica Russell GENDER: female DOB: October 17, 1960, MRN: [id number]   HPI  Chief Complaint  Patient presents with  . Consult    several times a year gets resp cold, etc and takes long time to recover.  Due to repeated problems with SOB/wheezing she was referred here.    Reason for Visit: New consult for recurrent respiratory illness  Veronica Russell is a 58 year old female never smoker who presents for recent viral pneumonia.  On 03/12/18 she was clinically diagnosed with influenza and treated with prednisone and albuterol. She had continued to have intermittent wheezing. Otherwise she is currently asymptomatic unless triggered by illness or odors.  For the last 2-3 years, she has a yearly respiratory illness that requires multiple doctor visits. During those sick events, she has chest tightness and wheezing. Associated with mild shortness of breath. Denies coughing.  Triggered by perfumes, flowers and smoke. She will sometimes have to leave church service is she is near people with strong perfumes. Symptoms improve after avoiding exposures. Her albuterol does not seem to help her symptoms.   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  Environmental exposures: None  I have personally reviewed patient's past medical/family/social history, allergies, current medications.  Past Medical History:  Diagnosis Date  . Alopecia   . Asthma   . Back pain   . Diabetes ([place])   . Diabetes mellitus ([place])   . GERD (gastroesophageal reflux disease)   . Hyperlipidemia   . Hypertension   . Joint pain   . Lactose intolerance   . Obesity   . Raynauds syndrome   . Swelling   . Tremor   . Vitamin D deficiency      Family History  Problem Relation Age of Onset  . Multiple myeloma Mother   . Hypertension Mother   . Hyperlipidemia Mother   . Cancer Mother   . Obesity Mother   . Hypertension Father   . Hyperlipidemia Father   . Cancer  Father   . Asthma Other   . Hyperlipidemia Other   . Hypertension Other   . Cancer Other   . COPD Other   . Stroke Other   . Breast cancer Maternal Aunt   . Breast cancer Paternal Aunt   . Breast cancer Maternal Aunt      Social History   Occupational History  . Occupation: [employer]    Employer: [employer]  Tobacco Use  . Smoking status: Never Smoker  . Smokeless tobacco: Never Used  Substance and Sexual Activity  . Alcohol use: No  . Drug use: No  . Sexual activity: Not on file    Allergies  Allergen Reactions  . Erythromycin Nausea Only  . Milk-Related Compounds Other (See Comments)    Lactose intollerance     Outpatient Medications Prior to Visit  Medication Sig Dispense Refill  . albuterol (VENTOLIN HFA) 108 (90 Base) MCG/ACT inhaler Inhale 2 puffs into the lungs every 6 (six) hours as needed for wheezing or shortness of breath. 1 Inhaler 0  . Alcohol Swabs (ALCOHOL WIPES) 70 % PADS 30 Packages by Does not apply route 2 (two) times daily. 100 each 0  . amLODipine (NORVASC) 5 MG tablet Take 5 mg by mouth daily.    Marland Kitchen atorvastatin (LIPITOR) 20 MG tablet Take 20 mg by mouth daily.    . benzonatate (TESSALON) 100 MG capsule Take 1-2 capsules (100-200 mg total)  by mouth 3 (three) times daily as needed for cough. (Patient not taking: Reported on 04/04/2018) 40 capsule 0  . Cholecalciferol 4000 units CAPS Take 4,000 Units by mouth daily.     Marland Kitchen glipiZIDE (GLUCOTROL) 5 MG tablet Take 1 tablet (5 mg total) by mouth daily before supper. 90 tablet 3  . glucose blood (ACCU-CHEK GUIDE) test strip Use twice daily 100 each prn  . hydrochlorothiazide (HYDRODIURIL) 25 MG tablet Take 25 mg by mouth every morning.  1  . Insulin Pen Needle (BD PEN NEEDLE NANO U/F) 32G X 4 MM MISC 1 Package by Does not apply route daily. 100 each prn  . Lancets (ACCU-CHEK SAFE-T PRO) lancets Use as instructed 60 each 0  . liraglutide (VICTOZA) 18 MG/3ML SOPN INJECT 1.8 MGS INTO THE SKIN EVERY  MORNING. 9 mL 0  . losartan (COZAAR) 50 MG tablet Take 50 mg by mouth daily.     . Melatonin 3 MG TABS Take 1 tablet by mouth at bedtime as needed.    . metFORMIN (GLUCOPHAGE-XR) 500 MG 24 hr tablet Take 2 tablets (1,000 mg total) by mouth at bedtime. 180 tablet 3  . montelukast (SINGULAIR) 10 MG tablet Take 10 mg by mouth at bedtime.    Marland Kitchen oseltamivir (TAMIFLU) 75 MG capsule Take 1 capsule (75 mg total) by mouth 2 (two) times daily. (Patient not taking: Reported on 04/04/2018) 10 capsule 0  . predniSONE (DELTASONE) 20 MG tablet Take 2 tablets (40 mg total) by mouth daily. (Patient not taking: Reported on 04/04/2018) 10 tablet 0  . propranolol (INDERAL) 20 MG tablet Take 20 mg by mouth daily.      No facility-administered medications prior to visit.     Review of Systems  Constitutional: Negative for chills, diaphoresis, fever, malaise/fatigue and weight loss.  HENT: Negative for congestion, ear pain and sore throat.   Respiratory: Positive for shortness of breath and wheezing. Negative for cough, hemoptysis and sputum production.   Cardiovascular: Negative for chest pain, palpitations and leg swelling.  Gastrointestinal: Negative for abdominal pain, heartburn and nausea.  Genitourinary: Negative for frequency.  Musculoskeletal: Negative for joint pain and myalgias.  Skin: Negative for itching and rash.  Neurological: Negative for dizziness, weakness and headaches.  Endo/Heme/Allergies: Does not bruise/bleed easily.  Psychiatric/Behavioral: Negative for depression. The patient is not nervous/anxious.      Objective:   Vitals:   04/12/18 1536  BP: 128/88  Pulse: 76  SpO2: 92%  Weight: 265 lb (120.2 kg)  Height: _0  (1.626 m)   SpO2: 92 %  Physical Exam: General: Well-appearing, no acute distress HENT: [place], AT, OP clear, MMM Eyes: EOMI, no scleral icterus Respiratory: Clear to auscultation bilaterally.  No crackles, wheezing or rales Cardiovascular: RRR, -M/R/G, no JVD GI: BS+,  soft, nontender Extremities:-Edema,-tenderness Neuro: AAO x4, CNII-XII grossly intact Skin: Intact, no rashes or bruising Psych: Normal mood, normal affect  Data Reviewed:  Imaging: CXR 03/15/18 - left basilar infiltrate  PFT: None on file  Labs: CMP on 2/5 reviewed. Hyperglycemia otherwise labs appropriate  Imaging, labs and tests noted above have been reviewed independently by me.    Assessment & Plan:   Discussion: 58 year old female with shortness of breath and wheezing. Her recent symptoms are likely related to viral pneumonia however unable to rule our underlying asthma. Will obtain PFTs for further evaluation. Pending study, may consider discontinuing Advair if normal.  Shortness of breath, wheezing Will obtain pulmonary function tests to rule out asthma Hold  your Advair and Albuterol 48 hours prior to testing Will arrange follow-up with NP after test  Orders Placed This Encounter  Procedures  . Pulmonary Function Test    Standing Status:   Future    Number of Occurrences:   1    Standing Expiration Date:   04/12/2019    Scheduling Instructions:     As soon as possible    Order Specific Question:   Where should this test be performed?    Answer:   [facility]    Order Specific Question:   Full PFT: includes the following: basic spirometry, spirometry pre & post bronchodilator, diffusion capacity (DLCO), lung volumes    Answer:   Full PFT    Order Specific Question:   MIP/MEP    Answer:   No    Order Specific Question:   6 minute walk    Answer:   No    Order Specific Question:   ABG    Answer:   No    Order Specific Question:   Diffusion capacity (DLCO)    Answer:   Yes    Order Specific Question:   Lung volumes    Answer:   Yes    Order Specific Question:   Methacholine challenge    Answer:   No  No orders of the defined types were placed in this encounter.   Return in about 1 week (around 04/19/2018).  [place], MD [facility] 04/24/2018 2:34 PM  Office Number [phone]

## 2018-04-12 NOTE — Patient Instructions (Signed)
Shortness of breath, wheezing Will obtain pulmonary function tests to rule out asthma Hold your Advair and Albuterol 48 hours prior to testing Will arrange follow-up with NP after test

## 2018-04-17 ENCOUNTER — Other Ambulatory Visit: Payer: Self-pay

## 2018-04-17 ENCOUNTER — Ambulatory Visit (INDEPENDENT_AMBULATORY_CARE_PROVIDER_SITE_OTHER): Payer: 59 | Admitting: Pulmonary Disease

## 2018-04-17 DIAGNOSIS — R0602 Shortness of breath: Secondary | ICD-10-CM

## 2018-04-17 LAB — PULMONARY FUNCTION TEST
DL/VA % pred: 123 %
DL/VA: 5.2 ml/min/mmHg/L
DLCO COR: 23.4 ml/min/mmHg
DLCO UNC: 23.88 ml/min/mmHg
DLCO cor % pred: 112 %
DLCO unc % pred: 115 %
FEF 25-75 Post: 3.51 L/sec
FEF 25-75 Pre: 2.33 L/sec
FEF2575-%Change-Post: 50 %
FEF2575-%Pred-Post: 160 %
FEF2575-%Pred-Pre: 106 %
FEV1-%Change-Post: 10 %
FEV1-%Pred-Post: 106 %
FEV1-%Pred-Pre: 96 %
FEV1-Post: 2.34 L
FEV1-Pre: 2.11 L
FEV1FVC-%Change-Post: 5 %
FEV1FVC-%Pred-Pre: 103 %
FEV6-%CHANGE-POST: 5 %
FEV6-%Pred-Post: 100 %
FEV6-%Pred-Pre: 95 %
FEV6-Post: 2.69 L
FEV6-Pre: 2.56 L
FEV6FVC-%PRED-POST: 103 %
FEV6FVC-%Pred-Pre: 103 %
FVC-%Change-Post: 5 %
FVC-%Pred-Post: 97 %
FVC-%Pred-Pre: 92 %
FVC-Post: 2.69 L
FVC-Pre: 2.56 L
Post FEV1/FVC ratio: 87 %
Post FEV6/FVC ratio: 100 %
Pre FEV1/FVC ratio: 82 %
Pre FEV6/FVC Ratio: 100 %
RV % pred: 83 %
RV: 1.65 L
TLC % pred: 91 %
TLC: 4.7 L

## 2018-04-17 NOTE — Progress Notes (Signed)
PFT done today. 

## 2018-04-23 NOTE — Telephone Encounter (Signed)
Patient requesting PFT results.  PFT was 04/17/18.  Message routed to Dr Loanne Drilling

## 2018-04-26 ENCOUNTER — Encounter (INDEPENDENT_AMBULATORY_CARE_PROVIDER_SITE_OTHER): Payer: Self-pay

## 2018-04-28 MED FILL — ATORVASTATIN 20 MG TABLET: 20 | 90 days supply | Qty: 90 | Fill #0

## 2018-04-28 MED FILL — glipiZIDE 5 MG TABS: 5 | 90 days supply | Qty: 90 | Fill #0

## 2018-04-28 MED FILL — metFORMIN HCL ER 500 MG TB2: 500 | 90 days supply | Qty: 180 | Fill #0

## 2018-05-03 ENCOUNTER — Other Ambulatory Visit (INDEPENDENT_AMBULATORY_CARE_PROVIDER_SITE_OTHER): Payer: Self-pay | Admitting: Physician Assistant

## 2018-05-03 ENCOUNTER — Encounter (INDEPENDENT_AMBULATORY_CARE_PROVIDER_SITE_OTHER): Payer: Self-pay | Admitting: Physician Assistant

## 2018-05-03 ENCOUNTER — Ambulatory Visit (INDEPENDENT_AMBULATORY_CARE_PROVIDER_SITE_OTHER): Payer: 59 | Admitting: Physician Assistant

## 2018-05-03 ENCOUNTER — Other Ambulatory Visit: Payer: Self-pay

## 2018-05-03 DIAGNOSIS — E119 Type 2 diabetes mellitus without complications: Secondary | ICD-10-CM | POA: Diagnosis not present

## 2018-05-03 DIAGNOSIS — Z6841 Body Mass Index (BMI) 40.0 and over, adult: Secondary | ICD-10-CM

## 2018-05-03 DIAGNOSIS — I10 Essential (primary) hypertension: Secondary | ICD-10-CM

## 2018-05-04 MED FILL — ACCU-CHEK GUIDE TEST STRIP: 50 days supply | Qty: 100 | Fill #0

## 2018-05-07 MED ORDER — LIRAGLUTIDE 18 MG/3ML ~~LOC~~ SOPN: PEN_INJECTOR | SUBCUTANEOUS | 0 refills | Status: DC

## 2018-05-07 NOTE — Progress Notes (Signed)
Office: [phone]  /  Fax: [phone]  TeleHealth Visit:  [name] has verbally consented to this TeleHealth visit today. The patient is located at work, the provider is located at the [employer] [facility] office. The participants in this visit include the listed provider and patient. The visit was conducted today via FaceTime.  HPI:   Chief Complaint: OBESITY Veronica Russell is here to discuss her progress with her obesity treatment plan. She is on the Category 2 plan and is following her eating plan approximately 70% of the time. She states she is exercising 0 minutes 0 times per week. Veronica Russell reports that she sometimes is not eating lunch due to her work schedule. She states she is going to be working from home soon. We were unable to weigh the patient today for this TeleHealth visit. She feels as if she has maintained weight since her last visit. She has lost 0 lbs since starting treatment with [name].  Diabetes II Veronica Russell has a diagnosis of diabetes type II. Veronica Russell states fasting blood sugars are in the range of 116 and 140's. Last A1c was reported to be 7.2 on 03/07/2018.  She is currently taking metformin, Victoza, and Glipizide and has been working on intensive lifestyle modifications including diet, exercise, and weight loss to help control her blood glucose levels.  Hypertension Veronica Russell is a 58 y.o. female with hypertension. Veronica Russell denies chest pain or headache. She is working weight loss to help control her blood pressure with the goal of decreasing her risk of heart attack and stroke. Veronica Russell's blood pressure at home is averaging 130-140's. She is currently taking amlodipine, HCTZ, and Inderal.  ASSESSMENT AND PLAN:  Type 2 diabetes mellitus without complication, without long-term current use of insulin ([place]) - Plan: liraglutide (VICTOZA) 18 MG/3ML SOPN  Essential hypertension  Class 3 severe obesity with serious comorbidity and body mass index (BMI) of 45.0  to 49.9 in adult, unspecified obesity type ([place])  PLAN:  Diabetes II Veronica Russell has been given extensive diabetes education by myself today including ideal fasting and post-prandial blood glucose readings, individual ideal HgA1c goals  and hypoglycemia prevention. We discussed the importance of good blood sugar control to decrease the likelihood of diabetic complications such as nephropathy, neuropathy, limb loss, blindness, coronary artery disease, and death. We discussed the importance of intensive lifestyle modification including diet, exercise and weight loss as the first line treatment for diabetes. [name] was given a refill on her Victoza 1.8 mg #3 with 0 refills and agrees to follow-up with our clinic in 3-4 weeks.   Hypertension We discussed sodium restriction, working on healthy weight loss, and a regular exercise program as the means to achieve improved blood pressure control. Veronica Russell agreed with this plan and agreed to follow up as directed. We will continue to monitor her blood pressure as well as her progress with the above lifestyle modifications. She will continue her medications as prescribed and will watch for signs of hypotension as she continues her lifestyle modifications.  Obesity Veronica Russell is currently in the action stage of change. As such, her goal is to continue with weight loss efforts. She has agreed to follow the Category 2 plan. Veronica Russell has been instructed to work up to a goal of 150 minutes of combined cardio and strengthening exercise per week for weight loss and overall health benefits. We discussed the following Behavioral Modification Strategies today: work on meal planning, easy cooking plans, and keeping healthy foods in the home.  Veronica Russell has agreed to follow-up with our clinic in 3-4 weeks. She was informed of the importance of frequent follow-up visits to maximize her success with intensive lifestyle modifications for her multiple health conditions.  ALLERGIES: Allergies    Allergen Reactions   Erythromycin Nausea Only   Milk-Related Compounds Other (See Comments)    Lactose intollerance    MEDICATIONS: Current Outpatient Medications on File Prior to Visit  Medication Sig Dispense Refill   albuterol (VENTOLIN HFA) 108 (90 Base) MCG/ACT inhaler Inhale 2 puffs into the lungs every 6 (six) hours as needed for wheezing or shortness of breath. 1 Inhaler 0   Alcohol Swabs (ALCOHOL WIPES) 70 % PADS 30 Packages by Does not apply route 2 (two) times daily. 100 each 0   amLODipine (NORVASC) 5 MG tablet Take 5 mg by mouth daily.     atorvastatin (LIPITOR) 20 MG tablet Take 20 mg by mouth daily.     benzonatate (TESSALON) 100 MG capsule Take 1-2 capsules (100-200 mg total) by mouth 3 (three) times daily as needed for cough. (Patient not taking: Reported on 04/04/2018) 40 capsule 0   Cholecalciferol 4000 units CAPS Take 4,000 Units by mouth daily.      glipiZIDE (GLUCOTROL) 5 MG tablet Take 1 tablet (5 mg total) by mouth daily before supper. 90 tablet 3   glucose blood (ACCU-CHEK GUIDE) test strip Use twice daily 100 each prn   hydrochlorothiazide (HYDRODIURIL) 25 MG tablet Take 25 mg by mouth every morning.  1   Insulin Pen Needle (BD PEN NEEDLE NANO U/F) 32G X 4 MM MISC 1 Package by Does not apply route daily. 100 each prn   Lancets (ACCU-CHEK SAFE-T PRO) lancets Use as instructed 60 each 0   losartan (COZAAR) 50 MG tablet Take 50 mg by mouth daily.      Melatonin 3 MG TABS Take 1 tablet by mouth at bedtime as needed.     metFORMIN (GLUCOPHAGE-XR) 500 MG 24 hr tablet Take 2 tablets (1,000 mg total) by mouth at bedtime. 180 tablet 3   montelukast (SINGULAIR) 10 MG tablet Take 10 mg by mouth at bedtime.     oseltamivir (TAMIFLU) 75 MG capsule Take 1 capsule (75 mg total) by mouth 2 (two) times daily. (Patient not taking: Reported on 04/04/2018) 10 capsule 0   predniSONE (DELTASONE) 20 MG tablet Take 2 tablets (40 mg total) by mouth daily. (Patient not  taking: Reported on 04/04/2018) 10 tablet 0   propranolol (INDERAL) 20 MG tablet Take 20 mg by mouth daily.      No current facility-administered medications on file prior to visit.     PAST MEDICAL HISTORY: Past Medical History:  Diagnosis Date   Alopecia    Asthma    Back pain    Diabetes ([place])    Diabetes mellitus ([facility])    GERD (gastroesophageal reflux disease)    Hyperlipidemia    Hypertension    Joint pain    Lactose intolerance    Obesity    Raynauds syndrome    Swelling    Tremor    Vitamin D deficiency     PAST SURGICAL HISTORY: Past Surgical History:  Procedure Laterality Date   ABDOMINAL HYSTERECTOMY     BREAST BIOPSY     MYOMECTOMY      SOCIAL HISTORY: Social History   Tobacco Use   Smoking status: Never Smoker   Smokeless tobacco: Never Used  Substance Use Topics   Alcohol use: No   Drug use:  No    FAMILY HISTORY: Family History  Problem Relation Age of Onset   Multiple myeloma Mother    Hypertension Mother    Hyperlipidemia Mother    Cancer Mother    Obesity Mother    Hypertension Father    Hyperlipidemia Father    Cancer Father    Asthma Other    Hyperlipidemia Other    Hypertension Other    Cancer Other    COPD Other    Stroke Other    Breast cancer Maternal Aunt    Breast cancer Paternal Aunt    Breast cancer Maternal Aunt    ROS: Review of Systems  Cardiovascular: Negative for chest pain.  Neurological: Negative for headaches.   PHYSICAL EXAM: Pt in no acute distress  RECENT LABS AND TESTS: BMET    Component Value Date/Time   NA 140 03/15/2018 1739   NA 143 03/07/2018 0909   K 3.2 (L) 03/15/2018 1739   CL 98 03/15/2018 1739   CO2 32 03/15/2018 1739   GLUCOSE 161 (H) 03/15/2018 1739   BUN 10 03/15/2018 1739   BUN 13 03/07/2018 0909   CREATININE 0.80 03/15/2018 1739   CALCIUM 9.7 03/15/2018 1739   GFRNONAA >60 03/15/2018 1739   GFRAA >60 03/15/2018 1739   Lab Results    Component Value Date   HGBA1C 7.2 (H) 03/07/2018   HGBA1C 7.0 (A) 01/10/2018   HGBA1C 7.4 (H) 10/26/2017   HGBA1C 7.1 (A) 10/05/2017   HGBA1C 7.8 05/10/2017   Lab Results  Component Value Date   INSULIN 43.1 (H) 03/07/2018   INSULIN 56.5 (H) 10/26/2017   INSULIN 39.0 (H) 09/29/2016   INSULIN 38.5 (H) 05/09/2016   INSULIN 27.3 (H) 01/27/2016   CBC    Component Value Date/Time   WBC 6.4 03/15/2018 1739   RBC 4.99 03/15/2018 1739   HGB 14.1 03/15/2018 1739   HGB 12.1 01/27/2016 1006   HCT 44.4 03/15/2018 1739   HCT 37.0 01/27/2016 1006   PLT 216 03/15/2018 1739   MCV 89.0 03/15/2018 1739   MCV 86 01/27/2016 1006   MCH 28.3 03/15/2018 1739   MCHC 31.8 03/15/2018 1739   RDW 13.4 03/15/2018 1739   RDW 13.3 01/27/2016 1006   LYMPHSABS 2.0 01/27/2016 1006   EOSABS 0.2 01/27/2016 1006   BASOSABS 0.0 01/27/2016 1006   Iron/TIBC/Ferritin/ %Sat No results found for: IRON, TIBC, FERRITIN, IRONPCTSAT Lipid Panel     Component Value Date/Time   CHOL 165 03/07/2018 0909   TRIG 138 03/07/2018 0909   HDL 54 03/07/2018 0909   LDLCALC 83 03/07/2018 0909   Hepatic Function Panel     Component Value Date/Time   PROT 7.3 03/15/2018 1739   PROT 6.9 03/07/2018 0909   ALBUMIN 3.7 03/15/2018 1739   ALBUMIN 4.1 03/07/2018 0909   AST 36 03/15/2018 1739   ALT 49 (H) 03/15/2018 1739   ALKPHOS 67 03/15/2018 1739   BILITOT 0.9 03/15/2018 1739   BILITOT 0.3 03/07/2018 0909      Component Value Date/Time   TSH 0.50 10/05/2017 0956   TSH 0.52 04/04/2017 0859   TSH 0.436 (L) 09/29/2016 0937   Results for SHERELLE, CASTELLI (MRN [id number]) as of 05/07/2018 09:33  Ref. Range 03/07/2018 09:09  Vitamin D, 25-Hydroxy Latest Ref Range: 30.0 - 100.0 ng/mL 44.9    I, Michaelene Song, am acting as Location manager for [employer], PA-C I, Abby Potash, PA-C have reviewed above note and agree with its content

## 2018-05-08 MED FILL — VICTOZA 18 MG/3 ML INJECT P: 18 | 30 days supply | Qty: 9 | Fill #0

## 2018-05-15 ENCOUNTER — Other Ambulatory Visit: Payer: Self-pay

## 2018-05-15 ENCOUNTER — Ambulatory Visit (INDEPENDENT_AMBULATORY_CARE_PROVIDER_SITE_OTHER): Payer: 59 | Admitting: Internal Medicine

## 2018-05-15 ENCOUNTER — Encounter: Payer: Self-pay | Admitting: Internal Medicine

## 2018-05-15 DIAGNOSIS — E042 Nontoxic multinodular goiter: Secondary | ICD-10-CM | POA: Diagnosis not present

## 2018-05-15 DIAGNOSIS — E119 Type 2 diabetes mellitus without complications: Secondary | ICD-10-CM

## 2018-05-15 DIAGNOSIS — E0590 Thyrotoxicosis, unspecified without thyrotoxic crisis or storm: Secondary | ICD-10-CM | POA: Diagnosis not present

## 2018-05-15 NOTE — Progress Notes (Signed)
Spoke with patient ahead of scheduled virtual visit with Dr. Cruzita Lederer.  Patient correctly identified.  Verified patient's medications, drug allergies and pharmacy.  Doxy invite sent, verified patient uses [employer].

## 2018-05-15 NOTE — Progress Notes (Signed)
Patient ID: Veronica Russell, female   DOB: March 13, 1960, 57 y.o.   MRN: [id number]   Patient location: Work My location: Office  Referring Provider: Aretta Nip, MD  I connected with the patient on 05/15/18 at  9:20 AM EDT by a video enabled telemedicine application and verified that I am speaking with the correct person.   I discussed the limitations of evaluation and management by telemedicine and the availability of in person appointments. The patient expressed understanding and agreed to proceed.   Details of the encounter are shown below.  HPI  Veronica Russell is a 58 y.o.-year-old female, returning for follow-up for h/o subclinical hyperthyroidism, left thyroid nodule, and DM2, non-insulin-dependent.  Last visit 4 months ago.  She had the flu 03/2018 >> was on steroids >> sugars higher.  Subclinical hyperthyroidism:  Patient was found to have a slightly low TSH at her first visit in the weight loss clinic in 01/2016.  Her TFTs normalized to the lower limit of normal since then.  Her free thyroid hormones are normal.  I reviewed her TFTs: Lab Results  Component Value Date   TSH 0.50 10/05/2017   TSH 0.52 04/04/2017   TSH 0.436 (L) 09/29/2016   TSH 0.443 (L) 05/09/2016   TSH 0.425 (L) 01/27/2016   FREET4 0.75 10/05/2017   FREET4 0.75 04/04/2017   FREET4 1.17 09/29/2016   FREET4 1.24 05/09/2016   FREET4 1.17 01/27/2016    Lab Results  Component Value Date   T3FREE 3.9 10/05/2017   T3FREE 3.7 04/04/2017    She continues to experience: Hot flashes, chronic tremors (hereditary), palpitations with exertion and chronic hair loss.  She denies fatigue, weight loss, anxiety, hyper defecation.  Pt does have a FH of thyroid ds. - M aunt. No FH of thyroid cancer. No h/o radiation tx to head or neck.  No seaweed or kelp. No recent contrast studies. No herbal supplements. No Biotin use. No recent steroids use.   Left thyroid nodule:  Reviewed previous  studies: 04/14/2017: 2.9 x 2 x 2.7 solid, hypoechoic, left thyroid nodule 05/11/2017: FNA of the nodule: Benign  Pt denies: - feeling nodules in neck - hoarseness - dysphagia - choking - SOB with lying down  DM2:  Review latest HbA1c levels: Lab Results  Component Value Date   HGBA1C 7.2 (H) 03/07/2018   HGBA1C 7.0 (A) 01/10/2018   HGBA1C 7.4 (H) 10/26/2017   HGBA1C 7.1 (A) 10/05/2017   HGBA1C 7.8 05/10/2017   HGBA1C 6.6 (H) 09/29/2016   HGBA1C 6.5 (H) 05/09/2016   HGBA1C 6.7 (H) 01/27/2016   HGBA1C 6.9 01/12/2016   HGBA1C 6.6 07/21/2015   She is on: - Metformin ER 1000 mg at dinnertime - Victoza 1.2 mg + 5 clicks in a.m. >>  1.8 mg daily (increased few days ago >> no diarrhea) - Glipizide 5 mg before dinner  She checks her sugars twice a day: - am: 126-159 >> 138-166, 171 >> 150s >> 130-140 after increasing Victoza - 2h after b'fast: n/c - lunch: n/c >> 139 >> n/c - 2h after lunch: n/c - dinner: n/c >> 125-177 >> 105, 115, 125 - 2h after dinner: 103-280 >> 114-193, 224, 238 >> 160-180 - bedtime: n/c Lowest: 103 >> 114 >>105 ; hypoglycemia awareness in the 70s. Highest: 280 (tea) >> 238 >> 390 (40 mg Prednisone).  Meter: Accu-Chek  She continues to drink half-and-half tea, occasional regular Coca-Cola.  No CKD: Lab Results  Component Value Date   BUN 10  03/15/2018   Lab Results  Component Value Date   CREATININE 0.80 03/15/2018  On losartan.  + HL: Lab Results  Component Value Date   CHOL 165 03/07/2018   HDL 54 03/07/2018   LDLCALC 83 03/07/2018   TRIG 138 03/07/2018  On Lipitor 20.  Last eye exam: 08/2017: No DR, + glaucoma  She also has HTN, asthma, vitamin D deficiency.  She has a history of hair loss and is seeing Dr. Lois Huxley, dermatologist at [facility].  She specializes in hair loss.  ROS: Constitutional: no weight gain/no weight loss, no fatigue, no subjective hyperthermia, no subjective hypothermia Eyes: no blurry vision, no  xerophthalmia ENT: no sore throat, no nodules palpated in neck, no dysphagia, no odynophagia, no hoarseness Cardiovascular: no CP/no SOB/no palpitations/no leg swelling Respiratory: no cough/no SOB/no wheezing Gastrointestinal: no N/no V/no D/no C/no acid reflux Musculoskeletal: no muscle aches/no joint aches Skin: no rashes, + hair loss Neurological: no tremors/no numbness/no tingling/no dizziness  I reviewed pt's medications, allergies, PMH, social hx, family hx, and changes were documented in the history of present illness. Otherwise, unchanged from my initial visit note.  Past Medical History:  Diagnosis Date  . Alopecia   . Asthma   . Back pain   . Diabetes ([place])   . Diabetes mellitus ([place])   . GERD (gastroesophageal reflux disease)   . Hyperlipidemia   . Hypertension   . Joint pain   . Lactose intolerance   . Obesity   . Raynauds syndrome   . Swelling   . Tremor   . Vitamin D deficiency    Past Surgical History:  Procedure Laterality Date  . ABDOMINAL HYSTERECTOMY    . BREAST BIOPSY    . MYOMECTOMY     Social History   Socioeconomic History  . Marital status: Single    Spouse name: Not on file  . Number of children: no  Social Needs  Occupational History  . Occupation: Veronica Russell    Employer: [employer]  Tobacco Use  . Smoking status: Never Smoker  . Smokeless tobacco: Never Used  Substance and Sexual Activity  . Alcohol use:  Wine, 2-3 times a year, 1 drink  . Drug use: No   Current Outpatient Medications on File Prior to Visit  Medication Sig Dispense Refill  . albuterol (VENTOLIN HFA) 108 (90 Base) MCG/ACT inhaler Inhale 2 puffs into the lungs every 6 (six) hours as needed for wheezing or shortness of breath. 1 Inhaler 0  . Alcohol Swabs (ALCOHOL WIPES) 70 % PADS 30 Packages by Does not apply route 2 (two) times daily. 100 each 0  . amLODipine (NORVASC) 5 MG tablet Take 5 mg by mouth daily.    Marland Kitchen atorvastatin (LIPITOR) 20 MG tablet Take 20 mg by  mouth daily.    . benzonatate (TESSALON) 100 MG capsule Take 1-2 capsules (100-200 mg total) by mouth 3 (three) times daily as needed for cough. (Patient not taking: Reported on 04/04/2018) 40 capsule 0  . Cholecalciferol 4000 units CAPS Take 4,000 Units by mouth daily.     Marland Kitchen glipiZIDE (GLUCOTROL) 5 MG tablet Take 1 tablet (5 mg total) by mouth daily before supper. 90 tablet 3  . glucose blood (ACCU-CHEK GUIDE) test strip Use twice daily 100 each prn  . hydrochlorothiazide (HYDRODIURIL) 25 MG tablet Take 25 mg by mouth every morning.  1  . Insulin Pen Needle (BD PEN NEEDLE NANO U/F) 32G X 4 MM MISC 1 Package by Does  not apply route daily. 100 each prn  . Lancets (ACCU-CHEK SAFE-T PRO) lancets Use as instructed 60 each 0  . liraglutide (VICTOZA) 18 MG/3ML SOPN INJECT 1.8 MGS INTO THE SKIN EVERY MORNING. 9 mL 0  . losartan (COZAAR) 50 MG tablet Take 50 mg by mouth daily.     . Melatonin 3 MG TABS Take 1 tablet by mouth at bedtime as needed.    . metFORMIN (GLUCOPHAGE-XR) 500 MG 24 hr tablet Take 2 tablets (1,000 mg total) by mouth at bedtime. 180 tablet 3  . montelukast (SINGULAIR) 10 MG tablet Take 10 mg by mouth at bedtime.    Marland Kitchen oseltamivir (TAMIFLU) 75 MG capsule Take 1 capsule (75 mg total) by mouth 2 (two) times daily. (Patient not taking: Reported on 04/04/2018) 10 capsule 0  . predniSONE (DELTASONE) 20 MG tablet Take 2 tablets (40 mg total) by mouth daily. (Patient not taking: Reported on 04/04/2018) 10 tablet 0  . propranolol (INDERAL) 20 MG tablet Take 20 mg by mouth daily.      No current facility-administered medications on file prior to visit.    Allergies  Allergen Reactions  . Erythromycin Nausea Only  . Milk-Related Compounds Other (See Comments)    Lactose intollerance   Family History  Problem Relation Age of Onset  . Multiple myeloma Mother   . Hypertension Mother   . Hyperlipidemia Mother   . Cancer Mother   . Obesity Mother   . Hypertension Father   . Hyperlipidemia  Father   . Cancer Father   . Asthma Other   . Hyperlipidemia Other   . Hypertension Other   . Cancer Other   . COPD Other   . Stroke Other   . Breast cancer Maternal Aunt   . Breast cancer Paternal Aunt   . Breast cancer Maternal Aunt     PE: LMP 08/31/2008  Wt Readings from Last 3 Encounters:  04/12/18 265 lb (120.2 kg)  04/04/18 259 lb (117.5 kg)  03/15/18 267 lb 13.7 oz (121.5 kg)   Constitutional:  in NAD  The physical exam was not performed (virtual visit).  ASSESSMENT: 1.  Mild subclinical thyrotoxicosis  2.  left thyroid nodule Thyroid ultrasound: FINDINGS: Parenchymal Echotexture: Mildly heterogenous Isthmus: 0.5 cm Right lobe: 4.5 x 2.1 x 1.3 cm Left lobe: 4.3 x 1.4 x 1.6 cm _________________________________________________________  Nodule # 1: Location: Right; Inferior Maximum size: 2.9 cm; Other 2 dimensions: 2.0 x 2.1 cm Composition: solid/almost completely solid (2) Echogenicity: hypoechoic (2) Shape: not taller-than-wide (0) Margins: smooth (0) Echogenic foci: none (0) ACR TI-RADS total points: 4. ACR TI-RADS risk category: TR4 (4-6 points).  ACR TI-RADS recommendations: **Given size (>/= 1.5 cm) and appearance, fine needle aspiration of this moderately suspicious nodule should be considered based on TI-RADS criteria.________________________________________________  Multiple other smaller nodules measure 0.7 cm or less in size and do not meet criteria for biopsy nor follow-up.  IMPRESSION: Right lower pole nodule 1 meets criteria for fine needle aspiration biopsy.  Electronically Signed By: Marybelle Killings M.D. On: 04/14/2017 08:17   FNA: Adequacy Reason Satisfactory For Evaluation. Diagnosis THYROID, FINE NEEDLE ASPIRATION, RLP (SPECIMEN 1 OF 1,COLLECTED 05/11/17): CONSISTENT WITH BENIGN FOLLICULAR NODULE ([place] CATEGORY II). Enid Cutter MD Pathologist, Electronic Signature (Case signed 05/15/2017) Specimen Clinical  Information Right Inferior, 2.9cm; Other 2 dimensions: 2.0 x 2.1cm, solid/almost completely solid, hypoechoic, TI-RADS total points - 4, Moderately suspicious nodule Source Thyroid, Fine Needle Aspiration, Right, RLP (Specimen 1 of 1, collected on 05/11/17)  PLAN:  1. Patient with history of slightly low TSH with normal free hormones (subclinical thyrotoxicosis), with some thyrotoxic symptoms: Hot flashes (however, she is postmenopausal), palpitations with exertion, hair loss.  She denies weight loss, anxiety, hyper defecation.  Her TFTs normalized after 3 values returned slightly lower than the lower limit of normal and they have been normal x2 with last panel checked 10/2017. -We will need to recheck her TFTs when she returns to the clinic.  If they start to worsen, she may need a thyroid uptake and scan  2. L thyroid nodule -Initially felt on palpation -She denies neck compression symptoms -I reviewed the report of her most recent thyroid ultrasound from 03/2017 and this shows a 2.9 cm nodule, hypoechoic, solid.  We biopsied the nodule a year ago.  The results were benign. -No intervention is needed for now, but we will repeat the ultrasound in 2 years from the previous  3. DM2 -She continues on metformin and Victoza -We discussed at last visits about the importance of controlling her diet and I made specific suggestions.  I strongly recommended that she stop drinking carbohydrate rich beverages.  Her latest HbA1c was checked 2.5 months ago and this is increased from 7.0 to 7.2%. -Since last visit, she started to go to the [facility] weight loss clinic and I expect her blood sugars were improved with subsequent dietary interventions -At last visit, sugars were in the 140-150 range, with the exception of occasional spikes due to dietary indiscretions.  She had late dinners and the sugars after dinner were higher than target.  We discussed about moving dinner earlier.  I also suggested to start  glipizide, low dose, before dinner to help with post prandial hyperglycemia and subsequently, with the morning elevated CBGs. She did so and sugars improved after dinner. - at this visit, she tells me few days ago, she increased Victoza >> sugars improved in am  And she does not have any more diarrhea from the higher dose >> will continue this - We will recheck her HbA1c when she returns to the clinic - continue checking sugars at different times of the day - check 1x a day, rotating checks - advised for yearly eye exams >> she is UTD - Return to clinic in 3 mo with sugar log    Philemon Kingdom, MD PhD [facility]

## 2018-05-15 NOTE — Patient Instructions (Signed)
Please continue: - Metformin ER 1000 mg at dinnertime - Glipizide 5 mg before dinner - Victoza 1.8 mg in a.m.  Please return in 3 months with your sugar log.

## 2018-05-23 ENCOUNTER — Other Ambulatory Visit (INDEPENDENT_AMBULATORY_CARE_PROVIDER_SITE_OTHER): Payer: Self-pay | Admitting: Physician Assistant

## 2018-05-23 DIAGNOSIS — E119 Type 2 diabetes mellitus without complications: Secondary | ICD-10-CM

## 2018-05-23 MED FILL — MONTELUKAST SOD 10 MG TAB: 10 | 90 days supply | Qty: 90 | Fill #0

## 2018-05-23 MED FILL — LOSARTAN POTASSIUM 50 MG TA: 50 | 90 days supply | Qty: 90 | Fill #0

## 2018-05-23 MED FILL — AMLODIPINE BESYLATE 5 MG TA: 5 | 90 days supply | Qty: 90 | Fill #0

## 2018-05-23 MED FILL — PROPRANOLOL 20 MG TABLET: 20 | 90 days supply | Qty: 90 | Fill #0

## 2018-05-28 ENCOUNTER — Encounter (INDEPENDENT_AMBULATORY_CARE_PROVIDER_SITE_OTHER): Payer: Self-pay | Admitting: Physician Assistant

## 2018-05-28 ENCOUNTER — Other Ambulatory Visit: Payer: Self-pay

## 2018-05-28 ENCOUNTER — Ambulatory Visit (INDEPENDENT_AMBULATORY_CARE_PROVIDER_SITE_OTHER): Payer: 59 | Admitting: Physician Assistant

## 2018-05-28 DIAGNOSIS — E119 Type 2 diabetes mellitus without complications: Secondary | ICD-10-CM | POA: Diagnosis not present

## 2018-05-28 DIAGNOSIS — E66813 Obesity, class 3: Secondary | ICD-10-CM

## 2018-05-28 DIAGNOSIS — Z6841 Body Mass Index (BMI) 40.0 and over, adult: Secondary | ICD-10-CM | POA: Diagnosis not present

## 2018-05-28 NOTE — Progress Notes (Signed)
Office: [phone]  /  Fax: [phone] TeleHealth Visit:  Veronica Russell has verbally consented to this TeleHealth visit today. The patient is located at work, the provider is located at the [employer] [facility] office. The participants in this visit include the listed provider and patient. The visit was conducted today via FaceTime.  HPI:   Chief Complaint: OBESITY Veronica Russell is here to discuss her progress with her obesity treatment plan. She is on the Category 2 plan and is following her eating plan approximately 60% of the time. She states she is exercising 0 minutes 0 times per week. Veronica Russell reports that she has not been meal planning and is not eating enough protein throughout the day. She states she is going to start working from home tomorrow. We were unable to weigh the patient today for this TeleHealth visit. She feels as if she has gained 2 lbs since her last visit. She has lost 0 lbs since starting treatment with [name].  Diabetes II Veronica Russell has a diagnosis of diabetes type II and is on Victoza, metformin, and glipizide. Last A1c was 7.2 on 03/07/2018. She has been working on intensive lifestyle modifications including diet, exercise, and weight loss to help control her blood glucose levels. No nausea, vomiting, or diarrhea.  ASSESSMENT AND PLAN:  Type 2 diabetes mellitus without complication, without long-term current use of insulin (HCC)  Class 3 severe obesity with serious comorbidity and body mass index (BMI) of 40.0 to 44.9 in adult, unspecified obesity type (Veronica Russell)  PLAN:  Diabetes II Veronica Russell has been given extensive diabetes education by myself today including ideal fasting and post-prandial blood glucose readings, individual ideal Hgb A1c goals  and hypoglycemia prevention. We discussed the importance of good blood sugar control to decrease the likelihood of diabetic complications such as nephropathy, neuropathy, limb loss, blindness, coronary artery disease, and death. We  discussed the importance of intensive lifestyle modification including diet, exercise and weight loss as the first line treatment for diabetes. Veronica Russell agrees to continue her diabetes medications, weight loss, and will follow-up at the agreed upon time.  Obesity Veronica Russell is currently in the action stage of change. As such, her goal is to continue with weight loss efforts. She has agreed to follow the Category 2 plan. Veronica Russell has been instructed to work up to a goal of 150 minutes of combined cardio and strengthening exercise per week for weight loss and overall health benefits. We discussed the following Behavioral Modification Strategies today: increasing lean protein intake, work on meal planning and easy cooking plans.  Veronica Russell has agreed to follow-up with our clinic in 2 weeks. She was informed of the importance of frequent follow-up visits to maximize her success with intensive lifestyle modifications for her multiple health conditions.  ALLERGIES: Allergies  Allergen Reactions  . Erythromycin Nausea Only  . Milk-Related Compounds Other (See Comments)    Lactose intollerance    MEDICATIONS: Current Outpatient Medications on File Prior to Visit  Medication Sig Dispense Refill  . albuterol (VENTOLIN HFA) 108 (90 Base) MCG/ACT inhaler Inhale 2 puffs into the lungs every 6 (six) hours as needed for wheezing or shortness of breath. 1 Inhaler 0  . Alcohol Swabs (ALCOHOL WIPES) 70 % PADS 30 Packages by Does not apply route 2 (two) times daily. 100 each 0  . amLODipine (NORVASC) 5 MG tablet Take 5 mg by mouth daily.    Veronica Russell Kitchen atorvastatin (LIPITOR) 20 MG tablet Take 20 mg by mouth daily.    . Cholecalciferol  4000 units CAPS Take 4,000 Units by mouth daily.     Veronica Russell Kitchen glipiZIDE (GLUCOTROL) 5 MG tablet Take 1 tablet (5 mg total) by mouth daily before supper. 90 tablet 3  . glucose blood (ACCU-CHEK GUIDE) test strip Use twice daily 100 each prn  . hydrochlorothiazide (HYDRODIURIL) 25 MG tablet Take 25 mg by  mouth every morning.  1  . Insulin Pen Needle (BD PEN NEEDLE NANO U/F) 32G X 4 MM MISC 1 Package by Does not apply route daily. 100 each prn  . Lancets (ACCU-CHEK SAFE-T PRO) lancets Use as instructed 60 each 0  . liraglutide (VICTOZA) 18 MG/3ML SOPN INJECT 1.8 MGS INTO THE SKIN EVERY MORNING. 9 mL 0  . losartan (COZAAR) 50 MG tablet Take 50 mg by mouth daily.     . Melatonin 3 MG TABS Take 1 tablet by mouth at bedtime as needed.    . metFORMIN (GLUCOPHAGE-XR) 500 MG 24 hr tablet Take 2 tablets (1,000 mg total) by mouth at bedtime. 180 tablet 3  . montelukast (SINGULAIR) 10 MG tablet Take 10 mg by mouth at bedtime.    . propranolol (INDERAL) 20 MG tablet Take 20 mg by mouth daily.      No current facility-administered medications on file prior to visit.     PAST MEDICAL HISTORY: Past Medical History:  Diagnosis Date  . Alopecia   . Asthma   . Back pain   . Diabetes ([place])   . Diabetes mellitus ([place])   . GERD (gastroesophageal reflux disease)   . Hyperlipidemia   . Hypertension   . Joint pain   . Lactose intolerance   . Obesity   . Raynauds syndrome   . Swelling   . Tremor   . Vitamin D deficiency     PAST SURGICAL HISTORY: Past Surgical History:  Procedure Laterality Date  . ABDOMINAL HYSTERECTOMY    . BREAST BIOPSY    . MYOMECTOMY      SOCIAL HISTORY: Social History   Tobacco Use  . Smoking status: Never Smoker  . Smokeless tobacco: Never Used  Substance Use Topics  . Alcohol use: No  . Drug use: No    FAMILY HISTORY: Family History  Problem Relation Age of Onset  . Multiple myeloma Mother   . Hypertension Mother   . Hyperlipidemia Mother   . Cancer Mother   . Obesity Mother   . Hypertension Father   . Hyperlipidemia Father   . Cancer Father   . Asthma Other   . Hyperlipidemia Other   . Hypertension Other   . Cancer Other   . COPD Other   . Stroke Other   . Breast cancer Maternal Aunt   . Breast cancer Paternal Aunt   . Breast cancer Maternal  Aunt    ROS: Review of Systems  Gastrointestinal: Negative for diarrhea, nausea and vomiting.   PHYSICAL EXAM: Pt in no acute distress  RECENT LABS AND TESTS: BMET    Component Value Date/Time   NA 140 03/15/2018 1739   NA 143 03/07/2018 0909   K 3.2 (L) 03/15/2018 1739   CL 98 03/15/2018 1739   CO2 32 03/15/2018 1739   GLUCOSE 161 (H) 03/15/2018 1739   BUN 10 03/15/2018 1739   BUN 13 03/07/2018 0909   CREATININE 0.80 03/15/2018 1739   CALCIUM 9.7 03/15/2018 1739   GFRNONAA >60 03/15/2018 1739   GFRAA >60 03/15/2018 1739   Lab Results  Component Value Date   HGBA1C 7.2 (H) 03/07/2018  HGBA1C 7.0 (A) 01/10/2018   HGBA1C 7.4 (H) 10/26/2017   HGBA1C 7.1 (A) 10/05/2017   HGBA1C 7.8 05/10/2017   Lab Results  Component Value Date   INSULIN 43.1 (H) 03/07/2018   INSULIN 56.5 (H) 10/26/2017   INSULIN 39.0 (H) 09/29/2016   INSULIN 38.5 (H) 05/09/2016   INSULIN 27.3 (H) 01/27/2016   CBC    Component Value Date/Time   WBC 6.4 03/15/2018 1739   RBC 4.99 03/15/2018 1739   HGB 14.1 03/15/2018 1739   HGB 12.1 01/27/2016 1006   HCT 44.4 03/15/2018 1739   HCT 37.0 01/27/2016 1006   PLT 216 03/15/2018 1739   MCV 89.0 03/15/2018 1739   MCV 86 01/27/2016 1006   MCH 28.3 03/15/2018 1739   MCHC 31.8 03/15/2018 1739   RDW 13.4 03/15/2018 1739   RDW 13.3 01/27/2016 1006   LYMPHSABS 2.0 01/27/2016 1006   EOSABS 0.2 01/27/2016 1006   BASOSABS 0.0 01/27/2016 1006   Iron/TIBC/Ferritin/ %Sat No results found for: IRON, TIBC, FERRITIN, IRONPCTSAT Lipid Panel     Component Value Date/Time   CHOL 165 03/07/2018 0909   TRIG 138 03/07/2018 0909   HDL 54 03/07/2018 0909   LDLCALC 83 03/07/2018 0909   Hepatic Function Panel     Component Value Date/Time   PROT 7.3 03/15/2018 1739   PROT 6.9 03/07/2018 0909   ALBUMIN 3.7 03/15/2018 1739   ALBUMIN 4.1 03/07/2018 0909   AST 36 03/15/2018 1739   ALT 49 (H) 03/15/2018 1739   ALKPHOS 67 03/15/2018 1739   BILITOT 0.9  03/15/2018 1739   BILITOT 0.3 03/07/2018 0909      Component Value Date/Time   TSH 0.50 10/05/2017 0956   TSH 0.52 04/04/2017 0859   TSH 0.436 (L) 09/29/2016 0937   Results for FARREN, LANDA (MRN [id number]) as of 05/28/2018 12:59  Ref. Range 03/07/2018 09:09  Vitamin D, 25-Hydroxy Latest Ref Range: 30.0 - 100.0 ng/mL 44.9   I, Michaelene Song, am acting as Location manager for [employer], PA-C I, Abby Potash, PA-C have reviewed above note and agree with its content

## 2018-06-08 MED FILL — HYDROCHLOROTHIAZIDE 25 MG T: 25 | 90 days supply | Qty: 90 | Fill #0

## 2018-06-08 MED FILL — VICTOZA 18 MG/3 ML INJECT P: 18 | 30 days supply | Qty: 9 | Fill #0

## 2018-06-12 ENCOUNTER — Ambulatory Visit (INDEPENDENT_AMBULATORY_CARE_PROVIDER_SITE_OTHER): Payer: 59 | Admitting: Physician Assistant

## 2018-06-12 ENCOUNTER — Other Ambulatory Visit: Payer: Self-pay

## 2018-06-12 DIAGNOSIS — Z6841 Body Mass Index (BMI) 40.0 and over, adult: Secondary | ICD-10-CM | POA: Diagnosis not present

## 2018-06-12 DIAGNOSIS — E119 Type 2 diabetes mellitus without complications: Secondary | ICD-10-CM | POA: Diagnosis not present

## 2018-06-13 NOTE — Progress Notes (Signed)
Office: [phone]  /  Fax: [phone] TeleHealth Visit:  Veronica Russell has verbally consented to this TeleHealth visit today. The patient is located at work, the provider is located at the [employer] [facility] office. The participants in this visit include the listed provider and patient. The visit was conducted today via Webex.  HPI:   Chief Complaint: OBESITY Veronica Russell is here to discuss her progress with her obesity treatment plan. She is on the Category 2 plan and is following her eating plan approximately 65% of the time. She states she is exercising 0 minutes 0 times per week. Rejeana reports that she continues to struggle getting all of her protein in. She is drinking Community education officer for breakfast for convenience. We were unable to weigh the patient today for this TeleHealth visit. She feels as if she has maintained her weight since her last visit. She has lost 0 lbs since starting treatment with [name].  Diabetes II Veronica Russell has a diagnosis of diabetes type II and is on glipizide, Victoza, and metformin. Veronica Russell states fasting blood sugars are in the range of 95 and 140. Last A1c was 7.2 on 03/07/2018. She has been working on intensive lifestyle modifications including diet, exercise, and weight loss to help control her blood glucose levels.  ASSESSMENT AND PLAN:  Type 2 diabetes mellitus without complication, without long-term current use of insulin (HCC)  Class 3 severe obesity with serious comorbidity and body mass index (BMI) of 40.0 to 44.9 in adult, unspecified obesity type ([place])  PLAN:  Diabetes II Veronica Russell has been given extensive diabetes education by myself today including ideal fasting and post-prandial blood glucose readings, individual ideal HgA1c goals  and hypoglycemia prevention. We discussed the importance of good blood sugar control to decrease the likelihood of diabetic complications such as nephropathy, neuropathy, limb loss, blindness, coronary artery  disease, and death. We discussed the importance of intensive lifestyle modification including diet, exercise and weight loss as the first line treatment for diabetes. Veronica Russell agrees to continue her diabetes medications and will follow-up at the agreed upon time.  Obesity Veronica Russell is currently in the action stage of change. As such, her goal is to continue with weight loss efforts. She has agreed to follow the Category 2 plan. Veronica Russell has been instructed to work up to a goal of 150 minutes of combined cardio and strengthening exercise per week for weight loss and overall health benefits. We discussed the following Behavioral Modification Strategies today: increasing lean protein intake, work on meal planning and easy cooking plans.  Veronica Russell has agreed to follow-up with our clinic in 2 weeks. She was informed of the importance of frequent follow-up visits to maximize her success with intensive lifestyle modifications for her multiple health conditions.  ALLERGIES: Allergies  Allergen Reactions  . Erythromycin Nausea Only  . Milk-Related Compounds Other (See Comments)    Lactose intollerance    MEDICATIONS: Current Outpatient Medications on File Prior to Visit  Medication Sig Dispense Refill  . albuterol (VENTOLIN HFA) 108 (90 Base) MCG/ACT inhaler Inhale 2 puffs into the lungs every 6 (six) hours as needed for wheezing or shortness of breath. 1 Inhaler 0  . Alcohol Swabs (ALCOHOL WIPES) 70 % PADS 30 Packages by Does not apply route 2 (two) times daily. 100 each 0  . amLODipine (NORVASC) 5 MG tablet Take 5 mg by mouth daily.    Marland Kitchen atorvastatin (LIPITOR) 20 MG tablet Take 20 mg by mouth daily.    . Cholecalciferol 4000  units CAPS Take 4,000 Units by mouth daily.     Marland Kitchen glipiZIDE (GLUCOTROL) 5 MG tablet Take 1 tablet (5 mg total) by mouth daily before supper. 90 tablet 3  . glucose blood (ACCU-CHEK GUIDE) test strip Use twice daily 100 each prn  . hydrochlorothiazide (HYDRODIURIL) 25 MG tablet Take 25  mg by mouth every morning.  1  . Insulin Pen Needle (BD PEN NEEDLE NANO U/F) 32G X 4 MM MISC 1 Package by Does not apply route daily. 100 each prn  . Lancets (ACCU-CHEK SAFE-T PRO) lancets Use as instructed 60 each 0  . liraglutide (VICTOZA) 18 MG/3ML SOPN INJECT 1.8 MGS INTO THE SKIN EVERY MORNING. 9 mL 0  . losartan (COZAAR) 50 MG tablet Take 50 mg by mouth daily.     . Melatonin 3 MG TABS Take 1 tablet by mouth at bedtime as needed.    . metFORMIN (GLUCOPHAGE-XR) 500 MG 24 hr tablet Take 2 tablets (1,000 mg total) by mouth at bedtime. 180 tablet 3  . montelukast (SINGULAIR) 10 MG tablet Take 10 mg by mouth at bedtime.    . propranolol (INDERAL) 20 MG tablet Take 20 mg by mouth daily.      No current facility-administered medications on file prior to visit.     PAST MEDICAL HISTORY: Past Medical History:  Diagnosis Date  . Alopecia   . Asthma   . Back pain   . Diabetes ([place])   . Diabetes mellitus ([place])   . GERD (gastroesophageal reflux disease)   . Hyperlipidemia   . Hypertension   . Joint pain   . Lactose intolerance   . Obesity   . Raynauds syndrome   . Swelling   . Tremor   . Vitamin D deficiency     PAST SURGICAL HISTORY: Past Surgical History:  Procedure Laterality Date  . ABDOMINAL HYSTERECTOMY    . BREAST BIOPSY    . MYOMECTOMY      SOCIAL HISTORY: Social History   Tobacco Use  . Smoking status: Never Smoker  . Smokeless tobacco: Never Used  Substance Use Topics  . Alcohol use: No  . Drug use: No    FAMILY HISTORY: Family History  Problem Relation Age of Onset  . Multiple myeloma Mother   . Hypertension Mother   . Hyperlipidemia Mother   . Cancer Mother   . Obesity Mother   . Hypertension Father   . Hyperlipidemia Father   . Cancer Father   . Asthma Other   . Hyperlipidemia Other   . Hypertension Other   . Cancer Other   . COPD Other   . Stroke Other   . Breast cancer Maternal Aunt   . Breast cancer Paternal Aunt   . Breast cancer  Maternal Aunt    ROS: ROS none noted.  PHYSICAL EXAM: Pt in no acute distress  RECENT LABS AND TESTS: BMET    Component Value Date/Time   NA 140 03/15/2018 1739   NA 143 03/07/2018 0909   K 3.2 (L) 03/15/2018 1739   CL 98 03/15/2018 1739   CO2 32 03/15/2018 1739   GLUCOSE 161 (H) 03/15/2018 1739   BUN 10 03/15/2018 1739   BUN 13 03/07/2018 0909   CREATININE 0.80 03/15/2018 1739   CALCIUM 9.7 03/15/2018 1739   GFRNONAA >60 03/15/2018 1739   GFRAA >60 03/15/2018 1739   Lab Results  Component Value Date   HGBA1C 7.2 (H) 03/07/2018   HGBA1C 7.0 (A) 01/10/2018   HGBA1C 7.4 (  H) 10/26/2017   HGBA1C 7.1 (A) 10/05/2017   HGBA1C 7.8 05/10/2017   Lab Results  Component Value Date   INSULIN 43.1 (H) 03/07/2018   INSULIN 56.5 (H) 10/26/2017   INSULIN 39.0 (H) 09/29/2016   INSULIN 38.5 (H) 05/09/2016   INSULIN 27.3 (H) 01/27/2016   CBC    Component Value Date/Time   WBC 6.4 03/15/2018 1739   RBC 4.99 03/15/2018 1739   HGB 14.1 03/15/2018 1739   HGB 12.1 01/27/2016 1006   HCT 44.4 03/15/2018 1739   HCT 37.0 01/27/2016 1006   PLT 216 03/15/2018 1739   MCV 89.0 03/15/2018 1739   MCV 86 01/27/2016 1006   MCH 28.3 03/15/2018 1739   MCHC 31.8 03/15/2018 1739   RDW 13.4 03/15/2018 1739   RDW 13.3 01/27/2016 1006   LYMPHSABS 2.0 01/27/2016 1006   EOSABS 0.2 01/27/2016 1006   BASOSABS 0.0 01/27/2016 1006   Iron/TIBC/Ferritin/ %Sat No results found for: IRON, TIBC, FERRITIN, IRONPCTSAT Lipid Panel     Component Value Date/Time   CHOL 165 03/07/2018 0909   TRIG 138 03/07/2018 0909   HDL 54 03/07/2018 0909   LDLCALC 83 03/07/2018 0909   Hepatic Function Panel     Component Value Date/Time   PROT 7.3 03/15/2018 1739   PROT 6.9 03/07/2018 0909   ALBUMIN 3.7 03/15/2018 1739   ALBUMIN 4.1 03/07/2018 0909   AST 36 03/15/2018 1739   ALT 49 (H) 03/15/2018 1739   ALKPHOS 67 03/15/2018 1739   BILITOT 0.9 03/15/2018 1739   BILITOT 0.3 03/07/2018 0909      Component  Value Date/Time   TSH 0.50 10/05/2017 0956   TSH 0.52 04/04/2017 0859   TSH 0.436 (L) 09/29/2016 0937   Results for GUDRUN, AXE (MRN [id number]) as of 06/13/2018 09:42  Ref. Range 03/07/2018 09:09  Vitamin D, 25-Hydroxy Latest Ref Range: 30.0 - 100.0 ng/mL 44.9   I, Michaelene Song, am acting as Location manager for [employer], PA-C I, Abby Potash, PA-C have reviewed above note and agree with its content

## 2018-06-19 MED FILL — ACCU-CHEK GUIDE TEST STRIP: 50 days supply | Qty: 100 | Fill #1

## 2018-06-21 ENCOUNTER — Encounter: Payer: Self-pay | Admitting: Internal Medicine

## 2018-06-21 ENCOUNTER — Encounter (INDEPENDENT_AMBULATORY_CARE_PROVIDER_SITE_OTHER): Payer: Self-pay | Admitting: Physician Assistant

## 2018-06-21 NOTE — Telephone Encounter (Signed)
Please advise 

## 2018-06-28 ENCOUNTER — Ambulatory Visit (INDEPENDENT_AMBULATORY_CARE_PROVIDER_SITE_OTHER): Payer: 59 | Admitting: Physician Assistant

## 2018-07-02 ENCOUNTER — Ambulatory Visit (INDEPENDENT_AMBULATORY_CARE_PROVIDER_SITE_OTHER): Payer: 59 | Admitting: Family Medicine

## 2018-07-04 ENCOUNTER — Encounter (INDEPENDENT_AMBULATORY_CARE_PROVIDER_SITE_OTHER): Payer: Self-pay | Admitting: Family Medicine

## 2018-07-04 ENCOUNTER — Ambulatory Visit (INDEPENDENT_AMBULATORY_CARE_PROVIDER_SITE_OTHER): Payer: 59 | Admitting: Family Medicine

## 2018-07-04 ENCOUNTER — Other Ambulatory Visit: Payer: Self-pay

## 2018-07-04 DIAGNOSIS — E1165 Type 2 diabetes mellitus with hyperglycemia: Secondary | ICD-10-CM | POA: Diagnosis not present

## 2018-07-04 DIAGNOSIS — Z6841 Body Mass Index (BMI) 40.0 and over, adult: Secondary | ICD-10-CM

## 2018-07-04 MED FILL — VICTOZA 18 MG/3 ML INJECT P: 18 | 30 days supply | Qty: 9 | Fill #1

## 2018-07-04 NOTE — Progress Notes (Signed)
Office: [phone]  /  Fax: [phone] TeleHealth Visit:  Veronica Russell has verbally consented to this TeleHealth visit today. The patient is located at work, the provider is located at the [employer] [facility] office. The participants in this visit include the listed provider and patient. The visit was conducted today via Webex.  HPI:   Chief Complaint: OBESITY Veronica Russell is here to discuss her progress with her obesity treatment plan. She is on the Category 2 plan and is following her eating plan approximately 60% of the time. She states she is exercising 0 minutes 0 times per week. Veronica Russell states her weight is 259 lbs, reflecting weight maintenance according to the patient. She admits to eating more due to stress in the last few weeks. She does not feel she eats enough. She is eating dinner late at night due to caring for her mom and she sometimes skips meals.  She is not meal prepping for lunch and is often eating out for dinner. She lives alone and does not usually cook dinner. We were unable to weigh the patient today for this TeleHealth visit. She feels as if she has maintained her weight since her last visit. She has lost 0 lbs since starting treatment with [name].  Diabetes Mellitus with Hyperglycemia without Insulin [name] has a diagnosis of diabetes mellitus, which is managed by Dr. Cruzita Lederer and is not well controlled. She is currently on glipizide, Victoza, and metformin.  Anjolie states fasting blood sugars have been in the range of 120 to 140's and 2-hour postprandials in the 160's (depending on the meal). Last A1c was 7.2 on 03/07/2018. She has been working on intensive lifestyle modifications including diet, exercise, and weight loss to help control her blood glucose levels. She reports rare polyphagia. Lab Results  Component Value Date   HGBA1C 7.2 (H) 03/07/2018    ASSESSMENT AND PLAN:  Type 2 diabetes mellitus with hyperglycemia, without long-term current use of insulin (HCC)   Class 3 severe obesity with serious comorbidity and body mass index (BMI) of 40.0 to 44.9 in adult, unspecified obesity type ([place])  PLAN:  Diabetes Mellitus with Hyperglycemia without Insulin Veronica Russell has been given extensive diabetes education by myself today including ideal fasting and post-prandial blood glucose readings, individual ideal HgA1c goals  and hypoglycemia prevention. We discussed the importance of good blood sugar control to decrease the likelihood of diabetic complications such as nephropathy, neuropathy, limb loss, blindness, coronary artery disease, and death. We discussed the importance of intensive lifestyle modification including diet, exercise and weight loss as the first line treatment for diabetes. Veronica Russell agrees to continue her diabetes medications and will follow-up with Dr. Cruzita Lederer on 09/20/2018. We will check her A1c at her next visit.  I spent > than 50% of the 15 minute visit on counseling as documented in the note.  Obesity [name] is currently in the action stage of change. As such, her goal is to continue with weight loss efforts. She has agreed to follow the Category 2 plan and journal 400-500 calories and 35+ grams of protein at supper. She was instructed to journal if eating out at dinner. Handout on Eating Out was sent to the patient via My Chart as well as journaling numbers for dinner.  Veronica Russell has not been prescribed exercise at this time. We discussed the following Behavioral Modification Strategies today: increasing lean protein intake, decreasing simple carbohydrates, decrease eating out, work on meal planning and easy cooking plans, and planning for success.  Veronica Russell  has agreed to follow-up with our clinic in 2 weeks. She was informed of the importance of frequent follow-up visits to maximize her success with intensive lifestyle modifications for her multiple health conditions.  ALLERGIES: Allergies  Allergen Reactions  . Erythromycin Nausea Only  .  Milk-Related Compounds Other (See Comments)    Lactose intollerance    MEDICATIONS: Current Outpatient Medications on File Prior to Visit  Medication Sig Dispense Refill  . albuterol (VENTOLIN HFA) 108 (90 Base) MCG/ACT inhaler Inhale 2 puffs into the lungs every 6 (six) hours as needed for wheezing or shortness of breath. 1 Inhaler 0  . Alcohol Swabs (ALCOHOL WIPES) 70 % PADS 30 Packages by Does not apply route 2 (two) times daily. 100 each 0  . amLODipine (NORVASC) 5 MG tablet Take 5 mg by mouth daily.    Marland Kitchen atorvastatin (LIPITOR) 20 MG tablet Take 20 mg by mouth daily.    . Cholecalciferol 4000 units CAPS Take 4,000 Units by mouth daily.     Marland Kitchen glipiZIDE (GLUCOTROL) 5 MG tablet Take 1 tablet (5 mg total) by mouth daily before supper. 90 tablet 3  . glucose blood (ACCU-CHEK GUIDE) test strip Use twice daily 100 each prn  . hydrochlorothiazide (HYDRODIURIL) 25 MG tablet Take 25 mg by mouth every morning.  1  . Insulin Pen Needle (BD PEN NEEDLE NANO U/F) 32G X 4 MM MISC 1 Package by Does not apply route daily. 100 each prn  . Lancets (ACCU-CHEK SAFE-T PRO) lancets Use as instructed 60 each 0  . liraglutide (VICTOZA) 18 MG/3ML SOPN INJECT 1.8 MGS INTO THE SKIN EVERY MORNING. 9 mL 0  . losartan (COZAAR) 50 MG tablet Take 50 mg by mouth daily.     . Melatonin 3 MG TABS Take 1 tablet by mouth at bedtime as needed.    . metFORMIN (GLUCOPHAGE-XR) 500 MG 24 hr tablet Take 2 tablets (1,000 mg total) by mouth at bedtime. 180 tablet 3  . montelukast (SINGULAIR) 10 MG tablet Take 10 mg by mouth at bedtime.    . propranolol (INDERAL) 20 MG tablet Take 20 mg by mouth daily.      No current facility-administered medications on file prior to visit.     PAST MEDICAL HISTORY: Past Medical History:  Diagnosis Date  . Alopecia   . Asthma   . Back pain   . Diabetes ([place])   . Diabetes mellitus ([place])   . GERD (gastroesophageal reflux disease)   . Hyperlipidemia   . Hypertension   . Joint pain   .  Lactose intolerance   . Obesity   . Raynauds syndrome   . Swelling   . Tremor   . Vitamin D deficiency     PAST SURGICAL HISTORY: Past Surgical History:  Procedure Laterality Date  . ABDOMINAL HYSTERECTOMY    . BREAST BIOPSY    . MYOMECTOMY      SOCIAL HISTORY: Social History   Tobacco Use  . Smoking status: Never Smoker  . Smokeless tobacco: Never Used  Substance Use Topics  . Alcohol use: No  . Drug use: No    FAMILY HISTORY: Family History  Problem Relation Age of Onset  . Multiple myeloma Mother   . Hypertension Mother   . Hyperlipidemia Mother   . Cancer Mother   . Obesity Mother   . Hypertension Father   . Hyperlipidemia Father   . Cancer Father   . Asthma Other   . Hyperlipidemia Other   . Hypertension Other   .  Cancer Other   . COPD Other   . Stroke Other   . Breast cancer Maternal Aunt   . Breast cancer Paternal Aunt   . Breast cancer Maternal Aunt    ROS: Review of Systems  Endo/Heme/Allergies:       Positive for rare polyphagia.   PHYSICAL EXAM: Pt in no acute distress  RECENT LABS AND TESTS: BMET    Component Value Date/Time   NA 140 03/15/2018 1739   NA 143 03/07/2018 0909   K 3.2 (L) 03/15/2018 1739   CL 98 03/15/2018 1739   CO2 32 03/15/2018 1739   GLUCOSE 161 (H) 03/15/2018 1739   BUN 10 03/15/2018 1739   BUN 13 03/07/2018 0909   CREATININE 0.80 03/15/2018 1739   CALCIUM 9.7 03/15/2018 1739   GFRNONAA >60 03/15/2018 1739   GFRAA >60 03/15/2018 1739   Lab Results  Component Value Date   HGBA1C 7.2 (H) 03/07/2018   HGBA1C 7.0 (A) 01/10/2018   HGBA1C 7.4 (H) 10/26/2017   HGBA1C 7.1 (A) 10/05/2017   HGBA1C 7.8 05/10/2017   Lab Results  Component Value Date   INSULIN 43.1 (H) 03/07/2018   INSULIN 56.5 (H) 10/26/2017   INSULIN 39.0 (H) 09/29/2016   INSULIN 38.5 (H) 05/09/2016   INSULIN 27.3 (H) 01/27/2016   CBC    Component Value Date/Time   WBC 6.4 03/15/2018 1739   RBC 4.99 03/15/2018 1739   HGB 14.1 03/15/2018  1739   HGB 12.1 01/27/2016 1006   HCT 44.4 03/15/2018 1739   HCT 37.0 01/27/2016 1006   PLT 216 03/15/2018 1739   MCV 89.0 03/15/2018 1739   MCV 86 01/27/2016 1006   MCH 28.3 03/15/2018 1739   MCHC 31.8 03/15/2018 1739   RDW 13.4 03/15/2018 1739   RDW 13.3 01/27/2016 1006   LYMPHSABS 2.0 01/27/2016 1006   EOSABS 0.2 01/27/2016 1006   BASOSABS 0.0 01/27/2016 1006   Iron/TIBC/Ferritin/ %Sat No results found for: IRON, TIBC, FERRITIN, IRONPCTSAT Lipid Panel     Component Value Date/Time   CHOL 165 03/07/2018 0909   TRIG 138 03/07/2018 0909   HDL 54 03/07/2018 0909   LDLCALC 83 03/07/2018 0909   Hepatic Function Panel     Component Value Date/Time   PROT 7.3 03/15/2018 1739   PROT 6.9 03/07/2018 0909   ALBUMIN 3.7 03/15/2018 1739   ALBUMIN 4.1 03/07/2018 0909   AST 36 03/15/2018 1739   ALT 49 (H) 03/15/2018 1739   ALKPHOS 67 03/15/2018 1739   BILITOT 0.9 03/15/2018 1739   BILITOT 0.3 03/07/2018 0909      Component Value Date/Time   TSH 0.50 10/05/2017 0956   TSH 0.52 04/04/2017 0859   TSH 0.436 (L) 09/29/2016 0937   Results for ANDREEA, ARCA (MRN [id number]) as of 07/04/2018 16:04  Ref. Range 03/07/2018 09:09  Vitamin D, 25-Hydroxy Latest Ref Range: 30.0 - 100.0 ng/mL 44.9    I, Michaelene Song, am acting as Location manager for [employer], FNP-C.  I have reviewed the above documentation for accuracy and completeness, and I agree with the above.  - Dawn Whitmire, FNP-C.

## 2018-07-05 ENCOUNTER — Encounter (INDEPENDENT_AMBULATORY_CARE_PROVIDER_SITE_OTHER): Payer: Self-pay | Admitting: Family Medicine

## 2018-07-11 MED FILL — UNIFINE PENTIPS 32GX5/32": 32G X 4 MM | 90 days supply | Qty: 100 | Fill #1

## 2018-07-11 MED FILL — ACCU-CHEK FASTCLIX LANCETS: 90 days supply | Qty: 102 | Fill #2

## 2018-07-11 MED FILL — UNIFINE PENTIPS 32GX5/32: 32G X 4 MM | 90 days supply | Qty: 100 | Fill #1

## 2018-07-18 ENCOUNTER — Encounter (INDEPENDENT_AMBULATORY_CARE_PROVIDER_SITE_OTHER): Payer: Self-pay | Admitting: Physician Assistant

## 2018-07-18 NOTE — Telephone Encounter (Signed)
Please review

## 2018-07-19 ENCOUNTER — Ambulatory Visit (INDEPENDENT_AMBULATORY_CARE_PROVIDER_SITE_OTHER): Payer: 59 | Admitting: Physician Assistant

## 2018-07-19 ENCOUNTER — Other Ambulatory Visit: Payer: Self-pay

## 2018-07-19 ENCOUNTER — Encounter (INDEPENDENT_AMBULATORY_CARE_PROVIDER_SITE_OTHER): Payer: Self-pay | Admitting: Physician Assistant

## 2018-07-19 VITALS — BP 118/83 | HR 105 | Temp 98.1°F | Ht 64.0 in | Wt 257.0 lb

## 2018-07-19 DIAGNOSIS — E1165 Type 2 diabetes mellitus with hyperglycemia: Secondary | ICD-10-CM

## 2018-07-19 DIAGNOSIS — E559 Vitamin D deficiency, unspecified: Secondary | ICD-10-CM

## 2018-07-19 DIAGNOSIS — Z9189 Other specified personal risk factors, not elsewhere classified: Secondary | ICD-10-CM | POA: Diagnosis not present

## 2018-07-19 DIAGNOSIS — E119 Type 2 diabetes mellitus without complications: Secondary | ICD-10-CM

## 2018-07-19 DIAGNOSIS — E7849 Other hyperlipidemia: Secondary | ICD-10-CM | POA: Diagnosis not present

## 2018-07-19 MED ORDER — VICTOZA 18 MG/3ML ~~LOC~~ SOPN: PEN_INJECTOR | SUBCUTANEOUS | 0 refills | Status: DC

## 2018-07-19 NOTE — Progress Notes (Signed)
Office: [phone]  /  Fax: [phone]   HPI:   Chief Complaint: OBESITY Veronica Russell is here to discuss her progress with her obesity treatment plan. She is on the  follow the Category 2 plan and is following her eating plan approximately 60% of the time. She states she is exercising 0 minutes 0 times per week. Veronica Russell reports that her schedule is busy at work once again and she is not getting in all of the food on the plan.  Her weight is 257 lb (116.6 kg) today and has had a weight loss of 2 pounds over a period of 15 weeks since her last in office visit. She has lost 0 lbs since starting treatment with [name].  Diabetes II With Hyperglycemia Without Insulin Veronica Russell has a diagnosis of diabetes type II and is on glimepiride, Victoza, and metformin. Gari states fasting blood sugars average 134. She denies any hypoglycemic episodes. Last A1c was 7.2 on 03/07/2018. She has been working on intensive lifestyle modifications including diet, exercise, and weight loss to help control her blood glucose levels.  Hyperlipidemia Veronica Russell has hyperlipidemia and has been trying to improve her cholesterol levels with intensive lifestyle modification including a low saturated fat diet, exercise and weight loss. She is on atorvastatin and denies any chest pain.   At risk for cardiovascular disease Veronica Russell is at a higher than average risk for cardiovascular disease due to obesity. She currently denies any chest pain.  Vitamin D deficiency Veronica Russell has a diagnosis of Vitamin D deficiency. She is currently taking OTC Vit D and denies nausea, vomiting or muscle weakness.  ASSESSMENT AND PLAN:  Type 2 diabetes mellitus with hyperglycemia, without long-term current use of insulin ([place]) - Plan: Comprehensive metabolic panel, Hemoglobin A1c, Insulin, random  Other hyperlipidemia - Plan: Comprehensive metabolic panel, Hemoglobin A1c, Insulin, random, Lipid Panel With LDL/HDL Ratio  Vitamin D deficiency - Plan: VITAMIN D 25  Hydroxy (Vit-D Deficiency, Fractures)  At risk for heart disease  Type 2 diabetes mellitus without complication, without long-term current use of insulin (Veronica Russell) - Plan: liraglutide (VICTOZA) 18 MG/3ML SOPN  PLAN:  Diabetes II With Hyperglycemia Without Insulin Veronica Russell has been given extensive diabetes education by myself today including ideal fasting and post-prandial blood glucose readings, individual ideal HgA1c goals  and hypoglycemia prevention. We discussed the importance of good blood sugar control to decrease the likelihood of diabetic complications such as nephropathy, neuropathy, limb loss, blindness, coronary artery disease, and death. We discussed the importance of intensive lifestyle modification including diet, exercise and weight loss as the first line treatment for diabetes. Veronica Russell was given a refill on her Victoza #3 with 0 refills and she agrees to follow-up with our clinic in 3-4 weeks. She will have labs checked.  Hyperlipidemia Veronica Russell was informed of the American Heart Association Guidelines emphasizing intensive lifestyle modifications as the first line treatment for hyperlipidemia. We discussed many lifestyle modifications today in depth, and Veronica Russell will continue to work on decreasing saturated fats such as fatty red meat, butter and many fried foods. She will continue atorvastatin, increase vegetables and lean protein in her diet, and continue to work on exercise and weight loss efforts. She will have labs checked.  Cardiovascular risk counseling Veronica Russell was given extended (15 minutes) coronary artery disease prevention counseling today. She is 58 y.o. female and has risk factors for heart disease including obesity. We discussed intensive lifestyle modifications today with an emphasis on specific weight loss instructions and strategies. Pt was also informed  of the importance of increasing exercise and decreasing saturated fats to help prevent heart disease.  Vitamin D  Deficiency Veronica Russell was informed that low Vitamin D levels contributes to fatigue and are associated with obesity, breast, and colon cancer. She agrees to continue taking OTC Vit D and will follow-up for routine testing of Vitamin D. She was informed of the risk of over-replacement of Vitamin D and agrees to not increase her dose unless she discusses this with [name] first. Veronica Russell agrees to follow-up with our clinic in 3-4 weeks.  Obesity Veronica Russell is currently in the action stage of change. As such, her goal is to continue with weight loss efforts. She has agreed to follow the Category 2 plan. Veronica Russell has been instructed to work up to a goal of 150 minutes of combined cardio and strengthening exercise per week for weight loss and overall health benefits. We discussed the following Behavioral Modification Strategies today: work on meal planning, easy cooking plans, and keeping healthy foods in the home.  Veronica Russell has agreed to follow-up with our clinic in 3-4 weeks. She was informed of the importance of frequent follow-up visits to maximize her success with intensive lifestyle modifications for her multiple health conditions.  ALLERGIES: Allergies  Allergen Reactions   Erythromycin Nausea Only   Milk-Related Compounds Other (See Comments)    Lactose intollerance    MEDICATIONS: Current Outpatient Medications on File Prior to Visit  Medication Sig Dispense Refill   albuterol (VENTOLIN HFA) 108 (90 Base) MCG/ACT inhaler Inhale 2 puffs into the lungs every 6 (six) hours as needed for wheezing or shortness of breath. 1 Inhaler 0   Alcohol Swabs (ALCOHOL WIPES) 70 % PADS 30 Packages by Does not apply route 2 (two) times daily. 100 each 0   amLODipine (NORVASC) 5 MG tablet Take 5 mg by mouth daily.     atorvastatin (LIPITOR) 20 MG tablet Take 20 mg by mouth daily.     Cholecalciferol 4000 units CAPS Take 4,000 Units by mouth daily.      glipiZIDE (GLUCOTROL) 5 MG tablet Take 1 tablet (5 mg total) by  mouth daily before supper. 90 tablet 3   glucose blood (ACCU-CHEK GUIDE) test strip Use twice daily 100 each prn   hydrochlorothiazide (HYDRODIURIL) 25 MG tablet Take 25 mg by mouth every morning.  1   Insulin Pen Needle (BD PEN NEEDLE NANO U/F) 32G X 4 MM MISC 1 Package by Does not apply route daily. 100 each prn   Lancets (ACCU-CHEK SAFE-T PRO) lancets Use as instructed 60 each 0   losartan (COZAAR) 50 MG tablet Take 50 mg by mouth daily.      Melatonin 3 MG TABS Take 1 tablet by mouth at bedtime as needed.     metFORMIN (GLUCOPHAGE-XR) 500 MG 24 hr tablet Take 2 tablets (1,000 mg total) by mouth at bedtime. 180 tablet 3   montelukast (SINGULAIR) 10 MG tablet Take 10 mg by mouth at bedtime.     propranolol (INDERAL) 20 MG tablet Take 20 mg by mouth daily.      No current facility-administered medications on file prior to visit.     PAST MEDICAL HISTORY: Past Medical History:  Diagnosis Date   Alopecia    Asthma    Back pain    Diabetes ([place])    Diabetes mellitus (HCC)    GERD (gastroesophageal reflux disease)    Hyperlipidemia    Hypertension    Joint pain    Lactose intolerance  Obesity    Raynauds syndrome    Swelling    Tremor    Vitamin D deficiency     PAST SURGICAL HISTORY: Past Surgical History:  Procedure Laterality Date   ABDOMINAL HYSTERECTOMY     BREAST BIOPSY     MYOMECTOMY      SOCIAL HISTORY: Social History   Tobacco Use   Smoking status: Never Smoker   Smokeless tobacco: Never Used  Substance Use Topics   Alcohol use: No   Drug use: No    FAMILY HISTORY: Family History  Problem Relation Age of Onset   Multiple myeloma Mother    Hypertension Mother    Hyperlipidemia Mother    Cancer Mother    Obesity Mother    Hypertension Father    Hyperlipidemia Father    Cancer Father    Asthma Other    Hyperlipidemia Other    Hypertension Other    Cancer Other    COPD Other    Stroke Other     Breast cancer Maternal Aunt    Breast cancer Paternal Aunt    Breast cancer Maternal Aunt    ROS: Review of Systems  Cardiovascular: Negative for chest pain.  Gastrointestinal: Negative for nausea and vomiting.  Musculoskeletal:       Negative for muscle weakness.  Endo/Heme/Allergies:       Negative for hypoglycemia.   PHYSICAL EXAM: Blood pressure 118/83, pulse (!) 105, temperature 98.1 F (36.7 C), temperature source Oral, height 5' 4" (1.626 m), weight 257 lb (116.6 kg), last menstrual period 08/31/2008, SpO2 97 %. Body mass index is 44.11 kg/m. Physical Exam Vitals signs reviewed.  Constitutional:      Appearance: Normal appearance. She is obese.  Cardiovascular:     Rate and Rhythm: Normal rate.     Pulses: Normal pulses.  Pulmonary:     Effort: Pulmonary effort is normal.     Breath sounds: Normal breath sounds.  Musculoskeletal: Normal range of motion.  Skin:    General: Skin is warm and dry.  Neurological:     Mental Status: She is alert and oriented to person, place, and time.  Psychiatric:        Behavior: Behavior normal.   RECENT LABS AND TESTS: BMET    Component Value Date/Time   NA 140 03/15/2018 1739   NA 143 03/07/2018 0909   K 3.2 (L) 03/15/2018 1739   CL 98 03/15/2018 1739   CO2 32 03/15/2018 1739   GLUCOSE 161 (H) 03/15/2018 1739   BUN 10 03/15/2018 1739   BUN 13 03/07/2018 0909   CREATININE 0.80 03/15/2018 1739   CALCIUM 9.7 03/15/2018 1739   GFRNONAA >60 03/15/2018 1739   GFRAA >60 03/15/2018 1739   Lab Results  Component Value Date   HGBA1C 7.2 (H) 03/07/2018   HGBA1C 7.0 (A) 01/10/2018   HGBA1C 7.4 (H) 10/26/2017   HGBA1C 7.1 (A) 10/05/2017   HGBA1C 7.8 05/10/2017   Lab Results  Component Value Date   INSULIN 43.1 (H) 03/07/2018   INSULIN 56.5 (H) 10/26/2017   INSULIN 39.0 (H) 09/29/2016   INSULIN 38.5 (H) 05/09/2016   INSULIN 27.3 (H) 01/27/2016   CBC    Component Value Date/Time   WBC 6.4 03/15/2018 1739   RBC 4.99  03/15/2018 1739   HGB 14.1 03/15/2018 1739   HGB 12.1 01/27/2016 1006   HCT 44.4 03/15/2018 1739   HCT 37.0 01/27/2016 1006   PLT 216 03/15/2018 1739   MCV 89.0 03/15/2018 1739  MCV 86 01/27/2016 1006   MCH 28.3 03/15/2018 1739   MCHC 31.8 03/15/2018 1739   RDW 13.4 03/15/2018 1739   RDW 13.3 01/27/2016 1006   LYMPHSABS 2.0 01/27/2016 1006   EOSABS 0.2 01/27/2016 1006   BASOSABS 0.0 01/27/2016 1006   Iron/TIBC/Ferritin/ %Sat No results found for: IRON, TIBC, FERRITIN, IRONPCTSAT Lipid Panel     Component Value Date/Time   CHOL 165 03/07/2018 0909   TRIG 138 03/07/2018 0909   HDL 54 03/07/2018 0909   LDLCALC 83 03/07/2018 0909   Hepatic Function Panel     Component Value Date/Time   PROT 7.3 03/15/2018 1739   PROT 6.9 03/07/2018 0909   ALBUMIN 3.7 03/15/2018 1739   ALBUMIN 4.1 03/07/2018 0909   AST 36 03/15/2018 1739   ALT 49 (H) 03/15/2018 1739   ALKPHOS 67 03/15/2018 1739   BILITOT 0.9 03/15/2018 1739   BILITOT 0.3 03/07/2018 0909      Component Value Date/Time   TSH 0.50 10/05/2017 0956   TSH 0.52 04/04/2017 0859   TSH 0.436 (L) 09/29/2016 0937   Results for CENIYA, FOWERS (MRN [id number]) as of 07/19/2018 12:48  Ref. Range 03/07/2018 09:09  Vitamin D, 25-Hydroxy Latest Ref Range: 30.0 - 100.0 ng/mL 44.9    OBESITY BEHAVIORAL INTERVENTION VISIT  Today's visit was #46    Starting weight: 259 lbs Starting date: 01/27/2016 Today's weight: 257 lbs  Today's date: 07/19/2018 Total lbs lost to date: 2  ASK: We discussed the diagnosis of obesity with [name] today and [name] agreed to give [name] permission to discuss obesity behavioral modification. therapy today.  ASSESS: Afrika has the diagnosis of obesity and her BMI today is 44.3. Apphia is in the action stage of change.   ADVISE: Clema was educated on the multiple health risks of obesity as well as the benefit of weight loss to improve her health. She was advised of the need for long term treatment  and the importance of lifestyle modifications to improve her current health and to decrease her risk of future health problems.  AGREE: Multiple dietary modification options and treatment options were discussed and  Darrion agreed to follow the recommendations documented in the above note.  ARRANGE: Lyann was educated on the importance of frequent visits to treat obesity as outlined per CMS and USPSTF guidelines and agreed to schedule her next follow up appointment today.  Migdalia Dk, am acting as transcriptionist for Abby Potash, PA-C I, Abby Potash, PA-C have reviewed above note and agree with its content

## 2018-07-20 LAB — COMPREHENSIVE METABOLIC PANEL
ALT: 32 IU/L (ref 0–32)
AST: 17 IU/L (ref 0–40)
Albumin/Globulin Ratio: 1.5 (ref 1.2–2.2)
Albumin: 4.1 g/dL (ref 3.8–4.9)
Alkaline Phosphatase: 92 IU/L (ref 39–117)
BUN/Creatinine Ratio: 21 (ref 9–23)
BUN: 13 mg/dL (ref 6–24)
Bilirubin Total: 0.4 mg/dL (ref 0.0–1.2)
CO2: 26 mmol/L (ref 20–29)
Calcium: 9.9 mg/dL (ref 8.7–10.2)
Chloride: 100 mmol/L (ref 96–106)
Creatinine, Ser: 0.62 mg/dL (ref 0.57–1.00)
GFR calc Af Amer: 115 mL/min/{1.73_m2} (ref >59)
GFR calc non Af Amer: 100 mL/min/{1.73_m2} (ref >59)
Globulin, Total: 2.7 g/dL (ref 1.5–4.5)
Glucose: 122 mg/dL — ABNORMAL HIGH (ref 65–99)
Potassium: 3.7 mmol/L (ref 3.5–5.2)
Sodium: 144 mmol/L (ref 134–144)
Total Protein: 6.8 g/dL (ref 6.0–8.5)

## 2018-07-20 LAB — VITAMIN D 25 HYDROXY (VIT D DEFICIENCY, FRACTURES): Vit D, 25-Hydroxy: 55.4 ng/mL (ref 30.0–100.0)

## 2018-07-20 LAB — LIPID PANEL WITH LDL/HDL RATIO
Cholesterol, Total: 173 mg/dL (ref 100–199)
HDL: 54 mg/dL (ref >39)
LDL Calculated: 90 mg/dL (ref 0–99)
LDl/HDL Ratio: 1.7 ratio (ref 0.0–3.2)
Triglycerides: 144 mg/dL (ref 0–149)
VLDL Cholesterol Cal: 29 mg/dL (ref 5–40)

## 2018-07-20 LAB — INSULIN, RANDOM: INSULIN: 42.1 u[IU]/mL — ABNORMAL HIGH (ref 2.6–24.9)

## 2018-07-20 LAB — HEMOGLOBIN A1C
Est. average glucose Bld gHb Est-mCnc: 148 mg/dL
Hgb A1c MFr Bld: 6.8 % — ABNORMAL HIGH (ref 4.8–5.6)

## 2018-07-25 MED FILL — glipiZIDE 5 MG TABS: 5 | 90 days supply | Qty: 90 | Fill #1

## 2018-07-25 MED FILL — ATORVASTATIN 20 MG TABLET: 20 | 90 days supply | Qty: 90 | Fill #1

## 2018-07-25 MED FILL — METFORMIN HCL ER 500 MG TB2: 500 | 30 days supply | Qty: 60 | Fill #1

## 2018-08-07 MED FILL — ACCU-CHEK GUIDE TEST STRIP: 50 days supply | Qty: 100 | Fill #0

## 2018-08-07 MED FILL — VICTOZA 18 MG/3 ML INJECT P: 18 | 30 days supply | Qty: 9 | Fill #1

## 2018-08-13 ENCOUNTER — Other Ambulatory Visit: Payer: Self-pay | Admitting: Family Medicine

## 2018-08-13 DIAGNOSIS — Z1231 Encounter for screening mammogram for malignant neoplasm of breast: Secondary | ICD-10-CM

## 2018-08-16 ENCOUNTER — Ambulatory Visit (INDEPENDENT_AMBULATORY_CARE_PROVIDER_SITE_OTHER): Payer: 59 | Admitting: Physician Assistant

## 2018-08-16 ENCOUNTER — Other Ambulatory Visit: Payer: Self-pay

## 2018-08-16 ENCOUNTER — Encounter (INDEPENDENT_AMBULATORY_CARE_PROVIDER_SITE_OTHER): Payer: Self-pay | Admitting: Physician Assistant

## 2018-08-16 VITALS — BP 119/81 | HR 97 | Temp 98.1°F | Ht 64.0 in | Wt 257.0 lb

## 2018-08-16 DIAGNOSIS — Z6841 Body Mass Index (BMI) 40.0 and over, adult: Secondary | ICD-10-CM

## 2018-08-16 DIAGNOSIS — Z9189 Other specified personal risk factors, not elsewhere classified: Secondary | ICD-10-CM

## 2018-08-16 DIAGNOSIS — E7849 Other hyperlipidemia: Secondary | ICD-10-CM | POA: Diagnosis not present

## 2018-08-16 DIAGNOSIS — E1165 Type 2 diabetes mellitus with hyperglycemia: Secondary | ICD-10-CM | POA: Diagnosis not present

## 2018-08-16 MED ORDER — VICTOZA 18 MG/3ML ~~LOC~~ SOPN: PEN_INJECTOR | SUBCUTANEOUS | 0 refills | Status: DC

## 2018-08-20 MED FILL — PROPRANOLOL 20 MG TABLET: 20 | 90 days supply | Qty: 90 | Fill #1

## 2018-08-20 MED FILL — AMLODIPINE BESYLATE 5 MG TA: 5 | 90 days supply | Qty: 90 | Fill #1

## 2018-08-20 MED FILL — LOSARTAN POTASSIUM 50 MG TA: 50 | 90 days supply | Qty: 90 | Fill #1

## 2018-08-20 MED FILL — metFORMIN HCL ER 500 MG TB2: 500 | 90 days supply | Qty: 180 | Fill #0

## 2018-08-20 MED FILL — MONTELUKAST SOD 10 MG TAB: 10 | 90 days supply | Qty: 90 | Fill #1

## 2018-08-20 NOTE — Progress Notes (Signed)
Office: [phone]  /  Fax: [phone]   HPI:   Chief Complaint: OBESITY Veronica Russell is here to discuss her progress with her obesity treatment plan. She is on the Category 2 plan and is following her eating plan approximately 65 % of the time. She states she is exercising 0 minutes 0 times per week. Veronica Russell reports that she is not getting in all of her protein. She is eating breakfast and lunch at work, but she struggles with dinner. Her weight is 257 lb (116.6 kg) today and she has maintained weight since her last visit. She has lost 2 lbs since starting treatment with [name].  Diabetes II with hyperglycemia, without long term use of insulin Veronica Russell has a diagnosis of diabetes type II. She is on Metformin, Victoza and Glipizide. Veronica Russell states fasting BGs range between 104 and 140 and she denies any hypoglycemic episodes,.nausea, vomiting or diarrhea. Last A1c was at 6.8 She has been working on intensive lifestyle modifications including diet, exercise, and weight loss to help control her blood glucose levels.  Hyperlipidemia Veronica Russell has hyperlipidemia and she is on Atorvastatin. She has been trying to improve her cholesterol levels with intensive lifestyle modification including a low saturated fat diet, exercise and weight loss. She denies any chest pain.  At risk for cardiovascular disease Veronica Russell is at a higher than average risk for cardiovascular disease due to obesity and hyperlipidemia. She currently denies any chest pain.  ASSESSMENT AND PLAN:  Other hyperlipidemia  Type 2 diabetes mellitus with hyperglycemia, without long-term current use of insulin (HCC) - Plan: liraglutide (VICTOZA) 18 MG/3ML SOPN  At risk for heart disease  Class 3 severe obesity with serious comorbidity and body mass index (BMI) of 40.0 to 44.9 in adult, unspecified obesity type ([place])  PLAN:  Diabetes II with hyperglycemia, without long term use of insulin Veronica Russell has been given extensive diabetes education by myself  today including ideal fasting and post-prandial blood glucose readings, individual ideal Hgb A1c goals and hypoglycemia prevention. We discussed the importance of good blood sugar control to decrease the likelihood of diabetic complications such as nephropathy, neuropathy, limb loss, blindness, coronary artery disease, and death. We discussed the importance of intensive lifestyle modification including diet, exercise and weight loss as the first line treatment for diabetes. Veronica Russell agrees to continue Victoza 1.8 mg every morning #3 pens with no refills and follow up at the agreed upon time.  Hyperlipidemia Veronica Russell was informed of the American Heart Association Guidelines emphasizing intensive lifestyle modifications as the first line treatment for hyperlipidemia. We discussed many lifestyle modifications today in depth, and Veronica Russell will continue to work on decreasing saturated fats such as fatty red meat, butter and many fried foods. She will also increase vegetables and lean protein in her diet and continue to work on exercise and weight loss efforts. Veronica Russell will continue with medications and she will follow up at the agreed upon time.  Cardiovascular risk counseling Veronica Russell was given extended (15 minutes) coronary artery disease prevention counseling today. She is 58 y.o. female and has risk factors for heart disease including obesity and hyperlipidemia. We discussed intensive lifestyle modifications today with an emphasis on specific weight loss instructions and strategies. Pt was also informed of the importance of increasing exercise and decreasing saturated fats to help prevent heart disease.  Obesity Veronica Russell is currently in the action stage of change. As such, her goal is to continue with weight loss efforts She has agreed to follow the [place] eating plan Veronica Russell has  been instructed to work up to a goal of 150 minutes of combined cardio and strengthening exercise per week for weight loss and overall health  benefits. We discussed the following Behavioral Modification Strategies today: decreasing simple carbohydrates  and work on meal planning and easy cooking plans  Veronica Russell has agreed to follow up with our clinic in 3 weeks. She was informed of the importance of frequent follow up visits to maximize her success with intensive lifestyle modifications for her multiple health conditions.  ALLERGIES: Allergies  Allergen Reactions   Erythromycin Nausea Only   Milk-Related Compounds Other (See Comments)    Lactose intollerance    MEDICATIONS: Current Outpatient Medications on File Prior to Visit  Medication Sig Dispense Refill   albuterol (VENTOLIN HFA) 108 (90 Base) MCG/ACT inhaler Inhale 2 puffs into the lungs every 6 (six) hours as needed for wheezing or shortness of breath. 1 Inhaler 0   Alcohol Swabs (ALCOHOL WIPES) 70 % PADS 30 Packages by Does not apply route 2 (two) times daily. 100 each 0   amLODipine (NORVASC) 5 MG tablet Take 5 mg by mouth daily.     atorvastatin (LIPITOR) 20 MG tablet Take 20 mg by mouth daily.     Cholecalciferol 4000 units CAPS Take 4,000 Units by mouth daily.      glipiZIDE (GLUCOTROL) 5 MG tablet Take 1 tablet (5 mg total) by mouth daily before supper. 90 tablet 3   glucose blood (ACCU-CHEK GUIDE) test strip Use twice daily 100 each prn   hydrochlorothiazide (HYDRODIURIL) 25 MG tablet Take 25 mg by mouth every morning.  1   Insulin Pen Needle (BD PEN NEEDLE NANO U/F) 32G X 4 MM MISC 1 Package by Does not apply route daily. 100 each prn   Lancets (ACCU-CHEK SAFE-T PRO) lancets Use as instructed 60 each 0   losartan (COZAAR) 50 MG tablet Take 50 mg by mouth daily.      Melatonin 3 MG TABS Take 1 tablet by mouth at bedtime as needed.     metFORMIN (GLUCOPHAGE-XR) 500 MG 24 hr tablet Take 2 tablets (1,000 mg total) by mouth at bedtime. 180 tablet 3   montelukast (SINGULAIR) 10 MG tablet Take 10 mg by mouth at bedtime.     propranolol (INDERAL) 20 MG  tablet Take 20 mg by mouth daily.      No current facility-administered medications on file prior to visit.     PAST MEDICAL HISTORY: Past Medical History:  Diagnosis Date   Alopecia    Asthma    Back pain    Diabetes (HCC)    Diabetes mellitus (HCC)    GERD (gastroesophageal reflux disease)    Hyperlipidemia    Hypertension    Joint pain    Lactose intolerance    Obesity    Raynauds syndrome    Swelling    Tremor    Vitamin D deficiency     PAST SURGICAL HISTORY: Past Surgical History:  Procedure Laterality Date   ABDOMINAL HYSTERECTOMY     BREAST BIOPSY     MYOMECTOMY      SOCIAL HISTORY: Social History   Tobacco Use   Smoking status: Never Smoker   Smokeless tobacco: Never Used  Substance Use Topics   Alcohol use: No   Drug use: No    FAMILY HISTORY: Family History  Problem Relation Age of Onset   Multiple myeloma Mother    Hypertension Mother    Hyperlipidemia Mother    Cancer Mother  Obesity Mother    Hypertension Father    Hyperlipidemia Father    Cancer Father    Asthma Other    Hyperlipidemia Other    Hypertension Other    Cancer Other    COPD Other    Stroke Other    Breast cancer Maternal Aunt    Breast cancer Paternal Aunt    Breast cancer Maternal Aunt     ROS: Review of Systems  Constitutional: Negative for weight loss.  Cardiovascular: Negative for chest pain.  Gastrointestinal: Negative for diarrhea, nausea and vomiting.  Endo/Heme/Allergies:       Negative for hypoglycemia    PHYSICAL EXAM: Blood pressure 119/81, pulse 97, temperature 98.1 F (36.7 C), temperature source Oral, height '5\' 4"'$  (1.626 m), weight 257 lb (116.6 kg), last menstrual period 08/31/2008, SpO2 98 %. Body mass index is 44.11 kg/m. Physical Exam Vitals signs reviewed.  Constitutional:      Appearance: Normal appearance. She is well-developed. She is obese.  Cardiovascular:     Rate and Rhythm: Normal rate.    Pulmonary:     Effort: Pulmonary effort is normal.  Musculoskeletal: Normal range of motion.  Skin:    General: Skin is warm and dry.  Neurological:     Mental Status: She is alert and oriented to person, place, and time.  Psychiatric:        Mood and Affect: Mood normal.        Behavior: Behavior normal.     RECENT LABS AND TESTS: BMET    Component Value Date/Time   NA 144 07/19/2018 1048   K 3.7 07/19/2018 1048   CL 100 07/19/2018 1048   CO2 26 07/19/2018 1048   GLUCOSE 122 (H) 07/19/2018 1048   GLUCOSE 161 (H) 03/15/2018 1739   BUN 13 07/19/2018 1048   CREATININE 0.62 07/19/2018 1048   CALCIUM 9.9 07/19/2018 1048   GFRNONAA 100 07/19/2018 1048   GFRAA 115 07/19/2018 1048   Lab Results  Component Value Date   HGBA1C 6.8 (H) 07/19/2018   HGBA1C 7.2 (H) 03/07/2018   HGBA1C 7.0 (A) 01/10/2018   HGBA1C 7.4 (H) 10/26/2017   HGBA1C 7.1 (A) 10/05/2017   Lab Results  Component Value Date   INSULIN 42.1 (H) 07/19/2018   INSULIN 43.1 (H) 03/07/2018   INSULIN 56.5 (H) 10/26/2017   INSULIN 39.0 (H) 09/29/2016   INSULIN 38.5 (H) 05/09/2016   CBC    Component Value Date/Time   WBC 6.4 03/15/2018 1739   RBC 4.99 03/15/2018 1739   HGB 14.1 03/15/2018 1739   HGB 12.1 01/27/2016 1006   HCT 44.4 03/15/2018 1739   HCT 37.0 01/27/2016 1006   PLT 216 03/15/2018 1739   MCV 89.0 03/15/2018 1739   MCV 86 01/27/2016 1006   MCH 28.3 03/15/2018 1739   MCHC 31.8 03/15/2018 1739   RDW 13.4 03/15/2018 1739   RDW 13.3 01/27/2016 1006   LYMPHSABS 2.0 01/27/2016 1006   EOSABS 0.2 01/27/2016 1006   BASOSABS 0.0 01/27/2016 1006   Iron/TIBC/Ferritin/ %Sat No results found for: IRON, TIBC, FERRITIN, IRONPCTSAT Lipid Panel     Component Value Date/Time   CHOL 173 07/19/2018 1048   TRIG 144 07/19/2018 1048   HDL 54 07/19/2018 1048   LDLCALC 90 07/19/2018 1048   Hepatic Function Panel     Component Value Date/Time   PROT 6.8 07/19/2018 1048   ALBUMIN 4.1 07/19/2018 1048    AST 17 07/19/2018 1048   ALT 32 07/19/2018 1048  ALKPHOS 92 07/19/2018 1048   BILITOT 0.4 07/19/2018 1048      Component Value Date/Time   TSH 0.50 10/05/2017 0956   TSH 0.52 04/04/2017 0859   TSH 0.436 (L) 09/29/2016 0937     Ref. Range 07/19/2018 10:48  Vitamin D, 25-Hydroxy Latest Ref Range: 30.0 - 100.0 ng/mL 55.4    OBESITY BEHAVIORAL INTERVENTION VISIT  Today's visit was # 49  Starting weight: 259 lbs Starting date: 01/27/2016 Today's weight : 257 lbs Today's date: 08/16/2018 Total lbs lost to date: 2    08/16/2018  Height '5\' 4"'$  (1.626 m)  Weight 257 lb (116.6 kg)  BMI (Calculated) 44.09  BLOOD PRESSURE - SYSTOLIC 350  BLOOD PRESSURE - DIASTOLIC 81   Body Fat % 09.3 %  Total Body Water (lbs) 86.8 lbs    ASK: We discussed the diagnosis of obesity with Veronica Russell today and Veronica Russell agreed to give [name] permission to discuss obesity behavioral modification therapy today.  ASSESS: Veronica Russell has the diagnosis of obesity and her BMI today is 44.09 Veronica Russell is in the action stage of change   ADVISE: Veronica Russell was educated on the multiple health risks of obesity as well as the benefit of weight loss to improve her health. She was advised of the need for long term treatment and the importance of lifestyle modifications to improve her current health and to decrease her risk of future health problems.  AGREE: Multiple dietary modification options and treatment options were discussed and  Maryjane agreed to follow the recommendations documented in the above note.  ARRANGE: Veronica Russell was educated on the importance of frequent visits to treat obesity as outlined per CMS and USPSTF guidelines and agreed to schedule her next follow up appointment today.  Veronica Russell, am acting as transcriptionist for Veronica Potash, PA-C I, Veronica Potash, PA-C have reviewed above note and agree with its content

## 2018-09-06 ENCOUNTER — Ambulatory Visit (INDEPENDENT_AMBULATORY_CARE_PROVIDER_SITE_OTHER): Payer: 59 | Admitting: Physician Assistant

## 2018-09-06 ENCOUNTER — Other Ambulatory Visit: Payer: Self-pay

## 2018-09-06 ENCOUNTER — Encounter (INDEPENDENT_AMBULATORY_CARE_PROVIDER_SITE_OTHER): Payer: Self-pay | Admitting: Physician Assistant

## 2018-09-06 VITALS — BP 108/70 | HR 107 | Temp 98.5°F | Ht 64.0 in | Wt 258.0 lb

## 2018-09-06 DIAGNOSIS — Z6841 Body Mass Index (BMI) 40.0 and over, adult: Secondary | ICD-10-CM

## 2018-09-06 DIAGNOSIS — E119 Type 2 diabetes mellitus without complications: Secondary | ICD-10-CM

## 2018-09-10 MED FILL — VICTOZA 18 MG/3 ML INJECT P: 18 | 30 days supply | Qty: 9 | Fill #2

## 2018-09-10 MED FILL — HYDROCHLOROTHIAZIDE 25 MG T: 25 | 90 days supply | Qty: 90 | Fill #1

## 2018-09-10 NOTE — Progress Notes (Signed)
Office: [phone]  /  Fax: [phone]   HPI:   Chief Complaint: OBESITY Veronica Russell is here to discuss her progress with her obesity treatment plan. She is on the Category 2 plan and is following her eating plan approximately 60% of the time. She states she is exercising 0 minutes 0 times per week. Veronica Russell reports that she is not getting enough protein in at dinner and states she is eating in the cafe at work for lunch. Her weight is 258 lb (117 kg) today and has had a weight gain of 1 lb since her last visit. She has lost 1 lb since starting treatment with [name].  Diabetes II Veronica Russell has a diagnosis of diabetes type II and is on Victoza and metformin. Veronica Russell states fasting blood sugars range between 127 and 170. Last A1c was 6.8 on 07/19/2018. She has been working on intensive lifestyle modifications including diet, exercise, and weight loss to help control her blood glucose levels. No hypoglycemia or polyphagia.  ASSESSMENT AND PLAN:  Type 2 diabetes mellitus without complication, without long-term current use of insulin (HCC)  Class 3 severe obesity with serious comorbidity and body mass index (BMI) of 40.0 to 44.9 in adult, unspecified obesity type ([place])  PLAN:  Diabetes II Veronica Russell has been given extensive diabetes education by myself today including ideal fasting and post-prandial blood glucose readings, individual ideal HgA1c goals  and hypoglycemia prevention. We discussed the importance of good blood sugar control to decrease the likelihood of diabetic complications such as nephropathy, neuropathy, limb loss, blindness, coronary artery disease, and death. We discussed the importance of intensive lifestyle modification including diet, exercise and weight loss as the first line treatment for diabetes. Veronica Russell agrees to continue her diabetes medications and will follow-up at the agreed upon time.  I spent > than 50% of the 15 minute visit on counseling as documented in the note.  Obesity Veronica Russell  is currently in the action stage of change. As such, her goal is to continue with weight loss efforts. She has agreed to follow the Category 2 plan. Veronica Russell will have IC performed at her next visit. Veronica Russell has been instructed to work up to a goal of 150 minutes of combined cardio and strengthening exercise per week for weight loss and overall health benefits. We discussed the following Behavioral Modification Strategies today: work on meal planning and easy cooking plans, and keeping healthy foods in the home.  Veronica Russell has agreed to follow-up with our clinic in 3-4 weeks. She was informed of the importance of frequent follow-up visits to maximize her success with intensive lifestyle modifications for her multiple health conditions.  ALLERGIES: Allergies  Allergen Reactions   Erythromycin Nausea Only   Milk-Related Compounds Other (See Comments)    Lactose intollerance    MEDICATIONS: Current Outpatient Medications on File Prior to Visit  Medication Sig Dispense Refill   albuterol (VENTOLIN HFA) 108 (90 Base) MCG/ACT inhaler Inhale 2 puffs into the lungs every 6 (six) hours as needed for wheezing or shortness of breath. 1 Inhaler 0   Alcohol Swabs (ALCOHOL WIPES) 70 % PADS 30 Packages by Does not apply route 2 (two) times daily. 100 each 0   amLODipine (NORVASC) 5 MG tablet Take 5 mg by mouth daily.     atorvastatin (LIPITOR) 20 MG tablet Take 20 mg by mouth daily.     Cholecalciferol 4000 units CAPS Take 4,000 Units by mouth daily.      glipiZIDE (GLUCOTROL) 5 MG tablet Take 1  tablet (5 mg total) by mouth daily before supper. 90 tablet 3   glucose blood (ACCU-CHEK GUIDE) test strip Use twice daily 100 each prn   hydrochlorothiazide (HYDRODIURIL) 25 MG tablet Take 25 mg by mouth every morning.  1   Insulin Pen Needle (BD PEN NEEDLE NANO U/F) 32G X 4 MM MISC 1 Package by Does not apply route daily. 100 each prn   Lancets (ACCU-CHEK SAFE-T PRO) lancets Use as instructed 60 each 0    liraglutide (VICTOZA) 18 MG/3ML SOPN INJECT 1.8 MGS INTO THE SKIN EVERY MORNING. 9 mL 0   losartan (COZAAR) 50 MG tablet Take 50 mg by mouth daily.      Melatonin 3 MG TABS Take 1 tablet by mouth at bedtime as needed.     metFORMIN (GLUCOPHAGE-XR) 500 MG 24 hr tablet Take 2 tablets (1,000 mg total) by mouth at bedtime. 180 tablet 3   montelukast (SINGULAIR) 10 MG tablet Take 10 mg by mouth at bedtime.     propranolol (INDERAL) 20 MG tablet Take 20 mg by mouth daily.      No current facility-administered medications on file prior to visit.     PAST MEDICAL HISTORY: Past Medical History:  Diagnosis Date   Alopecia    Asthma    Back pain    Diabetes (HCC)    Diabetes mellitus (HCC)    GERD (gastroesophageal reflux disease)    Hyperlipidemia    Hypertension    Joint pain    Lactose intolerance    Obesity    Raynauds syndrome    Swelling    Tremor    Vitamin D deficiency     PAST SURGICAL HISTORY: Past Surgical History:  Procedure Laterality Date   ABDOMINAL HYSTERECTOMY     BREAST BIOPSY     MYOMECTOMY      SOCIAL HISTORY: Social History   Tobacco Use   Smoking status: Never Smoker   Smokeless tobacco: Never Used  Substance Use Topics   Alcohol use: No   Drug use: No    FAMILY HISTORY: Family History  Problem Relation Age of Onset   Multiple myeloma Mother    Hypertension Mother    Hyperlipidemia Mother    Cancer Mother    Obesity Mother    Hypertension Father    Hyperlipidemia Father    Cancer Father    Asthma Other    Hyperlipidemia Other    Hypertension Other    Cancer Other    COPD Other    Stroke Other    Breast cancer Maternal Aunt    Breast cancer Paternal Aunt    Breast cancer Maternal Aunt    ROS: Review of Systems  Endo/Heme/Allergies:       Negative for hypoglycemia. Negative for polyphagia.   PHYSICAL EXAM: Blood pressure 108/70, pulse (!) 107, temperature 98.5 F (36.9 C), temperature  source Oral, height 5' 4" (1.626 m), weight 258 lb (117 kg), last menstrual period 08/31/2008, SpO2 97 %. Body mass index is 44.29 kg/m. Physical Exam Vitals signs reviewed.  Constitutional:      Appearance: Normal appearance. She is obese.  Cardiovascular:     Rate and Rhythm: Normal rate.     Pulses: Normal pulses.  Pulmonary:     Effort: Pulmonary effort is normal.     Breath sounds: Normal breath sounds.  Musculoskeletal: Normal range of motion.  Skin:    General: Skin is warm and dry.  Neurological:     Mental Status: She is  alert and oriented to person, place, and time.  Psychiatric:        Behavior: Behavior normal.   RECENT LABS AND TESTS: BMET    Component Value Date/Time   NA 144 07/19/2018 1048   K 3.7 07/19/2018 1048   CL 100 07/19/2018 1048   CO2 26 07/19/2018 1048   GLUCOSE 122 (H) 07/19/2018 1048   GLUCOSE 161 (H) 03/15/2018 1739   BUN 13 07/19/2018 1048   CREATININE 0.62 07/19/2018 1048   CALCIUM 9.9 07/19/2018 1048   GFRNONAA 100 07/19/2018 1048   GFRAA 115 07/19/2018 1048   Lab Results  Component Value Date   HGBA1C 6.8 (H) 07/19/2018   HGBA1C 7.2 (H) 03/07/2018   HGBA1C 7.0 (A) 01/10/2018   HGBA1C 7.4 (H) 10/26/2017   HGBA1C 7.1 (A) 10/05/2017   Lab Results  Component Value Date   INSULIN 42.1 (H) 07/19/2018   INSULIN 43.1 (H) 03/07/2018   INSULIN 56.5 (H) 10/26/2017   INSULIN 39.0 (H) 09/29/2016   INSULIN 38.5 (H) 05/09/2016   CBC    Component Value Date/Time   WBC 6.4 03/15/2018 1739   RBC 4.99 03/15/2018 1739   HGB 14.1 03/15/2018 1739   HGB 12.1 01/27/2016 1006   HCT 44.4 03/15/2018 1739   HCT 37.0 01/27/2016 1006   PLT 216 03/15/2018 1739   MCV 89.0 03/15/2018 1739   MCV 86 01/27/2016 1006   MCH 28.3 03/15/2018 1739   MCHC 31.8 03/15/2018 1739   RDW 13.4 03/15/2018 1739   RDW 13.3 01/27/2016 1006   LYMPHSABS 2.0 01/27/2016 1006   EOSABS 0.2 01/27/2016 1006   BASOSABS 0.0 01/27/2016 1006   Iron/TIBC/Ferritin/ %Sat No  results found for: IRON, TIBC, FERRITIN, IRONPCTSAT Lipid Panel     Component Value Date/Time   CHOL 173 07/19/2018 1048   TRIG 144 07/19/2018 1048   HDL 54 07/19/2018 1048   LDLCALC 90 07/19/2018 1048   Hepatic Function Panel     Component Value Date/Time   PROT 6.8 07/19/2018 1048   ALBUMIN 4.1 07/19/2018 1048   AST 17 07/19/2018 1048   ALT 32 07/19/2018 1048   ALKPHOS 92 07/19/2018 1048   BILITOT 0.4 07/19/2018 1048      Component Value Date/Time   TSH 0.50 10/05/2017 0956   TSH 0.52 04/04/2017 0859   TSH 0.436 (L) 09/29/2016 0937   Results for MASSA, PE (MRN [id number]) as of 09/10/2018 13:51  Ref. Range 07/19/2018 10:48  Vitamin D, 25-Hydroxy Latest Ref Range: 30.0 - 100.0 ng/mL 55.4   OBESITY BEHAVIORAL INTERVENTION VISIT  Today's visit was #48   Starting weight: 259 lbs Starting date: 01/27/2016 Today's weight: 258 lbs  Today's date: 09/06/2018 Total lbs lost to date: 1    09/06/2018  Height 5' 4" (1.626 m)  Weight 258 lb (117 kg)  BMI (Calculated) 44.26  BLOOD PRESSURE - SYSTOLIC 466  BLOOD PRESSURE - DIASTOLIC 70   Body Fat % 54 %  Total Body Water (lbs) 91.2 lbs   ASK: We discussed the diagnosis of obesity with Veronica Russell today and [place] agreed to give [name] permission to discuss obesity behavioral modification therapy today.  ASSESS: Veronica Russell has the diagnosis of obesity and her BMI today is 44.4. Veronica Russell is in the action stage of change.   ADVISE: Veronica Russell was educated on the multiple health risks of obesity as well as the benefit of weight loss to improve her health. She was advised of the need for long term treatment and the  importance of lifestyle modifications to improve her current health and to decrease her risk of future health problems.  AGREE: Multiple dietary modification options and treatment options were discussed and  Veronica Russell agreed to follow the recommendations documented in the above note.  ARRANGE: Veronica Russell was educated on the  importance of frequent visits to treat obesity as outlined per CMS and USPSTF guidelines and agreed to schedule her next follow up appointment today.  M[place] Veronica Russell, am acting as transcriptionist for Abby Potash, PA-C I, Abby Potash, PA-C have reviewed above note and agree with its content

## 2018-09-12 DIAGNOSIS — E119 Type 2 diabetes mellitus without complications: Secondary | ICD-10-CM | POA: Diagnosis not present

## 2018-09-12 DIAGNOSIS — H5213 Myopia, bilateral: Secondary | ICD-10-CM | POA: Diagnosis not present

## 2018-09-18 ENCOUNTER — Other Ambulatory Visit: Payer: Self-pay

## 2018-09-20 ENCOUNTER — Ambulatory Visit (INDEPENDENT_AMBULATORY_CARE_PROVIDER_SITE_OTHER): Payer: 59 | Admitting: Internal Medicine

## 2018-09-20 ENCOUNTER — Other Ambulatory Visit: Payer: Self-pay

## 2018-09-20 ENCOUNTER — Encounter: Payer: Self-pay | Admitting: Internal Medicine

## 2018-09-20 VITALS — BP 140/90 | HR 96 | Ht 64.0 in | Wt 264.0 lb

## 2018-09-20 DIAGNOSIS — E049 Nontoxic goiter, unspecified: Secondary | ICD-10-CM

## 2018-09-20 DIAGNOSIS — E0590 Thyrotoxicosis, unspecified without thyrotoxic crisis or storm: Secondary | ICD-10-CM | POA: Diagnosis not present

## 2018-09-20 DIAGNOSIS — E042 Nontoxic multinodular goiter: Secondary | ICD-10-CM | POA: Diagnosis not present

## 2018-09-20 DIAGNOSIS — E1165 Type 2 diabetes mellitus with hyperglycemia: Secondary | ICD-10-CM

## 2018-09-20 LAB — POCT GLYCOSYLATED HEMOGLOBIN (HGB A1C): Hemoglobin A1C: 7.1 % — AB (ref 4.0–5.6)

## 2018-09-20 LAB — T4, FREE: Free T4: 0.8 ng/dL (ref 0.60–1.60)

## 2018-09-20 LAB — TSH: TSH: 0.63 u[IU]/mL (ref 0.35–4.50)

## 2018-09-20 LAB — T3, FREE: T3, Free: 3.4 pg/mL (ref 2.3–4.2)

## 2018-09-20 MED ORDER — VICTOZA 18 MG/3ML ~~LOC~~ SOPN: PEN_INJECTOR | SUBCUTANEOUS | 3 refills | Status: DC

## 2018-09-20 MED ORDER — GLIPIZIDE 5 MG PO TABS: 5.0000 mg | ORAL_TABLET | Freq: Every day | ORAL | 3 refills | Status: DC

## 2018-09-20 MED ORDER — METFORMIN HCL ER 500 MG PO TB24: 1000.0000 mg | ORAL_TABLET | Freq: Every day | ORAL | 3 refills | Status: DC

## 2018-09-20 NOTE — Progress Notes (Signed)
Patient ID: Veronica Russell, female   DOB: [birth year], 58 y.o.   MRN: [id number]    HPI  Veronica Russell is a 58 y.o.-year-old female, returning for follow-up for h/o subclinical hyperthyroidism, left thyroid nodule, and DM2, non-insulin-dependent.  Last visit 4 months ago (virtual).  Subclinical hyperthyroidism:  Patient was found to have a slightly low TSH at her first visit in the weight loss clinic in 01/2016.  Her TFTs normalized in the last 2 years.  Review TFTs: Lab Results  Component Value Date   TSH 0.50 10/05/2017   TSH 0.52 04/04/2017   TSH 0.436 (L) 09/29/2016   TSH 0.443 (L) 05/09/2016   TSH 0.425 (L) 01/27/2016   FREET4 0.75 10/05/2017   FREET4 0.75 04/04/2017   FREET4 1.17 09/29/2016   FREET4 1.24 05/09/2016   FREET4 1.17 01/27/2016    Lab Results  Component Value Date   T3FREE 3.9 10/05/2017   T3FREE 3.7 04/04/2017    She continues to experience:  Hot flashes  Chronic tremors (hereditary)  Palpitations with exertion  Hair loss  Pt does have a FH of thyroid ds. - M aunt. No FH of thyroid cancer. No h/o radiation tx to head or neck.  No seaweed or kelp. No recent contrast studies. No herbal supplements. No Biotin use. No recent steroids use.   Left thyroid nodule:  Reviewed previous studies: 04/14/2017: 2.9 x 2 x 2.7 solid, hypoechoic, left thyroid nodule 05/11/2017: FNA of the left thyroid nodule: Benign  Pt denies: - feeling nodules in neck - hoarseness - dysphagia - choking - SOB with lying down  DM2:  Reviewed HbA1c levels: Lab Results  Component Value Date   HGBA1C 6.8 (H) 07/19/2018   HGBA1C 7.2 (H) 03/07/2018   HGBA1C 7.0 (A) 01/10/2018   HGBA1C 7.4 (H) 10/26/2017   HGBA1C 7.1 (A) 10/05/2017   HGBA1C 7.8 05/10/2017   HGBA1C 6.6 (H) 09/29/2016   HGBA1C 6.5 (H) 05/09/2016   HGBA1C 6.7 (H) 01/27/2016   HGBA1C 6.9 01/12/2016   She is on: - Metformin ER 1000 mg with dinner - Victoza 1.2 mg + 5 clicks in a.m. >> 1.8 mg daily (no GI  side effects) - Glipizide 5 mg before dinner  She checks her sugars twice a day - higher last week (her b'day): - am: 138-166, 171 >> 150s >> 130-140 after increasing Victoza >> 138-162 - 2h after b'fast: n/c - lunch: n/c >> 139 >> n/c - 2h after lunch: n/c - dinner: n/c >> 125-177 >> 105, 115, 125 >> 89, 106. 115 - 2h after dinner: 114-193, 224, 238 >> 160-180 >> 120-235, 240 - bedtime: n/c Lowest: 105 >> 89; hypoglycemia awareness in the 70s. Highest: 390 (40 mg Prednisone) >> 240 (cake)  Meter: Accu-Chek  No CKD: Lab Results  Component Value Date   BUN 13 07/19/2018   Lab Results  Component Value Date   CREATININE 0.62 07/19/2018  On losartan.  + HL: Lab Results  Component Value Date   CHOL 173 07/19/2018   HDL 54 07/19/2018   LDLCALC 90 07/19/2018   TRIG 144 07/19/2018  On Lipitor 20.  Last eye exam: 08/2017: No DR, + glaucoma  She also has HTN, asthma, vitamin D deficiency.  She has a history of hair loss and is seeing Dr. Lois Huxley, dermatologist at [facility].  She specializes in hair loss.  ROS: Constitutional: + weight gain/no weight loss, no fatigue, no subjective hyperthermia, no subjective hypothermia Eyes: no blurry vision, no xerophthalmia  ENT: no sore throat, + see HPI Cardiovascular: no CP/no SOB/no palpitations/no leg swelling Respiratory: no cough/no SOB/no wheezing Gastrointestinal: no N/no V/no D/no C/no acid reflux Musculoskeletal: no muscle aches/no joint aches Skin: no rashes, no hair loss Neurological: no tremors/no numbness/no tingling/no dizziness  I reviewed pt's medications, allergies, PMH, social hx, family hx, and changes were documented in the history of present illness. Otherwise, unchanged from my initial visit note.   Past Medical History:  Diagnosis Date  . Alopecia   . Asthma   . Back pain   . Diabetes ([place])   . Diabetes mellitus ([place])   . GERD (gastroesophageal reflux disease)   . Hyperlipidemia   . Hypertension    . Joint pain   . Lactose intolerance   . Obesity   . Raynauds syndrome   . Swelling   . Tremor   . Vitamin D deficiency    Past Surgical History:  Procedure Laterality Date  . ABDOMINAL HYSTERECTOMY    . BREAST BIOPSY    . MYOMECTOMY     Social History   Socioeconomic History  . Marital status: Single    Spouse name: Not on file  . Number of children: no  Social Needs  Occupational History  . Occupation: [employer]    Employer: [employer]  Tobacco Use  . Smoking status: Never Smoker  . Smokeless tobacco: Never Used  Substance and Sexual Activity  . Alcohol use:  Wine, 2-3 times a year, 1 drink  . Drug use: No   Current Outpatient Medications on File Prior to Visit  Medication Sig Dispense Refill  . albuterol (VENTOLIN HFA) 108 (90 Base) MCG/ACT inhaler Inhale 2 puffs into the lungs every 6 (six) hours as needed for wheezing or shortness of breath. 1 Inhaler 0  . Alcohol Swabs (ALCOHOL WIPES) 70 % PADS 30 Packages by Does not apply route 2 (two) times daily. 100 each 0  . amLODipine (NORVASC) 5 MG tablet Take 5 mg by mouth daily.    Marland Kitchen atorvastatin (LIPITOR) 20 MG tablet Take 20 mg by mouth daily.    . Cholecalciferol 4000 units CAPS Take 4,000 Units by mouth daily.     Marland Kitchen glipiZIDE (GLUCOTROL) 5 MG tablet Take 1 tablet (5 mg total) by mouth daily before supper. 90 tablet 3  . glucose blood (ACCU-CHEK GUIDE) test strip Use twice daily 100 each prn  . hydrochlorothiazide (HYDRODIURIL) 25 MG tablet Take 25 mg by mouth every morning.  1  . Insulin Pen Needle (BD PEN NEEDLE NANO U/F) 32G X 4 MM MISC 1 Package by Does not apply route daily. 100 each prn  . Lancets (ACCU-CHEK SAFE-T PRO) lancets Use as instructed 60 each 0  . liraglutide (VICTOZA) 18 MG/3ML SOPN INJECT 1.8 MGS INTO THE SKIN EVERY MORNING. 9 mL 0  . losartan (COZAAR) 50 MG tablet Take 50 mg by mouth daily.     . Melatonin 3 MG TABS Take 1 tablet by mouth at bedtime as needed.    . metFORMIN  (GLUCOPHAGE-XR) 500 MG 24 hr tablet Take 2 tablets (1,000 mg total) by mouth at bedtime. 180 tablet 3  . montelukast (SINGULAIR) 10 MG tablet Take 10 mg by mouth at bedtime.    . propranolol (INDERAL) 20 MG tablet Take 20 mg by mouth daily.      No current facility-administered medications on file prior to visit.    Allergies  Allergen Reactions  . Erythromycin Nausea Only  . Milk-Related Compounds Other (  See Comments)    Lactose intollerance   Family History  Problem Relation Age of Onset  . Multiple myeloma Mother   . Hypertension Mother   . Hyperlipidemia Mother   . Cancer Mother   . Obesity Mother   . Hypertension Father   . Hyperlipidemia Father   . Cancer Father   . Asthma Other   . Hyperlipidemia Other   . Hypertension Other   . Cancer Other   . COPD Other   . Stroke Other   . Breast cancer Maternal Aunt   . Breast cancer Paternal Aunt   . Breast cancer Maternal Aunt     PE: BP 140/90   Pulse 96   Ht 5' 4"  (1.626 m)   Wt 264 lb (119.7 kg)   LMP 08/31/2008   SpO2 96%   BMI 45.32 kg/m  Wt Readings from Last 3 Encounters:  09/20/18 264 lb (119.7 kg)  09/06/18 258 lb (117 kg)  08/16/18 257 lb (116.6 kg)   Constitutional: overweight, in NAD Eyes: PERRLA, EOMI, no exophthalmos ENT: moist mucous membranes, no thyromegaly, no cervical lymphadenopathy Cardiovascular: tachycardia, RR, No MRG Respiratory: CTA B Gastrointestinal: abdomen soft, NT, ND, BS+ Musculoskeletal: no deformities, strength intact in all 4 Skin: moist, warm, no rashes Neurological: no tremor with outstretched hands, DTR normal in all 4  ASSESSMENT: 1.  Mild subclinical thyrotoxicosis  2.  left thyroid nodule Thyroid ultrasound: FINDINGS: Parenchymal Echotexture: Mildly heterogenous Isthmus: 0.5 cm Right lobe: 4.5 x 2.1 x 1.3 cm Left lobe: 4.3 x 1.4 x 1.6 cm _________________________________________________________  Nodule # 1: Location: Right; Inferior Maximum size: 2.9 cm;  Other 2 dimensions: 2.0 x 2.1 cm Composition: solid/almost completely solid (2) Echogenicity: hypoechoic (2) Shape: not taller-than-wide (0) Margins: smooth (0) Echogenic foci: none (0) ACR TI-RADS total points: 4. ACR TI-RADS risk category: TR4 (4-6 points).  ACR TI-RADS recommendations: **Given size (>/= 1.5 cm) and appearance, fine needle aspiration of this moderately suspicious nodule should be considered based on TI-RADS criteria.________________________________________________  Multiple other smaller nodules measure 0.7 cm or less in size and do not meet criteria for biopsy nor follow-up.  IMPRESSION: Right lower pole nodule 1 meets criteria for fine needle aspiration biopsy.  Electronically Signed By: Marybelle Killings M.D. On: 04/14/2017 08:17   FNA: Adequacy Reason Satisfactory For Evaluation. Diagnosis THYROID, FINE NEEDLE ASPIRATION, RLP (SPECIMEN 1 OF 1,COLLECTED 05/11/17): CONSISTENT WITH BENIGN FOLLICULAR NODULE ([place] CATEGORY II). Enid Cutter MD Pathologist, Electronic Signature (Case signed 05/15/2017) Specimen Clinical Information Right Inferior, 2.9cm; Other 2 dimensions: 2.0 x 2.1cm, solid/almost completely solid, hypoechoic, TI-RADS total points - 4, Moderately suspicious nodule Source Thyroid, Fine Needle Aspiration, Right, RLP (Specimen 1 of 1, collected on 05/11/17)  PLAN:  1. Patient with history of a slightly low TSH with normal free thyroid hormones (subclinical thyrotoxicosis), which thyrotoxic symptoms: Hot flashes (however, she is postmenopausal), palpitations with exertion, hair loss.  No weight loss, anxiety, hyper defecation.  Her TFTs normalized at last checks.  Latest TSH was: Lab Results  Component Value Date   TSH 0.50 10/05/2017  -We will recheck her TFTs now and if they start to worsen, we may need a thyroid uptake and scan  2. L thyroid nodule -Initially felt on palpation -Denies neck compression symptoms -I reviewed the report  of her most recent thyroid ultrasound from 03/2017 and this shows a 2.9 cm nodule, hypoechoic, solid.  We biopsied the nodule in 05/2017 and the results were benign -No intervention needed for now but  we need a repeat of her ultrasound at next visit  3. DM2 -Continues on metformin, glipizide, and Victoza -At previous visits, we discussed about the importance of controlling her diet and I made specific suggestions.  I strongly recommended that she stops drinking sugary drinks.  At that time, sugars were in the 1 40-1 50 range, with the exception of occasional hyperglycemic spikes due to dietary indiscretions.  She had late dinners and sugars after dinner were higher than target.  I suggested to move dinner earlier.  I also suggested to start glipizide, low dose, before dinner to help with postprandial hyperglycemia.  Sugars improved after he did so.  Latest HbA1c is from 2 months ago and this was improved to 6.8%. - She is currently seen in the weight management clinic - at this visit, she tells me she had many dietary indiscretions this mo as it is her b'day month (many sweets, eating out). Discussed the need to reduce the desserts. I also advised her to take 2 glipizide tabs if she is eating a larger meal or dessert after dinner. - At today's visit, HbA1c is 7.1% (higher). - I suggested to: Patient Instructions  Please continue: - Metformin ER 1000 mg with dinner - Victoza 1.8  mg daily - Glipizide 5 mg before dinner (but take 2 tablets before a larger dinner)  Please return in 4 months with your sugar log.    - advised to check sugars at different times of the day - 1x a day, rotating check times - advised for yearly eye exams >> she is UTD - return to clinic in 4 months  Office Visit on 09/20/2018  Component Date Value Ref Range Status  . Hemoglobin A1C 09/20/2018 7.1* 4.0 - 5.6 % Final  . TSH 09/20/2018 0.63  0.35 - 4.50 uIU/mL Final  . Free T4 09/20/2018 0.80  0.60 - 1.60 ng/dL Final    Comment: Specimens from patients who are undergoing biotin therapy and /or ingesting biotin supplements may contain high levels of biotin.  The higher biotin concentration in these specimens interferes with this Free T4 assay.  Specimens that contain high levels  of biotin may cause false high results for this Free T4 assay.  Please interpret results in light of the total clinical presentation of the patient.    . T3, Free 09/20/2018 3.4  2.3 - 4.2 pg/mL Final   TFTs normal.  Philemon Kingdom, MD PhD [facility] Endocrinology

## 2018-09-20 NOTE — Patient Instructions (Signed)
Please continue: - Metformin ER 1000 mg with dinner - Victoza 1.8  mg daily - Glipizide 5 mg before dinner (but take 2 tablets before a larger dinner)  Please return in 4 months with your sugar log.

## 2018-09-21 MED FILL — ACCU-CHEK GUIDE TEST STRIP: 50 days supply | Qty: 100 | Fill #1

## 2018-09-27 ENCOUNTER — Ambulatory Visit
Admission: RE | Admit: 2018-09-27 | Discharge: 2018-09-27 | Disposition: A | Discharge status: Home / Self Care | Payer: 59 | Source: Ambulatory Visit | Attending: Family Medicine | Admitting: Family Medicine

## 2018-09-27 ENCOUNTER — Other Ambulatory Visit: Payer: Self-pay

## 2018-09-27 DIAGNOSIS — Z1231 Encounter for screening mammogram for malignant neoplasm of breast: Secondary | ICD-10-CM | POA: Diagnosis not present

## 2018-10-02 ENCOUNTER — Ambulatory Visit (INDEPENDENT_AMBULATORY_CARE_PROVIDER_SITE_OTHER): Payer: 59 | Admitting: Physician Assistant

## 2018-10-02 ENCOUNTER — Other Ambulatory Visit: Payer: Self-pay

## 2018-10-02 ENCOUNTER — Encounter (INDEPENDENT_AMBULATORY_CARE_PROVIDER_SITE_OTHER): Payer: Self-pay | Admitting: Physician Assistant

## 2018-10-02 VITALS — BP 130/84 | HR 99 | Temp 98.4°F | Ht 64.0 in | Wt 260.0 lb

## 2018-10-02 DIAGNOSIS — Z6841 Body Mass Index (BMI) 40.0 and over, adult: Secondary | ICD-10-CM

## 2018-10-02 DIAGNOSIS — Z9189 Other specified personal risk factors, not elsewhere classified: Secondary | ICD-10-CM | POA: Diagnosis not present

## 2018-10-02 DIAGNOSIS — R0602 Shortness of breath: Secondary | ICD-10-CM

## 2018-10-02 DIAGNOSIS — E119 Type 2 diabetes mellitus without complications: Secondary | ICD-10-CM

## 2018-10-03 NOTE — Progress Notes (Signed)
Office: [phone]  /  Fax: [phone]   HPI:   Chief Complaint: OBESITY Veronica Russell is here to discuss her progress with her obesity treatment plan. Veronica Russell is on the Category 2 plan and is following her eating plan approximately 65 % of the time. Veronica Russell states Veronica Russell is exercising 0 minutes 0 times per week. Veronica Russell reports that Veronica Russell had multiple birthday celebrations. Veronica Russell is ready to get back on track. Her weight is 260 lb (117.9 kg) today and has had a weight loss of 2 pounds over a period of 4 weeks since her last visit. Veronica Russell has gained 1 lb since starting treatment with [name].  Diabetes II Veronica Russell has a diagnosis of diabetes type II. Veronica Russell is on Victoza and Metformin currently. Veronica Russell states fasting BGs range between 114 and 159 and Veronica Russell denies  Nausea, vomiting or diarrhea. Last A1c was at 6.8 on 07/19/18. Veronica Russell has been working on intensive lifestyle modifications including diet, exercise, and weight loss to help control her blood glucose levels. Wonder denies polyphagia.  At risk for cardiovascular disease Veronica Russell is at a higher than average risk for cardiovascular disease due to obesity and diabetes. Veronica Russell currently denies any chest pain.  Dyspnea with Exercise Veronica Russell notes increasing shortness of breath with exercising and seems to be worsening over time with weight gain. Veronica Russell notes getting out of breath sooner with activity than Veronica Russell used to. This has not gotten worse recently. Veronica Russell denies dizziness.  ASSESSMENT AND PLAN:  Type 2 diabetes mellitus without complication, without long-term current use of insulin (HCC)  SOB (shortness of breath) on exertion  At risk for heart disease  Class 3 severe obesity with serious comorbidity and body mass index (BMI) of 40.0 to 44.9 in adult, unspecified obesity type ([place])  PLAN:  Diabetes II Tawyna has been given extensive diabetes education by myself today including ideal fasting and post-prandial blood glucose readings, individual ideal Hgb A1c goals and  hypoglycemia prevention. We discussed the importance of good blood sugar control to decrease the likelihood of diabetic complications such as nephropathy, neuropathy, limb loss, blindness, coronary artery disease, and death. We discussed the importance of intensive lifestyle modification including diet, exercise and weight loss as the first line treatment for diabetes. Abbee agrees to continue her diabetes medications and will follow up at the agreed upon time.  Cardiovascular risk counseling Veronica Russell was given extended (15 minutes) coronary artery disease prevention counseling today. Veronica Russell is 58 y.o. female and has risk factors for heart disease including obesity and diabetes. We discussed intensive lifestyle modifications today with an emphasis on specific weight loss instructions and strategies. Pt was also informed of the importance of increasing exercise and decreasing saturated fats to help prevent heart disease.  Dyspnea with exercise Veronica Russell's shortness of breath appears to be obesity related and exercise induced. The indirect calorimeter results showed VO2 of 331 and a REE of 2301 (BMR of 1774). Veronica Russell has agreed to work on weight loss and gradually increase exercise to treat her exercise induced shortness of breath. If Veronica Russell follows our instructions and loses weight without improvement of her shortness of breath, we will plan to refer to pulmonology. Veronica Russell agrees to this plan.  Obesity Veronica Russell is currently in the action stage of change. As such, her goal is to continue with weight loss efforts Veronica Russell has agreed to change to the Category 3 plan Veronica Russell has been instructed to work up to a goal of 150 minutes of combined cardio and strengthening exercise per week for  weight loss and overall health benefits. We discussed the following Behavioral Modification Strategies today: keeping healthy foods in the home and work on meal planning and easy cooking plans  Veronica Russell has agreed to follow up with our clinic in 2  weeks. Veronica Russell was informed of the importance of frequent follow up visits to maximize her success with intensive lifestyle modifications for her multiple health conditions.  ALLERGIES: Allergies  Allergen Reactions   Erythromycin Nausea Only   Milk-Related Compounds Other (See Comments)    Lactose intollerance    MEDICATIONS: Current Outpatient Medications on File Prior to Visit  Medication Sig Dispense Refill   albuterol (VENTOLIN HFA) 108 (90 Base) MCG/ACT inhaler Inhale 2 puffs into the lungs every 6 (six) hours as needed for wheezing or shortness of breath. 1 Inhaler 0   Alcohol Swabs (ALCOHOL WIPES) 70 % PADS 30 Packages by Does not apply route 2 (two) times daily. 100 each 0   amLODipine (NORVASC) 5 MG tablet Take 5 mg by mouth daily.     atorvastatin (LIPITOR) 20 MG tablet Take 20 mg by mouth daily.     Cholecalciferol 4000 units CAPS Take 4,000 Units by mouth daily.      glipiZIDE (GLUCOTROL) 5 MG tablet Take 1-2 tablets (5-10 mg total) by mouth daily before supper. 90 tablet 3   glucose blood (ACCU-CHEK GUIDE) test strip Use twice daily 100 each prn   hydrochlorothiazide (HYDRODIURIL) 25 MG tablet Take 25 mg by mouth every morning.  1   Insulin Pen Needle (BD PEN NEEDLE NANO U/F) 32G X 4 MM MISC 1 Package by Does not apply route daily. 100 each prn   Lancets (ACCU-CHEK SAFE-T PRO) lancets Use as instructed 60 each 0   liraglutide (VICTOZA) 18 MG/3ML SOPN INJECT 1.8 MGS INTO THE SKIN EVERY MORNING. 9 mL 3   losartan (COZAAR) 50 MG tablet Take 50 mg by mouth daily.      Melatonin 3 MG TABS Take 1 tablet by mouth at bedtime as needed.     metFORMIN (GLUCOPHAGE-XR) 500 MG 24 hr tablet Take 2 tablets (1,000 mg total) by mouth at bedtime. 180 tablet 3   montelukast (SINGULAIR) 10 MG tablet Take 10 mg by mouth at bedtime.     propranolol (INDERAL) 20 MG tablet Take 20 mg by mouth daily.      No current facility-administered medications on file prior to visit.      PAST MEDICAL HISTORY: Past Medical History:  Diagnosis Date   Alopecia    Asthma    Back pain    Diabetes (HCC)    Diabetes mellitus (HCC)    GERD (gastroesophageal reflux disease)    Hyperlipidemia    Hypertension    Joint pain    Lactose intolerance    Obesity    Raynauds syndrome    Swelling    Tremor    Vitamin D deficiency     PAST SURGICAL HISTORY: Past Surgical History:  Procedure Laterality Date   ABDOMINAL HYSTERECTOMY     BREAST BIOPSY     MYOMECTOMY      SOCIAL HISTORY: Social History   Tobacco Use   Smoking status: Never Smoker   Smokeless tobacco: Never Used  Substance Use Topics   Alcohol use: No   Drug use: No    FAMILY HISTORY: Family History  Problem Relation Age of Onset   Multiple myeloma Mother    Hypertension Mother    Hyperlipidemia Mother    Cancer Mother  Obesity Mother    Hypertension Father    Hyperlipidemia Father    Cancer Father    Asthma Other    Hyperlipidemia Other    Hypertension Other    Cancer Other    COPD Other    Stroke Other    Breast cancer Maternal Aunt    Breast cancer Paternal Aunt    Breast cancer Maternal Aunt     ROS: Review of Systems  Constitutional: Negative for weight loss.  Respiratory: Positive for shortness of breath (on exertion).   Cardiovascular: Negative for chest pain.  Gastrointestinal: Negative for diarrhea, nausea and vomiting.  Neurological: Negative for dizziness.  Endo/Heme/Allergies:       Negative for polyphagia    PHYSICAL EXAM: Blood pressure 130/84, pulse 99, temperature 98.4 F (36.9 C), temperature source Oral, height 5' 4" (1.626 m), weight 260 lb (117.9 kg), last menstrual period 08/31/2008, SpO2 97 %. Body mass index is 44.63 kg/m. Physical Exam Vitals signs reviewed.  Constitutional:      Appearance: Normal appearance. Veronica Russell is well-developed. Veronica Russell is obese.  Cardiovascular:     Rate and Rhythm: Normal rate.   Pulmonary:     Effort: Pulmonary effort is normal.  Musculoskeletal: Normal range of motion.  Skin:    General: Skin is warm and dry.  Neurological:     Mental Status: Veronica Russell is alert and oriented to person, place, and time.  Psychiatric:        Mood and Affect: Mood normal.        Behavior: Behavior normal.     RECENT LABS AND TESTS: BMET    Component Value Date/Time   NA 144 07/19/2018 1048   K 3.7 07/19/2018 1048   CL 100 07/19/2018 1048   CO2 26 07/19/2018 1048   GLUCOSE 122 (H) 07/19/2018 1048   GLUCOSE 161 (H) 03/15/2018 1739   BUN 13 07/19/2018 1048   CREATININE 0.62 07/19/2018 1048   CALCIUM 9.9 07/19/2018 1048   GFRNONAA 100 07/19/2018 1048   GFRAA 115 07/19/2018 1048   Lab Results  Component Value Date   HGBA1C 7.1 (A) 09/20/2018   HGBA1C 6.8 (H) 07/19/2018   HGBA1C 7.2 (H) 03/07/2018   HGBA1C 7.0 (A) 01/10/2018   HGBA1C 7.4 (H) 10/26/2017   Lab Results  Component Value Date   INSULIN 42.1 (H) 07/19/2018   INSULIN 43.1 (H) 03/07/2018   INSULIN 56.5 (H) 10/26/2017   INSULIN 39.0 (H) 09/29/2016   INSULIN 38.5 (H) 05/09/2016   CBC    Component Value Date/Time   WBC 6.4 03/15/2018 1739   RBC 4.99 03/15/2018 1739   HGB 14.1 03/15/2018 1739   HGB 12.1 01/27/2016 1006   HCT 44.4 03/15/2018 1739   HCT 37.0 01/27/2016 1006   PLT 216 03/15/2018 1739   MCV 89.0 03/15/2018 1739   MCV 86 01/27/2016 1006   MCH 28.3 03/15/2018 1739   MCHC 31.8 03/15/2018 1739   RDW 13.4 03/15/2018 1739   RDW 13.3 01/27/2016 1006   LYMPHSABS 2.0 01/27/2016 1006   EOSABS 0.2 01/27/2016 1006   BASOSABS 0.0 01/27/2016 1006   Iron/TIBC/Ferritin/ %Sat No results found for: IRON, TIBC, FERRITIN, IRONPCTSAT Lipid Panel     Component Value Date/Time   CHOL 173 07/19/2018 1048   TRIG 144 07/19/2018 1048   HDL 54 07/19/2018 1048   LDLCALC 90 07/19/2018 1048   Hepatic Function Panel     Component Value Date/Time   PROT 6.8 07/19/2018 1048   ALBUMIN 4.1 07/19/2018 1048  AST 17 07/19/2018 1048   ALT 32 07/19/2018 1048   ALKPHOS 92 07/19/2018 1048   BILITOT 0.4 07/19/2018 1048      Component Value Date/Time   TSH 0.63 09/20/2018 1049   TSH 0.50 10/05/2017 0956   TSH 0.52 04/04/2017 0859     Ref. Range 07/19/2018 10:48  Vitamin D, 25-Hydroxy Latest Ref Range: 30.0 - 100.0 ng/mL 55.4    OBESITY BEHAVIORAL INTERVENTION VISIT  Today's visit was # 88  Starting weight: 259 lbs Starting date: 01/27/2016 Today's weight : 260 lbs Today's date: 10/02/2018 Total lbs lost to date: 0    10/02/2018  Height 5' 4" (1.626 m)  Weight 260 lb (117.9 kg)  BMI (Calculated) 44.61  BLOOD PRESSURE - SYSTOLIC 413  BLOOD PRESSURE - DIASTOLIC 84   Body Fat % 53 %  Total Body Water (lbs) 86.8 lbs  RMR 2301    ASK: We discussed the diagnosis of obesity with [name] today and Veronica Russell agreed to give [name] permission to discuss obesity behavioral modification therapy today.  ASSESS: Wenonah has the diagnosis of obesity and her BMI today is 44.61 Teka is in the action stage of change   ADVISE: Luwanna was educated on the multiple health risks of obesity as well as the benefit of weight loss to improve her health. Veronica Russell was advised of the need for long term treatment and the importance of lifestyle modifications to improve her current health and to decrease her risk of future health problems.  AGREE: Multiple dietary modification options and treatment options were discussed and  Yarexi agreed to follow the recommendations documented in the above note.  ARRANGE: Sian was educated on the importance of frequent visits to treat obesity as outlined per CMS and USPSTF guidelines and agreed to schedule her next follow up appointment today.  Corey Skains, am acting as transcriptionist for Abby Potash, PA-C I, Abby Potash, PA-C have reviewed above note and agree with its content

## 2018-10-11 MED FILL — VICTOZA 18 MG/3 ML INJECT P: 18 | 30 days supply | Qty: 9 | Fill #2

## 2018-10-11 MED FILL — UNIFINE PENTIPS 32GX5/32: 32G X 4 MM | 90 days supply | Qty: 100 | Fill #2

## 2018-10-16 ENCOUNTER — Other Ambulatory Visit: Payer: Self-pay

## 2018-10-16 ENCOUNTER — Ambulatory Visit (INDEPENDENT_AMBULATORY_CARE_PROVIDER_SITE_OTHER): Payer: 59 | Admitting: Physician Assistant

## 2018-10-16 VITALS — BP 116/77 | HR 103 | Temp 98.2°F | Ht 64.0 in | Wt 261.8 lb

## 2018-10-16 DIAGNOSIS — Z6841 Body Mass Index (BMI) 40.0 and over, adult: Secondary | ICD-10-CM | POA: Diagnosis not present

## 2018-10-16 DIAGNOSIS — E119 Type 2 diabetes mellitus without complications: Secondary | ICD-10-CM | POA: Diagnosis not present

## 2018-10-18 NOTE — Progress Notes (Signed)
Office: [phone]  /  Fax: [phone]   HPI:   Chief Complaint: OBESITY Guiliana is here to discuss her progress with her obesity treatment plan. She is on the Category 3 plan and is following her eating plan approximately 50 % of the time. She states she is exercising 0 minutes 0 times per week. Tearsa reports that she is having a difficult time getting in all of her food on the Category 3 plan. She is going to start working from home in two weeks. Her weight is 261 lb 12.8 oz (118.8 kg) today and has had a weight gain of 1 pound over a period of 2 weeks since her last visit. She has gained 2 lbs since starting treatment with [name].  Diabetes II Spring has a diagnosis of diabetes type II. She is on Metformin and Glipizide. Malayia states fasting BGs range between 140 to 150's and she denies any hypoglycemic episodes. Last A1c was at 7.1 She has been working on intensive lifestyle modifications including diet, exercise, and weight loss to help control her blood glucose levels. Punam denies nausea, vomiting or diarrhea.  ASSESSMENT AND PLAN:  Type 2 diabetes mellitus without complication, without long-term current use of insulin (HCC)  Class 3 severe obesity with serious comorbidity and body mass index (BMI) of 40.0 to 44.9 in adult, unspecified obesity type ([facility])  PLAN:  Diabetes II Kiah has been given extensive diabetes education by myself today including ideal fasting and post-prandial blood glucose readings, individual ideal Hgb A1c goals and hypoglycemia prevention. We discussed the importance of good blood sugar control to decrease the likelihood of diabetic complications such as nephropathy, neuropathy, limb loss, blindness, coronary artery disease, and death. We discussed the importance of intensive lifestyle modification including diet, exercise and weight loss as the first line treatment for diabetes. Chequita agrees to continue with weight loss and medications and she will follow up at  the agreed upon time.  I spent > than 50% of the 15 minute visit on counseling as documented in the note.  Obesity Anacaren is currently in the action stage of change. As such, her goal is to continue with weight loss efforts She has agreed to follow the Category 3 plan Rosio has been instructed to work up to a goal of 150 minutes of combined cardio and strengthening exercise per week for weight loss and overall health benefits. We discussed the following Behavioral Modification Strategies today: keeping healthy foods in the home and work on meal planning and easy cooking plans  Kanyla has agreed to follow up with our clinic in 3 weeks. She was informed of the importance of frequent follow up visits to maximize her success with intensive lifestyle modifications for her multiple health conditions.  ALLERGIES: Allergies  Allergen Reactions   Erythromycin Nausea Only   Milk-Related Compounds Other (See Comments)    Lactose intollerance    MEDICATIONS: Current Outpatient Medications on File Prior to Visit  Medication Sig Dispense Refill   albuterol (VENTOLIN HFA) 108 (90 Base) MCG/ACT inhaler Inhale 2 puffs into the lungs every 6 (six) hours as needed for wheezing or shortness of breath. 1 Inhaler 0   Alcohol Swabs (ALCOHOL WIPES) 70 % PADS 30 Packages by Does not apply route 2 (two) times daily. 100 each 0   amLODipine (NORVASC) 5 MG tablet Take 5 mg by mouth daily.     atorvastatin (LIPITOR) 20 MG tablet Take 20 mg by mouth daily.     Cholecalciferol 4000 units CAPS  Take 4,000 Units by mouth daily.      glipiZIDE (GLUCOTROL) 5 MG tablet Take 1-2 tablets (5-10 mg total) by mouth daily before supper. 90 tablet 3   glucose blood (ACCU-CHEK GUIDE) test strip Use twice daily 100 each prn   hydrochlorothiazide (HYDRODIURIL) 25 MG tablet Take 25 mg by mouth every morning.  1   Insulin Pen Needle (BD PEN NEEDLE NANO U/F) 32G X 4 MM MISC 1 Package by Does not apply route daily. 100 each  prn   Lancets (ACCU-CHEK SAFE-T PRO) lancets Use as instructed 60 each 0   liraglutide (VICTOZA) 18 MG/3ML SOPN INJECT 1.8 MGS INTO THE SKIN EVERY MORNING. 9 mL 3   losartan (COZAAR) 50 MG tablet Take 50 mg by mouth daily.      Melatonin 3 MG TABS Take 1 tablet by mouth at bedtime as needed.     metFORMIN (GLUCOPHAGE-XR) 500 MG 24 hr tablet Take 2 tablets (1,000 mg total) by mouth at bedtime. 180 tablet 3   montelukast (SINGULAIR) 10 MG tablet Take 10 mg by mouth at bedtime.     propranolol (INDERAL) 20 MG tablet Take 20 mg by mouth daily.      No current facility-administered medications on file prior to visit.     PAST MEDICAL HISTORY: Past Medical History:  Diagnosis Date   Alopecia    Asthma    Back pain    Diabetes (HCC)    Diabetes mellitus (HCC)    GERD (gastroesophageal reflux disease)    Hyperlipidemia    Hypertension    Joint pain    Lactose intolerance    Obesity    Raynauds syndrome    Swelling    Tremor    Vitamin D deficiency     PAST SURGICAL HISTORY: Past Surgical History:  Procedure Laterality Date   ABDOMINAL HYSTERECTOMY     BREAST BIOPSY     MYOMECTOMY      SOCIAL HISTORY: Social History   Tobacco Use   Smoking status: Never Smoker   Smokeless tobacco: Never Used  Substance Use Topics   Alcohol use: No   Drug use: No    FAMILY HISTORY: Family History  Problem Relation Age of Onset   Multiple myeloma Mother    Hypertension Mother    Hyperlipidemia Mother    Cancer Mother    Obesity Mother    Hypertension Father    Hyperlipidemia Father    Cancer Father    Asthma Other    Hyperlipidemia Other    Hypertension Other    Cancer Other    COPD Other    Stroke Other    Breast cancer Maternal Aunt    Breast cancer Paternal Aunt    Breast cancer Maternal Aunt     ROS: Review of Systems  Constitutional: Negative for weight loss.  Gastrointestinal: Negative for diarrhea, nausea and  vomiting.  Endo/Heme/Allergies:       Negative for hypoglycemia    PHYSICAL EXAM: Blood pressure 116/77, pulse (!) 103, temperature 98.2 F (36.8 C), temperature source Oral, height 5' 4"  (1.626 m), weight 261 lb 12.8 oz (118.8 kg), last menstrual period 08/31/2008, SpO2 97 %. Body mass index is 44.94 kg/m. Physical Exam Vitals signs reviewed.  Constitutional:      Appearance: Normal appearance. She is well-developed. She is obese.  Cardiovascular:     Rate and Rhythm: Normal rate.  Pulmonary:     Effort: Pulmonary effort is normal.  Musculoskeletal: Normal range of motion.  Skin:    General: Skin is warm and dry.  Neurological:     Mental Status: She is alert and oriented to person, place, and time.  Psychiatric:        Mood and Affect: Mood normal.        Behavior: Behavior normal.     RECENT LABS AND TESTS: BMET    Component Value Date/Time   NA 144 07/19/2018 1048   K 3.7 07/19/2018 1048   CL 100 07/19/2018 1048   CO2 26 07/19/2018 1048   GLUCOSE 122 (H) 07/19/2018 1048   GLUCOSE 161 (H) 03/15/2018 1739   BUN 13 07/19/2018 1048   CREATININE 0.62 07/19/2018 1048   CALCIUM 9.9 07/19/2018 1048   GFRNONAA 100 07/19/2018 1048   GFRAA 115 07/19/2018 1048   Lab Results  Component Value Date   HGBA1C 7.1 (A) 09/20/2018   HGBA1C 6.8 (H) 07/19/2018   HGBA1C 7.2 (H) 03/07/2018   HGBA1C 7.0 (A) 01/10/2018   HGBA1C 7.4 (H) 10/26/2017   Lab Results  Component Value Date   INSULIN 42.1 (H) 07/19/2018   INSULIN 43.1 (H) 03/07/2018   INSULIN 56.5 (H) 10/26/2017   INSULIN 39.0 (H) 09/29/2016   INSULIN 38.5 (H) 05/09/2016   CBC    Component Value Date/Time   WBC 6.4 03/15/2018 1739   RBC 4.99 03/15/2018 1739   HGB 14.1 03/15/2018 1739   HGB 12.1 01/27/2016 1006   HCT 44.4 03/15/2018 1739   HCT 37.0 01/27/2016 1006   PLT 216 03/15/2018 1739   MCV 89.0 03/15/2018 1739   MCV 86 01/27/2016 1006   MCH 28.3 03/15/2018 1739   MCHC 31.8 03/15/2018 1739   RDW 13.4  03/15/2018 1739   RDW 13.3 01/27/2016 1006   LYMPHSABS 2.0 01/27/2016 1006   EOSABS 0.2 01/27/2016 1006   BASOSABS 0.0 01/27/2016 1006   Iron/TIBC/Ferritin/ %Sat No results found for: IRON, TIBC, FERRITIN, IRONPCTSAT Lipid Panel     Component Value Date/Time   CHOL 173 07/19/2018 1048   TRIG 144 07/19/2018 1048   HDL 54 07/19/2018 1048   LDLCALC 90 07/19/2018 1048   Hepatic Function Panel     Component Value Date/Time   PROT 6.8 07/19/2018 1048   ALBUMIN 4.1 07/19/2018 1048   AST 17 07/19/2018 1048   ALT 32 07/19/2018 1048   ALKPHOS 92 07/19/2018 1048   BILITOT 0.4 07/19/2018 1048      Component Value Date/Time   TSH 0.63 09/20/2018 1049   TSH 0.50 10/05/2017 0956   TSH 0.52 04/04/2017 0859     Ref. Range 07/19/2018 10:48  Vitamin D, 25-Hydroxy Latest Ref Range: 30.0 - 100.0 ng/mL 55.4    OBESITY BEHAVIORAL INTERVENTION VISIT  Today's visit was # 50  Starting weight: 259 lbs Starting date: 01/27/2016 Today's weight : 261 lbs Today's date: 10/16/2018 Total lbs lost to date: 0    10/16/2018  Height 5' 4"  (1.626 m)  Weight 261 lb 12.8 oz (118.8 kg)  BMI (Calculated) 44.92  BLOOD PRESSURE - SYSTOLIC 562  BLOOD PRESSURE - DIASTOLIC 77   Body Fat % 56.3 %  Total Body Water (lbs) 92.6 lbs    ASK: We discussed the diagnosis of obesity with [name] today and [name] agreed to give [name] permission to discuss obesity behavioral modification therapy today.  ASSESS: Berda has the diagnosis of obesity and her BMI today is 44.92 Aiza is in the action stage of change   ADVISE: Tesslyn was educated on the multiple health  risks of obesity as well as the benefit of weight loss to improve her health. She was advised of the need for long term treatment and the importance of lifestyle modifications to improve her current health and to decrease her risk of future health problems.  AGREE: Multiple dietary modification options and treatment options were discussed and   Shiana agreed to follow the recommendations documented in the above note.  ARRANGE: Fauna was educated on the importance of frequent visits to treat obesity as outlined per CMS and USPSTF guidelines and agreed to schedule her next follow up appointment today.  Corey Skains, am acting as transcriptionist for Abby Potash, PA-C I, Abby Potash, PA-C have reviewed above note and agree with its content

## 2018-10-31 NOTE — Telephone Encounter (Signed)
done

## 2018-11-01 MED FILL — ATORVASTATIN 20 MG TABLET: 20 | 90 days supply | Qty: 90 | Fill #0

## 2018-11-06 ENCOUNTER — Other Ambulatory Visit: Payer: Self-pay

## 2018-11-06 ENCOUNTER — Encounter (INDEPENDENT_AMBULATORY_CARE_PROVIDER_SITE_OTHER): Payer: Self-pay | Admitting: Physician Assistant

## 2018-11-06 ENCOUNTER — Ambulatory Visit (INDEPENDENT_AMBULATORY_CARE_PROVIDER_SITE_OTHER): Payer: 59 | Admitting: Physician Assistant

## 2018-11-06 VITALS — BP 122/86 | HR 98 | Temp 98.6°F | Ht 64.0 in | Wt 263.0 lb

## 2018-11-06 DIAGNOSIS — E7849 Other hyperlipidemia: Secondary | ICD-10-CM

## 2018-11-06 DIAGNOSIS — Z6835 Body mass index (BMI) 35.0-35.9, adult: Secondary | ICD-10-CM | POA: Diagnosis not present

## 2018-11-07 NOTE — Progress Notes (Signed)
Office: [phone]  /  Fax: [phone]   HPI:   Chief Complaint: OBESITY Veronica Russell is here to discuss her progress with her obesity treatment plan. She is on the Category 3 plan and is following her eating plan approximately 60% of the time. She states she is exercising 0 minutes 0 times per week. Teniyah reports that she is eating bacon with breakfast. She states she is not eating all of her protein for dinner. Her weight is 263 lb (119.3 kg) today and has had a weight gain of 2 lbs since her last visit. She has lost 0 lbs since starting treatment with [name].  Hyperlipidemia [name] has hyperlipidemia and has been trying to improve her cholesterol levels with intensive lifestyle modification including a low saturated fat diet, exercise and weight loss. She is on atorvastatin and denies any chest pain.  ASSESSMENT AND PLAN:  No diagnosis found.  PLAN:  Hyperlipidemia [name] was informed of the American Heart Association Guidelines emphasizing intensive lifestyle modifications as the first line treatment for hyperlipidemia. We discussed many lifestyle modifications today in depth, and [name] will continue to work on decreasing saturated fats such as fatty red meat, butter and many fried foods. She will continue medications, increase vegetables and lean protein in her diet, and continue to work on exercise and weight loss efforts.  I spent > than 50% of the 25 minute visit on counseling as documented in the note.  Obesity [name] is currently in the action stage of change. As such, her goal is to continue with weight loss efforts. She has agreed to follow the Category 3 plan. [name] has been instructed to work up to a goal of 150 minutes of combined cardio and strengthening exercise per week for weight loss and overall health benefits. We discussed the following Behavioral Modification Strategies today: work on meal planning and easy cooking plans, and keeping healthy foods in the home.  [name]  has agreed to follow-up with our clinic in 3 weeks. She was informed of the importance of frequent follow-up visits to maximize her success with intensive lifestyle modifications for her multiple health conditions.  I spent > than 50% of the 25 minute visit on counseling as documented in the note.   ALLERGIES: Allergies  Allergen Reactions   Erythromycin Nausea Only   Milk-Related Compounds Other (See Comments)    Lactose intollerance    MEDICATIONS: Current Outpatient Medications on File Prior to Visit  Medication Sig Dispense Refill   albuterol (VENTOLIN HFA) 108 (90 Base) MCG/ACT inhaler Inhale 2 puffs into the lungs every 6 (six) hours as needed for wheezing or shortness of breath. 1 Inhaler 0   Alcohol Swabs (ALCOHOL WIPES) 70 % PADS 30 Packages by Does not apply route 2 (two) times daily. 100 each 0   amLODipine (NORVASC) 5 MG tablet Take 5 mg by mouth daily.     atorvastatin (LIPITOR) 20 MG tablet Take 20 mg by mouth daily.     Cholecalciferol 4000 units CAPS Take 4,000 Units by mouth daily.      glipiZIDE (GLUCOTROL) 5 MG tablet Take 1-2 tablets (5-10 mg total) by mouth daily before supper. 90 tablet 3   glucose blood (ACCU-CHEK GUIDE) test strip Use twice daily 100 each prn   hydrochlorothiazide (HYDRODIURIL) 25 MG tablet Take 25 mg by mouth every morning.  1   Insulin Pen Needle (BD PEN NEEDLE NANO U/F) 32G X 4 MM MISC 1 Package by Does not apply route daily. 100 each prn  Lancets (ACCU-CHEK SAFE-T PRO) lancets Use as instructed 60 each 0   liraglutide (VICTOZA) 18 MG/3ML SOPN INJECT 1.8 MGS INTO THE SKIN EVERY MORNING. 9 mL 3   losartan (COZAAR) 50 MG tablet Take 50 mg by mouth daily.      Melatonin 3 MG TABS Take 1 tablet by mouth at bedtime as needed.     metFORMIN (GLUCOPHAGE-XR) 500 MG 24 hr tablet Take 2 tablets (1,000 mg total) by mouth at bedtime. 180 tablet 3   montelukast (SINGULAIR) 10 MG tablet Take 10 mg by mouth at bedtime.     propranolol  (INDERAL) 20 MG tablet Take 20 mg by mouth daily.      No current facility-administered medications on file prior to visit.     PAST MEDICAL HISTORY: Past Medical History:  Diagnosis Date   Alopecia    Asthma    Back pain    Diabetes (HCC)    Diabetes mellitus (HCC)    GERD (gastroesophageal reflux disease)    Hyperlipidemia    Hypertension    Joint pain    Lactose intolerance    Obesity    Raynauds syndrome    Swelling    Tremor    Vitamin D deficiency     PAST SURGICAL HISTORY: Past Surgical History:  Procedure Laterality Date   ABDOMINAL HYSTERECTOMY     BREAST BIOPSY     MYOMECTOMY      SOCIAL HISTORY: Social History   Tobacco Use   Smoking status: Never Smoker   Smokeless tobacco: Never Used  Substance Use Topics   Alcohol use: No   Drug use: No    FAMILY HISTORY: Family History  Problem Relation Age of Onset   Multiple myeloma Mother    Hypertension Mother    Hyperlipidemia Mother    Cancer Mother    Obesity Mother    Hypertension Father    Hyperlipidemia Father    Cancer Father    Asthma Other    Hyperlipidemia Other    Hypertension Other    Cancer Other    COPD Other    Stroke Other    Breast cancer Maternal Aunt    Breast cancer Paternal Aunt    Breast cancer Maternal Aunt    ROS: Review of Systems  Cardiovascular: Negative for chest pain.   PHYSICAL EXAM: Blood pressure 122/86, pulse 98, temperature 98.6 F (37 C), temperature source Oral, height '5\' 4"'$  (1.626 m), weight 263 lb (119.3 kg), last menstrual period 08/31/2008, SpO2 96 %. Body mass index is 45.14 kg/m. Physical Exam Vitals signs reviewed.  Constitutional:      Appearance: Normal appearance. She is obese.  Cardiovascular:     Rate and Rhythm: Normal rate.     Pulses: Normal pulses.  Pulmonary:     Effort: Pulmonary effort is normal.     Breath sounds: Normal breath sounds.  Musculoskeletal: Normal range of motion.  Skin:     General: Skin is warm and dry.  Neurological:     Mental Status: She is alert and oriented to person, place, and time.  Psychiatric:        Behavior: Behavior normal.   RECENT LABS AND TESTS: BMET    Component Value Date/Time   NA 144 07/19/2018 1048   K 3.7 07/19/2018 1048   CL 100 07/19/2018 1048   CO2 26 07/19/2018 1048   GLUCOSE 122 (H) 07/19/2018 1048   GLUCOSE 161 (H) 03/15/2018 1739   BUN 13 07/19/2018 1048  CREATININE 0.62 07/19/2018 1048   CALCIUM 9.9 07/19/2018 1048   GFRNONAA 100 07/19/2018 1048   GFRAA 115 07/19/2018 1048   Lab Results  Component Value Date   HGBA1C 7.1 (A) 09/20/2018   HGBA1C 6.8 (H) 07/19/2018   HGBA1C 7.2 (H) 03/07/2018   HGBA1C 7.0 (A) 01/10/2018   HGBA1C 7.4 (H) 10/26/2017   Lab Results  Component Value Date   INSULIN 42.1 (H) 07/19/2018   INSULIN 43.1 (H) 03/07/2018   INSULIN 56.5 (H) 10/26/2017   INSULIN 39.0 (H) 09/29/2016   INSULIN 38.5 (H) 05/09/2016   CBC    Component Value Date/Time   WBC 6.4 03/15/2018 1739   RBC 4.99 03/15/2018 1739   HGB 14.1 03/15/2018 1739   HGB 12.1 01/27/2016 1006   HCT 44.4 03/15/2018 1739   HCT 37.0 01/27/2016 1006   PLT 216 03/15/2018 1739   MCV 89.0 03/15/2018 1739   MCV 86 01/27/2016 1006   MCH 28.3 03/15/2018 1739   MCHC 31.8 03/15/2018 1739   RDW 13.4 03/15/2018 1739   RDW 13.3 01/27/2016 1006   LYMPHSABS 2.0 01/27/2016 1006   EOSABS 0.2 01/27/2016 1006   BASOSABS 0.0 01/27/2016 1006   Iron/TIBC/Ferritin/ %Sat No results found for: IRON, TIBC, FERRITIN, IRONPCTSAT Lipid Panel     Component Value Date/Time   CHOL 173 07/19/2018 1048   TRIG 144 07/19/2018 1048   HDL 54 07/19/2018 1048   LDLCALC 90 07/19/2018 1048   Hepatic Function Panel     Component Value Date/Time   PROT 6.8 07/19/2018 1048   ALBUMIN 4.1 07/19/2018 1048   AST 17 07/19/2018 1048   ALT 32 07/19/2018 1048   ALKPHOS 92 07/19/2018 1048   BILITOT 0.4 07/19/2018 1048      Component Value Date/Time   TSH  0.63 09/20/2018 1049   TSH 0.50 10/05/2017 0956   TSH 0.52 04/04/2017 0859   Results for CAMYLA, CAMPOSANO (MRN [id number]) as of 11/07/2018 07:32  Ref. Range 07/19/2018 10:48  Vitamin D, 25-Hydroxy Latest Ref Range: 30.0 - 100.0 ng/mL 55.4   OBESITY BEHAVIORAL INTERVENTION VISIT  Today's visit was #51  Starting weight: 259 lbs Starting date: 01/27/2016 Today's weight: 263 lbs  Today's date: 11/06/2018 Total lbs lost to date: 0    11/06/2018  Height '5\' 4"'$  (1.626 m)  Weight 263 lb (119.3 kg)  BMI (Calculated) 45.12  BLOOD PRESSURE - SYSTOLIC 409  BLOOD PRESSURE - DIASTOLIC 86   Body Fat % 81.1 %  Total Body Water (lbs) 86.6 lbs   ASK: We discussed the diagnosis of obesity with [name] today and [name] agreed to give [name] permission to discuss obesity behavioral modification therapy today.  ASSESS: Katie has the diagnosis of obesity and her BMI today is 45.1. Neesa is in the action stage of change.   ADVISE: Adilene was educated on the multiple health risks of obesity as well as the benefit of weight loss to improve her health. She was advised of the need for long term treatment and the importance of lifestyle modifications to improve her current health and to decrease her risk of future health problems.  AGREE: Multiple dietary modification options and treatment options were discussed and  Marquitta agreed to follow the recommendations documented in the above note.  ARRANGE: Kaitrin was educated on the importance of frequent visits to treat obesity as outlined per CMS and USPSTF guidelines and agreed to schedule her next follow up appointment today.  Migdalia Dk, am acting as Location manager  for Abby Potash, PA-C

## 2018-11-12 MED FILL — ACCU-CHEK GUIDE TEST STRIP: 50 days supply | Qty: 100 | Fill #2

## 2018-11-12 MED FILL — metFORMIN HCL ER 500 MG TB2: 500 | 90 days supply | Qty: 180 | Fill #1

## 2018-11-12 MED FILL — VICTOZA 18 MG/3 ML INJECT P: 18 | 30 days supply | Qty: 9 | Fill #3

## 2018-11-12 MED FILL — ACCU-CHEK FASTCLIX LANCETS: 90 days supply | Qty: 102 | Fill #3

## 2018-11-28 DIAGNOSIS — E7800 Pure hypercholesterolemia, unspecified: Secondary | ICD-10-CM | POA: Diagnosis not present

## 2018-11-28 DIAGNOSIS — I10 Essential (primary) hypertension: Secondary | ICD-10-CM | POA: Diagnosis not present

## 2018-11-28 DIAGNOSIS — Z0000 Encounter for general adult medical examination without abnormal findings: Secondary | ICD-10-CM | POA: Diagnosis not present

## 2018-11-28 DIAGNOSIS — E119 Type 2 diabetes mellitus without complications: Secondary | ICD-10-CM | POA: Diagnosis not present

## 2018-11-28 DIAGNOSIS — Z1159 Encounter for screening for other viral diseases: Secondary | ICD-10-CM | POA: Diagnosis not present

## 2018-11-29 ENCOUNTER — Encounter: Payer: Self-pay | Admitting: Internal Medicine

## 2018-11-30 MED FILL — OMEPRAZOLE 20 MG CAPSULE DR: 20 | 90 days supply | Qty: 90 | Fill #0

## 2018-12-03 ENCOUNTER — Ambulatory Visit (INDEPENDENT_AMBULATORY_CARE_PROVIDER_SITE_OTHER): Payer: 59 | Admitting: Physician Assistant

## 2018-12-03 MED FILL — MONTELUKAST SOD 10 MG TAB: 10 | 90 days supply | Qty: 90 | Fill #0

## 2018-12-03 MED FILL — LOSARTAN POTASSIUM 50 MG TA: 50 | 90 days supply | Qty: 90 | Fill #0

## 2018-12-12 MED FILL — VICTOZA 18 MG/3 ML INJECT P: 18 | 30 days supply | Qty: 9 | Fill #0

## 2018-12-12 MED FILL — HYDROCHLOROTHIAZIDE 25 MG T: 25 | 90 days supply | Qty: 90 | Fill #0

## 2018-12-18 ENCOUNTER — Ambulatory Visit (INDEPENDENT_AMBULATORY_CARE_PROVIDER_SITE_OTHER): Payer: 59 | Admitting: Physician Assistant

## 2018-12-18 ENCOUNTER — Other Ambulatory Visit: Payer: Self-pay

## 2018-12-18 ENCOUNTER — Encounter (INDEPENDENT_AMBULATORY_CARE_PROVIDER_SITE_OTHER): Payer: Self-pay | Admitting: Physician Assistant

## 2018-12-18 VITALS — BP 134/86 | HR 86 | Temp 98.4°F | Ht 64.0 in | Wt 264.0 lb

## 2018-12-18 DIAGNOSIS — Z6841 Body Mass Index (BMI) 40.0 and over, adult: Secondary | ICD-10-CM

## 2018-12-18 DIAGNOSIS — E119 Type 2 diabetes mellitus without complications: Secondary | ICD-10-CM | POA: Diagnosis not present

## 2018-12-19 NOTE — Progress Notes (Signed)
° °Office: [phone]  /  Fax: [phone] ° ° °HPI:  ° °Chief Complaint: OBESITY °Veronica Russell is here to discuss her progress with her obesity treatment plan. She is on the Category 3 plan and is following her eating plan approximately 60 % of the time. She states she is exercising 0 minutes 0 times per week. °Veronica Russell reports that she has had a lot of family stress recently and she has not been eating on the plan. She does not get all of her protein in throughout the day. °Her weight is 264 lb (119.7 kg) today and has had a weight gain of 1 pound over a period of 6 weeks since her last visit. She has gained 5 lbs since starting treatment with us. ° °Diabetes II °Veronica Russell has a diagnosis of diabetes type II. She is on Metformin, Victoza and Glipizide. Veronica Russell states fasting BGs range between 120 and 160 and she denies any hypoglycemic episodes or polyphagia. She has been working on intensive lifestyle modifications including diet, exercise, and weight loss to help control her blood glucose levels. ° °ASSESSMENT AND PLAN: ° °Type 2 diabetes mellitus without complication, without long-term current use of insulin (HCC) ° °Class 3 severe obesity with serious comorbidity and body mass index (BMI) of 45.0 to 49.9 in adult, unspecified obesity type (HCC) ° °PLAN: ° °Diabetes II °Veronica Russell has been given extensive diabetes education by myself today including ideal fasting and post-prandial blood glucose readings, individual ideal Hgb A1c goals and hypoglycemia prevention. We discussed the importance of good blood sugar control to decrease the likelihood of diabetic complications such as nephropathy, neuropathy, limb loss, blindness, coronary artery disease, and death. We discussed the importance of intensive lifestyle modification including diet, exercise and weight loss as the first line treatment for diabetes. Veronica Russell will continue with medications and weight loss and she will follow up at the agreed upon time. ° °I spent > than 50% of  the 25 minute visit on counseling as documented in the note. ° °Obesity °Veronica Russell is currently in the action stage of change. °As such, her goal is to continue with weight loss efforts °She has agreed to follow the Category 3 plan °Veronica Russell has been instructed to work up to a goal of 150 minutes of combined cardio and strengthening exercise per week for weight loss and overall health benefits. °We discussed the following Behavioral Modification Strategies today: keeping healthy foods in the home and work on meal planning and easy cooking plans ° °Veronica Russell has agreed to follow up with our clinic in 3 to 4 weeks. She was informed of the importance of frequent follow up visits to maximize her success with intensive lifestyle modifications for her multiple health conditions. ° °I spent > than 50% of the 25 minute visit on counseling as documented in the note. ° ° ° °ALLERGIES: °Allergies  °Allergen Reactions  °• Erythromycin Nausea Only  °• Milk-Related Compounds Other (See Comments)  °  Lactose intollerance  ° ° °MEDICATIONS: °Current Outpatient Medications on File Prior to Visit  °Medication Sig Dispense Refill  °• albuterol (VENTOLIN HFA) 108 (90 Base) MCG/ACT inhaler Inhale 2 puffs into the lungs every 6 (six) hours as needed for wheezing or shortness of breath. 1 Inhaler 0  °• Alcohol Swabs (ALCOHOL WIPES) 70 % PADS 30 Packages by Does not apply route 2 (two) times daily. 100 each 0  °• amLODipine (NORVASC) 5 MG tablet Take 5 mg by mouth daily.    °• atorvastatin (LIPITOR) 20 MG tablet Take   Take 20 mg by mouth daily.     Cholecalciferol 4000 units CAPS Take 4,000 Units by mouth daily.      glipiZIDE (GLUCOTROL) 5 MG tablet Take 1-2 tablets (5-10 mg total) by mouth daily before supper. 90 tablet 3   glucose blood (ACCU-CHEK GUIDE) test strip Use twice daily 100 each prn   hydrochlorothiazide (HYDRODIURIL) 25 MG tablet Take 25 mg by mouth every morning.  1   Insulin Pen Needle (BD PEN NEEDLE NANO U/F) 32G X 4 MM MISC 1  Package by Does not apply route daily. 100 each prn   Lancets (ACCU-CHEK SAFE-T PRO) lancets Use as instructed 60 each 0   liraglutide (VICTOZA) 18 MG/3ML SOPN INJECT 1.8 MGS INTO THE SKIN EVERY MORNING. 9 mL 3   losartan (COZAAR) 50 MG tablet Take 50 mg by mouth daily.      Melatonin 3 MG TABS Take 1 tablet by mouth at bedtime as needed.     metFORMIN (GLUCOPHAGE-XR) 500 MG 24 hr tablet Take 2 tablets (1,000 mg total) by mouth at bedtime. 180 tablet 3   montelukast (SINGULAIR) 10 MG tablet Take 10 mg by mouth at bedtime.     propranolol (INDERAL) 20 MG tablet Take 20 mg by mouth daily.      No current facility-administered medications on file prior to visit.     PAST MEDICAL HISTORY: Past Medical History:  Diagnosis Date   Alopecia    Asthma    Back pain    Diabetes (HCC)    Diabetes mellitus (HCC)    GERD (gastroesophageal reflux disease)    Hyperlipidemia    Hypertension    Joint pain    Lactose intolerance    Obesity    Raynauds syndrome    Swelling    Tremor    Vitamin D deficiency     PAST SURGICAL HISTORY: Past Surgical History:  Procedure Laterality Date   ABDOMINAL HYSTERECTOMY     BREAST BIOPSY     MYOMECTOMY      SOCIAL HISTORY: Social History   Tobacco Use   Smoking status: Never Smoker   Smokeless tobacco: Never Used  Substance Use Topics   Alcohol use: No   Drug use: No    FAMILY HISTORY: Family History  Problem Relation Age of Onset   Multiple myeloma Mother    Hypertension Mother    Hyperlipidemia Mother    Cancer Mother    Obesity Mother    Hypertension Father    Hyperlipidemia Father    Cancer Father    Asthma Other    Hyperlipidemia Other    Hypertension Other    Cancer Other    COPD Other    Stroke Other    Breast cancer Maternal Aunt    Breast cancer Paternal Aunt    Breast cancer Maternal Aunt     ROS: Review of Systems  Constitutional: Negative for weight loss.    Endo/Heme/Allergies:       Negative for polyphagia Negative for hypoglycemia  Psychiatric/Behavioral:       Positive for Stress    PHYSICAL EXAM: Blood pressure 134/86, pulse 86, temperature 98.4 F (36.9 C), temperature source Oral, height 5' 4" (1.626 m), weight 264 lb (119.7 kg), last menstrual period 08/31/2008, SpO2 97 %. Body mass index is 45.32 kg/m. Physical Exam Vitals signs reviewed.  Constitutional:      Appearance: Normal appearance. She is well-developed. She is obese.  Cardiovascular:     Rate and Rhythm: Normal rate.  Pulmonary:  °   Effort: Pulmonary effort is normal.  °Musculoskeletal: Normal range of motion.  °Skin: °   General: Skin is warm and dry.  °Neurological:  °   Mental Status: She is alert and oriented to person, place, and time.  °Psychiatric:     °   Mood and Affect: Mood normal.     °   Behavior: Behavior normal.  ° ° ° °RECENT LABS AND TESTS: °BMET °   °Component Value Date/Time  ° NA 144 07/19/2018 1048  ° K 3.7 07/19/2018 1048  ° CL 100 07/19/2018 1048  ° CO2 26 07/19/2018 1048  ° GLUCOSE 122 (H) 07/19/2018 1048  ° GLUCOSE 161 (H) 03/15/2018 1739  ° BUN 13 07/19/2018 1048  ° CREATININE 0.62 07/19/2018 1048  ° CALCIUM 9.9 07/19/2018 1048  ° GFRNONAA 100 07/19/2018 1048  ° GFRAA 115 07/19/2018 1048  ° °Lab Results  °Component Value Date  ° HGBA1C 7.1 (A) 09/20/2018  ° HGBA1C 6.8 (H) 07/19/2018  ° HGBA1C 7.2 (H) 03/07/2018  ° HGBA1C 7.0 (A) 01/10/2018  ° HGBA1C 7.4 (H) 10/26/2017  ° °Lab Results  °Component Value Date  ° INSULIN 42.1 (H) 07/19/2018  ° INSULIN 43.1 (H) 03/07/2018  ° INSULIN 56.5 (H) 10/26/2017  ° INSULIN 39.0 (H) 09/29/2016  ° INSULIN 38.5 (H) 05/09/2016  ° °CBC °   °Component Value Date/Time  ° WBC 6.4 03/15/2018 1739  ° RBC 4.99 03/15/2018 1739  ° HGB 14.1 03/15/2018 1739  ° HGB 12.1 01/27/2016 1006  ° HCT 44.4 03/15/2018 1739  ° HCT 37.0 01/27/2016 1006  ° PLT 216 03/15/2018 1739  ° MCV 89.0 03/15/2018 1739  ° MCV 86 01/27/2016 1006  ° MCH 28.3  03/15/2018 1739  ° MCHC 31.8 03/15/2018 1739  ° RDW 13.4 03/15/2018 1739  ° RDW 13.3 01/27/2016 1006  ° LYMPHSABS 2.0 01/27/2016 1006  ° EOSABS 0.2 01/27/2016 1006  ° BASOSABS 0.0 01/27/2016 1006  ° °Iron/TIBC/Ferritin/ %Sat °No results found for: IRON, TIBC, FERRITIN, IRONPCTSAT °Lipid Panel  °   °Component Value Date/Time  ° CHOL 173 07/19/2018 1048  ° TRIG 144 07/19/2018 1048  ° HDL 54 07/19/2018 1048  ° LDLCALC 90 07/19/2018 1048  ° °Hepatic Function Panel  °   °Component Value Date/Time  ° PROT 6.8 07/19/2018 1048  ° ALBUMIN 4.1 07/19/2018 1048  ° AST 17 07/19/2018 1048  ° ALT 32 07/19/2018 1048  ° ALKPHOS 92 07/19/2018 1048  ° BILITOT 0.4 07/19/2018 1048  ° °   °Component Value Date/Time  ° TSH 0.63 09/20/2018 1049  ° TSH 0.50 10/05/2017 0956  ° TSH 0.52 04/04/2017 0859  ° ° ° Ref. Range 07/19/2018 10:48  °Vitamin D, 25-Hydroxy Latest Ref Range: 30.0 - 100.0 ng/mL 55.4  ° ° °OBESITY BEHAVIORAL INTERVENTION VISIT ° °Today's visit was # 52  ° °Starting weight: 259 lbs °Starting date: 01/27/2016 °Today's weight : 264 lbs °Today's date: 12/18/2018 °Total lbs lost to date: 0 °  ° 12/18/2018  °Height 5' 4" (1.626 m)  °Weight 264 lb (119.7 kg)  °BMI (Calculated) 45.29  °BLOOD PRESSURE - SYSTOLIC 134  °BLOOD PRESSURE - DIASTOLIC 86  ° Body Fat % 54.4 %  °Total Body Water (lbs) 90.6 lbs  ° ° °ASK: °We discussed the diagnosis of obesity with Veronica Russell today and Veronica Russell agreed to give us permission to discuss obesity behavioral modification therapy today. ° °ASSESS: °Veronica Russell has the diagnosis of obesity and her BMI today is 45.29 °Feven is   in the action stage of change  ° °ADVISE: °Veronica Russell was educated on the multiple health risks of obesity as well as the benefit of weight loss to improve her health. She was advised of the need for long term treatment and the importance of lifestyle modifications to improve her current health and to decrease her risk of future health problems. ° °AGREE: °Multiple dietary modification  options and treatment options were discussed and  Veronica Russell agreed to follow the recommendations documented in the above note. ° °ARRANGE: °Veronica Russell was educated on the importance of frequent visits to treat obesity as outlined per CMS and USPSTF guidelines and agreed to schedule her next follow up appointment today. ° °I, Veronica Russell, am acting as transcriptionist for Veronica Aguilar, PA-C °I, Veronica Aguilar, PA-C have reviewed above note and agree with its content ° °

## 2018-12-26 ENCOUNTER — Encounter: Payer: Self-pay | Admitting: Internal Medicine

## 2018-12-26 ENCOUNTER — Other Ambulatory Visit: Payer: Self-pay

## 2018-12-26 ENCOUNTER — Ambulatory Visit (AMBULATORY_SURGERY_CENTER): Payer: 59 | Admitting: *Deleted

## 2018-12-26 VITALS — Temp 97.3°F | Ht 64.0 in | Wt 269.0 lb

## 2018-12-26 DIAGNOSIS — Z1159 Encounter for screening for other viral diseases: Secondary | ICD-10-CM

## 2018-12-26 DIAGNOSIS — Z86010 Personal history of colonic polyps: Secondary | ICD-10-CM

## 2018-12-26 DIAGNOSIS — Z800 Family history of malignant neoplasm of digestive organs: Secondary | ICD-10-CM

## 2018-12-26 MED ORDER — SUPREP BOWEL PREP KIT 17.5-3.13-1.6 GM/177ML PO SOLN: 1.0000 | Freq: Once | ORAL | 0 refills | Status: AC

## 2018-12-26 MED FILL — AMLODIPINE BESYLATE 5 MG TA: 5 | 90 days supply | Qty: 90 | Fill #0

## 2018-12-26 MED FILL — ACCU-CHEK GUIDE TEST STRIP: 50 days supply | Qty: 100 | Fill #3

## 2018-12-26 MED FILL — glipiZIDE 5 MG TABS: 5 | 45 days supply | Qty: 90 | Fill #1

## 2018-12-26 MED FILL — SUPREP BOWEL PREP KIT: 17.5-3.13-1 | 1 days supply | Qty: 354 | Fill #0

## 2018-12-26 NOTE — Progress Notes (Signed)
No egg or soy allergy known to patient  No issues with past sedation with any surgeries  or procedures, no intubation problems  No diet pills per patient No home 02 use per patient  No blood thinners per patient  Pt denies issues with constipation  No A fib or A flutter  EMMI video sent to pt's e mail   Cov test 12-7 Monday at 305 pm   Due to the COVID-19 pandemic we are asking patients to follow these guidelines. Please only bring one care partner. Please be aware that your care partner may wait in the car in the parking lot or if they feel like they will be too hot to wait in the car, they may wait in the lobby on the 4th floor. All care partners are required to wear a mask the entire time (we do not have any that we can provide them), they need to practice social distancing, and we will do a Covid check for all patient's and care partners when you arrive. Also we will check their temperature and your temperature. If the care partner waits in their car they need to stay in the parking lot the entire time and we will call them on their cell phone when the patient is ready for discharge so they can bring the car to the front of the building. Also all patient's will need to wear a mask into building.

## 2019-01-09 MED FILL — UNIFINE PENTIPS 32GX5/32: 32G X 4 MM | 90 days supply | Qty: 100 | Fill #3

## 2019-01-09 MED FILL — VICTOZA 18 MG/3 ML INJECT P: 18 | 30 days supply | Qty: 9 | Fill #1

## 2019-01-09 MED FILL — UNIFINE PENTIPS 32GX5/32": 32G X 4 MM | 90 days supply | Qty: 100 | Fill #3

## 2019-01-10 ENCOUNTER — Encounter: Payer: 59 | Admitting: Internal Medicine

## 2019-01-15 ENCOUNTER — Encounter (INDEPENDENT_AMBULATORY_CARE_PROVIDER_SITE_OTHER): Payer: Self-pay | Admitting: Physician Assistant

## 2019-01-15 ENCOUNTER — Ambulatory Visit (INDEPENDENT_AMBULATORY_CARE_PROVIDER_SITE_OTHER): Payer: 59 | Admitting: Physician Assistant

## 2019-01-15 ENCOUNTER — Other Ambulatory Visit: Payer: Self-pay

## 2019-01-15 VITALS — BP 131/84 | HR 100 | Temp 98.1°F | Ht 64.0 in | Wt 264.0 lb

## 2019-01-15 DIAGNOSIS — E7849 Other hyperlipidemia: Secondary | ICD-10-CM

## 2019-01-15 DIAGNOSIS — E119 Type 2 diabetes mellitus without complications: Secondary | ICD-10-CM | POA: Diagnosis not present

## 2019-01-15 DIAGNOSIS — Z6841 Body Mass Index (BMI) 40.0 and over, adult: Secondary | ICD-10-CM

## 2019-01-16 NOTE — Progress Notes (Signed)
Office: [phone]  /  Fax: [phone]   HPI:  Chief Complaint: OBESITY Veronica Russell is here to discuss her progress with her obesity treatment plan. She is on the Category 3 plan and states she is following her eating plan approximately 60 % of the time. She states she is exercising 0 minutes 0 times per week.  Veronica Russell states that she is eating more at breakfast. She doesn't have time to eat a "big lunch" at work. She is working to get all of her food in at dinner.  Diabetes II Veronica Russell has a diagnosis of diabetes type II. She is on Glipizide, Victoza and Metformin. She has no hypoglycemia. Her fasting BGs range between 129 and 150.    Hyperlipidemia Veronica Russell has hyperlipidemia and she is on Atorvastatin. She has no chest pain.    Today's visit was # 5  Starting weight: 259 lbs Starting date: 01/27/2016 Today's weight : 264 lbs  Today's date: 01/15/2019 Total lbs lost to date: 0 Total lbs lost since last in-office visit: 0  ASSESSMENT AND PLAN:  Type 2 diabetes mellitus without complication, without long-term current use of insulin ([place])  Other hyperlipidemia  Class 3 severe obesity with serious comorbidity and body mass index (BMI) of 45.0 to 49.9 in adult, unspecified obesity type ([place])  PLAN:  Diabetes II Veronica Russell has been given diabetes education by myself today. Good blood sugar control is important to decrease the likelihood of diabetic complications such as nephropathy, neuropathy, limb loss, blindness, coronary artery disease, and death. Intensive lifestyle modification including diet, exercise and weight loss were discussed as the first line treatment for diabetes. Veronica Russell will need a new meter and strips, but patient is unsure which type and she will MyChart regarding the name.   Hyperlipidemia Intensive lifestyle modifications as the first line treatment for hyperlipidemia. We discussed many lifestyle modifications today and Veronica Russell will continue to work on diet, exercise and  weight loss efforts. Veronica Russell will continue with Atorvastatin and follow up as directed.  Obesity Veronica Russell is currently in the action stage of change. As such, her goal is to continue with weight loss efforts She has agreed to follow the Category 3 plan Veronica Russell has been instructed to work up to a goal of 150 minutes of combined cardio and strengthening exercise per week for weight loss and overall health benefits. We discussed the following Behavioral Modification Strategies today: keeping healthy foods in the home and increasing lean protein intake  [name] has agreed to follow up with our clinic in 4 weeks. She was informed of the importance of frequent follow up visits to maximize her success with intensive lifestyle modifications for her multiple health conditions.  I spent > than 50% of the 27 minute visit on counseling as documented in the note.    ALLERGIES: Allergies  Allergen Reactions  . Erythromycin Nausea Only  . Milk-Related Compounds Other (See Comments)    Lactose intollerance    MEDICATIONS: Current Outpatient Medications on File Prior to Visit  Medication Sig Dispense Refill  . albuterol (VENTOLIN HFA) 108 (90 Base) MCG/ACT inhaler Inhale 2 puffs into the lungs every 6 (six) hours as needed for wheezing or shortness of breath. 1 Inhaler 0  . Alcohol Swabs (ALCOHOL WIPES) 70 % PADS 30 Packages by Does not apply route 2 (two) times daily. 100 each 0  . amLODipine (NORVASC) 5 MG tablet Take 5 mg by mouth daily.    Marland Kitchen atorvastatin (LIPITOR) 20 MG tablet Take 20 mg by mouth daily.    Marland Kitchen  Cholecalciferol 4000 units CAPS Take 4,000 Units by mouth daily.     Marland Kitchen glipiZIDE (GLUCOTROL) 5 MG tablet Take 1-2 tablets (5-10 mg total) by mouth daily before supper. 90 tablet 3  . glucose blood (ACCU-CHEK GUIDE) test strip Use twice daily 100 each prn  . hydrochlorothiazide (HYDRODIURIL) 25 MG tablet Take 25 mg by mouth every morning.  1  . Insulin Pen Needle (BD PEN NEEDLE NANO U/F) 32G X 4 MM  MISC 1 Package by Does not apply route daily. 100 each prn  . Lancets (ACCU-CHEK SAFE-T PRO) lancets Use as instructed 60 each 0  . liraglutide (VICTOZA) 18 MG/3ML SOPN INJECT 1.8 MGS INTO THE SKIN EVERY MORNING. 9 mL 3  . losartan (COZAAR) 50 MG tablet Take 50 mg by mouth daily.     . Melatonin 3 MG TABS Take 1 tablet by mouth at bedtime as needed.    . metFORMIN (GLUCOPHAGE-XR) 500 MG 24 hr tablet Take 2 tablets (1,000 mg total) by mouth at bedtime. 180 tablet 3  . montelukast (SINGULAIR) 10 MG tablet Take 10 mg by mouth at bedtime.    . propranolol (INDERAL) 20 MG tablet Take 20 mg by mouth daily.      No current facility-administered medications on file prior to visit.    PAST MEDICAL HISTORY: Past Medical History:  Diagnosis Date  . Allergy   . Alopecia   . Asthma   . Back pain   . Blood transfusion without reported diagnosis    with hysterectomy   . Diabetes ([place])   . Diabetes mellitus ([place])   . GERD (gastroesophageal reflux disease)   . 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 Hyperlipidemia   . Hypertension   . Joint pain   . Lactose intolerance   . Obesity   . Raynauds syndrome   . Swelling   . Tremor   . Vitamin D deficiency     PAST SURGICAL HISTORY: Past Surgical History:  Procedure Laterality Date  . ABDOMINAL HYSTERECTOMY    . BREAST BIOPSY    . COLONOSCOPY    . MYOMECTOMY    . POLYPECTOMY      SOCIAL HISTORY: Social History   Tobacco Use  . Smoking status: Never Smoker  . Smokeless tobacco: Never Used  Substance Use Topics  . Alcohol use: No  . Drug use: No    FAMILY HISTORY: Family History  Problem Relation Age of Onset  . Multiple myeloma Mother   . Hypertension Mother   . Hyperlipidemia Mother   . Cancer Mother        multiple Myeloma in remission   . Obesity Mother   . Hypertension Father   . Hyperlipidemia Father   . Cancer Father   . Prostate cancer Father   . Asthma Other   . Hyperlipidemia  Other   . Hypertension Other   . Cancer Other   . COPD Other   . Stroke Other   . Breast cancer Maternal Aunt   . Breast cancer Paternal Aunt   . Breast cancer Maternal Aunt   . Colon cancer Brother 48  . Colon polyps Brother   . Esophageal cancer Neg Hx   . Rectal cancer Neg Hx   . Stomach cancer Neg Hx     ROS: Review of Systems  Constitutional: Negative for weight loss.  Cardiovascular: Negative for chest pain.  Endo/Heme/Allergies:       Negative for hypoglycemia  PHYSICAL EXAM: Blood pressure 131/84, pulse 100, temperature 98.1 F (36.7 C), temperature source Oral, height '5\' 4"'$  (1.626 m), weight 264 lb (119.7 kg), last menstrual period 08/31/2008, SpO2 97 %. Body mass index is 45.32 kg/m. Physical Exam Vitals reviewed.  Constitutional:      General: She is not in acute distress.    Appearance: Normal appearance. She is well-developed. She is obese.  Cardiovascular:     Rate and Rhythm: Normal rate.  Pulmonary:     Effort: Pulmonary effort is normal.  Musculoskeletal:        General: Normal range of motion.  Skin:    General: Skin is warm and dry.  Neurological:     Mental Status: She is alert and oriented to person, place, and time.  Psychiatric:        Mood and Affect: Mood normal.        Behavior: Behavior normal.     RECENT LABS AND TESTS: BMET    Component Value Date/Time   NA 144 07/19/2018 1048   K 3.7 07/19/2018 1048   CL 100 07/19/2018 1048   CO2 26 07/19/2018 1048   GLUCOSE 122 (H) 07/19/2018 1048   GLUCOSE 161 (H) 03/15/2018 1739   BUN 13 07/19/2018 1048   CREATININE 0.62 07/19/2018 1048   CALCIUM 9.9 07/19/2018 1048   GFRNONAA 100 07/19/2018 1048   GFRAA 115 07/19/2018 1048   Lab Results  Component Value Date   HGBA1C 7.1 (A) 09/20/2018   HGBA1C 6.8 (H) 07/19/2018   HGBA1C 7.2 (H) 03/07/2018   HGBA1C 7.0 (A) 01/10/2018   HGBA1C 7.4 (H) 10/26/2017   Lab Results  Component Value Date   INSULIN 42.1 (H) 07/19/2018   INSULIN  43.1 (H) 03/07/2018   INSULIN 56.5 (H) 10/26/2017   INSULIN 39.0 (H) 09/29/2016   INSULIN 38.5 (H) 05/09/2016   CBC    Component Value Date/Time   WBC 6.4 03/15/2018 1739   RBC 4.99 03/15/2018 1739   HGB 14.1 03/15/2018 1739   HGB 12.1 01/27/2016 1006   HCT 44.4 03/15/2018 1739   HCT 37.0 01/27/2016 1006   PLT 216 03/15/2018 1739   MCV 89.0 03/15/2018 1739   MCV 86 01/27/2016 1006   MCH 28.3 03/15/2018 1739   MCHC 31.8 03/15/2018 1739   RDW 13.4 03/15/2018 1739   RDW 13.3 01/27/2016 1006   LYMPHSABS 2.0 01/27/2016 1006   EOSABS 0.2 01/27/2016 1006   BASOSABS 0.0 01/27/2016 1006   Iron/TIBC/Ferritin/ %Sat No results found for: IRON, TIBC, FERRITIN, IRONPCTSAT Lipid Panel     Component Value Date/Time   CHOL 173 07/19/2018 1048   TRIG 144 07/19/2018 1048   HDL 54 07/19/2018 1048   LDLCALC 90 07/19/2018 1048   Hepatic Function Panel     Component Value Date/Time   PROT 6.8 07/19/2018 1048   ALBUMIN 4.1 07/19/2018 1048   AST 17 07/19/2018 1048   ALT 32 07/19/2018 1048   ALKPHOS 92 07/19/2018 1048   BILITOT 0.4 07/19/2018 1048      Component Value Date/Time   TSH 0.63 09/20/2018 1049   TSH 0.50 10/05/2017 0956   TSH 0.52 04/04/2017 0859     Ref. Range 07/19/2018 10:48  Vitamin D, 25-Hydroxy Latest Ref Range: 30.0 - 100.0 ng/mL 55.4    I, Doreene Nest, am acting as Location manager for Abby Potash, PA-C I, Abby Potash, PA-C have reviewed above note and agree with its content

## 2019-01-18 ENCOUNTER — Other Ambulatory Visit: Payer: Self-pay

## 2019-01-21 ENCOUNTER — Ambulatory Visit (INDEPENDENT_AMBULATORY_CARE_PROVIDER_SITE_OTHER): Payer: 59 | Admitting: Internal Medicine

## 2019-01-21 ENCOUNTER — Other Ambulatory Visit: Payer: Self-pay

## 2019-01-21 ENCOUNTER — Encounter: Payer: Self-pay | Admitting: Internal Medicine

## 2019-01-21 VITALS — BP 122/80 | HR 95 | Ht 64.0 in | Wt 269.0 lb

## 2019-01-21 DIAGNOSIS — E042 Nontoxic multinodular goiter: Secondary | ICD-10-CM

## 2019-01-21 DIAGNOSIS — E0590 Thyrotoxicosis, unspecified without thyrotoxic crisis or storm: Secondary | ICD-10-CM | POA: Diagnosis not present

## 2019-01-21 DIAGNOSIS — E1165 Type 2 diabetes mellitus with hyperglycemia: Secondary | ICD-10-CM | POA: Diagnosis not present

## 2019-01-21 LAB — POCT GLYCOSYLATED HEMOGLOBIN (HGB A1C): Hemoglobin A1C: 7.1 % — AB (ref 4.0–5.6)

## 2019-01-21 NOTE — Patient Instructions (Signed)
Please continue: - Metformin ER 1000 mg with dinner - Victoza 1.8 mg daily  Please change: - Glipizide 5 mg before dinner but use 10 mg before a late or large dinner.  Please return in 4 months with your sugar log.

## 2019-01-21 NOTE — Progress Notes (Signed)
Patient ID: Veronica Russell, female   DOB: [birth year], 59 y.o.   MRN: [id number]    This visit occurred during the SARS-CoV-2 public health emergency.  Safety protocols were in place, including screening questions prior to the visit, additional usage of staff PPE, and extensive cleaning of exam room while observing appropriate contact time as indicated for disinfecting solutions.   HPI  MOSSIE Russell is a 58 y.o.-year-old female, returning for follow-up for h/o subclinical hyperthyroidism, left thyroid nodule, and DM2, non-insulin-dependent.  Last visit 4 months ago.  Subclinical hyperthyroidism:  Patient was found to have a slightly low TSH at her first visit in the weight loss clinic in 01/2016.  Her TFTs normalized in the last 3 years.  Reviewed her TFTs: Lab Results  Component Value Date   TSH 0.63 09/20/2018   TSH 0.50 10/05/2017   TSH 0.52 04/04/2017   TSH 0.436 (L) 09/29/2016   TSH 0.443 (L) 05/09/2016   TSH 0.425 (L) 01/27/2016   FREET4 0.80 09/20/2018   FREET4 0.75 10/05/2017   FREET4 0.75 04/04/2017   FREET4 1.17 09/29/2016   FREET4 1.24 05/09/2016   FREET4 1.17 01/27/2016    Lab Results  Component Value Date   T3FREE 3.4 09/20/2018   T3FREE 3.9 10/05/2017   T3FREE 3.7 04/04/2017    She continues to experience: Hot flashes Palpitations with exertion Hair loss Chronic tremors (hereditary)   Pt does have a FH of thyroid ds. - M aunt. No FH of thyroid cancer. No h/o radiation tx to head or neck.  No seaweed or kelp. No recent contrast studies. No herbal supplements. No Biotin use. No recent steroids use.   Left thyroid nodule:  Reviewed previous investigation: 04/14/2017: 2.9 x 2 x 2.7 solid, hypoechoic, left thyroid nodule 05/11/2017: FNA of the left thyroid nodule: Benign  Pt denies: - feeling nodules in neck - hoarseness - dysphagia - choking - SOB with lying down  DM2:  Reviewed HbA1c levels: Lab Results  Component Value Date   HGBA1C 7.1 (A)  09/20/2018   HGBA1C 6.8 (H) 07/19/2018   HGBA1C 7.2 (H) 03/07/2018   HGBA1C 7.0 (A) 01/10/2018   HGBA1C 7.4 (H) 10/26/2017   HGBA1C 7.1 (A) 10/05/2017   HGBA1C 7.8 05/10/2017   HGBA1C 6.6 (H) 09/29/2016   HGBA1C 6.5 (H) 05/09/2016   HGBA1C 6.7 (H) 01/27/2016   She is on: - Metformin ER 1000 mg with dinner - Victoza 1.2 mg + 5 clicks in a.m. >> 1.8 mg daily - Glipizide 5-10 mg before dinner (not using the 10 mg dose frequently)  She checks her sugars twice a day per review of her meter download: - am: 138-166, 171 >> 150s >> 130-140 >> 138-162 >> 119, 141-166, 173 - 2h after b'fast: n/c - lunch: n/c >> 139 >> n/c - 2h after lunch: n/c - dinner: 125-177 >> 105, 115, 125 >> 89, 106. 115 >> 128-129, 158 - 2h after dinner: 160-180 >> 120-235, 240 >> 129, 154-218, 237 - bedtime: n/c Lowest: 105 >> 89 >> 96; hypoglycemia awareness in the 70s. Highest: 390 (40 mg Prednisone) >> 240 (cake) >> 237.  Meter: Accu-Chek  No CKD: Lab Results  Component Value Date   BUN 13 07/19/2018   Lab Results  Component Value Date   CREATININE 0.62 07/19/2018  On losartan.  + HL: Lab Results  Component Value Date   CHOL 173 07/19/2018   HDL 54 07/19/2018   LDLCALC 90 07/19/2018   TRIG 144 07/19/2018  On Lipitor 20.  Last eye exam: 09/2018: No DR, + glaucoma - returns in 03/2019 to recheck IO pressure.  She has HTN, asthma, vitamin D deficiency.  She also has hair loss and is seeing Dr. Lois Huxley, dermatologist at [facility].  She specializes in hair loss.  ROS: Constitutional: no weight gain/no weight loss, no fatigue, no subjective hyperthermia, no subjective hypothermia Eyes: no blurry vision, no xerophthalmia ENT: no sore throat, + see HPI Cardiovascular: no CP/no SOB/no palpitations/no leg swelling Respiratory: no cough/no SOB/no wheezing Gastrointestinal: no N/no V/no D/no C/no acid reflux Musculoskeletal: no muscle aches/no joint aches Skin: no rashes, + hair  loss Neurological: + tremors/no numbness/no tingling/no dizziness  I reviewed pt's medications, allergies, PMH, social hx, family hx, and changes were documented in the history of present illness. Otherwise, unchanged from my initial visit note.   Past Medical History:  Diagnosis Date  . Allergy   . Alopecia   . Asthma   . Back pain   . Blood transfusion without reported diagnosis    with hysterectomy   . Diabetes ([facility])   . Diabetes mellitus ([place])   . GERD (gastroesophageal reflux disease)   . 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 Hyperlipidemia   . Hypertension   . Joint pain   . Lactose intolerance   . Obesity   . Raynauds syndrome   . Swelling   . Tremor   . Vitamin D deficiency    Past Surgical History:  Procedure Laterality Date  . ABDOMINAL HYSTERECTOMY    . BREAST BIOPSY    . COLONOSCOPY    . MYOMECTOMY    . POLYPECTOMY     Social History   Socioeconomic History  . Marital status: Single    Spouse name: Not on file  . Number of children: no  Social Needs  Occupational History  . Occupation: [employer]    Employer: [employer]  Tobacco Use  . Smoking status: Never Smoker  . Smokeless tobacco: Never Used  Substance and Sexual Activity  . Alcohol use:  Wine, 2-3 times a year, 1 drink  . Drug use: No   Current Outpatient Medications on File Prior to Visit  Medication Sig Dispense Refill  . albuterol (VENTOLIN HFA) 108 (90 Base) MCG/ACT inhaler Inhale 2 puffs into the lungs every 6 (six) hours as needed for wheezing or shortness of breath. 1 Inhaler 0  . Alcohol Swabs (ALCOHOL WIPES) 70 % PADS 30 Packages by Does not apply route 2 (two) times daily. 100 each 0  . amLODipine (NORVASC) 5 MG tablet Take 5 mg by mouth daily.    Marland Kitchen atorvastatin (LIPITOR) 20 MG tablet Take 20 mg by mouth daily.    . Cholecalciferol 4000 units CAPS Take 4,000 Units by mouth daily.     Marland Kitchen glipiZIDE (GLUCOTROL) 5 MG tablet Take 1-2 tablets (5-10  mg total) by mouth daily before supper. 90 tablet 3  . glucose blood (ACCU-CHEK GUIDE) test strip Use twice daily 100 each prn  . hydrochlorothiazide (HYDRODIURIL) 25 MG tablet Take 25 mg by mouth every morning.  1  . Insulin Pen Needle (BD PEN NEEDLE NANO U/F) 32G X 4 MM MISC 1 Package by Does not apply route daily. 100 each prn  . Lancets (ACCU-CHEK SAFE-T PRO) lancets Use as instructed 60 each 0  . liraglutide (VICTOZA) 18 MG/3ML SOPN INJECT 1.8 MGS INTO THE SKIN EVERY MORNING. 9 mL 3  .  losartan (COZAAR) 50 MG tablet Take 50 mg by mouth daily.     . Melatonin 3 MG TABS Take 1 tablet by mouth at bedtime as needed.    . metFORMIN (GLUCOPHAGE-XR) 500 MG 24 hr tablet Take 2 tablets (1,000 mg total) by mouth at bedtime. 180 tablet 3  . montelukast (SINGULAIR) 10 MG tablet Take 10 mg by mouth at bedtime.    . propranolol (INDERAL) 20 MG tablet Take 20 mg by mouth daily.      No current facility-administered medications on file prior to visit.   Allergies  Allergen Reactions  . Erythromycin Nausea Only  . Milk-Related Compounds Other (See Comments)    Lactose intollerance   Family History  Problem Relation Age of Onset  . Multiple myeloma Mother   . Hypertension Mother   . Hyperlipidemia Mother   . Cancer Mother        multiple Myeloma in remission   . Obesity Mother   . Hypertension Father   . Hyperlipidemia Father   . Cancer Father   . Prostate cancer Father   . Asthma Other   . Hyperlipidemia Other   . Hypertension Other   . Cancer Other   . COPD Other   . Stroke Other   . Breast cancer Maternal Aunt   . Breast cancer Paternal Aunt   . Breast cancer Maternal Aunt   . Colon cancer Brother 37  . Colon polyps Brother   . Esophageal cancer Neg Hx   . Rectal cancer Neg Hx   . Stomach cancer Neg Hx     PE: BP 122/80   Pulse 95   Ht 5' 4" (1.626 m)   Wt 269 lb (122 kg)   LMP 08/31/2008   SpO2 95%   BMI 46.17 kg/m  Wt Readings from Last 3 Encounters:  01/21/19 269 lb  (122 kg)  01/15/19 264 lb (119.7 kg)  12/26/18 269 lb (122 kg)   Constitutional: overweight, in NAD Eyes: PERRLA, EOMI, no exophthalmos ENT: moist mucous membranes, no thyromegaly, no cervical lymphadenopathy Cardiovascular: tachycardia, RR, No MRG Respiratory: CTA B Gastrointestinal: abdomen soft, NT, ND, BS+ Musculoskeletal: no deformities, strength intact in all 4 Skin: moist, warm, no rashes Neurological:  L>R tremor with outstretched hands, DTR normal in all 4  ASSESSMENT: 1.  Mild subclinical thyrotoxicosis  2.  left thyroid nodule Thyroid ultrasound: FINDINGS: Parenchymal Echotexture: Mildly heterogenous Isthmus: 0.5 cm Right lobe: 4.5 x 2.1 x 1.3 cm Left lobe: 4.3 x 1.4 x 1.6 cm _________________________________________________________  Nodule # 1: Location: Right; Inferior Maximum size: 2.9 cm; Other 2 dimensions: 2.0 x 2.1 cm Composition: solid/almost completely solid (2) Echogenicity: hypoechoic (2) Shape: not taller-than-wide (0) Margins: smooth (0) Echogenic foci: none (0) ACR TI-RADS total points: 4. ACR TI-RADS risk category: TR4 (4-6 points).  ACR TI-RADS recommendations: **Given size (>/= 1.5 cm) and appearance, fine needle aspiration of this moderately suspicious nodule should be considered based on TI-RADS criteria.________________________________________________  Multiple other smaller nodules measure 0.7 cm or less in size and do not meet criteria for biopsy nor follow-up.  IMPRESSION: Right lower pole nodule 1 meets criteria for fine needle aspiration biopsy.  Electronically Signed By: Marybelle Killings M.D. On: 04/14/2017 08:17   FNA: Adequacy Reason Satisfactory For Evaluation. Diagnosis THYROID, FINE NEEDLE ASPIRATION, RLP (SPECIMEN 1 OF 1,COLLECTED 05/11/17): CONSISTENT WITH BENIGN FOLLICULAR NODULE (B[place] CATEGORY II). Enid Cutter MD Pathologist, Electronic Signature (Case signed 05/15/2017) Specimen Clinical Information Right  Inferior, 2.9cm; Other 2 dimensions: 2.0  x 2.1cm, solid/almost completely solid, hypoechoic, TI-RADS total points - 4, Moderately suspicious nodule Source Thyroid, Fine Needle Aspiration, Right, RLP (Specimen 1 of 1, collected on 05/11/17)  PLAN:  1. Patient with history of a slightly low TSH with normal free hormones (subclinical thyrotoxicosis) with thyrotoxic symptoms: Hot flashes (however, she is postmenopausal), palpitations with exertion, hair loss.  TFTs normalized at last check: Lab Results  Component Value Date   TSH 0.63 09/20/2018  -We will continue to follow her TFTs and if they start to worsen, we may need a thyroid uptake and scan -We will repeat them at next visit  2. L thyroid nodule -Initially felt on palpation -No neck compression symptoms -Reviewed the report of her most recent ultrasound from 03/2017 and this showed a 2.9 cm nodule, solid.  We biopsied the nodule in 05/2017 and the results were benign. -We will repeat her ultrasound at next visit  3. DM2 -Continues on Metformin, sulfonylurea and daily GLP-1 receptor agonist -She continues to be seen in the weight management clinic -In the last year, sugars improved after starting glipizide before dinner, however, they were higher at last visit after dietary indiscretions and we discussed about taking 2 glipizide tablets before a larger meal (A1c was 7.1%) -At this visit, reviewing her meter download, she is having dinner late, around 8:30 PM and her sugars at 10 30-11 PM are higher than target.  Upon questioning, she is not taking 10 mg of glipizide before these meals and we discussed about doing so.  However, I also advised her that if she needs to eat dinner late, she will need to return to light dinner.  In this case, she does not need to take 10 mg of glipizide before the meal.  I am hoping with that by increasing the sugars at bedtime her sugars in the morning will improve, as now, they are above target. - I suggested  to: Patient Instructions  Please continue: - Metformin ER 1000 mg with dinner - Victoza 1.8 mg daily  Please change: - Glipizide 5 mg before dinner but use 10 mg before a late or large dinner.  Please return in 4 months with your sugar log.   - we checked her HbA1c: 7.1% (stable) - advised to check sugars at different times of the day - 1-2x a day, rotating check times - advised for yearly eye exams >> she is UTD - return to clinic in 4 months  Philemon Kingdom, MD PhD [facility] Endocrinology

## 2019-01-21 NOTE — Addendum Note (Signed)
Addended by: Cardell Peach I on: 01/21/2019 09:24 AM   Modules accepted: Orders

## 2019-01-29 ENCOUNTER — Ambulatory Visit: Payer: 59 | Attending: Internal Medicine

## 2019-01-29 DIAGNOSIS — R238 Other skin changes: Secondary | ICD-10-CM

## 2019-01-29 DIAGNOSIS — U071 COVID-19: Secondary | ICD-10-CM | POA: Diagnosis not present

## 2019-01-31 LAB — NOVEL CORONAVIRUS, NAA: SARS-CoV-2, NAA: NOT DETECTED

## 2019-02-11 MED FILL — glipiZIDE 5 MG TABS: 5 | 45 days supply | Qty: 90 | Fill #2

## 2019-02-11 MED FILL — ATORVASTATIN 20 MG TABLET: 20 | 90 days supply | Qty: 90 | Fill #0

## 2019-02-12 ENCOUNTER — Other Ambulatory Visit: Payer: Self-pay

## 2019-02-12 ENCOUNTER — Ambulatory Visit (INDEPENDENT_AMBULATORY_CARE_PROVIDER_SITE_OTHER): Payer: 59 | Admitting: Physician Assistant

## 2019-02-12 VITALS — BP 132/82 | HR 100 | Temp 98.4°F | Ht 64.0 in | Wt 266.0 lb

## 2019-02-12 DIAGNOSIS — Z6841 Body Mass Index (BMI) 40.0 and over, adult: Secondary | ICD-10-CM | POA: Diagnosis not present

## 2019-02-12 DIAGNOSIS — E559 Vitamin D deficiency, unspecified: Secondary | ICD-10-CM

## 2019-02-12 DIAGNOSIS — E7849 Other hyperlipidemia: Secondary | ICD-10-CM | POA: Diagnosis not present

## 2019-02-12 DIAGNOSIS — E119 Type 2 diabetes mellitus without complications: Secondary | ICD-10-CM

## 2019-02-12 DIAGNOSIS — Z9189 Other specified personal risk factors, not elsewhere classified: Secondary | ICD-10-CM | POA: Diagnosis not present

## 2019-02-12 NOTE — Progress Notes (Signed)
Chief Complaint:   OBESITY Veronica Russell is here to discuss her progress with her obesity treatment plan along with follow-up of her obesity related diagnoses. Veronica Russell is on the Category 3 Plan and states she is following her eating plan approximately 60% of the time. Veronica Russell states she is not exercising.   Today's visit was #: 1 Starting weight: 259 lbs Starting date: 01/27/16 Today's weight: 266 lbs Today's date: 02/12/2019 Total lbs lost to date: 0 Total lbs lost since last in-office visit: 0  Interim History: Veronica Russell reports that she does well with breakfast and lunch but does not get all of her protein in at dinner. She may start working from home in the next few weeks.  Subjective:   1. Other hyperlipidemia Veronica Russell has hyperlipidemia and has been trying to improve her cholesterol levels with intensive lifestyle modification including a low saturated fat diet, exercise and weight loss. She is currently taking atorvastatin.  She denies any chest pain, claudication or myalgias. She is due for labs.  Lab Results  Component Value Date   ALT 32 07/19/2018   AST 17 07/19/2018   ALKPHOS 92 07/19/2018   BILITOT 0.4 07/19/2018   Lab Results  Component Value Date   CHOL 173 07/19/2018   HDL 54 07/19/2018   LDLCALC 90 07/19/2018   TRIG 144 07/19/2018    2. Type 2 diabetes mellitus without complication, without long-term current use of insulin (HCC) Medications reviewed, she is currently taking Victoza, glipizide, and metformin. Home glucose monitoring: fasting values range between 129-157, further diabetic ROS: no hypoglycemia, nausea, vomiting, and diarrhea.   Lab Results  Component Value Date   HGBA1C 7.1 (A) 01/21/2019   HGBA1C 7.1 (A) 09/20/2018   HGBA1C 6.8 (H) 07/19/2018   Lab Results  Component Value Date   LDLCALC 90 07/19/2018   CREATININE 0.62 07/19/2018    3. Vitamin D deficiency She's Vitamin D level was 55.4 on 07/19/18. She is currently taking vit D 4,000  IU daily. She denies nausea, vomiting or muscle weakness. She is due for labs.   4. At risk for heart disease Veronica Russell is at a higher than average risk for cardiovascular disease due to obesity. Reviewed: no chest pain on exertion, no dyspnea on exertion, and no swelling of ankles.  Assessment/Plan:   1. Other hyperlipidemia Cardiovascular risk and specific lipid/LDL goals reviewed.  We discussed several lifestyle modifications today and [name] will continue to work on diet, exercise and weight loss efforts. Orders and follow up as documented in patient record.   Counseling Intensive lifestyle modifications are the first line treatment for this issue. . Dietary changes: Increase soluble fiber. Decrease simple carbohydrates. . Exercise changes: Moderate to vigorous-intensity aerobic activity 150 minutes per week if tolerated. . Lipid-lowering medications: see documented in medical record. - Lipid Panel With LDL/HDL Ratio  2. Type 2 diabetes mellitus without complication, without long-term current use of insulin (HCC) Veronica Russell has been given diabetes education by myself today. Good blood sugar control is important to decrease the likelihood of diabetic complications such as nephropathy, neuropathy, limb loss, blindness, coronary artery disease, and death. Intensive lifestyle modification including diet, exercise and weight loss were discussed as the first line treatment for diabetes.  - Comprehensive Metabolic Panel (CMET) - HgB A1c - Insulin, random - Lipid Panel With LDL/HDL Ratio  3. Vitamin D deficiency Low Vitamin D level contributes to fatigue and are associated with obesity, breast, and colon cancer. She agrees to continue to  take vit D 4,000 IU daily and will follow-up for routine testing of vitamin D, at least 2-3 times per year to avoid over-replacement. - Vitamin D (25 hydroxy)  4. At risk for heart disease Veronica Russell was given approximately 15 minutes of coronary artery disease prevention  counseling today. She is 59 y.o. female and has risk factors for heart disease including obesity. We discussed intensive lifestyle modifications today with an emphasis on specific weight loss instructions and strategies.   5. Class 3 severe obesity with serious comorbidity and body mass index (BMI) of 45.0 to 49.9 in adult, unspecified obesity type [facility]) Veronica Russell is currently in the action stage of change. As such, her goal is to continue with weight loss efforts. She has agreed to on the Category 3 Plan.   We discussed the following exercise goals today: For substantial health benefits, adults should do at least 150 minutes (2 hours and 30 minutes) a week of moderate-intensity, or 75 minutes (1 hour and 15 minutes) a week of vigorous-intensity aerobic physical activity, or an equivalent combination of moderate- and vigorous-intensity aerobic activity. Aerobic activity should be performed in episodes of at least 10 minutes, and preferably, it should be spread throughout the week. Adults should also include muscle-strengthening activities that involve all major muscle groups on 2 or more days a week.  We discussed the following behavioral modification strategies today: increasing lean protein intake and no skipping meals.  Veronica Russell has agreed to follow-up with our clinic in 3-4 weeks. She was informed of the importance of frequent follow-up visits to maximize her success with intensive lifestyle modifications for her multiple health conditions.   Veronica Russell was informed we would discuss her lab results at her next visit unless there is a critical issue that needs to be addressed sooner. Veronica Russell agreed to keep her next visit at the agreed upon time to discuss these results.  Objective:   Blood pressure 132/82, pulse 100, temperature 98.4 F (36.9 C), temperature source Oral, height 5\' 4"  (1.626 m), weight 266 lb (120.7 kg), last menstrual period 08/31/2008, SpO2 97 %. Body mass index is 45.66  kg/m.  General: Cooperative, alert, well developed, in no acute distress. HEENT: Conjunctivae and lids unremarkable. Neck: No thyromegaly.  Cardiovascular: Regular rhythm.  Lungs: Normal work of breathing. Extremities: No edema.  Neurologic: No focal deficits.   Lab Results  Component Value Date   CREATININE 0.62 07/19/2018   BUN 13 07/19/2018   NA 144 07/19/2018   K 3.7 07/19/2018   CL 100 07/19/2018   CO2 26 07/19/2018   Lab Results  Component Value Date   ALT 32 07/19/2018   AST 17 07/19/2018   ALKPHOS 92 07/19/2018   BILITOT 0.4 07/19/2018   Lab Results  Component Value Date   HGBA1C 7.1 (A) 01/21/2019   HGBA1C 7.1 (A) 09/20/2018   HGBA1C 6.8 (H) 07/19/2018   HGBA1C 7.2 (H) 03/07/2018   HGBA1C 7.0 (A) 01/10/2018   Lab Results  Component Value Date   INSULIN 42.1 (H) 07/19/2018   INSULIN 43.1 (H) 03/07/2018   INSULIN 56.5 (H) 10/26/2017   INSULIN 39.0 (H) 09/29/2016   INSULIN 38.5 (H) 05/09/2016   Lab Results  Component Value Date   TSH 0.63 09/20/2018   Lab Results  Component Value Date   CHOL 173 07/19/2018   HDL 54 07/19/2018   LDLCALC 90 07/19/2018   TRIG 144 07/19/2018   Lab Results  Component Value Date   WBC 6.4 03/15/2018  HGB 14.1 03/15/2018   HCT 44.4 03/15/2018   MCV 89.0 03/15/2018   PLT 216 03/15/2018    Attestation Statements:   Reviewed by clinician on day of visit: allergies, medications, problem list, medical history, surgical history, family history, social history, and previous encounter notes.  I, Renee Ramus, am acting as Location manager for [employer], PA-C.  I have reviewed the above documentation for accuracy and completeness, and I agree with the above. Abby Potash, PA-C

## 2019-02-13 LAB — LIPID PANEL WITH LDL/HDL RATIO
Cholesterol, Total: 176 mg/dL (ref 100–199)
HDL: 51 mg/dL (ref >39)
LDL Chol Calc (NIH): 99 mg/dL (ref 0–99)
LDL/HDL Ratio: 1.9 ratio (ref 0.0–3.2)
Triglycerides: 148 mg/dL (ref 0–149)
VLDL Cholesterol Cal: 26 mg/dL (ref 5–40)

## 2019-02-13 LAB — COMPREHENSIVE METABOLIC PANEL
ALT: 33 IU/L — ABNORMAL HIGH (ref 0–32)
AST: 18 IU/L (ref 0–40)
Albumin/Globulin Ratio: 1.5 (ref 1.2–2.2)
Albumin: 4.2 g/dL (ref 3.8–4.9)
Alkaline Phosphatase: 100 IU/L (ref 39–117)
BUN/Creatinine Ratio: 21 (ref 9–23)
BUN: 15 mg/dL (ref 6–24)
Bilirubin Total: 0.4 mg/dL (ref 0.0–1.2)
CO2: 27 mmol/L (ref 20–29)
Calcium: 9.8 mg/dL (ref 8.7–10.2)
Chloride: 99 mmol/L (ref 96–106)
Creatinine, Ser: 0.73 mg/dL (ref 0.57–1.00)
GFR calc Af Amer: 104 mL/min/{1.73_m2} (ref >59)
GFR calc non Af Amer: 90 mL/min/{1.73_m2} (ref >59)
Globulin, Total: 2.8 g/dL (ref 1.5–4.5)
Glucose: 150 mg/dL — ABNORMAL HIGH (ref 65–99)
Potassium: 3.6 mmol/L (ref 3.5–5.2)
Sodium: 142 mmol/L (ref 134–144)
Total Protein: 7 g/dL (ref 6.0–8.5)

## 2019-02-13 LAB — VITAMIN D 25 HYDROXY (VIT D DEFICIENCY, FRACTURES): Vit D, 25-Hydroxy: 58.7 ng/mL (ref 30.0–100.0)

## 2019-02-13 LAB — HEMOGLOBIN A1C
Est. average glucose Bld gHb Est-mCnc: 171 mg/dL
Hgb A1c MFr Bld: 7.6 % — ABNORMAL HIGH (ref 4.8–5.6)

## 2019-02-13 LAB — INSULIN, RANDOM: INSULIN: 43.2 u[IU]/mL — ABNORMAL HIGH (ref 2.6–24.9)

## 2019-02-14 MED FILL — VICTOZA 18 MG/3 ML INJECT P: 18 | 30 days supply | Qty: 9 | Fill #2

## 2019-02-25 MED FILL — MONTELUKAST SOD 10 MG TAB: 10 | 90 days supply | Qty: 90 | Fill #1

## 2019-02-25 MED FILL — LOSARTAN POTASSIUM 50 MG TA: 50 | 90 days supply | Qty: 90 | Fill #1

## 2019-02-25 MED FILL — OMRON 3 SERIES BP MONITOR D: 30 days supply | Qty: 1 | Fill #0

## 2019-02-26 ENCOUNTER — Encounter: Payer: Self-pay | Admitting: Internal Medicine

## 2019-02-27 MED ORDER — FREESTYLE LANCETS MISC: 12 refills | Status: AC

## 2019-02-27 MED ORDER — FREESTYLE FREEDOM LITE W/DEVICE KIT: PACK | 0 refills | Status: AC

## 2019-02-27 MED ORDER — FREESTYLE LITE TEST VI STRP: ORAL_STRIP | 12 refills | Status: DC

## 2019-02-27 MED FILL — FREESTYLE FREEDOM LITE METE: W/DEVICE | 1 days supply | Qty: 1 | Fill #0

## 2019-02-27 MED FILL — OMEPRAZOLE DR 20 MG CAPSULE: 20 | 90 days supply | Qty: 90 | Fill #0

## 2019-02-27 MED FILL — FREESTYLE LITE TEST STRIP: 50 days supply | Qty: 100 | Fill #0

## 2019-02-27 MED FILL — FREESTYLE LANCETS: 50 days supply | Qty: 100 | Fill #0

## 2019-03-12 DIAGNOSIS — H5213 Myopia, bilateral: Secondary | ICD-10-CM | POA: Diagnosis not present

## 2019-03-12 DIAGNOSIS — H40013 Open angle with borderline findings, low risk, bilateral: Secondary | ICD-10-CM | POA: Diagnosis not present

## 2019-03-12 DIAGNOSIS — H524 Presbyopia: Secondary | ICD-10-CM | POA: Diagnosis not present

## 2019-03-12 DIAGNOSIS — H40053 Ocular hypertension, bilateral: Secondary | ICD-10-CM | POA: Diagnosis not present

## 2019-03-12 DIAGNOSIS — H52223 Regular astigmatism, bilateral: Secondary | ICD-10-CM | POA: Diagnosis not present

## 2019-03-18 ENCOUNTER — Ambulatory Visit (INDEPENDENT_AMBULATORY_CARE_PROVIDER_SITE_OTHER): Payer: 59 | Admitting: Physician Assistant

## 2019-03-18 ENCOUNTER — Encounter (INDEPENDENT_AMBULATORY_CARE_PROVIDER_SITE_OTHER): Payer: Self-pay | Admitting: Physician Assistant

## 2019-03-18 ENCOUNTER — Other Ambulatory Visit: Payer: Self-pay

## 2019-03-18 ENCOUNTER — Ambulatory Visit (INDEPENDENT_AMBULATORY_CARE_PROVIDER_SITE_OTHER): Payer: 59 | Admitting: Family Medicine

## 2019-03-18 VITALS — BP 131/84 | HR 98 | Temp 98.3°F | Ht 64.0 in | Wt 268.0 lb

## 2019-03-18 DIAGNOSIS — Z6841 Body Mass Index (BMI) 40.0 and over, adult: Secondary | ICD-10-CM | POA: Diagnosis not present

## 2019-03-18 DIAGNOSIS — E119 Type 2 diabetes mellitus without complications: Secondary | ICD-10-CM

## 2019-03-18 NOTE — Progress Notes (Signed)
Chief Complaint:   OBESITY Veronica Russell is here to discuss her progress with her obesity treatment plan along with follow-up of her obesity related diagnoses. Veronica Russell is on the Category 3 Plan and states she is following her eating plan approximately 60% of the time. Veronica Russell states she is exercising 0 minutes 0 times per week.  Today's visit was #: 35 Starting weight: 259 lbs Starting date: 01/27/2016 Today's weight: 268 lbs Today's date: 03/18/2019 Total lbs lost to date: 0 Total lbs lost since last in-office visit: 0  Interim History: Veronica Russell reports that she has been doing some stress eating recently. She is not eating enough protein.   Subjective:   Type 2 diabetes mellitus without complication, without long-term current use of insulin (Veronica Russell). Veronica Russell is on metformin, glipizide, and Victoza. No nausea, vomiting, or diarrhea. No polyphagia. Fasting blood sugars range between 97 and 157.   Lab Results  Component Value Date   HGBA1C 7.6 (H) 02/12/2019   HGBA1C 7.1 (A) 01/21/2019   HGBA1C 7.1 (A) 09/20/2018   Lab Results  Component Value Date   LDLCALC 99 02/12/2019   CREATININE 0.73 02/12/2019   Lab Results  Component Value Date   INSULIN 43.2 (H) 02/12/2019   INSULIN 42.1 (H) 07/19/2018   INSULIN 43.1 (H) 03/07/2018   INSULIN 56.5 (H) 10/26/2017   INSULIN 39.0 (H) 09/29/2016   Assessment/Plan:   Type 2 diabetes mellitus without complication, without long-term current use of insulin (Veronica Russell). Good blood sugar control is important to decrease the likelihood of diabetic complications such as nephropathy, neuropathy, limb loss, blindness, coronary artery disease, and death. Intensive lifestyle modification including diet, exercise and weight loss are the first line of treatment for diabetes. Veronica Russell will continue her medications as prescribed.  Class 3 severe obesity with serious comorbidity and body mass index (BMI) of 45.0 to 49.9 in adult, unspecified obesity type  (Veronica Russell).  Veronica Russell is currently in the action stage of change. As such, her goal is to continue with weight loss efforts. She has agreed to the Category 3 Plan.   Exercise goals: For substantial health benefits, adults should do at least 150 minutes (2 hours and 30 minutes) a week of moderate-intensity, or 75 minutes (1 hour and 15 minutes) a week of vigorous-intensity aerobic physical activity, or an equivalent combination of moderate- and vigorous-intensity aerobic activity. Aerobic activity should be performed in episodes of at least 10 minutes, and preferably, it should be spread throughout the week.  Behavioral modification strategies: increasing lean protein intake and decreasing simple carbohydrates.  Veronica Russell has agreed to follow-up with our clinic in 3 weeks. She was informed of the importance of frequent follow-up visits to maximize her success with intensive lifestyle modifications for her multiple health conditions.   Objective:   Blood pressure 131/84, pulse 98, temperature 98.3 F (36.8 C), temperature source Oral, height 5\' 4"  (1.626 m), weight 268 lb (121.6 kg), last menstrual period 08/31/2008, SpO2 100 %. Body mass index is 46 kg/m.  General: Cooperative, alert, well developed, in no acute distress. HEENT: Conjunctivae and lids unremarkable. Cardiovascular: Regular rhythm.  Lungs: Normal work of breathing. Neurologic: No focal deficits.   Lab Results  Component Value Date   CREATININE 0.73 02/12/2019   BUN 15 02/12/2019   NA 142 02/12/2019   K 3.6 02/12/2019   CL 99 02/12/2019   CO2 27 02/12/2019   Lab Results  Component Value Date   ALT 33 (H) 02/12/2019   AST 18 02/12/2019  ALKPHOS 100 02/12/2019   BILITOT 0.4 02/12/2019   Lab Results  Component Value Date   HGBA1C 7.6 (H) 02/12/2019   HGBA1C 7.1 (A) 01/21/2019   HGBA1C 7.1 (A) 09/20/2018   HGBA1C 6.8 (H) 07/19/2018   HGBA1C 7.2 (H) 03/07/2018   Lab Results  Component Value Date   INSULIN 43.2 (H)  02/12/2019   INSULIN 42.1 (H) 07/19/2018   INSULIN 43.1 (H) 03/07/2018   INSULIN 56.5 (H) 10/26/2017   INSULIN 39.0 (H) 09/29/2016   Lab Results  Component Value Date   TSH 0.63 09/20/2018   Lab Results  Component Value Date   CHOL 176 02/12/2019   HDL 51 02/12/2019   LDLCALC 99 02/12/2019   TRIG 148 02/12/2019   Lab Results  Component Value Date   WBC 6.4 03/15/2018   HGB 14.1 03/15/2018   HCT 44.4 03/15/2018   MCV 89.0 03/15/2018   PLT 216 03/15/2018   No results found for: IRON, TIBC, FERRITIN  Attestation Statements:   Reviewed by clinician on day of visit: allergies, medications, problem list, medical history, surgical history, family history, social history, and previous encounter notes.  Time spent on visit including pre-visit chart review and post-visit care was 31 minutes.   IMichaelene Song, am acting as transcriptionist for Veronica Potash, Veronica Russell   I have reviewed the above documentation for accuracy and completeness, and I agree with the above. Veronica Potash, Veronica Russell

## 2019-03-19 MED FILL — VICTOZA 18 MG/3 ML INJECT P: 18 | 30 days supply | Qty: 9 | Fill #3

## 2019-03-19 MED FILL — AMLODIPINE BESYLATE 5 MG TA: 5 | 90 days supply | Qty: 90 | Fill #1

## 2019-03-19 MED FILL — HYDROCHLOROTHIAZIDE 25 MG T: 25 | 90 days supply | Qty: 90 | Fill #1

## 2019-03-25 MED FILL — METFORMIN HCL ER 500 MG TB2: 500 | 90 days supply | Qty: 180 | Fill #0

## 2019-04-15 MED FILL — FREESTYLE LITE TEST STRIP: 50 days supply | Qty: 100 | Fill #1

## 2019-04-16 ENCOUNTER — Other Ambulatory Visit: Payer: Self-pay

## 2019-04-16 ENCOUNTER — Ambulatory Visit (INDEPENDENT_AMBULATORY_CARE_PROVIDER_SITE_OTHER): Payer: 59 | Admitting: Physician Assistant

## 2019-04-16 ENCOUNTER — Encounter (INDEPENDENT_AMBULATORY_CARE_PROVIDER_SITE_OTHER): Payer: Self-pay | Admitting: Physician Assistant

## 2019-04-16 VITALS — BP 120/79 | HR 93 | Temp 98.4°F | Ht 64.0 in | Wt 269.0 lb

## 2019-04-16 DIAGNOSIS — E7849 Other hyperlipidemia: Secondary | ICD-10-CM | POA: Diagnosis not present

## 2019-04-16 DIAGNOSIS — Z6841 Body Mass Index (BMI) 40.0 and over, adult: Secondary | ICD-10-CM | POA: Diagnosis not present

## 2019-04-16 DIAGNOSIS — Z9189 Other specified personal risk factors, not elsewhere classified: Secondary | ICD-10-CM | POA: Diagnosis not present

## 2019-04-16 DIAGNOSIS — Z794 Long term (current) use of insulin: Secondary | ICD-10-CM | POA: Diagnosis not present

## 2019-04-16 DIAGNOSIS — E119 Type 2 diabetes mellitus without complications: Secondary | ICD-10-CM

## 2019-04-16 MED ORDER — VICTOZA 18 MG/3ML ~~LOC~~ SOPN: PEN_INJECTOR | SUBCUTANEOUS | 0 refills | Status: DC

## 2019-04-16 MED FILL — VICTOZA 18 MG/3 ML INJECT P: 18 | 30 days supply | Qty: 9 | Fill #0

## 2019-04-16 NOTE — Progress Notes (Signed)
Chief Complaint:   OBESITY Veronica Russell is here to discuss her progress with her obesity treatment plan along with follow-up of her obesity related diagnoses. Veronica Russell is on the Category 3 Plan and states she is following her eating plan approximately 60% of the time. Veronica Russell states she is exercising 0 minutes 0 times per week.  Today's visit was #: 39 Starting weight: 259 lbs Starting date: 01/27/2016 Today's weight: 269 lbs  Today's date: 04/16/2019 Total lbs lost to date: 0 Total lbs lost since last in-office visit: 0  Interim History: Veronica Russell states that weekends are difficult because she is out of routine. She is not eating enough throughout the day.  Subjective:   Type 2 diabetes mellitus without complication, with long-term current use of insulin ([place]). Fasting blood sugars range between 99 and 153. Veronica Russell is on Glipizide, Victoza, and metformin.  Lab Results  Component Value Date   HGBA1C 7.6 (H) 02/12/2019   HGBA1C 7.1 (A) 01/21/2019   HGBA1C 7.1 (A) 09/20/2018   Lab Results  Component Value Date   LDLCALC 99 02/12/2019   CREATININE 0.73 02/12/2019   Lab Results  Component Value Date   INSULIN 43.2 (H) 02/12/2019   INSULIN 42.1 (H) 07/19/2018   INSULIN 43.1 (H) 03/07/2018   INSULIN 56.5 (H) 10/26/2017   INSULIN 39.0 (H) 09/29/2016   Other hyperlipidemia. Veronica Russell is on atorvastatin. No chest pain. No myalgias.  Lab Results  Component Value Date   CHOL 176 02/12/2019   HDL 51 02/12/2019   LDLCALC 99 02/12/2019   TRIG 148 02/12/2019   Lab Results  Component Value Date   ALT 33 (H) 02/12/2019   AST 18 02/12/2019   ALKPHOS 100 02/12/2019   BILITOT 0.4 02/12/2019   The 10-year ASCVD risk score Mikey Bussing DC Jr., et al., 2013) is: 11.4%   Values used to calculate the score:     Age: 55 years     Sex: Female     Is Non-Hispanic African American: Yes     Diabetic: Yes     Tobacco smoker: No     Systolic Blood Pressure: 123456 mmHg     Is BP treated: Yes     HDL  Cholesterol: 51 mg/dL     Total Cholesterol: 176 mg/dL  At risk for heart disease. Veronica Russell is at a higher than average risk for cardiovascular disease due to obesity. Reviewed: no chest pain on exertion, no dyspnea on exertion, and no swelling of ankles.  Assessment/Plan:   Type 2 diabetes mellitus without complication, with long-term current use of insulin ([place]). Good blood sugar control is important to decrease the likelihood of diabetic complications such as nephropathy, neuropathy, limb loss, blindness, coronary artery disease, and death. Intensive lifestyle modification including diet, exercise and weight loss are the first line of treatment for diabetes. Veronica Russell was given a refill on her liraglutide (VICTOZA) 18 MG/3ML SOPN #3 with 0 refills.  Other hyperlipidemia. Cardiovascular risk and specific lipid/LDL goals reviewed.  We discussed several lifestyle modifications today and Veronica Russell will continue to work on diet, exercise and weight loss efforts. Orders and follow up as documented in patient record. Chance will continue her medications as directed.  Counseling Intensive lifestyle modifications are the first line treatment for this issue.  Dietary changes: Increase soluble fiber. Decrease simple carbohydrates.  Exercise changes: Moderate to vigorous-intensity aerobic activity 150 minutes per week if tolerated.  Lipid-lowering medications: see documented in medical record.  At risk for heart disease. Veronica Russell  was given approximately 15 minutes of coronary artery disease prevention counseling today. She is 59 y.o. female and has risk factors for heart disease including obesity. We discussed intensive lifestyle modifications today with an emphasis on specific weight loss instructions and strategies.   Repetitive spaced learning was employed today to elicit superior memory formation and behavioral change.  Class 3 severe obesity with serious comorbidity and body mass index (BMI) of 45.0 to 49.9 in  adult, unspecified obesity type ([place]).  Veronica Russell is currently in the action stage of change. As such, her goal is to continue with weight loss efforts. She has agreed to the Category 3 Plan + 100 protein calories.   Exercise goals: For substantial health benefits, adults should do at least 150 minutes (2 hours and 30 minutes) a week of moderate-intensity, or 75 minutes (1 hour and 15 minutes) a week of vigorous-intensity aerobic physical activity, or an equivalent combination of moderate- and vigorous-intensity aerobic activity. Aerobic activity should be performed in episodes of at least 10 minutes, and preferably, it should be spread throughout the week.  Behavioral modification strategies: no skipping meals and meal planning and cooking strategies.  Veronica Russell has agreed to follow-up with our clinic in 3 weeks. She was informed of the importance of frequent follow-up visits to maximize her success with intensive lifestyle modifications for her multiple health conditions.   Objective:   Blood pressure 120/79, pulse 93, temperature 98.4 F (36.9 C), temperature source Oral, height 5\' 4"  (1.626 m), weight 269 lb (122 kg), last menstrual period 08/31/2008, SpO2 99 %. Body mass index is 46.17 kg/m.  General: Cooperative, alert, well developed, in no acute distress. HEENT: Conjunctivae and lids unremarkable. Cardiovascular: Regular rhythm.  Lungs: Normal work of breathing. Neurologic: No focal deficits.   Lab Results  Component Value Date   CREATININE 0.73 02/12/2019   BUN 15 02/12/2019   NA 142 02/12/2019   K 3.6 02/12/2019   CL 99 02/12/2019   CO2 27 02/12/2019   Lab Results  Component Value Date   ALT 33 (H) 02/12/2019   AST 18 02/12/2019   ALKPHOS 100 02/12/2019   BILITOT 0.4 02/12/2019   Lab Results  Component Value Date   HGBA1C 7.6 (H) 02/12/2019   HGBA1C 7.1 (A) 01/21/2019   HGBA1C 7.1 (A) 09/20/2018   HGBA1C 6.8 (H) 07/19/2018   HGBA1C 7.2 (H) 03/07/2018   Lab Results    Component Value Date   INSULIN 43.2 (H) 02/12/2019   INSULIN 42.1 (H) 07/19/2018   INSULIN 43.1 (H) 03/07/2018   INSULIN 56.5 (H) 10/26/2017   INSULIN 39.0 (H) 09/29/2016   Lab Results  Component Value Date   TSH 0.63 09/20/2018   Lab Results  Component Value Date   CHOL 176 02/12/2019   HDL 51 02/12/2019   LDLCALC 99 02/12/2019   TRIG 148 02/12/2019   Lab Results  Component Value Date   WBC 6.4 03/15/2018   HGB 14.1 03/15/2018   HCT 44.4 03/15/2018   MCV 89.0 03/15/2018   PLT 216 03/15/2018   No results found for: IRON, TIBC, FERRITIN  Attestation Statements:   Reviewed by clinician on day of visit: allergies, medications, problem list, medical history, surgical history, family history, social history, and previous encounter notes.  IMichaelene Russell, am acting as transcriptionist for Abby Potash, PA-C   I have reviewed the above documentation for accuracy and completeness, and I agree with the above. Abby Potash, PA-C

## 2019-05-06 MED FILL — ATORVASTATIN 20 MG TABLET: 20 | 90 days supply | Qty: 90 | Fill #1

## 2019-05-07 ENCOUNTER — Other Ambulatory Visit: Payer: Self-pay

## 2019-05-07 ENCOUNTER — Encounter (INDEPENDENT_AMBULATORY_CARE_PROVIDER_SITE_OTHER): Payer: Self-pay | Admitting: Physician Assistant

## 2019-05-07 ENCOUNTER — Ambulatory Visit (INDEPENDENT_AMBULATORY_CARE_PROVIDER_SITE_OTHER): Payer: 59 | Admitting: Physician Assistant

## 2019-05-07 VITALS — BP 130/85 | HR 92 | Temp 98.5°F | Ht 64.0 in | Wt 270.0 lb

## 2019-05-07 DIAGNOSIS — Z6841 Body Mass Index (BMI) 40.0 and over, adult: Secondary | ICD-10-CM | POA: Diagnosis not present

## 2019-05-07 DIAGNOSIS — E66813 Obesity, class 3: Secondary | ICD-10-CM

## 2019-05-07 DIAGNOSIS — E119 Type 2 diabetes mellitus without complications: Secondary | ICD-10-CM

## 2019-05-07 MED FILL — PROPRANOLOL 20 MG TABLET: 20 | 90 days supply | Qty: 90 | Fill #0

## 2019-05-07 NOTE — Progress Notes (Signed)
Chief Complaint:   Veronica Russell is here to discuss her progress with her Veronica treatment plan along with follow-up of her Veronica related diagnoses. Veronica Russell is on the Category 3 Plan and states she is following her eating plan approximately 60% of the time. [name] states she is exercising 0 minutes 0 times per week.  Today's visit was #: 28 Starting weight: 259 lbs Starting date: 01/27/2016 Today's weight: 270 lbs Today's date: 05/07/2019 Total lbs lost to date: 0 Total lbs lost since last in-office visit: 0  Interim History: Veronica Russell states that her eating was "off" with the Easter holiday. She is not eating enough protein for dinner as she does not feel like cooking when she gets home.  Subjective:   Type 2 diabetes mellitus without complication, without long-term current use of insulin ([place]). Fasting blood sugars range between 98 and 170. Veronica Russell is on glipizide and Victoza. No hypoglycemia.   Lab Results  Component Value Date   HGBA1C 7.6 (H) 02/12/2019   HGBA1C 7.1 (A) 01/21/2019   HGBA1C 7.1 (A) 09/20/2018   Lab Results  Component Value Date   LDLCALC 99 02/12/2019   CREATININE 0.73 02/12/2019   Lab Results  Component Value Date   INSULIN 43.2 (H) 02/12/2019   INSULIN 42.1 (H) 07/19/2018   INSULIN 43.1 (H) 03/07/2018   INSULIN 56.5 (H) 10/26/2017   INSULIN 39.0 (H) 09/29/2016   Assessment/Plan:   Type 2 diabetes mellitus without complication, without long-term current use of insulin ([place]). Good blood sugar control is important to decrease the likelihood of diabetic complications such as nephropathy, neuropathy, limb loss, blindness, coronary artery disease, and death. Intensive lifestyle modification including diet, exercise and weight loss are the first line of treatment for diabetes. [name] will continue her medications as directed.  Class 3 severe Veronica with serious comorbidity and body mass index (BMI) of 45.0 to 49.9 in adult, unspecified Veronica type  ([place]).  Veronica Russell is currently in the action stage of change. As such, her goal is to continue with weight loss efforts. She has agreed to the Category 3 Plan. She will eat dinner portion for lunch and lunch portion for dinner.  Exercise goals: For substantial health benefits, adults should do at least 150 minutes (2 hours and 30 minutes) a week of moderate-intensity, or 75 minutes (1 hour and 15 minutes) a week of vigorous-intensity aerobic physical activity, or an equivalent combination of moderate- and vigorous-intensity aerobic activity. Aerobic activity should be performed in episodes of at least 10 minutes, and preferably, it should be spread throughout the week.  Behavioral modification strategies: increasing lean protein intake and meal planning and cooking strategies.  Veronica Russell has agreed to follow-up with our clinic in 3-4 weeks. She was informed of the importance of frequent follow-up visits to maximize her success with intensive lifestyle modifications for her multiple health conditions.   Objective:   Blood pressure 130/85, pulse 92, temperature 98.5 F (36.9 C), temperature source Oral, height 5\' 4"  (1.626 m), weight 270 lb (122.5 kg), last menstrual period 08/31/2008, SpO2 98 %. Body mass index is 46.35 kg/m.  General: Cooperative, alert, well developed, in no acute distress. HEENT: Conjunctivae and lids unremarkable. Cardiovascular: Regular rhythm.  Lungs: Normal work of breathing. Neurologic: No focal deficits.   Lab Results  Component Value Date   CREATININE 0.73 02/12/2019   BUN 15 02/12/2019   NA 142 02/12/2019   K 3.6 02/12/2019   CL 99 02/12/2019   CO2 27 02/12/2019  Lab Results  Component Value Date   ALT 33 (H) 02/12/2019   AST 18 02/12/2019   ALKPHOS 100 02/12/2019   BILITOT 0.4 02/12/2019   Lab Results  Component Value Date   HGBA1C 7.6 (H) 02/12/2019   HGBA1C 7.1 (A) 01/21/2019   HGBA1C 7.1 (A) 09/20/2018   HGBA1C 6.8 (H) 07/19/2018   HGBA1C 7.2 (H)  03/07/2018   Lab Results  Component Value Date   INSULIN 43.2 (H) 02/12/2019   INSULIN 42.1 (H) 07/19/2018   INSULIN 43.1 (H) 03/07/2018   INSULIN 56.5 (H) 10/26/2017   INSULIN 39.0 (H) 09/29/2016   Lab Results  Component Value Date   TSH 0.63 09/20/2018   Lab Results  Component Value Date   CHOL 176 02/12/2019   HDL 51 02/12/2019   LDLCALC 99 02/12/2019   TRIG 148 02/12/2019   Lab Results  Component Value Date   WBC 6.4 03/15/2018   HGB 14.1 03/15/2018   HCT 44.4 03/15/2018   MCV 89.0 03/15/2018   PLT 216 03/15/2018   No results found for: IRON, TIBC, FERRITIN  Attestation Statements:   Reviewed by clinician on day of visit: allergies, medications, problem list, medical history, surgical history, family history, social history, and previous encounter notes.  Time spent on visit including pre-visit chart review and post-visit charting and care was 30 minutes.   IMichaelene Song, am acting as transcriptionist for Abby Potash, PA-C   I have reviewed the above documentation for accuracy and completeness, and I agree with the above. Abby Potash, PA-C

## 2019-05-23 ENCOUNTER — Other Ambulatory Visit: Payer: Self-pay | Admitting: Internal Medicine

## 2019-05-23 ENCOUNTER — Ambulatory Visit: Payer: 59 | Admitting: Internal Medicine

## 2019-05-23 ENCOUNTER — Other Ambulatory Visit: Payer: Self-pay

## 2019-05-23 ENCOUNTER — Encounter: Payer: Self-pay | Admitting: Internal Medicine

## 2019-05-23 VITALS — BP 120/70 | HR 86 | Ht 64.0 in | Wt 272.0 lb

## 2019-05-23 DIAGNOSIS — E1165 Type 2 diabetes mellitus with hyperglycemia: Secondary | ICD-10-CM

## 2019-05-23 DIAGNOSIS — E0590 Thyrotoxicosis, unspecified without thyrotoxic crisis or storm: Secondary | ICD-10-CM | POA: Diagnosis not present

## 2019-05-23 DIAGNOSIS — E042 Nontoxic multinodular goiter: Secondary | ICD-10-CM | POA: Diagnosis not present

## 2019-05-23 LAB — POCT GLYCOSYLATED HEMOGLOBIN (HGB A1C): Hemoglobin A1C: 7.3 % — AB (ref 4.0–5.6)

## 2019-05-23 MED ORDER — OZEMPIC (1 MG/DOSE) 4 MG/3ML ~~LOC~~ SOPN: 1.0000 mg | PEN_INJECTOR | SUBCUTANEOUS | 3 refills | Status: DC

## 2019-05-23 MED FILL — OZEMPIC 1 MG/DOSE SOPN: 2 | 28 days supply | Qty: 3 | Fill #0

## 2019-05-23 MED FILL — OMEPRAZOLE DR 20 MG CAPSULE: 20 | 90 days supply | Qty: 90 | Fill #1

## 2019-05-23 NOTE — Addendum Note (Signed)
Addended by: Cardell Peach I on: 05/23/2019 11:22 AM   Modules accepted: Orders

## 2019-05-23 NOTE — Patient Instructions (Addendum)
Please continue: - Metformin ER 1000 mg with dinner - Victoza 1.8 mg daily (try to switch to Ozempic 1 mg weekly) - Glipizide 5 mg-10 mg before a late or large dinner.  Please return in 4 months with your sugar log.

## 2019-05-23 NOTE — Progress Notes (Addendum)
Patient ID: Veronica Russell, female   DOB: 03-28-1960, 59 y.o.   MRN: [id number]    This visit occurred during the SARS-CoV-2 public health emergency.  Safety protocols were in place, including screening questions prior to the visit, additional usage of staff PPE, and extensive cleaning of exam room while observing appropriate contact time as indicated for disinfecting solutions.   HPI  Veronica Russell is a 59 y.o.-year-old female, returning for follow-up for h/o subclinical hyperthyroidism, left thyroid nodule, and DM2, non-insulin-dependent.  Last visit 4 months ago.  Subclinical hyperthyroidism:  Patient was found to have a slightly low TSH at her first visit in the weight loss clinic in 01/2016.  Her TFTs normalized after 2018.  Reviewed her TFTs: Lab Results  Component Value Date   TSH 0.63 09/20/2018   TSH 0.50 10/05/2017   TSH 0.52 04/04/2017   TSH 0.436 (L) 09/29/2016   TSH 0.443 (L) 05/09/2016   TSH 0.425 (L) 01/27/2016   FREET4 0.80 09/20/2018   FREET4 0.75 10/05/2017   FREET4 0.75 04/04/2017   FREET4 1.17 09/29/2016   FREET4 1.24 05/09/2016   FREET4 1.17 01/27/2016    Lab Results  Component Value Date   T3FREE 3.4 09/20/2018   T3FREE 3.9 10/05/2017   T3FREE 3.7 04/04/2017    She still has: Hot flashes Palpitations with exertion Hair loss Chronic tremors (hereditary)   Pt does have a FH of thyroid ds. - M aunt. No FH of thyroid cancer. No h/o radiation tx to head or neck.  No recent contrast studies. No herbal supplements. No Biotin use. No recent steroids use.   Left thyroid nodule:  Reviewed previous investigation: 04/14/2017: 2.9 x 2 x 2.7 solid, hypoechoic, left thyroid nodule 05/11/2017: FNA of the left thyroid nodule: Benign  Pt denies: - feeling nodules in neck - hoarseness - dysphagia - choking - SOB with lying down  DM2:  Reviewed HbA1c levels: Lab Results  Component Value Date   HGBA1C 7.6 (H) 02/12/2019   HGBA1C 7.1 (A) 01/21/2019    HGBA1C 7.1 (A) 09/20/2018   HGBA1C 6.8 (H) 07/19/2018   HGBA1C 7.2 (H) 03/07/2018   HGBA1C 7.0 (A) 01/10/2018   HGBA1C 7.4 (H) 10/26/2017   HGBA1C 7.1 (A) 10/05/2017   HGBA1C 7.8 05/10/2017   HGBA1C 6.6 (H) 09/29/2016   She is on: - Metformin ER 1000 mg with dinner - Victoza 1.2 mg + 5 clicks in a.m. >> 1.8 mg daily - Glipizide 5 to 10 mg but she takes it after dinner!  She checks her sugars twice a day per review of her meter download: - am:  138-162 >> 119, 141-166, 173 >> 113-172 - 2h after b'fast: n/c - lunch: n/c >> 139 >> n/c - 2h after lunch: n/c - dinner: 89, 106. 115 >> 128-129, 158 >> 103-163, 187 - 2h after dinner:  120-235, 240 >> 129, 154-218, 237 >> 101-233 - bedtime: n/c Lowest: 105 >> 89 >> 96 >> 101; hypoglycemia awareness in the 70s. Highest: 390 (40 mg Prednisone) >> 240 (cake) >> 237 >> 233.  Meter: Accu-Chek  No CKD: Lab Results  Component Value Date   BUN 15 02/12/2019   Lab Results  Component Value Date   CREATININE 0.73 02/12/2019  On losartan.  + HL: Lab Results  Component Value Date   CHOL 176 02/12/2019   HDL 51 02/12/2019   LDLCALC 99 02/12/2019   TRIG 148 02/12/2019  On Lipitor 20.  Last eye exam: 09/2018: No DR: +  Abnormal- returns in 03/2019 to recheck IO pressure.  She has HTN, asthma, vitamin D deficiency.  She also has hair loss and sees Dr. Lois Huxley, dermatologist at [facility].  She specializes in hair loss.  ROS: Constitutional: no weight gain/no weight loss, no fatigue, no subjective hyperthermia, no subjective hypothermia Eyes: no blurry vision, no xerophthalmia ENT: no sore throat, no nodules palpated in neck, no dysphagia, no odynophagia, no hoarseness Cardiovascular: no CP/no SOB/no palpitations/no leg swelling Respiratory: no cough/no SOB/no wheezing Gastrointestinal: no N/no V/no D/no C/no acid reflux Musculoskeletal: no muscle aches/no joint aches Skin: no rashes, + hair loss Neurological: + tremors/no  numbness/no tingling/no dizziness  I reviewed pt's medications, allergies, PMH, social hx, family hx, and changes were documented in the history of present illness. Otherwise, unchanged from my initial visit note.  Past Medical History:  Diagnosis Date  . Allergy   . Alopecia   . Asthma   . Back pain   . Blood transfusion without reported diagnosis    with hysterectomy   . Diabetes ([place])   . Diabetes mellitus ([place])   . GERD (gastroesophageal reflux disease)   . 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 Hyperlipidemia   . Hypertension   . Joint pain   . Lactose intolerance   . Obesity   . Raynauds syndrome   . Swelling   . Tremor   . Vitamin D deficiency    Past Surgical History:  Procedure Laterality Date  . ABDOMINAL HYSTERECTOMY    . BREAST BIOPSY    . COLONOSCOPY    . MYOMECTOMY    . POLYPECTOMY     Social History   Socioeconomic History  . Marital status: Single    Spouse name: Not on file  . Number of children: no  Social Needs  Occupational History  . Occupation: [employer]    Employer: [employer]  Tobacco Use  . Smoking status: Never Smoker  . Smokeless tobacco: Never Used  Substance and Sexual Activity  . Alcohol use:  Wine, 2-3 times a year, 1 drink  . Drug use: No   Current Outpatient Medications on File Prior to Visit  Medication Sig Dispense Refill  . albuterol (VENTOLIN HFA) 108 (90 Base) MCG/ACT inhaler Inhale 2 puffs into the lungs every 6 (six) hours as needed for wheezing or shortness of breath. 1 Inhaler 0  . Alcohol Swabs (ALCOHOL WIPES) 70 % PADS 30 Packages by Does not apply route 2 (two) times daily. 100 each 0  . amLODipine (NORVASC) 5 MG tablet Take 5 mg by mouth daily.    Marland Kitchen atorvastatin (LIPITOR) 20 MG tablet Take 20 mg by mouth daily.    . Blood Glucose Monitoring Suppl (FREESTYLE FREEDOM LITE) w/Device KIT Use to check blood sugars 1 kit 0  . Cholecalciferol 4000 units CAPS Take 4,000 Units by  mouth daily.     Marland Kitchen glipiZIDE (GLUCOTROL) 5 MG tablet Take 1-2 tablets (5-10 mg total) by mouth daily before supper. 90 tablet 3  . glucose blood (FREESTYLE LITE) test strip Use to check blood sugar 2 times a day. 200 each 12  . hydrochlorothiazide (HYDRODIURIL) 25 MG tablet Take 25 mg by mouth every morning.  1  . Insulin Pen Needle (BD PEN NEEDLE NANO U/F) 32G X 4 MM MISC 1 Package by Does not apply route daily. 100 each prn  . Lancets (FREESTYLE) lancets Use to check blood sugar 2 times  a day. 200 each 12  . liraglutide (VICTOZA) 18 MG/3ML SOPN INJECT 1.8 MGS INTO THE SKIN EVERY MORNING. 3 pen 0  . losartan (COZAAR) 50 MG tablet Take 50 mg by mouth daily.     . Melatonin 3 MG TABS Take 1 tablet by mouth at bedtime as needed.    . metFORMIN (GLUCOPHAGE-XR) 500 MG 24 hr tablet Take 2 tablets (1,000 mg total) by mouth at bedtime. 180 tablet 3  . montelukast (SINGULAIR) 10 MG tablet Take 10 mg by mouth at bedtime.    . propranolol (INDERAL) 20 MG tablet Take 20 mg by mouth daily.      No current facility-administered medications on file prior to visit.   Allergies  Allergen Reactions  . Erythromycin Nausea Only  . Milk-Related Compounds Other (See Comments)    Lactose intollerance   Family History  Problem Relation Age of Onset  . Multiple myeloma Mother   . Hypertension Mother   . Hyperlipidemia Mother   . Cancer Mother        multiple Myeloma in remission   . Obesity Mother   . Hypertension Father   . Hyperlipidemia Father   . Cancer Father   . Prostate cancer Father   . Asthma Other   . Hyperlipidemia Other   . Hypertension Other   . Cancer Other   . COPD Other   . Stroke Other   . Breast cancer Maternal Aunt   . Breast cancer Paternal Aunt   . Breast cancer Maternal Aunt   . Colon cancer Brother 51  . Colon polyps Brother   . Esophageal cancer Neg Hx   . Rectal cancer Neg Hx   . Stomach cancer Neg Hx     PE: BP 120/70   Pulse 86   Ht 5' 4" (1.626 m)   Wt 272 lb  (123.4 kg)   LMP 08/31/2008   SpO2 97%   BMI 46.69 kg/m  Wt Readings from Last 3 Encounters:  05/23/19 272 lb (123.4 kg)  05/07/19 270 lb (122.5 kg)  04/16/19 269 lb (122 kg)   Constitutional: overweight, in NAD Eyes: PERRLA, EOMI, no exophthalmos ENT: moist mucous membranes, no thyromegaly, no cervical lymphadenopathy Cardiovascular: RRR, No MRG Respiratory: CTA B Gastrointestinal: abdomen soft, NT, ND, BS+ Musculoskeletal: no deformities, strength intact in all 4 Skin: moist, warm, no rashes Neurological: no tremor with outstretched hands, DTR normal in all 4  ASSESSMENT: 1.  Mild subclinical thyrotoxicosis  2.  left thyroid nodule Thyroid ultrasound: FINDINGS: Parenchymal Echotexture: Mildly heterogenous Isthmus: 0.5 cm Right lobe: 4.5 x 2.1 x 1.3 cm Left lobe: 4.3 x 1.4 x 1.6 cm _________________________________________________________  Nodule # 1: Location: Right; Inferior Maximum size: 2.9 cm; Other 2 dimensions: 2.0 x 2.1 cm Composition: solid/almost completely solid (2) Echogenicity: hypoechoic (2) Shape: not taller-than-wide (0) Margins: smooth (0) Echogenic foci: none (0) ACR TI-RADS total points: 4. ACR TI-RADS risk category: TR4 (4-6 points).  ACR TI-RADS recommendations: **Given size (>/= 1.5 cm) and appearance, fine needle aspiration of this moderately suspicious nodule should be considered based on TI-RADS criteria.________________________________________________  Multiple other smaller nodules measure 0.7 cm or less in size and do not meet criteria for biopsy nor follow-up.  IMPRESSION: Right lower pole nodule 1 meets criteria for fine needle aspiration biopsy.  Electronically Signed By: Marybelle Killings M.D. On: 04/14/2017 08:17   FNA: Adequacy Reason Satisfactory For Evaluation. Diagnosis THYROID, FINE NEEDLE ASPIRATION, RLP (SPECIMEN 1 OF 1,COLLECTED 05/11/17): CONSISTENT WITH BENIGN FOLLICULAR NODULE (  [place] CATEGORY II). Enid Cutter MD Pathologist, Electronic Signature (Case signed 05/15/2017) Specimen Clinical Information Right Inferior, 2.9cm; Other 2 dimensions: 2.0 x 2.1cm, solid/almost completely solid, hypoechoic, TI-RADS total points - 4, Moderately suspicious nodule Source Thyroid, Fine Needle Aspiration, Right, RLP (Specimen 1 of 1, collected on 05/11/17)  PLAN:  1. Patient with history of a slightly low TSH with normal free thyroid hormones (subclinical thyrotoxicosis) with thyrotoxic symptoms: Hot flashes (however, she is postmenopausal)), palpitations with exertion, hair loss.  -TFTs were normal at last check Lab Results  Component Value Date   TSH 0.63 09/20/2018  -We will continue to follow her TFTs and if they start to worsen, we may need a thyroid uptake and scan -We will repeat them at next visit  2. L thyroid nodule -Initially felt on palpation -She denies neck compression symptoms -She had a solid 2.9 cm nodule on the ultrasound from 03/2017.  The biopsy of the nodule in 05/2017 and the results were benign. -We will repeat her ultrasound now  3. DM2 -Patient with uncontrolled type 2 diabetes on oral antidiabetic regimen and daily GLP-1 receptor agonist. -She continues to be seen in the weight management clinic. -At last visit, she was having in her leg, 8:30 PM and her sugars before bedtime were higher than target.  I advised her to use higher dose of glipizide before a larger late dinner. -At this visit, per review of her meter downloads, sugars are still slightly above target.  At night, they are the highest, even in the 200s.  Upon questioning, she is occasionally taking 10 mg of glipizide but only after sugars increase after dinner.  We discussed that she needs to take the glipizide 15 to 30 minutes before dinner. -Since she had a change in her insurance, she may be able to afford the [place] now.  We will try to switch from Victoza to Ozempic 1 mg weekly.  Discussed about benefits and  possible side effects. - I suggested to: Patient Instructions  Please continue: - Metformin ER 1000 mg with dinner - Victoza 1.8 mg daily (try to switch to Ozempic 1 mg weekly) - Glipizide 5 mg-10 mg before a late or large dinner.  Please return in 4 months with your sugar log.   - we checked her HbA1c: 7.3% (better) - advised to check sugars at different times of the day - 1x a day, rotating check times - advised for yearly eye exams >> she is UTD - return to clinic in 3-4 months  Thyroid U/S (06/05/2019): CLINICAL DATA:  Goiter. 59 year old female with a history of thyroid nodules. The 2.9 cm right inferior thyroid nodule was previously biopsied on 05/11/2017  Parenchymal Echotexture: Mildly heterogenous Isthmus: 0.4 cm Right lobe: 4.9 x 2.2 x 1.5 cm Left lobe: 4.0 x 1.6 x 1.3 cm _________________________________________________________  The previously biopsied nodule in the right inferior gland is unchanged at 2.9 x 2.2 x 2.1 cm. Additional small subcentimeter thyroid nodules again noted scattered throughout the left gland. None of these nodules demonstrates significant interval growth or change in morphology. These lesions do not meet criteria for biopsy or dedicated imaging surveillance.  IMPRESSION: No significant interval change in the size or appearance of the previously biopsied 2.9 cm nodule in the right inferior gland.  No new or suspicious thyroid nodules identified.   Philemon Kingdom, MD PhD [facility] Endocrinology

## 2019-05-29 DIAGNOSIS — I10 Essential (primary) hypertension: Secondary | ICD-10-CM | POA: Diagnosis not present

## 2019-05-29 DIAGNOSIS — E119 Type 2 diabetes mellitus without complications: Secondary | ICD-10-CM | POA: Diagnosis not present

## 2019-05-29 DIAGNOSIS — E7800 Pure hypercholesterolemia, unspecified: Secondary | ICD-10-CM | POA: Diagnosis not present

## 2019-05-29 MED FILL — LOSARTAN POTASSIUM 50 MG TA: 50 | 90 days supply | Qty: 90 | Fill #0

## 2019-05-29 MED FILL — MONTELUKAST SOD 10 MG TAB: 10 | 90 days supply | Qty: 90 | Fill #0

## 2019-05-30 ENCOUNTER — Other Ambulatory Visit: Payer: 59

## 2019-05-31 MED FILL — glipiZIDE 5 MG TABS: 5 | 45 days supply | Qty: 90 | Fill #3

## 2019-05-31 MED FILL — FREESTYLE LITE TEST STRIP: 50 days supply | Qty: 100 | Fill #2

## 2019-06-05 ENCOUNTER — Ambulatory Visit
Admission: RE | Admit: 2019-06-05 | Discharge: 2019-06-05 | Disposition: A | Discharge status: Home / Self Care | Payer: 59 | Source: Ambulatory Visit | Attending: Internal Medicine | Admitting: Internal Medicine

## 2019-06-05 DIAGNOSIS — E042 Nontoxic multinodular goiter: Secondary | ICD-10-CM

## 2019-06-05 DIAGNOSIS — E041 Nontoxic single thyroid nodule: Secondary | ICD-10-CM | POA: Diagnosis not present

## 2019-06-10 ENCOUNTER — Other Ambulatory Visit: Payer: Self-pay

## 2019-06-10 ENCOUNTER — Ambulatory Visit (INDEPENDENT_AMBULATORY_CARE_PROVIDER_SITE_OTHER): Payer: 59 | Admitting: Physician Assistant

## 2019-06-10 ENCOUNTER — Encounter (INDEPENDENT_AMBULATORY_CARE_PROVIDER_SITE_OTHER): Payer: Self-pay | Admitting: Physician Assistant

## 2019-06-10 ENCOUNTER — Other Ambulatory Visit (HOSPITAL_COMMUNITY): Payer: Self-pay | Admitting: Family Medicine

## 2019-06-10 VITALS — BP 131/88 | HR 96 | Temp 98.2°F | Ht 64.0 in | Wt 269.0 lb

## 2019-06-10 DIAGNOSIS — Z6841 Body Mass Index (BMI) 40.0 and over, adult: Secondary | ICD-10-CM

## 2019-06-10 DIAGNOSIS — E785 Hyperlipidemia, unspecified: Secondary | ICD-10-CM

## 2019-06-10 DIAGNOSIS — E559 Vitamin D deficiency, unspecified: Secondary | ICD-10-CM | POA: Diagnosis not present

## 2019-06-10 DIAGNOSIS — Z9189 Other specified personal risk factors, not elsewhere classified: Secondary | ICD-10-CM

## 2019-06-10 DIAGNOSIS — E1169 Type 2 diabetes mellitus with other specified complication: Secondary | ICD-10-CM

## 2019-06-10 NOTE — Progress Notes (Signed)
Chief Complaint:   OBESITY Veronica Russell is here to discuss her progress with her obesity treatment plan along with follow-up of her obesity related diagnoses. Veronica Russell is on the Category 2 Plan and states she is following her eating plan approximately 60% of the time. Veronica Russell states she is exercising 0 minutes 0 times per week.  Today's visit was #: 73 Starting weight: 259 lbs Starting date: 01/26/2018 Today's weight: 269 lbs Today's date: 06/10/2019 Total lbs lost to date: 0 Total lbs lost since last in-office visit: 1  Interim History: Veronica Russell states that she has been eating more on some evenings since she is going out with her friends every once in a while now. When she eats at home, she doesn't get in all of her food for dinner.   Subjective:   Hyperlipidemia associated with type 2 diabetes mellitus ([place]). Veronica Russell is on glipizide, metformin, and Ozempic. Her endocrinologist discontinued Victoza and started her on Ozempic, which she is tolerating well. No nausea, vomiting, or diarrhea. Fasting blood sugars range between 113 and 172.   Lab Results  Component Value Date   HGBA1C 7.3 (A) 05/23/2019   HGBA1C 7.6 (H) 02/12/2019   HGBA1C 7.1 (A) 01/21/2019   Lab Results  Component Value Date   LDLCALC 99 02/12/2019   CREATININE 0.73 02/12/2019   Lab Results  Component Value Date   INSULIN 43.2 (H) 02/12/2019   INSULIN 42.1 (H) 07/19/2018   INSULIN 43.1 (H) 03/07/2018   INSULIN 56.5 (H) 10/26/2017   INSULIN 39.0 (H) 09/29/2016   Vitamin D deficiency. Veronica Russell is on Vitamin D OTC. No nausea, vomiting, or muscle weakness. She is due for labs. Last Vitamin D 58.7 on 02/12/2019.  At risk for osteoporosis. Veronica Russell is at higher risk of osteopenia and osteoporosis due to Vitamin D deficiency.   Assessment/Plan:   Hyperlipidemia associated with type 2 diabetes mellitus ([place]). Good blood sugar control is important to decrease the likelihood of diabetic complications such as nephropathy,  neuropathy, limb loss, blindness, coronary artery disease, and death. Intensive lifestyle modification including diet, exercise and weight loss are the first line of treatment for diabetes. Veronica Russell will continue her medications as prescribed. Comprehensive metabolic panel, Insulin, random, Lipid Panel With LDL/HDL Ratio labs ordered today.  Vitamin D deficiency. Low Vitamin D level contributes to fatigue and are associated with obesity, breast, and colon cancer. VITAMIN D 25 Hydroxy (Vit-D Deficiency, Fractures) level ordered today.  At risk for osteoporosis. Veronica Russell was given approximately 15 minutes of osteoporosis prevention counseling today. Veronica Russell is at risk for osteopenia and osteoporosis due to her Vitamin D deficiency. She was encouraged to take her Vitamin D and follow her higher calcium diet and increase strengthening exercise to help strengthen her bones and decrease her risk of osteopenia and osteoporosis.  Repetitive spaced learning was employed today to elicit superior memory formation and behavioral change.  Class 3 severe obesity with serious comorbidity and body mass index (BMI) of 45.0 to 49.9 in adult, unspecified obesity type ([place]).  Veronica Russell is currently in the action stage of change. As such, her goal is to continue with weight loss efforts. She has agreed to the Category 2 Plan + 200 protein calories.   Exercise goals: For substantial health benefits, adults should do at least 150 minutes (2 hours and 30 minutes) a week of moderate-intensity, or 75 minutes (1 hour and 15 minutes) a week of vigorous-intensity aerobic physical activity, or an equivalent combination of moderate- and vigorous-intensity aerobic  activity. Aerobic activity should be performed in episodes of at least 10 minutes, and preferably, it should be spread throughout the week.  Behavioral modification strategies: no skipping meals and meal planning and cooking strategies.  Veronica Russell has agreed to follow-up with our clinic  in 3-4 weeks. She was informed of the importance of frequent follow-up visits to maximize her success with intensive lifestyle modifications for her multiple health conditions.   Veronica Russell was informed we would discuss her lab results at her next visit unless there is a critical issue that needs to be addressed sooner. Veronica Russell agreed to keep her next visit at the agreed upon time to discuss these results.  Objective:   Blood pressure 131/88, pulse 96, temperature 98.2 F (36.8 C), temperature source Oral, height 5\' 4"  (1.626 m), weight 269 lb (122 kg), last menstrual period 08/31/2008, SpO2 98 %. Body mass index is 46.17 kg/m.  General: Cooperative, alert, well developed, in no acute distress. HEENT: Conjunctivae and lids unremarkable. Cardiovascular: Regular rhythm.  Lungs: Normal work of breathing. Neurologic: No focal deficits.   Lab Results  Component Value Date   CREATININE 0.73 02/12/2019   BUN 15 02/12/2019   NA 142 02/12/2019   K 3.6 02/12/2019   CL 99 02/12/2019   CO2 27 02/12/2019   Lab Results  Component Value Date   ALT 33 (H) 02/12/2019   AST 18 02/12/2019   ALKPHOS 100 02/12/2019   BILITOT 0.4 02/12/2019   Lab Results  Component Value Date   HGBA1C 7.3 (A) 05/23/2019   HGBA1C 7.6 (H) 02/12/2019   HGBA1C 7.1 (A) 01/21/2019   HGBA1C 7.1 (A) 09/20/2018   HGBA1C 6.8 (H) 07/19/2018   Lab Results  Component Value Date   INSULIN 43.2 (H) 02/12/2019   INSULIN 42.1 (H) 07/19/2018   INSULIN 43.1 (H) 03/07/2018   INSULIN 56.5 (H) 10/26/2017   INSULIN 39.0 (H) 09/29/2016   Lab Results  Component Value Date   TSH 0.63 09/20/2018   Lab Results  Component Value Date   CHOL 176 02/12/2019   HDL 51 02/12/2019   LDLCALC 99 02/12/2019   TRIG 148 02/12/2019   Lab Results  Component Value Date   WBC 6.4 03/15/2018   HGB 14.1 03/15/2018   HCT 44.4 03/15/2018   MCV 89.0 03/15/2018   PLT 216 03/15/2018   No results found for: IRON, TIBC, FERRITIN  Attestation  Statements:   Reviewed by clinician on day of visit: allergies, medications, problem list, medical history, surgical history, family history, social history, and previous encounter notes.  IMichaelene Song, am acting as transcriptionist for Abby Potash, PA-C   I have reviewed the above documentation for accuracy and completeness, and I agree with the above. Abby Potash, PA-C

## 2019-06-11 LAB — LIPID PANEL WITH LDL/HDL RATIO
Cholesterol, Total: 173 mg/dL (ref 100–199)
HDL: 53 mg/dL (ref >39)
LDL Chol Calc (NIH): 92 mg/dL (ref 0–99)
LDL/HDL Ratio: 1.7 ratio (ref 0.0–3.2)
Triglycerides: 161 mg/dL — ABNORMAL HIGH (ref 0–149)
VLDL Cholesterol Cal: 28 mg/dL (ref 5–40)

## 2019-06-11 LAB — COMPREHENSIVE METABOLIC PANEL
ALT: 31 IU/L (ref 0–32)
AST: 20 IU/L (ref 0–40)
Albumin/Globulin Ratio: 1.3 (ref 1.2–2.2)
Albumin: 4 g/dL (ref 3.8–4.9)
Alkaline Phosphatase: 96 IU/L (ref 39–117)
BUN/Creatinine Ratio: 15 (ref 9–23)
BUN: 11 mg/dL (ref 6–24)
Bilirubin Total: 0.3 mg/dL (ref 0.0–1.2)
CO2: 26 mmol/L (ref 20–29)
Calcium: 9.7 mg/dL (ref 8.7–10.2)
Chloride: 103 mmol/L (ref 96–106)
Creatinine, Ser: 0.75 mg/dL (ref 0.57–1.00)
GFR calc Af Amer: 101 mL/min/{1.73_m2} (ref >59)
GFR calc non Af Amer: 88 mL/min/{1.73_m2} (ref >59)
Globulin, Total: 3 g/dL (ref 1.5–4.5)
Glucose: 153 mg/dL — ABNORMAL HIGH (ref 65–99)
Potassium: 3.9 mmol/L (ref 3.5–5.2)
Sodium: 143 mmol/L (ref 134–144)
Total Protein: 7 g/dL (ref 6.0–8.5)

## 2019-06-11 LAB — INSULIN, RANDOM: INSULIN: 35.5 u[IU]/mL — ABNORMAL HIGH (ref 2.6–24.9)

## 2019-06-11 LAB — VITAMIN D 25 HYDROXY (VIT D DEFICIENCY, FRACTURES): Vit D, 25-Hydroxy: 55.2 ng/mL (ref 30.0–100.0)

## 2019-06-19 MED FILL — METFORMIN HCL ER 500 MG TB2: 500 | 90 days supply | Qty: 180 | Fill #1

## 2019-06-20 MED FILL — OZEMPIC 1 MG/DOSE SOPN: 2 | 84 days supply | Qty: 9 | Fill #1

## 2019-06-21 MED FILL — HYDROCHLOROTHIAZIDE 25 MG T: 25 | 90 days supply | Qty: 90 | Fill #0

## 2019-06-21 MED FILL — AMLODIPINE BESYLATE 5 MG TA: 5 | 90 days supply | Qty: 90 | Fill #0

## 2019-07-08 ENCOUNTER — Other Ambulatory Visit: Payer: Self-pay

## 2019-07-08 ENCOUNTER — Ambulatory Visit (INDEPENDENT_AMBULATORY_CARE_PROVIDER_SITE_OTHER): Payer: 59 | Admitting: Physician Assistant

## 2019-07-08 ENCOUNTER — Encounter (INDEPENDENT_AMBULATORY_CARE_PROVIDER_SITE_OTHER): Payer: Self-pay | Admitting: Physician Assistant

## 2019-07-08 VITALS — BP 128/84 | HR 97 | Temp 98.2°F | Ht 64.0 in | Wt 270.0 lb

## 2019-07-08 DIAGNOSIS — E785 Hyperlipidemia, unspecified: Secondary | ICD-10-CM

## 2019-07-08 DIAGNOSIS — Z6841 Body Mass Index (BMI) 40.0 and over, adult: Secondary | ICD-10-CM

## 2019-07-08 DIAGNOSIS — I10 Essential (primary) hypertension: Secondary | ICD-10-CM

## 2019-07-08 DIAGNOSIS — Z9189 Other specified personal risk factors, not elsewhere classified: Secondary | ICD-10-CM | POA: Diagnosis not present

## 2019-07-08 DIAGNOSIS — E1169 Type 2 diabetes mellitus with other specified complication: Secondary | ICD-10-CM | POA: Diagnosis not present

## 2019-07-08 NOTE — Progress Notes (Signed)
Chief Complaint:   OBESITY Veronica Russell is here to discuss her progress with her obesity treatment plan along with follow-up of her obesity related diagnoses. Veronica Russell is on the Category 2 Plan + 200 calories and states she is following her eating plan approximately 65% of the time. Veronica Russell states she is exercising 0 minutes 0 times per week.  Today's visit was #: 5 Starting weight: 259 lbs Starting date: 01/26/2018 Today's weight: 270 lbs Today's date: 07/08/2019 Total lbs lost to date: 0 Total lbs lost since last in-office visit: 0  Interim History: Veronica Russell continues to have issues getting in all of her food. Her Ozempic is reducing her appetite dramatically and she does not feel like eating.  Subjective:   Type 2 diabetes mellitus with hyperlipidemia (Veronica Russell). Veronica Russell is on Ozempic, metformin, and glipizide. No hypoglycemia. She sees Dr. Cruzita Lederer.   Lab Results  Component Value Date   HGBA1C 7.3 (A) 05/23/2019   HGBA1C 7.6 (H) 02/12/2019   HGBA1C 7.1 (A) 01/21/2019   Lab Results  Component Value Date   LDLCALC 92 06/10/2019   CREATININE 0.75 06/10/2019   Lab Results  Component Value Date   INSULIN 35.5 (H) 06/10/2019   INSULIN 43.2 (H) 02/12/2019   INSULIN 42.1 (H) 07/19/2018   INSULIN 43.1 (H) 03/07/2018   INSULIN 56.5 (H) 10/26/2017   Essential hypertension. Veronica Russell is on amlodipine and HCTZ. No chest pain or headache. Blood pressure normal.  BP Readings from Last 3 Encounters:  07/08/19 128/84  06/10/19 131/88  05/23/19 120/70   Lab Results  Component Value Date   CREATININE 0.75 06/10/2019   CREATININE 0.73 02/12/2019   CREATININE 0.62 07/19/2018   At risk for heart disease. Veronica Russell is at a higher than average risk for cardiovascular disease due to obesity.   Assessment/Plan:   Type 2 diabetes mellitus with hyperlipidemia (Veronica Russell). Good blood sugar control is important to decrease the likelihood of diabetic complications such as nephropathy, neuropathy, limb loss,  blindness, coronary artery disease, and death. Intensive lifestyle modification including diet, exercise and weight loss are the first line of treatment for diabetes. She will continue her medications as directed.   Essential hypertension. Veronica Russell is working on healthy weight loss and exercise to improve blood pressure control. We will watch for signs of hypotension as she continues her lifestyle modifications. She will continue her medications as directed.   At risk for heart disease. Veronica Russell was given approximately 15 minutes of coronary artery disease prevention counseling today. She is 59 y.o. female and has risk factors for heart disease including obesity. We discussed intensive lifestyle modifications today with an emphasis on specific weight loss instructions and strategies.   Repetitive spaced learning was employed today to elicit superior memory formation and behavioral change.  Class 3 severe obesity with serious comorbidity and body mass index (BMI) of 45.0 to 49.9 in adult, unspecified obesity type (Veronica Russell).  Veronica Russell is currently in the action stage of change. As such, her goal is to continue with weight loss efforts. She has agreed to the Category 2 Plan + 200 calories.   Exercise goals: For substantial health benefits, adults should do at least 150 minutes (2 hours and 30 minutes) a week of moderate-intensity, or 75 minutes (1 hour and 15 minutes) a week of vigorous-intensity aerobic physical activity, or an equivalent combination of moderate- and vigorous-intensity aerobic activity. Aerobic activity should be performed in episodes of at least 10 minutes, and preferably, it should be spread throughout the  week.  Behavioral modification strategies: increasing lean protein intake and no skipping meals.  Veronica Russell has agreed to follow-up with our clinic in 3-4 weeks. She was informed of the importance of frequent follow-up visits to maximize her success with intensive lifestyle modifications for her  multiple health conditions.   Objective:   Blood pressure 128/84, pulse 97, temperature 98.2 F (36.8 C), temperature source Oral, height 5\' 4"  (1.626 m), weight 270 lb (122.5 kg), last menstrual period 08/31/2008, SpO2 98 %. Body mass index is 46.35 kg/m.  General: Cooperative, alert, well developed, in no acute distress. HEENT: Conjunctivae and lids unremarkable. Cardiovascular: Regular rhythm.  Lungs: Normal work of breathing. Neurologic: No focal deficits.   Lab Results  Component Value Date   CREATININE 0.75 06/10/2019   BUN 11 06/10/2019   NA 143 06/10/2019   K 3.9 06/10/2019   CL 103 06/10/2019   CO2 26 06/10/2019   Lab Results  Component Value Date   ALT 31 06/10/2019   AST 20 06/10/2019   ALKPHOS 96 06/10/2019   BILITOT 0.3 06/10/2019   Lab Results  Component Value Date   HGBA1C 7.3 (A) 05/23/2019   HGBA1C 7.6 (H) 02/12/2019   HGBA1C 7.1 (A) 01/21/2019   HGBA1C 7.1 (A) 09/20/2018   HGBA1C 6.8 (H) 07/19/2018   Lab Results  Component Value Date   INSULIN 35.5 (H) 06/10/2019   INSULIN 43.2 (H) 02/12/2019   INSULIN 42.1 (H) 07/19/2018   INSULIN 43.1 (H) 03/07/2018   INSULIN 56.5 (H) 10/26/2017   Lab Results  Component Value Date   TSH 0.63 09/20/2018   Lab Results  Component Value Date   CHOL 173 06/10/2019   HDL 53 06/10/2019   LDLCALC 92 06/10/2019   TRIG 161 (H) 06/10/2019   Lab Results  Component Value Date   WBC 6.4 03/15/2018   HGB 14.1 03/15/2018   HCT 44.4 03/15/2018   MCV 89.0 03/15/2018   PLT 216 03/15/2018   No results found for: IRON, TIBC, FERRITIN  Attestation Statements:   Reviewed by clinician on day of visit: allergies, medications, problem list, medical history, surgical history, family history, social history, and previous encounter notes.  IMichaelene Song, am acting as transcriptionist for Abby Potash, PA-C   I have reviewed the above documentation for accuracy and completeness, and I agree with the above. Abby Potash, PA-C

## 2019-07-15 MED FILL — FREESTYLE LITE TEST STRIP: 50 days supply | Qty: 100 | Fill #3

## 2019-08-08 ENCOUNTER — Encounter (INDEPENDENT_AMBULATORY_CARE_PROVIDER_SITE_OTHER): Payer: Self-pay | Admitting: Physician Assistant

## 2019-08-08 ENCOUNTER — Ambulatory Visit (INDEPENDENT_AMBULATORY_CARE_PROVIDER_SITE_OTHER): Payer: 59 | Admitting: Physician Assistant

## 2019-08-08 ENCOUNTER — Other Ambulatory Visit: Payer: Self-pay

## 2019-08-08 VITALS — BP 123/82 | HR 105 | Temp 98.1°F | Ht 64.0 in | Wt 267.0 lb

## 2019-08-08 DIAGNOSIS — E782 Mixed hyperlipidemia: Secondary | ICD-10-CM

## 2019-08-08 DIAGNOSIS — E1169 Type 2 diabetes mellitus with other specified complication: Secondary | ICD-10-CM

## 2019-08-08 DIAGNOSIS — Z6841 Body Mass Index (BMI) 40.0 and over, adult: Secondary | ICD-10-CM | POA: Diagnosis not present

## 2019-08-08 DIAGNOSIS — E66813 Obesity, class 3: Secondary | ICD-10-CM

## 2019-08-08 NOTE — Progress Notes (Signed)
Chief Complaint:   OBESITY Veronica Russell is here to discuss her progress with her obesity treatment plan along with follow-up of her obesity related diagnoses. Veronica Russell is on the Category 2 Plan and states she is following her eating plan approximately 65% of the time. Veronica Russell states she is exercising 0 minutes 0 times per week.  Today's visit was #: 61 Starting weight: 259 lbs Starting date: 01/26/2018 Today's weight: 267 lbs Today's date: 08/08/2019 Total lbs lost to date: 0 Total lbs lost since last in-office visit: 3  Interim History: Veronica Russell states that she has been making an effort to eat more during the day.  Subjective:   DM type 2 with diabetic mixed hyperlipidemia ([place]). Veronica Russell is on Ozempic, glipizide, and metformin. No hypoglycemia.   Lab Results  Component Value Date   HGBA1C 7.3 (A) 05/23/2019   HGBA1C 7.6 (H) 02/12/2019   HGBA1C 7.1 (A) 01/21/2019   Lab Results  Component Value Date   LDLCALC 92 06/10/2019   CREATININE 0.75 06/10/2019   Lab Results  Component Value Date   INSULIN 35.5 (H) 06/10/2019   INSULIN 43.2 (H) 02/12/2019   INSULIN 42.1 (H) 07/19/2018   INSULIN 43.1 (H) 03/07/2018   INSULIN 56.5 (H) 10/26/2017   Assessment/Plan:   DM type 2 with diabetic mixed hyperlipidemia ([place]). Good blood sugar control is important to decrease the likelihood of diabetic complications such as nephropathy, neuropathy, limb loss, blindness, coronary artery disease, and death. Intensive lifestyle modification including diet, exercise and weight loss are the first line of treatment for diabetes. Veronica Russell will continue her medications as directed.  Class 3 severe obesity with serious comorbidity and body mass index (BMI) of 40.0 to 44.9 in adult, unspecified obesity type ([place]).  Veronica Russell is currently in the action stage of change. As such, her goal is to continue with weight loss efforts. She will change meal plans and will now keep a food journal and adhering to recommended  goals of 1300-1500 calories and 85 grams of protein daily.   Exercise goals: For substantial health benefits, adults should do at least 150 minutes (2 hours and 30 minutes) a week of moderate-intensity, or 75 minutes (1 hour and 15 minutes) a week of vigorous-intensity aerobic physical activity, or an equivalent combination of moderate- and vigorous-intensity aerobic activity. Aerobic activity should be performed in episodes of at least 10 minutes, and preferably, it should be spread throughout the week.  Behavioral modification strategies: planning for success and keeping a strict food journal.  Veronica Russell has agreed to follow-up with our clinic in 3-4 weeks. She was informed of the importance of frequent follow-up visits to maximize her success with intensive lifestyle modifications for her multiple health conditions.   Objective:   Blood pressure 123/82, pulse (!) 105, temperature 98.1 F (36.7 C), temperature source Oral, height 5\' 4"  (1.626 m), weight 267 lb (121.1 kg), last menstrual period 08/31/2008, SpO2 96 %. Body mass index is 45.83 kg/m.  General: Cooperative, alert, well developed, in no acute distress. HEENT: Conjunctivae and lids unremarkable. Cardiovascular: Regular rhythm.  Lungs: Normal work of breathing. Neurologic: No focal deficits.   Lab Results  Component Value Date   CREATININE 0.75 06/10/2019   BUN 11 06/10/2019   NA 143 06/10/2019   K 3.9 06/10/2019   CL 103 06/10/2019   CO2 26 06/10/2019   Lab Results  Component Value Date   ALT 31 06/10/2019   AST 20 06/10/2019   ALKPHOS 96 06/10/2019  BILITOT 0.3 06/10/2019   Lab Results  Component Value Date   HGBA1C 7.3 (A) 05/23/2019   HGBA1C 7.6 (H) 02/12/2019   HGBA1C 7.1 (A) 01/21/2019   HGBA1C 7.1 (A) 09/20/2018   HGBA1C 6.8 (H) 07/19/2018   Lab Results  Component Value Date   INSULIN 35.5 (H) 06/10/2019   INSULIN 43.2 (H) 02/12/2019   INSULIN 42.1 (H) 07/19/2018   INSULIN 43.1 (H) 03/07/2018    INSULIN 56.5 (H) 10/26/2017   Lab Results  Component Value Date   TSH 0.63 09/20/2018   Lab Results  Component Value Date   CHOL 173 06/10/2019   HDL 53 06/10/2019   LDLCALC 92 06/10/2019   TRIG 161 (H) 06/10/2019   Lab Results  Component Value Date   WBC 6.4 03/15/2018   HGB 14.1 03/15/2018   HCT 44.4 03/15/2018   MCV 89.0 03/15/2018   PLT 216 03/15/2018   No results found for: IRON, TIBC, FERRITIN  Attestation Statements:   Reviewed by clinician on day of visit: allergies, medications, problem list, medical history, surgical history, family history, social history, and previous encounter notes.  Time spent on visit including pre-visit chart review and post-visit charting and care was 30 minutes.   IMichaelene Song, am acting as transcriptionist for Abby Potash, PA-C   I have reviewed the above documentation for accuracy and completeness, and I agree with the above. Abby Potash, PA-C

## 2019-08-15 ENCOUNTER — Other Ambulatory Visit (HOSPITAL_COMMUNITY): Payer: Self-pay | Admitting: Family Medicine

## 2019-08-15 MED FILL — PROPRANOLOL 20 MG TABLET: 20 | 90 days supply | Qty: 90 | Fill #0

## 2019-08-15 MED FILL — ATORVASTATIN 20 MG TABLET: 20 | 90 days supply | Qty: 90 | Fill #0

## 2019-08-21 MED FILL — MONTELUKAST SOD 10 MG TAB: 10 | 90 days supply | Qty: 90 | Fill #1

## 2019-08-21 MED FILL — LOSARTAN POTASSIUM 50 MG TA: 50 | 90 days supply | Qty: 90 | Fill #1

## 2019-08-23 ENCOUNTER — Other Ambulatory Visit (HOSPITAL_COMMUNITY): Payer: Self-pay | Admitting: Family Medicine

## 2019-08-23 MED FILL — OMEPRAZOLE DR 20 MG CAPSULE: 20 | 90 days supply | Qty: 90 | Fill #0

## 2019-09-02 MED FILL — FREESTYLE LITE TEST STRIP: 50 days supply | Qty: 100 | Fill #4

## 2019-09-05 ENCOUNTER — Other Ambulatory Visit: Payer: Self-pay

## 2019-09-05 ENCOUNTER — Ambulatory Visit (INDEPENDENT_AMBULATORY_CARE_PROVIDER_SITE_OTHER): Payer: 59 | Admitting: Physician Assistant

## 2019-09-05 ENCOUNTER — Encounter (INDEPENDENT_AMBULATORY_CARE_PROVIDER_SITE_OTHER): Payer: Self-pay | Admitting: Physician Assistant

## 2019-09-05 VITALS — BP 125/82 | HR 100 | Temp 98.1°F | Ht 64.0 in | Wt 269.0 lb

## 2019-09-05 DIAGNOSIS — Z6841 Body Mass Index (BMI) 40.0 and over, adult: Secondary | ICD-10-CM

## 2019-09-05 DIAGNOSIS — E1169 Type 2 diabetes mellitus with other specified complication: Secondary | ICD-10-CM | POA: Diagnosis not present

## 2019-09-05 DIAGNOSIS — E785 Hyperlipidemia, unspecified: Secondary | ICD-10-CM | POA: Diagnosis not present

## 2019-09-05 DIAGNOSIS — E7849 Other hyperlipidemia: Secondary | ICD-10-CM | POA: Diagnosis not present

## 2019-09-05 MED FILL — OZEMPIC (1 MG/DOSE) 4 MG/3M: 4 | 84 days supply | Qty: 9 | Fill #0

## 2019-09-09 NOTE — Progress Notes (Signed)
Chief Complaint:   OBESITY Veronica Russell is here to discuss her progress with her obesity treatment plan along with follow-up of her obesity related diagnoses. Veronica Russell is on the Category 2 Plan and states she is following her eating plan approximately 60% of the time. Veronica Russell states she is exercising 0 minutes 0 times per week.  Today's visit was #: 43 Starting weight: 259 lbs Starting date: 01/26/2018 Today's weight: 269 lbs Today's date: 09/05/2019 Total lbs lost to date: 0 Total lbs lost since last in-office visit: 0  Interim History: Veronica Russell reports that she has had a very stressful last few weeks. She notes a lot of coworker sabotage. She has a [birth year] coming up.  Subjective:   Other hyperlipidemia. Veronica Russell is on atorvastatin, which she is tolerating well. No chest pain.   Lab Results  Component Value Date   CHOL 173 06/10/2019   HDL 53 06/10/2019   LDLCALC 92 06/10/2019   TRIG 161 (H) 06/10/2019   Lab Results  Component Value Date   ALT 31 06/10/2019   AST 20 06/10/2019   ALKPHOS 96 06/10/2019   BILITOT 0.3 06/10/2019   The 10-year ASCVD risk score Mikey Bussing DC Jr., et al., 2013) is: 12.4%   Values used to calculate the score:     Age: 59 years     Sex: Female     Is Non-Hispanic African American: Yes     Diabetic: Yes     Tobacco smoker: No     Systolic Blood Pressure: 454 mmHg     Is BP treated: Yes     HDL Cholesterol: 53 mg/dL     Total Cholesterol: 173 mg/dL  Type 2 diabetes mellitus with hyperlipidemia (Veronica Russell). Veronica Russell is on Ozempic, metformin, and glipizide. Last A1c was not at goal. She is due for labs.  Lab Results  Component Value Date   HGBA1C 7.3 (A) 05/23/2019   HGBA1C 7.6 (H) 02/12/2019   HGBA1C 7.1 (A) 01/21/2019   Lab Results  Component Value Date   LDLCALC 92 06/10/2019   CREATININE 0.75 06/10/2019   Lab Results  Component Value Date   INSULIN 35.5 (H) 06/10/2019   INSULIN 43.2 (H) 02/12/2019   INSULIN 42.1 (H) 07/19/2018   INSULIN 43.1  (H) 03/07/2018   INSULIN 56.5 (H) 10/26/2017   Assessment/Plan:   Other hyperlipidemia. Cardiovascular risk and specific lipid/LDL goals reviewed.  We discussed several lifestyle modifications today and Veronica Russell will continue to work on diet, exercise and weight loss efforts. Orders and follow up as documented in patient record. She will continue her medication as directed.   Counseling Intensive lifestyle modifications are the first line treatment for this issue. . Dietary changes: Increase soluble fiber. Decrease simple carbohydrates. . Exercise changes: Moderate to vigorous-intensity aerobic activity 150 minutes per week if tolerated. . Lipid-lowering medications: see documented in medical record.  Type 2 diabetes mellitus with hyperlipidemia ([place]). Good blood sugar control is important to decrease the likelihood of diabetic complications such as nephropathy, neuropathy, limb loss, blindness, coronary artery disease, and death. Intensive lifestyle modification including diet, exercise and weight loss are the first line of treatment for diabetes. Veronica Russell will continue her medications as directed.   Class 3 severe obesity with serious comorbidity and body mass index (BMI) of 45.0 to 49.9 in adult, unspecified obesity type ([place]).  Veronica Russell is currently in the action stage of change. As such, her goal is to continue with weight loss efforts. She has agreed to the Category  2 Plan.   Exercise goals: For substantial health benefits, adults should do at least 150 minutes (2 hours and 30 minutes) a week of moderate-intensity, or 75 minutes (1 hour and 15 minutes) a week of vigorous-intensity aerobic physical activity, or an equivalent combination of moderate- and vigorous-intensity aerobic activity. Aerobic activity should be performed in episodes of at least 10 minutes, and preferably, it should be spread throughout the week.  Behavioral modification strategies: meal planning and cooking strategies and  dealing with family or coworker sabotage.  Veronica Russell has agreed to follow-up with our clinic fasting in 3-4 weeks. She was informed of the importance of frequent follow-up visits to maximize her success with intensive lifestyle modifications for her multiple health conditions.   Objective:   Blood pressure 125/82, pulse 100, temperature 98.1 F (36.7 C), temperature source Oral, height 5\' 4"  (1.626 m), weight 269 lb (122 kg), last menstrual period 08/31/2008, SpO2 98 %. Body mass index is 46.17 kg/m.  General: Cooperative, alert, well developed, in no acute distress. HEENT: Conjunctivae and lids unremarkable. Cardiovascular: Regular rhythm.  Lungs: Normal work of breathing. Neurologic: No focal deficits.   Lab Results  Component Value Date   CREATININE 0.75 06/10/2019   BUN 11 06/10/2019   NA 143 06/10/2019   K 3.9 06/10/2019   CL 103 06/10/2019   CO2 26 06/10/2019   Lab Results  Component Value Date   ALT 31 06/10/2019   AST 20 06/10/2019   ALKPHOS 96 06/10/2019   BILITOT 0.3 06/10/2019   Lab Results  Component Value Date   HGBA1C 7.3 (A) 05/23/2019   HGBA1C 7.6 (H) 02/12/2019   HGBA1C 7.1 (A) 01/21/2019   HGBA1C 7.1 (A) 09/20/2018   HGBA1C 6.8 (H) 07/19/2018   Lab Results  Component Value Date   INSULIN 35.5 (H) 06/10/2019   INSULIN 43.2 (H) 02/12/2019   INSULIN 42.1 (H) 07/19/2018   INSULIN 43.1 (H) 03/07/2018   INSULIN 56.5 (H) 10/26/2017   Lab Results  Component Value Date   TSH 0.63 09/20/2018   Lab Results  Component Value Date   CHOL 173 06/10/2019   HDL 53 06/10/2019   LDLCALC 92 06/10/2019   TRIG 161 (H) 06/10/2019   Lab Results  Component Value Date   WBC 6.4 03/15/2018   HGB 14.1 03/15/2018   HCT 44.4 03/15/2018   MCV 89.0 03/15/2018   PLT 216 03/15/2018   No results found for: IRON, TIBC, FERRITIN  Attestation Statements:   Reviewed by clinician on day of visit: allergies, medications, problem list, medical history, surgical history,  family history, social history, and previous encounter notes.  Time spent on visit including pre-visit chart review and post-visit charting and care was 31 minutes.   IMichaelene Song, am acting as transcriptionist for Abby Potash, PA-C   I have reviewed the above documentation for accuracy and completeness, and I agree with the above. Abby Potash, PA-C

## 2019-09-12 ENCOUNTER — Other Ambulatory Visit: Payer: Self-pay | Admitting: Family Medicine

## 2019-09-12 DIAGNOSIS — Z1231 Encounter for screening mammogram for malignant neoplasm of breast: Secondary | ICD-10-CM

## 2019-09-16 MED FILL — AMLODIPINE BESYLATE 5 MG TA: 5 | 90 days supply | Qty: 90 | Fill #1

## 2019-09-16 MED FILL — HYDROCHLOROTHIAZIDE 25 MG T: 25 | 90 days supply | Qty: 90 | Fill #1

## 2019-09-16 MED FILL — METFORMIN HCL ER 500 MG TB2: 500 | 90 days supply | Qty: 180 | Fill #2

## 2019-09-17 ENCOUNTER — Other Ambulatory Visit: Payer: 59

## 2019-09-17 ENCOUNTER — Other Ambulatory Visit: Payer: Self-pay

## 2019-09-17 DIAGNOSIS — Z20822 Contact with and (suspected) exposure to covid-19: Secondary | ICD-10-CM | POA: Diagnosis not present

## 2019-09-18 LAB — SARS-COV-2, NAA 2 DAY TAT

## 2019-09-18 LAB — NOVEL CORONAVIRUS, NAA: SARS-CoV-2, NAA: NOT DETECTED

## 2019-09-27 ENCOUNTER — Other Ambulatory Visit: Payer: Self-pay

## 2019-09-27 ENCOUNTER — Encounter: Payer: Self-pay | Admitting: Internal Medicine

## 2019-09-27 ENCOUNTER — Ambulatory Visit: Payer: 59 | Admitting: Internal Medicine

## 2019-09-27 ENCOUNTER — Other Ambulatory Visit: Payer: Self-pay | Admitting: Internal Medicine

## 2019-09-27 VITALS — BP 110/60 | HR 95 | Ht 64.0 in | Wt 269.0 lb

## 2019-09-27 DIAGNOSIS — E042 Nontoxic multinodular goiter: Secondary | ICD-10-CM | POA: Diagnosis not present

## 2019-09-27 DIAGNOSIS — E0590 Thyrotoxicosis, unspecified without thyrotoxic crisis or storm: Secondary | ICD-10-CM | POA: Diagnosis not present

## 2019-09-27 DIAGNOSIS — E1165 Type 2 diabetes mellitus with hyperglycemia: Secondary | ICD-10-CM

## 2019-09-27 LAB — POCT GLYCOSYLATED HEMOGLOBIN (HGB A1C): Hemoglobin A1C: 6.8 % — AB (ref 4.0–5.6)

## 2019-09-27 LAB — TSH: TSH: 0.57 u[IU]/mL (ref 0.35–4.50)

## 2019-09-27 LAB — T3, FREE: T3, Free: 4.2 pg/mL (ref 2.3–4.2)

## 2019-09-27 LAB — T4, FREE: Free T4: 0.94 ng/dL (ref 0.60–1.60)

## 2019-09-27 MED ORDER — GLIPIZIDE 5 MG PO TABS: 5.0000 mg | ORAL_TABLET | Freq: Every day | ORAL | 3 refills | Status: DC

## 2019-09-27 MED ORDER — METFORMIN HCL ER 500 MG PO TB24: 1000.0000 mg | ORAL_TABLET | Freq: Every day | ORAL | 3 refills | Status: DC

## 2019-09-27 MED FILL — glipiZIDE 5 MG TABS: 5 | 90 days supply | Qty: 180 | Fill #0

## 2019-09-27 MED FILL — POTASSIUM CHLORIDE CRYS ER: 20 | 90 days supply | Qty: 90 | Fill #1

## 2019-09-27 NOTE — Patient Instructions (Signed)
Please continue: - Metformin ER 1000 mg with dinner - Glipizide 5 mg-10 mg before a late or large dinner. - Ozempic 1 mg weekly  Please stop at the lab.  Please return in 4 months with your sugar log.

## 2019-09-27 NOTE — Progress Notes (Signed)
Patient ID: Veronica Russell, female   DOB: 06/22/1960, 59 y.o.   MRN: [id number]    This visit occurred during the SARS-CoV-2 public health emergency.  Safety protocols were in place, including screening questions prior to the visit, additional usage of staff PPE, and extensive cleaning of exam room while observing appropriate contact time as indicated for disinfecting solutions.   HPI  Veronica Russell is a 59 y.o.-year-old female, returning for follow-up for h/o subclinical hyperthyroidism, right thyroid nodule, and DM2, non-insulin-dependent.  Last visit 4 months ago.  Subclinical hyperthyroidism:  Patient was found to have a slightly low TSH at her first visit in the weight loss clinic in 01/2016.  Her TFTs normalized after 2018.  Reviewed her TFTs: Lab Results  Component Value Date   TSH 0.63 09/20/2018   TSH 0.50 10/05/2017   TSH 0.52 04/04/2017   TSH 0.436 (L) 09/29/2016   TSH 0.443 (L) 05/09/2016   TSH 0.425 (L) 01/27/2016   FREET4 0.80 09/20/2018   FREET4 0.75 10/05/2017   FREET4 0.75 04/04/2017   FREET4 1.17 09/29/2016   FREET4 1.24 05/09/2016   FREET4 1.17 01/27/2016    Lab Results  Component Value Date   T3FREE 3.4 09/20/2018   T3FREE 3.9 10/05/2017   T3FREE 3.7 04/04/2017    She continues to have: Hot flashes Palpitations with exertion Hair loss Chronic tremors (hereditary)  Pt does have a FH of thyroid ds. - M aunt. No FH of thyroid cancer. No h/o radiation tx to head or neck.  No seaweed or kelp. No recent contrast studies. No herbal supplements. No Biotin use. No recent steroids use.   Right thyroid nodule:  Reviewed previous investigation: 04/14/2017: Thyroid ultrasound: 2.9 x 2 x 2.7 solid, hypoechoic, right thyroid nodule 05/11/2017: FNA of the dominant thyroid nodule: Benign 06/05/2019: Thyroid ultrasound: Stable nodules  Pt denies: - feeling nodules in neck - hoarseness - dysphagia - choking - SOB with lying down  DM2:  Reviewed HbA1c  levels: Lab Results  Component Value Date   HGBA1C 7.3 (A) 05/23/2019   HGBA1C 7.6 (H) 02/12/2019   HGBA1C 7.1 (A) 01/21/2019   HGBA1C 7.1 (A) 09/20/2018   HGBA1C 6.8 (H) 07/19/2018   HGBA1C 7.2 (H) 03/07/2018   HGBA1C 7.0 (A) 01/10/2018   HGBA1C 7.4 (H) 10/26/2017   HGBA1C 7.1 (A) 10/05/2017   HGBA1C 7.8 05/10/2017   She is on: - Metformin ER 1000 mg with dinner - Victoza 1.2 mg + 5 clicks in a.m. >> 1.8 mg daily >> Ozempic 1 mg weekly - Glipizide 5 to 10 mg after dinner! >>  Before dinner  She checks her sugars twice a day per meter download: - am:  119, 141-166, 173 >> 113-172 >> 112-172, 185 - 2h after b'fast: n/c - lunch: n/c >> 139 >> n/c - 2h after lunch: n/c - dinner: 89, 106. 115 >> 128-129, 158 >> 103-163, 187 >> 110-152 - 2h after dinner: 129, 154-218, 237 >> 101-233 >> 110-181, 209 - bedtime: n/c Lowest: 101 >> 110 ; hypoglycemia awareness in the 70s. Highest: 390 (40 mg Prednisone) >> ... 233 >> 209.  Meter: Accu-Chek  No CKD: Lab Results  Component Value Date   BUN 11 06/10/2019   Lab Results  Component Value Date   CREATININE 0.75 06/10/2019  On losartan.  + HL: Lab Results  Component Value Date   CHOL 173 06/10/2019   HDL 53 06/10/2019   LDLCALC 92 06/10/2019   TRIG 161 (H) 06/10/2019  On Lipitor 20.  Last eye exam: 09/2018: No DR, Abnormal intraocular pressure (remeasured IO pressure in 03/2019). Has an appt next mo.  She also has HTN, asthma, vitamin D deficiency.  She also has hair loss and sees Dr. Lois Huxley, dermatologist at [facility].  She specializes in hair loss.  ROS: Constitutional: no weight gain/no weight loss, no fatigue, + subjective hyperthermia, no subjective hypothermia Eyes: no blurry vision, no xerophthalmia ENT: no sore throat, + see HPI Cardiovascular: no CP/no SOB/+ palpitations/no leg swelling Respiratory: no cough/no SOB/no wheezing Gastrointestinal: no N/no V/no D/no C/no acid reflux Musculoskeletal: no  muscle aches/no joint aches Skin: no rashes, + hair loss Neurological: + tremor (L>R)/no numbness/no tingling/no dizziness  I reviewed pt's medications, allergies, PMH, social hx, family hx, and changes were documented in the history of present illness. Otherwise, unchanged from my initial visit note.  Past Medical History:  Diagnosis Date   Allergy    Alopecia    Asthma    Back pain    Blood transfusion without reported diagnosis    with hysterectomy    Diabetes ([place])    Diabetes mellitus ([place])    GERD (gastroesophageal reflux disease)    Glaucoma    forming but no treatment- close evaluation per eye md    Hyperlipidemia    Hypertension    Joint pain    Lactose intolerance    Obesity    Raynauds syndrome    Swelling    Tremor    Vitamin D deficiency    Past Surgical History:  Procedure Laterality Date   ABDOMINAL HYSTERECTOMY     BREAST BIOPSY     COLONOSCOPY     MYOMECTOMY     POLYPECTOMY     Social History   Socioeconomic History   Marital status: Single    Spouse name: Not on file   Number of children: no  Social Needs  Occupational History   Occupation: Corporate treasurer    Employer: [employer]  Tobacco Use   Smoking status: Never Smoker   Smokeless tobacco: Never Used  Substance and Sexual Activity   Alcohol use:  Wine, 2-3 times a year, 1 drink   Drug use: No   Current Outpatient Medications on File Prior to Visit  Medication Sig Dispense Refill   albuterol (VENTOLIN HFA) 108 (90 Base) MCG/ACT inhaler Inhale 2 puffs into the lungs every 6 (six) hours as needed for wheezing or shortness of breath. 1 Inhaler 0   Alcohol Swabs (ALCOHOL WIPES) 70 % PADS 30 Packages by Does not apply route 2 (two) times daily. 100 each 0   amLODipine (NORVASC) 5 MG tablet Take 5 mg by mouth daily.     atorvastatin (LIPITOR) 20 MG tablet Take 20 mg by mouth daily.     Blood Glucose Monitoring Suppl (FREESTYLE FREEDOM LITE) w/Device KIT Use  to check blood sugars 1 kit 0   Cholecalciferol 4000 units CAPS Take 4,000 Units by mouth daily.      glipiZIDE (GLUCOTROL) 5 MG tablet Take 1-2 tablets (5-10 mg total) by mouth daily before supper. 90 tablet 3   glucose blood (FREESTYLE LITE) test strip Use to check blood sugar 2 times a day. 200 each 12   hydrochlorothiazide (HYDRODIURIL) 25 MG tablet Take 25 mg by mouth every morning.  1   Lancets (FREESTYLE) lancets Use to check blood sugar 2 times a day. 200 each 12   losartan (COZAAR) 50 MG tablet Take 50 mg by mouth  daily.      Melatonin 3 MG TABS Take 1 tablet by mouth at bedtime as needed.     metFORMIN (GLUCOPHAGE-XR) 500 MG 24 hr tablet Take 2 tablets (1,000 mg total) by mouth at bedtime. 180 tablet 3   montelukast (SINGULAIR) 10 MG tablet Take 10 mg by mouth at bedtime.     propranolol (INDERAL) 20 MG tablet Take 20 mg by mouth daily.      Semaglutide, 1 MG/DOSE, (OZEMPIC, 1 MG/DOSE,) 4 MG/3ML SOPN Inject 1 mg into the skin once a week. 9 mL 3   No current facility-administered medications on file prior to visit.   Allergies  Allergen Reactions   Erythromycin Nausea Only   Milk-Related Compounds Other (See Comments)    Lactose intollerance   Family History  Problem Relation Age of Onset   Multiple myeloma Mother    Hypertension Mother    Hyperlipidemia Mother    Cancer Mother        multiple Myeloma in remission    Obesity Mother    Hypertension Father    Hyperlipidemia Father    Cancer Father    Prostate cancer Father    Asthma Other    Hyperlipidemia Other    Hypertension Other    Cancer Other    COPD Other    Stroke Other    Breast cancer Maternal Aunt    Breast cancer Paternal Aunt    Breast cancer Maternal Aunt    Colon cancer Brother 74   Colon polyps Brother    Esophageal cancer Neg Hx    Rectal cancer Neg Hx    Stomach cancer Neg Hx     PE: BP 110/60    Pulse 95    Ht $R'5\' 4"'TS$  (1.626 m)    Wt 269 lb (122 kg)    LMP  08/31/2008    SpO2 95%    BMI 46.17 kg/m  Wt Readings from Last 3 Encounters:  09/27/19 269 lb (122 kg)  09/05/19 269 lb (122 kg)  08/08/19 267 lb (121.1 kg)   Constitutional: overweight, in NAD Eyes: PERRLA, EOMI, no exophthalmos ENT: moist mucous membranes, no thyromegaly, no cervical lymphadenopathy Cardiovascular: tachycardia, RR, No MRG Respiratory: CTA B Gastrointestinal: abdomen soft, NT, ND, BS+ Musculoskeletal: no deformities, strength intact in all 4 Skin: moist, warm, no rashes Neurological: no tremor with outstretched hands, DTR normal in all 4  ASSESSMENT: 1.  Mild subclinical thyrotoxicosis  2.  Multiple thyroid nodules Thyroid U/S (04/14/2017): FINDINGS: Parenchymal Echotexture: Mildly heterogenous Isthmus: 0.5 cm Right lobe: 4.5 x 2.1 x 1.3 cm Left lobe: 4.3 x 1.4 x 1.6 cm _________________________________________________________  Nodule # 1: Location: Right; Inferior Maximum size: 2.9 cm; Other 2 dimensions: 2.0 x 2.1 cm Composition: solid/almost completely solid (2) Echogenicity: hypoechoic (2) Shape: not taller-than-wide (0) Margins: smooth (0) Echogenic foci: none (0) ACR TI-RADS total points: 4. ACR TI-RADS risk category: TR4 (4-6 points).  ACR TI-RADS recommendations: **Given size (>/= 1.5 cm) and appearance, fine needle aspiration of this moderately suspicious nodule should be considered based on TI-RADS criteria.________________________________________________  Multiple other smaller nodules measure 0.7 cm or less in size and do not meet criteria for biopsy nor follow-up.  IMPRESSION: Right lower pole nodule 1 meets criteria for fine needle aspiration biopsy.   FNA: Adequacy Reason Satisfactory For Evaluation. Diagnosis THYROID, FINE NEEDLE ASPIRATION, RLP (SPECIMEN 1 OF 1,COLLECTED 05/11/17): CONSISTENT WITH BENIGN FOLLICULAR NODULE (BETHESDA CATEGORY II). Enid Cutter MD Pathologist, Electronic Signature (Case signed  05/15/2017) Specimen  Clinical Information Right Inferior, 2.9cm; Other 2 dimensions: 2.0 x 2.1cm, solid/almost completely solid, hypoechoic, TI-RADS total points - 4, Moderately suspicious nodule Source Thyroid, Fine Needle Aspiration, Right, RLP (Specimen 1 of 1, collected on 05/11/17)  Thyroid U/S (06/05/2019): CLINICAL DATA:  Goiter. 59 year old female with a history of thyroid nodules. The 2.9 cm right inferior thyroid nodule was previously biopsied on 05/11/2017  Parenchymal Echotexture: Mildly heterogenous Isthmus: 0.4 cm Right lobe: 4.9 x 2.2 x 1.5 cm Left lobe: 4.0 x 1.6 x 1.3 cm _________________________________________________________  The previously biopsied nodule in the right inferior gland is unchanged at 2.9 x 2.2 x 2.1 cm. Additional small subcentimeter thyroid nodules again noted scattered throughout the left gland. None of these nodules demonstrates significant interval growth or change in morphology. These lesions do not meet criteria for biopsy or dedicated imaging surveillance.  IMPRESSION: No significant interval change in the size or appearance of the previously biopsied 2.9 cm nodule in the right inferior gland.  No new or suspicious thyroid nodules identified.  3. DM2, non-insulin-dependent  PLAN:  1. Patient with history of a slightly low TSH with normal free thyroid hormones (subclinical thyrotoxicosis) with possible thyrotoxic symptoms: Hot flashes (however, she is also postmenopausal), palpitations with exertion, hair loss. -TFTs were normal at last check Lab Results  Component Value Date   TSH 0.63 09/20/2018  -We will continue to follow her TFTs -we will recheck her TFTs today  2.  Multiple thyroid nodules -No neck compression symptoms -She had a solid right 2.9 cm nodule on the ultrasound from 03/2017.  The biopsy of the nodule in 05/2017 and the results were benign.  After last visit, we repeated the thyroid ultrasound and the nodule was  stable.  Also, she has several small nodules not worrisome. -We will continue to follow it expectantly for now  3. DM2 -Patient with uncontrolled type 2 diabetes, on oral medication with Metformin and sulfonylurea and also GLP-1 receptor agonist -She continues to be seen in the weight management clinic -At last visit I advised her to switch from Victoza to Ozempic 1 mg weekly >> no GI sxs with this -I also advised her to take glipizide before a late or large dinner, not after, as she was taking it in the past.  At that time, sugars after dinner were the highest, up to 200s. - at this visit >> sugars are variable and some are above target, but she mentions that she had more stress at home in the last month with family members having COVID-62 and her mother having had a heart attack.  She has not been paying attention to her diet very much but she plans to start doing so.  In this case, we will not change her regimen for now. - I suggested to: Patient Instructions  Please continue: - Metformin ER 1000 mg with dinner - Glipizide 5 mg-10 mg before a late or large dinner. - Ozempic 1 mg weekly  Please return in 4 months with your sugar log.   - we checked her HbA1c: 6.8% (lower) - advised to check sugars at different times of the day - 1-2x a day, rotating check times - advised for yearly eye exams >> she is UTD - return to clinic in 4 months  Component     Latest Ref Rng & Units 09/27/2019  T4,Free(Direct)     0.60 - 1.60 ng/dL 0.94  TSH     0.35 - 4.50 uIU/mL 0.57  Triiodothyronine,Free,Serum  2.3 - 4.2 pg/mL 4.2  Thyroid tests are normal.  Philemon Kingdom, MD PhD [facility] Endocrinology

## 2019-10-01 ENCOUNTER — Other Ambulatory Visit: Payer: Self-pay

## 2019-10-01 ENCOUNTER — Ambulatory Visit
Admission: RE | Admit: 2019-10-01 | Discharge: 2019-10-01 | Disposition: A | Discharge status: Home / Self Care | Payer: 59 | Source: Ambulatory Visit

## 2019-10-01 DIAGNOSIS — Z1231 Encounter for screening mammogram for malignant neoplasm of breast: Secondary | ICD-10-CM | POA: Diagnosis not present

## 2019-10-02 ENCOUNTER — Other Ambulatory Visit: Payer: Self-pay

## 2019-10-02 ENCOUNTER — Other Ambulatory Visit: Payer: 59

## 2019-10-02 DIAGNOSIS — Z20822 Contact with and (suspected) exposure to covid-19: Secondary | ICD-10-CM

## 2019-10-03 ENCOUNTER — Ambulatory Visit (INDEPENDENT_AMBULATORY_CARE_PROVIDER_SITE_OTHER): Payer: 59 | Admitting: Physician Assistant

## 2019-10-03 DIAGNOSIS — Z20822 Contact with and (suspected) exposure to covid-19: Secondary | ICD-10-CM | POA: Diagnosis not present

## 2019-10-03 LAB — NOVEL CORONAVIRUS, NAA: SARS-CoV-2, NAA: NOT DETECTED

## 2019-10-15 DIAGNOSIS — H5213 Myopia, bilateral: Secondary | ICD-10-CM | POA: Diagnosis not present

## 2019-10-15 DIAGNOSIS — E119 Type 2 diabetes mellitus without complications: Secondary | ICD-10-CM | POA: Diagnosis not present

## 2019-10-15 DIAGNOSIS — H40003 Preglaucoma, unspecified, bilateral: Secondary | ICD-10-CM | POA: Diagnosis not present

## 2019-10-15 DIAGNOSIS — H524 Presbyopia: Secondary | ICD-10-CM | POA: Diagnosis not present

## 2019-10-15 DIAGNOSIS — H40053 Ocular hypertension, bilateral: Secondary | ICD-10-CM | POA: Diagnosis not present

## 2019-10-15 DIAGNOSIS — H43813 Vitreous degeneration, bilateral: Secondary | ICD-10-CM | POA: Diagnosis not present

## 2019-10-15 DIAGNOSIS — H52223 Regular astigmatism, bilateral: Secondary | ICD-10-CM | POA: Diagnosis not present

## 2019-10-15 MED FILL — FREESTYLE LITE TEST STRIP: 50 days supply | Qty: 100 | Fill #5

## 2019-11-04 ENCOUNTER — Other Ambulatory Visit: Payer: 59

## 2019-11-04 DIAGNOSIS — Z20822 Contact with and (suspected) exposure to covid-19: Secondary | ICD-10-CM

## 2019-11-06 LAB — SARS-COV-2, NAA 2 DAY TAT

## 2019-11-06 LAB — NOVEL CORONAVIRUS, NAA: SARS-CoV-2, NAA: NOT DETECTED

## 2019-11-08 MED FILL — PROPRANOLOL 20 MG TABLET: 20 | 90 days supply | Qty: 90 | Fill #1

## 2019-11-08 MED FILL — ATORVASTATIN 20 MG TABLET: 20 | 90 days supply | Qty: 90 | Fill #1

## 2019-11-18 MED FILL — OMEPRAZOLE DR 20 MG CAPSULE: 20 | 90 days supply | Qty: 90 | Fill #1

## 2019-11-29 MED FILL — FREESTYLE LITE TEST STRIP: 50 days supply | Qty: 100 | Fill #6

## 2019-11-29 MED FILL — OZEMPIC (1 MG/DOSE) 4 MG/3M: 4 | 84 days supply | Qty: 9 | Fill #1

## 2019-12-16 DIAGNOSIS — Z0000 Encounter for general adult medical examination without abnormal findings: Secondary | ICD-10-CM | POA: Diagnosis not present

## 2019-12-16 DIAGNOSIS — I10 Essential (primary) hypertension: Secondary | ICD-10-CM | POA: Diagnosis not present

## 2019-12-16 DIAGNOSIS — E1169 Type 2 diabetes mellitus with other specified complication: Secondary | ICD-10-CM | POA: Diagnosis not present

## 2019-12-16 DIAGNOSIS — Z1211 Encounter for screening for malignant neoplasm of colon: Secondary | ICD-10-CM | POA: Diagnosis not present

## 2019-12-16 DIAGNOSIS — E0590 Thyrotoxicosis, unspecified without thyrotoxic crisis or storm: Secondary | ICD-10-CM | POA: Diagnosis not present

## 2019-12-16 DIAGNOSIS — J4520 Mild intermittent asthma, uncomplicated: Secondary | ICD-10-CM | POA: Diagnosis not present

## 2019-12-16 DIAGNOSIS — E7800 Pure hypercholesterolemia, unspecified: Secondary | ICD-10-CM | POA: Diagnosis not present

## 2019-12-19 ENCOUNTER — Other Ambulatory Visit (HOSPITAL_COMMUNITY): Payer: Self-pay | Admitting: Family Medicine

## 2019-12-19 MED FILL — MONTELUKAST SOD 10 MG TAB: 10 | 90 days supply | Qty: 90 | Fill #0

## 2019-12-19 MED FILL — LOSARTAN POTASSIUM 50 MG TA: 50 | 90 days supply | Qty: 90 | Fill #0

## 2019-12-23 ENCOUNTER — Other Ambulatory Visit (HOSPITAL_COMMUNITY): Payer: Self-pay | Admitting: Family Medicine

## 2019-12-23 MED FILL — HYDROCHLOROTHIAZIDE 25 MG T: 25 | 90 days supply | Qty: 90 | Fill #0

## 2019-12-23 MED FILL — AMLODIPINE BESYLATE 5 MG TA: 5 | 90 days supply | Qty: 90 | Fill #0

## 2019-12-23 MED FILL — METFORMIN HCL ER 500 MG TB2: 500 | 90 days supply | Qty: 180 | Fill #0

## 2020-01-17 ENCOUNTER — Ambulatory Visit: Payer: 59 | Admitting: Internal Medicine

## 2020-01-17 ENCOUNTER — Encounter: Payer: Self-pay | Admitting: Internal Medicine

## 2020-01-17 ENCOUNTER — Other Ambulatory Visit: Payer: Self-pay

## 2020-01-17 VITALS — BP 130/80 | HR 101 | Ht 64.0 in | Wt 265.0 lb

## 2020-01-17 DIAGNOSIS — E042 Nontoxic multinodular goiter: Secondary | ICD-10-CM

## 2020-01-17 DIAGNOSIS — E0590 Thyrotoxicosis, unspecified without thyrotoxic crisis or storm: Secondary | ICD-10-CM | POA: Diagnosis not present

## 2020-01-17 DIAGNOSIS — E1165 Type 2 diabetes mellitus with hyperglycemia: Secondary | ICD-10-CM

## 2020-01-17 LAB — POCT GLYCOSYLATED HEMOGLOBIN (HGB A1C): Hemoglobin A1C: 7.3 % — AB (ref 4.0–5.6)

## 2020-01-17 MED FILL — FREESTYLE LITE TEST STRIP: 50 days supply | Qty: 100 | Fill #7

## 2020-01-17 NOTE — Patient Instructions (Signed)
Please continue: - Metformin ER 1000 mg with dinner - Glipizide 5 mg (10 mg before a late or large) dinner - Ozempic 1 mg weekly  Please return in 4 months with your sugar log.  

## 2020-01-17 NOTE — Progress Notes (Signed)
Patient ID: [name], female   DOB: Jul 22, 1960, 59 y.o.   MRN: [id number]    This visit occurred during the SARS-CoV-2 public health emergency.  Safety protocols were in place, including screening questions prior to the visit, additional usage of staff PPE, and extensive cleaning of exam room while observing appropriate contact time as indicated for disinfecting solutions.   HPI  Veronica Russell is a 59 y.o.-year-old female, returning for follow-up for h/o subclinical hyperthyroidism, right thyroid nodule, and DM2, non-insulin-dependent.  Last visit 4 months ago.  Subclinical hyperthyroidism:  Patient was found to have a slightly low TSH at her first visit in the weight loss clinic in 01/2016. Her TFTs normalized after 2018.  Reviewed her TFTs: Lab Results  Component Value Date   TSH 0.57 09/27/2019   TSH 0.63 09/20/2018   TSH 0.50 10/05/2017   TSH 0.52 04/04/2017   TSH 0.436 (L) 09/29/2016   TSH 0.443 (L) 05/09/2016   TSH 0.425 (L) 01/27/2016   FREET4 0.94 09/27/2019   FREET4 0.80 09/20/2018   FREET4 0.75 10/05/2017   FREET4 0.75 04/04/2017   FREET4 1.17 09/29/2016   FREET4 1.24 05/09/2016   FREET4 1.17 01/27/2016    Lab Results  Component Value Date   T3FREE 4.2 09/27/2019   T3FREE 3.4 09/20/2018   T3FREE 3.9 10/05/2017   T3FREE 3.7 04/04/2017    She continues to experience chronic: Hot flashes Palpitations with exertion Hair loss Hereditary tremors  Pt does have a FH of thyroid ds. - M aunt. No FH of thyroid cancer. No h/o radiation tx to head or neck.  No seaweed or kelp. No recent contrast studies. No herbal supplements. No Biotin use. No recent steroids use.   Right thyroid nodule:  Reviewed previous investigation: 04/14/2017: Thyroid ultrasound: 2.9 x 2 x 2.7 solid, hypoechoic, right thyroid nodule 05/11/2017: FNA of the dominant thyroid nodule: Benign 06/05/2019: Thyroid ultrasound: Stable nodules  Pt denies: - feeling nodules in neck - hoarseness -  dysphagia - choking - SOB with lying down  DM2:  Reviewed HbA1c levels: Lab Results  Component Value Date   HGBA1C 6.8 (A) 09/27/2019   HGBA1C 7.3 (A) 05/23/2019   HGBA1C 7.6 (H) 02/12/2019   HGBA1C 7.1 (A) 01/21/2019   HGBA1C 7.1 (A) 09/20/2018   HGBA1C 6.8 (H) 07/19/2018   HGBA1C 7.2 (H) 03/07/2018   HGBA1C 7.0 (A) 01/10/2018   HGBA1C 7.4 (H) 10/26/2017   HGBA1C 7.1 (A) 10/05/2017   She is on: - Metformin ER 1000 mg with dinner - Victoza 1.2 mg + 5 clicks in a.m. >> 1.8 mg daily >> Ozempic 1 mg weekly - Glipizide 5 to 10 mg after dinner! >> Moved to before dinner  She checks her sugars twice a day : - am:  113-172 >> 112-172, 185 >> 120-130 - 2h after b'fast: n/c - lunch: n/c >> 139 >> n/c - 2h after lunch: n/c - dinner: 103-163, 187 >> 110-152 >> 104-112 - 2h after dinner: 101-233 >> 110-181, 209 >> 150-165 - bedtime: n/c Lowest: 101 >> 110 >> 104 ; hypoglycemia awareness in the 70s. Highest: 390 (40 mg Prednisone) >> ... 233 >> 209 >> 215 (late dinner).  Meter: Accu-Chek  No CKD: Lab Results  Component Value Date   BUN 11 06/10/2019   Lab Results  Component Value Date   CREATININE 0.75 06/10/2019  On losartan.  + HL: Lab Results  Component Value Date   CHOL 173 06/10/2019   HDL 53 06/10/2019  LDLCALC 92 06/10/2019   TRIG 161 (H) 06/10/2019  On Lipitor 20.  Last eye exam: 10/2019: No DR, but has abnormal intraocular pressures (remeasured IO pressure in 03/2019).   She also has a history of HTN, asthma, vitamin D deficiency.  She also has hair loss and sees Dr. Lois Huxley, dermatologist at [facility].  She specializes in hair loss.  ROS: Constitutional: no weight gain/no weight loss, no fatigue, + subjective hyperthermia, no subjective hypothermia Eyes: no blurry vision, no xerophthalmia ENT: no sore throat, + see HPI Cardiovascular: no CP/no SOB/no palpitations/no leg swelling Respiratory: no cough/no SOB/no wheezing Gastrointestinal: no  N/no V/no D/no C/no acid reflux Musculoskeletal: no muscle aches/no joint aches Skin: no rashes, + hair loss Neurological:+ tremors/no numbness/no tingling/no dizziness  I reviewed pt's medications, allergies, PMH, social hx, family hx, and changes were documented in the history of present illness. Otherwise, unchanged from my initial visit note.  Past Medical History:  Diagnosis Date  . Allergy   . Alopecia   . Asthma   . Back pain   . Blood transfusion without reported diagnosis    with hysterectomy   . Diabetes ([place])   . Diabetes mellitus ([place])   . GERD (gastroesophageal reflux disease)   . 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 Hyperlipidemia   . Hypertension   . Joint pain   . Lactose intolerance   . Obesity   . Raynauds syndrome   . Swelling   . Tremor   . Vitamin D deficiency    Past Surgical History:  Procedure Laterality Date  . ABDOMINAL HYSTERECTOMY    . BREAST BIOPSY    . COLONOSCOPY    . MYOMECTOMY    . POLYPECTOMY     Social History   Socioeconomic History  . Marital status: Single    Spouse name: Not on file  . Number of children: no  Social Needs  Occupational History  . Occupation: [employer]    Employer: [employer]  Tobacco Use  . Smoking status: Never Smoker  . Smokeless tobacco: Never Used  Substance and Sexual Activity  . Alcohol use:  Wine, 2-3 times a year, 1 drink  . Drug use: No   Current Outpatient Medications on File Prior to Visit  Medication Sig Dispense Refill  . albuterol (VENTOLIN HFA) 108 (90 Base) MCG/ACT inhaler Inhale 2 puffs into the lungs every 6 (six) hours as needed for wheezing or shortness of breath. 1 Inhaler 0  . Alcohol Swabs (ALCOHOL WIPES) 70 % PADS 30 Packages by Does not apply route 2 (two) times daily. 100 each 0  . amLODipine (NORVASC) 5 MG tablet Take 5 mg by mouth daily.    Marland Kitchen atorvastatin (LIPITOR) 20 MG tablet Take 20 mg by mouth daily.    . Blood Glucose Monitoring  Suppl (FREESTYLE FREEDOM LITE) w/Device KIT Use to check blood sugars 1 kit 0  . Cholecalciferol 4000 units CAPS Take 4,000 Units by mouth daily.     Marland Kitchen glipiZIDE (GLUCOTROL) 5 MG tablet Take 1-2 tablets (5-10 mg total) by mouth daily before supper. 180 tablet 3  . glucose blood (FREESTYLE LITE) test strip Use to check blood sugar 2 times a day. 200 each 12  . hydrochlorothiazide (HYDRODIURIL) 25 MG tablet Take 25 mg by mouth every morning.  1  . Lancets (FREESTYLE) lancets Use to check blood sugar 2 times a day. 200 each 12  . losartan (  COZAAR) 50 MG tablet Take 50 mg by mouth daily.     . Melatonin 3 MG TABS Take 1 tablet by mouth at bedtime as needed.    . metFORMIN (GLUCOPHAGE-XR) 500 MG 24 hr tablet Take 2 tablets (1,000 mg total) by mouth at bedtime. 180 tablet 3  . montelukast (SINGULAIR) 10 MG tablet Take 10 mg by mouth at bedtime.    . propranolol (INDERAL) 20 MG tablet Take 20 mg by mouth daily.     . Semaglutide, 1 MG/DOSE, (OZEMPIC, 1 MG/DOSE,) 4 MG/3ML SOPN Inject 1 mg into the skin once a week. 9 mL 3   No current facility-administered medications on file prior to visit.   Allergies  Allergen Reactions  . Erythromycin Nausea Only  . Milk-Related Compounds Other (See Comments)    Lactose intollerance   Family History  Problem Relation Age of Onset  . Multiple myeloma Mother   . Hypertension Mother   . Hyperlipidemia Mother   . Cancer Mother        multiple Myeloma in remission   . Obesity Mother   . Hypertension Father   . Hyperlipidemia Father   . Cancer Father   . Prostate cancer Father   . Asthma Other   . Hyperlipidemia Other   . Hypertension Other   . Cancer Other   . COPD Other   . Stroke Other   . Breast cancer Maternal Aunt   . Breast cancer Paternal Aunt   . Breast cancer Maternal Aunt   . Colon cancer Brother 69  . Colon polyps Brother   . Esophageal cancer Neg Hx   . Rectal cancer Neg Hx   . Stomach cancer Neg Hx     PE: LMP 08/31/2008  Wt  Readings from Last 3 Encounters:  09/27/19 269 lb (122 kg)  09/05/19 269 lb (122 kg)  08/08/19 267 lb (121.1 kg)   Constitutional: overweight, in NAD Eyes: PERRLA, EOMI, no exophthalmos ENT: moist mucous membranes, no thyromegaly, no cervical lymphadenopathy Cardiovascular: RRR, No MRG Respiratory: CTA B Gastrointestinal: abdomen soft, NT, ND, BS+ Musculoskeletal: no deformities, strength intact in all 4 Skin: moist, warm, no rashes Neurological: no tremor with outstretched hands, DTR normal in all 4  ASSESSMENT: 1.  Mild subclinical thyrotoxicosis  2.  Multiple thyroid nodules Thyroid U/S (04/14/2017): FINDINGS: Parenchymal Echotexture: Mildly heterogenous Isthmus: 0.5 cm Right lobe: 4.5 x 2.1 x 1.3 cm Left lobe: 4.3 x 1.4 x 1.6 cm _________________________________________________________  Nodule # 1: Location: Right; Inferior Maximum size: 2.9 cm; Other 2 dimensions: 2.0 x 2.1 cm Composition: solid/almost completely solid (2) Echogenicity: hypoechoic (2) Shape: not taller-than-wide (0) Margins: smooth (0) Echogenic foci: none (0) ACR TI-RADS total points: 4. ACR TI-RADS risk category: TR4 (4-6 points).  ACR TI-RADS recommendations: **Given size (>/= 1.5 cm) and appearance, fine needle aspiration of this moderately suspicious nodule should be considered based on TI-RADS criteria.________________________________________________  Multiple other smaller nodules measure 0.7 cm or less in size and do not meet criteria for biopsy nor follow-up.  IMPRESSION: Right lower pole nodule 1 meets criteria for fine needle aspiration biopsy.   FNA: Adequacy Reason Satisfactory For Evaluation. Diagnosis THYROID, FINE NEEDLE ASPIRATION, RLP (SPECIMEN 1 OF 1,COLLECTED 05/11/17): CONSISTENT WITH BENIGN FOLLICULAR NODULE (BETHESDA CATEGORY II). Veronica Cutter MD Pathologist, Electronic Signature (Case signed 05/15/2017) Specimen Clinical Information Right Inferior, 2.9cm; Other 2  dimensions: 2.0 x 2.1cm, solid/almost completely solid, hypoechoic, TI-RADS total points - 4, Moderately suspicious nodule Source Thyroid, Fine Needle Aspiration, Right,  RLP (Specimen 1 of 1, collected on 05/11/17)  Thyroid U/S (06/05/2019): CLINICAL DATA:  Goiter. 59 year old female with a history of thyroid nodules. The 2.9 cm right inferior thyroid nodule was previously biopsied on 05/11/2017  Parenchymal Echotexture: Mildly heterogenous Isthmus: 0.4 cm Right lobe: 4.9 x 2.2 x 1.5 cm Left lobe: 4.0 x 1.6 x 1.3 cm _________________________________________________________  The previously biopsied nodule in the right inferior gland is unchanged at 2.9 x 2.2 x 2.1 cm. Additional small subcentimeter thyroid nodules again noted scattered throughout the left gland. None of these nodules demonstrates significant interval growth or change in morphology. These lesions do not meet criteria for biopsy or dedicated imaging surveillance.  IMPRESSION: No significant interval change in the size or appearance of the previously biopsied 2.9 cm nodule in the right inferior gland.  No new or suspicious thyroid nodules identified.  3. DM2, non-insulin-dependent  PLAN:  1. Patient with history of a slightly low TSH with normal free thyroid hormones (subclinical hyperthyroidism) with possible thyrotoxic symptoms: Hot flashes (however, she is also postmenopausal), palpitations with exertion, hair loss. -TFTs were normal at last visit: Lab Results  Component Value Date   TSH 0.57 09/27/2019  -We will repeat her TFTs at next visit  2.  Multiple thyroid nodules -She denies neck compression symptoms -She has a solid, right, 2.9 cm nodule on the ultrasound from 03/2017. Biopsy of this nodule in 05/2017 was benign. We repeated a thyroid ultrasound in 06/2019 and the nodule was stable. She also has several small nodules, not worrisome. -We will continue to follow this expectantly for now  3.  DM2 -Patient with uncontrolled type 2 diabetes, with improved control at last visit, on oral medication with Metformin and sulfonylurea and also weekly GLP-1 receptor agonist. In the past, we switched from Victoza to [place] which improved controlled afterwards. He has no GI symptoms with Ozempic. At last visit, sugars were variable and somewhere above target, due to increased stress at home and not paying much attention to her diet. We did not change her regimen at that time. I did advise her to take glipizide before, rather than after meals. HbA1c at last visit was improved, at 6.8% - at today's visit, her sugars at home are all at goal, without significant hyper or hypoglycemia.  They have improved from last visit.  Her estimated HbA1c is approximately 6.5%.  However, at today's visit, wechecked her HbA1c: 7.3% (higher), which does not appear to be very accurate for her.  If at next visit HbA1c is not correlating better with her sugars at home, we will check a fructosamine level.  For now, I do not feel we need to change her regimen. - I suggested to: Patient Instructions  Please continue: - Metformin ER 1000 mg with dinner - Glipizide 5 mg-10 mg before a late or large dinner - Ozempic 1 mg weekly  Please return in 4 months with your sugar log.   - advised to check sugars at different times of the day - 1-2x a day, rotating check times - advised for yearly eye exams >> she is UTD - return to clinic in 4 months   Philemon Kingdom, MD PhD [facility]

## 2020-01-17 NOTE — Addendum Note (Signed)
Addended by: Lauralyn Primes on: 01/17/2020 10:05 AM   Modules accepted: Orders

## 2020-01-27 IMAGING — MG DIGITAL SCREENING BILATERAL MAMMOGRAM WITH TOMO AND CAD
8 series · 8 of 24 positions shown · non-contrast
Comparison: Previous exam(s).

ACR Breast Density Category a: The breast tissue is almost entirely
fatty.

CLINICAL DATA: Screening.

EXAM:
DIGITAL SCREENING BILATERAL MAMMOGRAM WITH TOMO AND CAD

[L CC synth-2D]
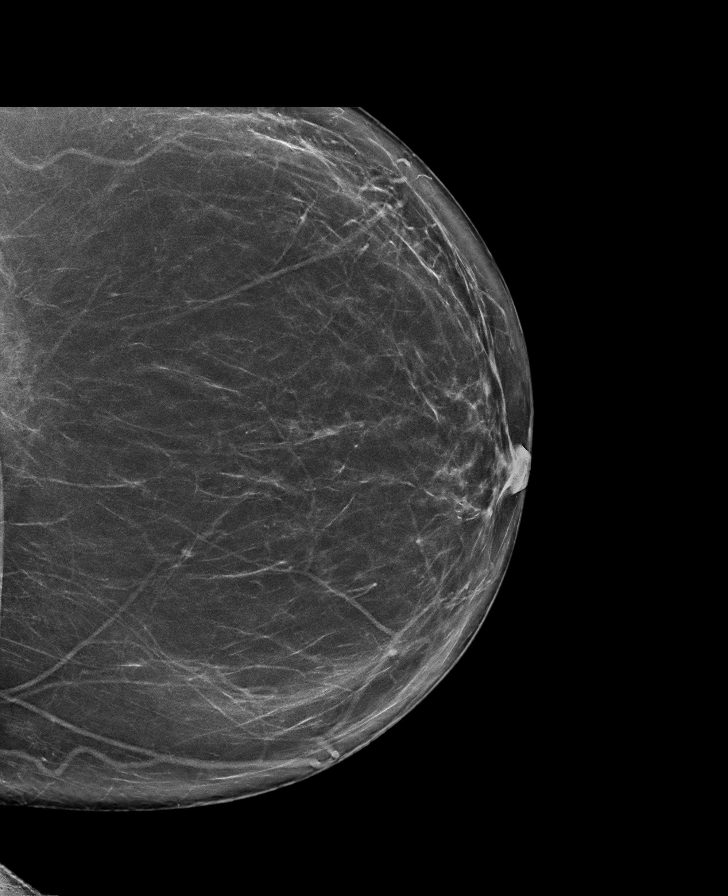

[R MLO synth-2D]
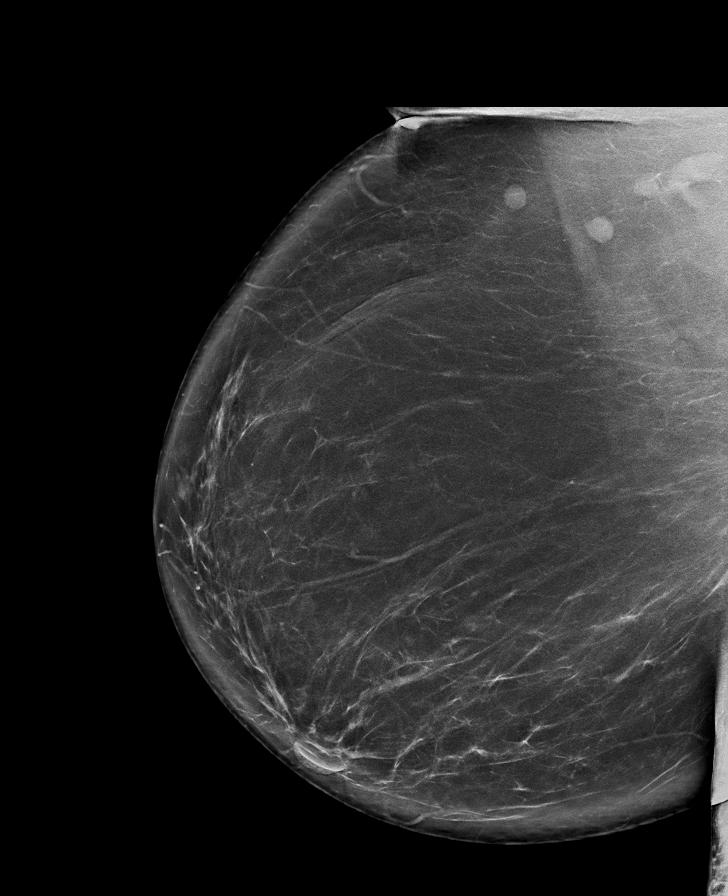

[R CC synth-2D]
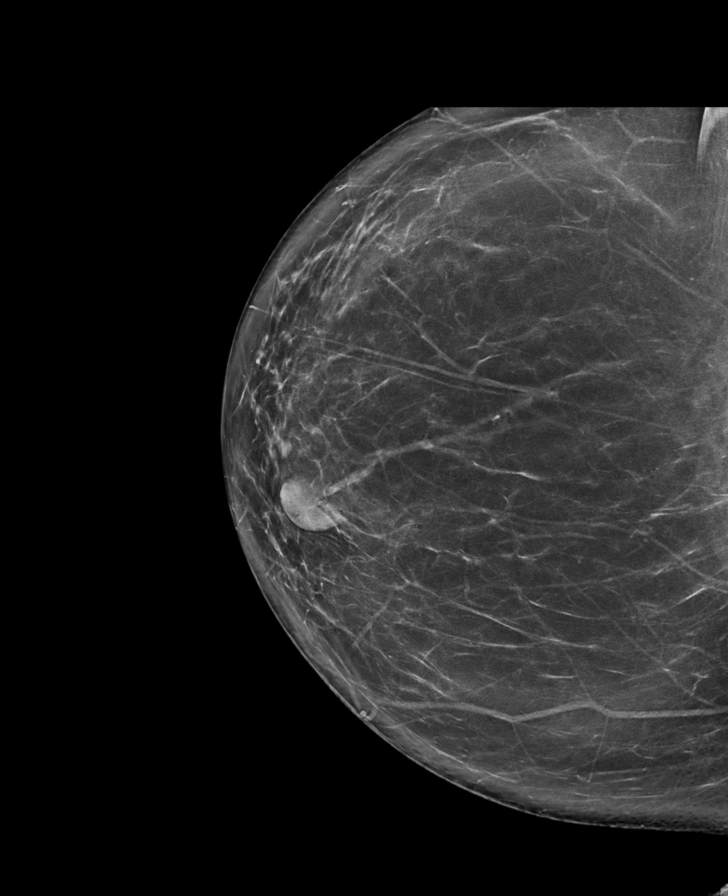

[L MLO synth-2D]
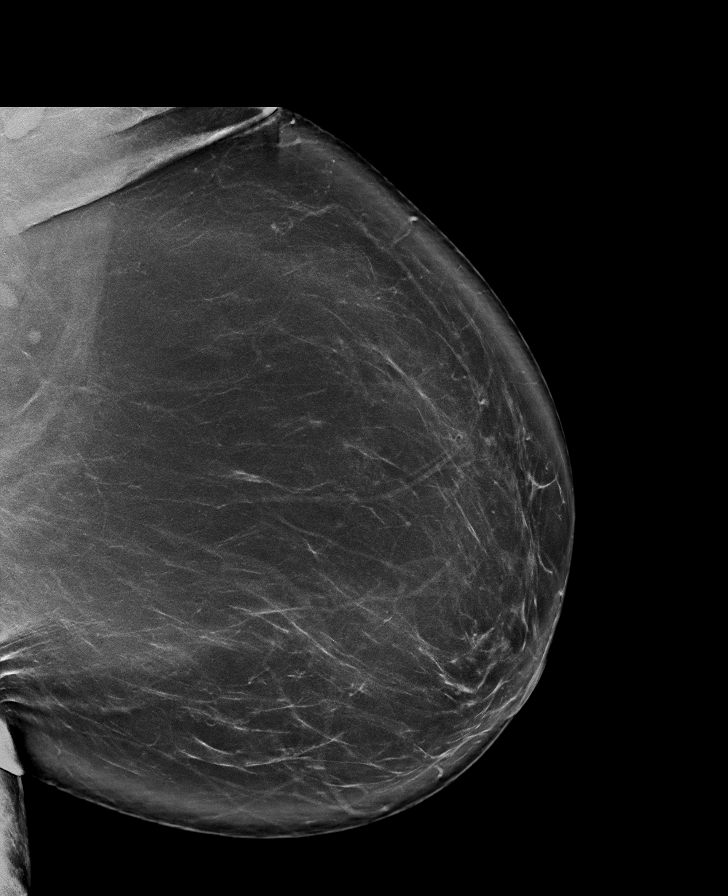

[R MLO tomo · tomo slice 53/106.0]
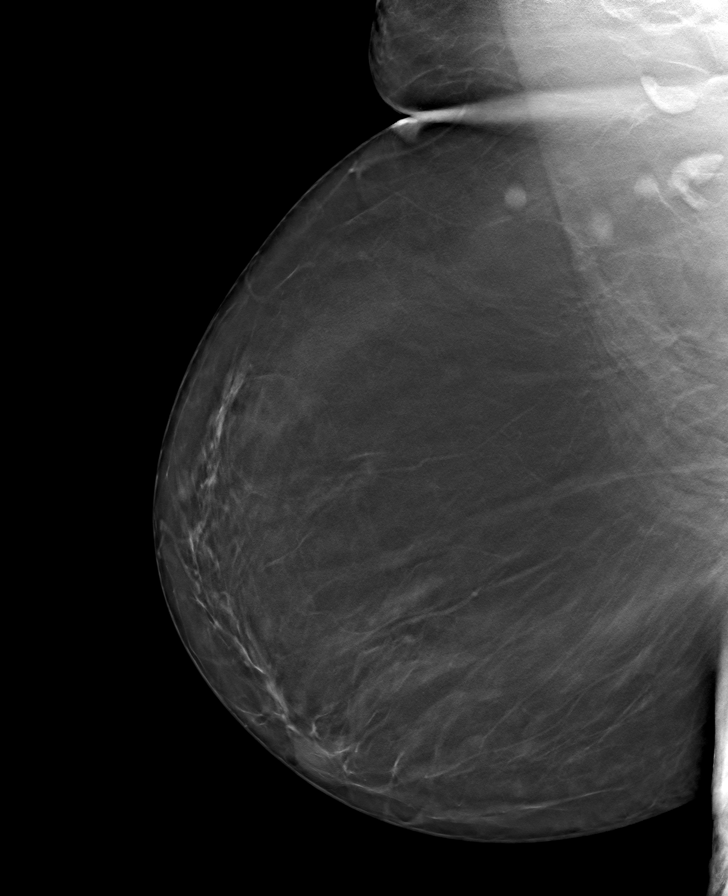

[L MLO tomo · tomo slice 55/108.0]
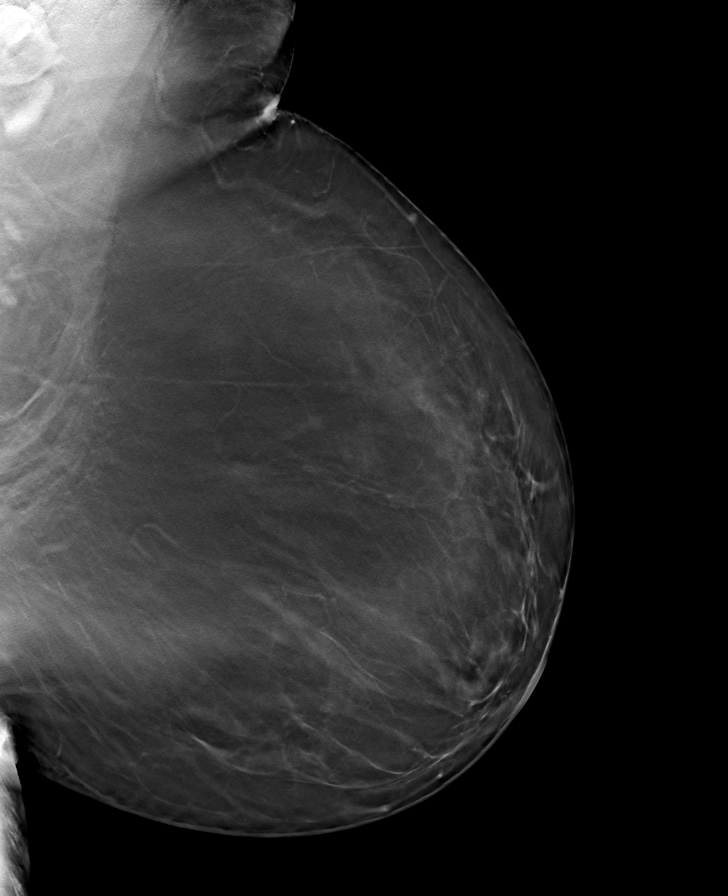

[L CC tomo · tomo slice 44/87.0]
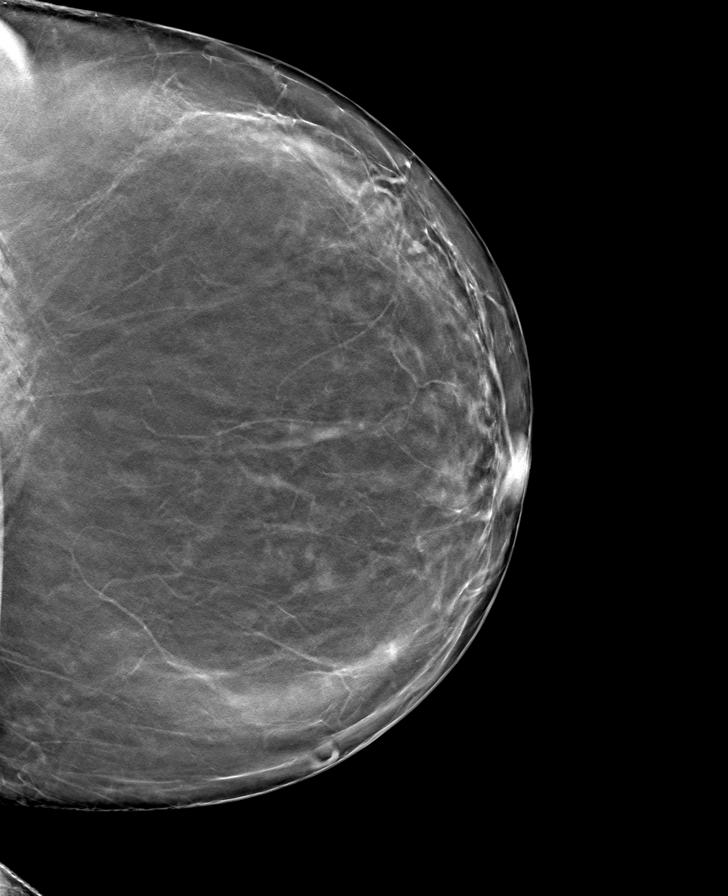

[R CC tomo · tomo slice 45/89.0]
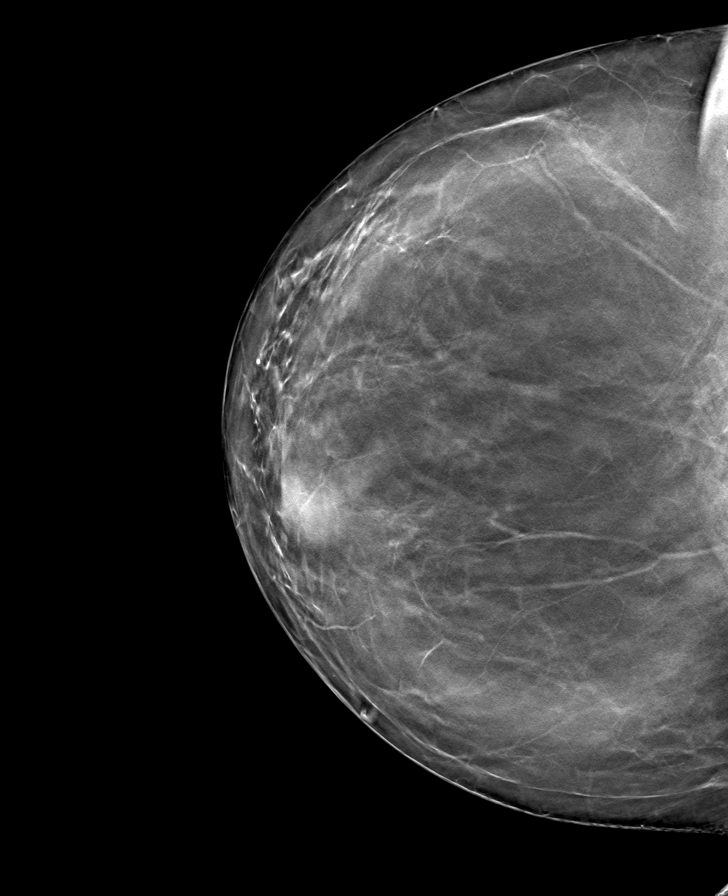

[8 of 24 positions shown; findings below may reference images not displayed]

FINDINGS: There are no findings suspicious for malignancy. Images were
processed with CAD.
IMPRESSION: No mammographic evidence of malignancy. A result letter of this
screening mammogram will be mailed directly to the patient.

RECOMMENDATION:
Screening mammogram in one year. (Code:[id number]-[id number])

BI-RADS CATEGORY  1: Negative.

## 2020-02-07 ENCOUNTER — Other Ambulatory Visit (HOSPITAL_COMMUNITY): Payer: Self-pay | Admitting: Family Medicine

## 2020-02-07 MED FILL — ATORVASTATIN CALCIUM 20 MG: 20 | 90 days supply | Qty: 90 | Fill #0

## 2020-02-07 MED FILL — PROPRANOLOL 20 MG TABLET: 20 | 90 days supply | Qty: 90 | Fill #0

## 2020-02-11 ENCOUNTER — Encounter: Payer: Self-pay | Admitting: Gastroenterology

## 2020-02-12 ENCOUNTER — Telehealth: Payer: Self-pay | Admitting: Internal Medicine

## 2020-02-12 NOTE — Telephone Encounter (Signed)
Good afternoon Dr. Hilarie Fredrickson, this patient was a previous patient of Dr. Sharlett Iles.  She is requesting to have her colonoscopy done by you.  Is ok if I schedule her?

## 2020-02-12 NOTE — Telephone Encounter (Signed)
Yes, ok with me 

## 2020-02-13 NOTE — Telephone Encounter (Signed)
Called patient and left voice mail to call back to schedule procedure.  

## 2020-02-20 ENCOUNTER — Other Ambulatory Visit (HOSPITAL_COMMUNITY): Payer: Self-pay | Admitting: Family Medicine

## 2020-02-20 MED FILL — OMEPRAZOLE DR 20 MG CAPSULE: 20 | 90 days supply | Qty: 90 | Fill #0

## 2020-02-20 MED FILL — OZEMPIC (1 MG/DOSE) 4 MG/3M: 4 | 84 days supply | Qty: 9 | Fill #2

## 2020-03-11 ENCOUNTER — Other Ambulatory Visit: Payer: Self-pay | Admitting: Internal Medicine

## 2020-03-11 MED FILL — MONTELUKAST SOD 10 MG TAB: 10 | 90 days supply | Qty: 90 | Fill #1

## 2020-03-11 MED FILL — LOSARTAN POTASSIUM 50 MG TA: 50 | 30 days supply | Qty: 30 | Fill #1

## 2020-03-12 ENCOUNTER — Other Ambulatory Visit: Payer: Self-pay | Admitting: Internal Medicine

## 2020-03-12 MED FILL — FREESTYLE LITE TEST STRIP: 50 days supply | Qty: 100 | Fill #0

## 2020-03-16 MED FILL — AMLODIPINE BESYLATE 5 MG TA: 5 | 90 days supply | Qty: 90 | Fill #1

## 2020-03-16 MED FILL — METFORMIN HCL ER 500 MG TB2: 500 | 90 days supply | Qty: 180 | Fill #1

## 2020-03-16 MED FILL — HYDROCHLOROTHIAZIDE 25 MG T: 25 | 90 days supply | Qty: 90 | Fill #1

## 2020-04-06 MED FILL — LOSARTAN POTASSIUM 50 MG TA: 50 | 30 days supply | Qty: 30 | Fill #2

## 2020-04-07 ENCOUNTER — Other Ambulatory Visit: Payer: Self-pay | Admitting: Internal Medicine

## 2020-04-07 ENCOUNTER — Other Ambulatory Visit: Payer: Self-pay

## 2020-04-07 ENCOUNTER — Ambulatory Visit (AMBULATORY_SURGERY_CENTER): Payer: 59

## 2020-04-07 VITALS — Ht 64.0 in | Wt 255.0 lb

## 2020-04-07 DIAGNOSIS — Z86010 Personal history of colonic polyps: Secondary | ICD-10-CM

## 2020-04-07 DIAGNOSIS — Z800 Family history of malignant neoplasm of digestive organs: Secondary | ICD-10-CM

## 2020-04-07 MED ORDER — NA SULFATE-K SULFATE-MG SULF 17.5-3.13-1.6 GM/177ML PO SOLN: 1.0000 | Freq: Once | ORAL | 0 refills | Status: DC

## 2020-04-07 MED FILL — SUPREP BOWEL PREP KIT: 17.5-3.13-1 | 1 days supply | Qty: 354 | Fill #0

## 2020-04-07 NOTE — Progress Notes (Signed)
No allergies to soy or egg Pt is not on blood thinners or diet pills Denies issues with sedation/intubation Denies atrial flutter/fib Denies constipation   Emmi instructions given to pt  Pt is aware of Covid safety and care partner requirements.  

## 2020-04-08 ENCOUNTER — Encounter: Payer: Self-pay | Admitting: Internal Medicine

## 2020-04-10 ENCOUNTER — Encounter: Payer: 59 | Admitting: Gastroenterology

## 2020-04-21 ENCOUNTER — Encounter: Payer: Self-pay | Admitting: Internal Medicine

## 2020-04-21 ENCOUNTER — Ambulatory Visit (AMBULATORY_SURGERY_CENTER): Payer: 59 | Admitting: Internal Medicine

## 2020-04-21 VITALS — BP 121/83 | HR 100 | Temp 97.8°F | Resp 14 | Ht 64.0 in | Wt 255.0 lb

## 2020-04-21 DIAGNOSIS — Z86010 Personal history of colonic polyps: Secondary | ICD-10-CM | POA: Diagnosis not present

## 2020-04-21 DIAGNOSIS — D122 Benign neoplasm of ascending colon: Secondary | ICD-10-CM

## 2020-04-21 DIAGNOSIS — D127 Benign neoplasm of rectosigmoid junction: Secondary | ICD-10-CM | POA: Diagnosis not present

## 2020-04-21 DIAGNOSIS — Z1211 Encounter for screening for malignant neoplasm of colon: Secondary | ICD-10-CM | POA: Diagnosis not present

## 2020-04-21 DIAGNOSIS — Z800 Family history of malignant neoplasm of digestive organs: Secondary | ICD-10-CM

## 2020-04-21 DIAGNOSIS — D120 Benign neoplasm of cecum: Secondary | ICD-10-CM

## 2020-04-21 MED ORDER — SODIUM CHLORIDE 0.9 % IV SOLN: 500.0000 mL | Freq: Once | INTRAVENOUS | Status: DC

## 2020-04-21 NOTE — Progress Notes (Signed)
C.W. vital signs. 

## 2020-04-21 NOTE — Progress Notes (Signed)
Called to room to assist during endoscopic procedure.  Patient ID and intended procedure confirmed with present staff. Received instructions for my participation in the procedure from the performing physician.  

## 2020-04-21 NOTE — Patient Instructions (Signed)
.  Handout on polyps, diverticulosis given.    YOU HAD AN ENDOSCOPIC PROCEDURE TODAY AT THE [facility]:   Refer to the procedure report that was given to you for any specific questions about what was found during the examination.  If the procedure report does not answer your questions, please call your gastroenterologist to clarify.  If you requested that your care partner not be given the details of your procedure findings, then the procedure report has been included in a sealed envelope for you to review at your convenience later.  YOU SHOULD EXPECT: Some feelings of bloating in the abdomen. Passage of more gas than usual.  Walking can help get rid of the air that was put into your GI tract during the procedure and reduce the bloating. If you had a lower endoscopy (such as a colonoscopy or flexible sigmoidoscopy) you may notice spotting of blood in your stool or on the toilet paper. If you underwent a bowel prep for your procedure, you may not have a normal bowel movement for a few days.  Please Note:  You might notice some irritation and congestion in your nose or some drainage.  This is from the oxygen used during your procedure.  There is no need for concern and it should clear up in a day or so.  SYMPTOMS TO REPORT IMMEDIATELY:   Following lower endoscopy (colonoscopy or flexible sigmoidoscopy):  Excessive amounts of blood in the stool  Significant tenderness or worsening of abdominal pains  Swelling of the abdomen that is new, acute  Fever of 100F or higher   For urgent or emergent issues, a gastroenterologist can be reached at any hour by calling [phone]. Do not use MyChart messaging for urgent concerns.    DIET:  We do recommend a small meal at first, but then you may proceed to your regular diet.  Drink plenty of fluids but you should avoid alcoholic beverages for 24 hours.  ACTIVITY:  You should plan to take it easy for the rest of today and you should NOT  DRIVE or use heavy machinery until tomorrow (because of the sedation medicines used during the test).    FOLLOW UP: Our staff will call the number listed on your records 48-72 hours following your procedure to check on you and address any questions or concerns that you may have regarding the information given to you following your procedure. If we do not reach you, we will leave a message.  We will attempt to reach you two times.  During this call, we will ask if you have developed any symptoms of COVID 19. If you develop any symptoms (ie: fever, flu-like symptoms, shortness of breath, cough etc.) before then, please call [phone].  If you test positive for Covid 19 in the 2 weeks post procedure, please call and report this information to us.    If any biopsies were taken you will be contacted by phone or by letter within the next 1-3 weeks.  Please call us at [phone] if you have not heard about the biopsies in 3 weeks.    SIGNATURES/CONFIDENTIALITY: You and/or your care partner have signed paperwork which will be entered into your electronic medical record.  These signatures attest to the fact that that the information above on your After Visit Summary has been reviewed and is understood.  Full responsibility of the confidentiality of this discharge information lies with you and/or your care-partner. 

## 2020-04-21 NOTE — Op Note (Signed)
[place] Patient Name: Veronica Russell Procedure Date: 04/21/2020 9:36 AM MRN: [id number] Endoscopist: Jerene Bears , MD Age: 60 Referring MD:  Date of Birth: [birth year] Gender: Female Account #: [id number] Procedure:                Colonoscopy Indications:              High risk colon cancer surveillance: Personal                            history of adenoma with villous component, Family                            history of colon cancer in a first-degree relative                            (brother) before age 70 years, Last colonoscopy:                            2011 Medicines:                Monitored Anesthesia Care Procedure:                Pre-Anesthesia Assessment:                           - Prior to the procedure, a History and Physical                            was performed, and patient medications and                            allergies were reviewed. The patient's tolerance of                            previous anesthesia was also reviewed. The risks                            and benefits of the procedure and the sedation                            options and risks were discussed with the patient.                            All questions were answered, and informed consent                            was obtained. Prior Anticoagulants: The patient has                            taken no previous anticoagulant or antiplatelet                            agents. ASA Grade Assessment: III - A patient with  severe systemic disease. After reviewing the risks                            and benefits, the patient was deemed in                            satisfactory condition to undergo the procedure.                           After obtaining informed consent, the colonoscope                            was passed under direct vision. Throughout the                            procedure, the patient's blood pressure, pulse, and                             oxygen saturations were monitored continuously. The                            Olympus CF-[id number]) Colonoscope was                            introduced through the anus and advanced to the                            cecum, identified by appendiceal orifice and                            ileocecal valve. The colonoscopy was performed                            without difficulty. The patient tolerated the                            procedure well. The quality of the bowel                            preparation was good. The ileocecal valve,                            appendiceal orifice, and rectum were photographed. Scope In: 9:46:27 AM Scope Out: 10:07:14 AM Scope Withdrawal Time: 0 hours 18 minutes 21 seconds  Total Procedure Duration: 0 hours 20 minutes 47 seconds  Findings:                 The digital rectal exam was normal.                           A 5 mm polyp was found in the cecum. The polyp was                            sessile. The polyp was removed with a cold snare.  Resection and retrieval were complete.                           Four sessile polyps were found in the ascending                            colon. The polyps were 3 to 9 mm in size. These                            polyps were removed with a cold snare. Resection                            and retrieval were complete.                           A 4 mm polyp was found in the recto-sigmoid colon.                            The polyp was sessile. The polyp was removed with a                            cold snare. Resection and retrieval were complete.                           Multiple small and large-mouthed diverticula were                            found from ascending colon to sigmoid colon.                           The retroflexed view of the distal rectum and anal                            verge was normal and showed no anal or rectal                             abnormalities. Complications:            No immediate complications. Estimated Blood Loss:     Estimated blood loss was minimal. Impression:               - One 5 mm polyp in the cecum, removed with a cold                            snare. Resected and retrieved.                           - Four 3 to 9 mm polyps in the ascending colon,                            removed with a cold snare. Resected and retrieved.                           -  One 4 mm polyp at the recto-sigmoid colon,                            removed with a cold snare. Resected and retrieved.                           - Severe diverticulosis from ascending colon to                            sigmoid colon.                           - The distal rectum and anal verge are normal on                            retroflexion view. Recommendation:           - Patient has a contact number available for                            emergencies. The signs and symptoms of potential                            delayed complications were discussed with the                            patient. Return to normal activities tomorrow.                            Written discharge instructions were provided to the                            patient.                           - Resume previous diet.                           - Continue present medications.                           - Await pathology results.                           - Repeat colonoscopy is recommended for                            surveillance. The colonoscopy date will be                            determined after pathology results from today's                            exam become available for review. Jerene Bears, MD 04/21/2020 10:11:13 AM This report has been signed electronically.

## 2020-04-21 NOTE — Progress Notes (Signed)
Pt's states no medical or surgical changes since previsit or office visit. 

## 2020-04-21 NOTE — Progress Notes (Signed)
pt tolerated well. VSS. awake and to recovery. Report given to RN.  

## 2020-04-23 ENCOUNTER — Telehealth: Payer: Self-pay | Admitting: *Deleted

## 2020-04-23 NOTE — Telephone Encounter (Signed)
  Follow up Call-  Call back number 04/21/2020  Post procedure Call Back phone  # [phone]  Permission to leave phone message Yes  Some recent data might be hidden     Patient questions:  Do you have a fever, pain , or abdominal swelling? No. Pain Score  0 *  Have you tolerated food without any problems? Yes.    Have you been able to return to your normal activities? Yes.    Do you have any questions about your discharge instructions: Diet   No. Medications  No. Follow up visit  No.  Do you have questions or concerns about your Care? No.  Actions: * If pain score is 4 or above: No action needed, pain <4.  1. Have you developed a fever since your procedure? no  2.   Have you had an respiratory symptoms (SOB or cough) since your procedure? no  3.   Have you tested positive for COVID 19 since your procedure no  4.   Have you had any family members/close contacts diagnosed with the COVID 19 since your procedure?  no   If yes to any of these questions please route to Joylene John, RN and Joella Prince, RN

## 2020-04-23 NOTE — Telephone Encounter (Signed)
  Follow up Call-  Call back number 04/21/2020  Post procedure Call Back phone  # [phone]  Permission to leave phone message Yes  Some recent data might be hidden    [facility]

## 2020-04-28 ENCOUNTER — Encounter: Payer: Self-pay | Admitting: Internal Medicine

## 2020-04-28 MED FILL — PROPRANOLOL 20 MG TABLET: 20 | 90 days supply | Qty: 90 | Fill #1

## 2020-04-28 MED FILL — POTASSIUM CHLORIDE CRYS ER: 20 | 90 days supply | Qty: 90 | Fill #2

## 2020-04-28 MED FILL — ATORVASTATIN CALCIUM 20 MG: 20 | 90 days supply | Qty: 90 | Fill #1

## 2020-04-28 MED FILL — glipiZIDE 5 MG TABS: 5 | 90 days supply | Qty: 180 | Fill #1

## 2020-04-28 MED FILL — FREESTYLE LITE TEST STRIP: 50 days supply | Qty: 100 | Fill #1

## 2020-04-30 MED FILL — LOSARTAN POTASSIUM 50 MG TA: 50 | 30 days supply | Qty: 30 | Fill #3

## 2020-05-14 ENCOUNTER — Other Ambulatory Visit (HOSPITAL_COMMUNITY): Payer: Self-pay

## 2020-05-14 ENCOUNTER — Telehealth: Payer: 59 | Admitting: Physician Assistant

## 2020-05-14 ENCOUNTER — Other Ambulatory Visit: Payer: Self-pay | Admitting: Internal Medicine

## 2020-05-14 DIAGNOSIS — T7840XA Allergy, unspecified, initial encounter: Secondary | ICD-10-CM

## 2020-05-14 MED ORDER — FLUTICASONE PROPIONATE 50 MCG/ACT NA SUSP
2.0000 | Freq: Every day | NASAL | 0 refills | Status: DC
  Filled 2020-05-14: qty 16, 30d supply, fill #0

## 2020-05-14 MED ORDER — OZEMPIC (1 MG/DOSE) 4 MG/3ML ~~LOC~~ SOPN
1.0000 mg | PEN_INJECTOR | SUBCUTANEOUS | 0 refills | Status: DC
  Filled 2020-05-14: qty 3, 28d supply, fill #0

## 2020-05-14 NOTE — Progress Notes (Signed)
E visit for Allergic Rhinitis We are sorry that you are not feeling well.  Here is how we plan to help!  Based on what you have shared with me it looks like you have Allergic Rhinitis.  Rhinitis is when a reaction occurs that causes nasal congestion, runny nose, sneezing, and itching.  Most types of rhinitis are caused by an inflammation and are associated with symptoms in the eyes ears or throat. There are several types of rhinitis.  The most common are acute rhinitis, which is usually caused by a viral illness, allergic or seasonal rhinitis, and nonallergic or year-round rhinitis.  Nasal allergies occur certain times of the year.  Allergic rhinitis is caused when allergens in the air trigger the release of histamine in the body.  Histamine causes itching, swelling, and fluid to build up in the fragile linings of the nasal passages, sinuses and eyelids.  An itchy nose and clear discharge are common.  I recommend the following over the counter treatments: You should take a daily dose of antihistamine such as Claritin or Zyrtec  I also would recommend a nasal spray: Flonase 2 sprays into each nostril once daily  HOME CARE:   You can use an over-the-counter saline nasal spray as needed  Avoid areas where there is heavy dust, mites, or molds  Stay indoors on windy days during the pollen season  Keep windows closed in home, at least in bedroom; use air conditioner.  Use high-efficiency house air filter  Keep windows closed in car, turn AC on re-circulate  Avoid playing out with dog during pollen season  GET HELP RIGHT AWAY IF:   If your symptoms do not improve within 10 days  You become short of breath  You develop yellow or green discharge from your nose for over 3 days  You have coughing fits  MAKE SURE YOU:   Understand these instructions  Will watch your condition  Will get help right away if you are not doing well or get worse  Thank you for choosing an  e-visit. Your e-visit answers were reviewed by a board certified advanced clinical practitioner to complete your personal care plan. Depending upon the condition, your plan could have included both over the counter or prescription medications. Please review your pharmacy choice. Be sure that the pharmacy you have chosen is open so that you can pick up your prescription now.  If there is a problem you may message your provider in [place] to have the prescription routed to another pharmacy. Your safety is important to [name]. If you have drug allergies check your prescription carefully.  For the next 24 hours, you can use MyChart to ask questions about today's visit, request a non-urgent call back, or ask for a work or school excuse from your e-visit provider. You will get an email in the next two days asking about your experience. I hope that your e-visit has been valuable and will speed your recovery.  Approximately 5 minutes was spent documenting and reviewing patient's chart.

## 2020-05-19 ENCOUNTER — Other Ambulatory Visit (HOSPITAL_COMMUNITY): Payer: Self-pay

## 2020-05-19 DIAGNOSIS — R0981 Nasal congestion: Secondary | ICD-10-CM | POA: Diagnosis not present

## 2020-05-19 DIAGNOSIS — J208 Acute bronchitis due to other specified organisms: Secondary | ICD-10-CM | POA: Diagnosis not present

## 2020-05-19 MED ORDER — BENZONATATE 200 MG PO CAPS
200.0000 mg | ORAL_CAPSULE | Freq: Three times a day (TID) | ORAL | 0 refills | Status: DC
  Filled 2020-05-19: qty 30, 10d supply, fill #0

## 2020-05-19 MED FILL — Omeprazole Cap Delayed Release 20 MG: ORAL | 90 days supply | Qty: 90 | Fill #0 | Status: AC

## 2020-05-22 ENCOUNTER — Other Ambulatory Visit: Payer: Self-pay

## 2020-05-22 ENCOUNTER — Ambulatory Visit: Payer: 59 | Admitting: Internal Medicine

## 2020-05-22 ENCOUNTER — Other Ambulatory Visit (HOSPITAL_COMMUNITY): Payer: Self-pay

## 2020-05-22 ENCOUNTER — Encounter: Payer: Self-pay | Admitting: Internal Medicine

## 2020-05-22 VITALS — BP 120/82 | HR 97 | Ht 64.0 in | Wt 252.4 lb

## 2020-05-22 DIAGNOSIS — E042 Nontoxic multinodular goiter: Secondary | ICD-10-CM

## 2020-05-22 DIAGNOSIS — E1165 Type 2 diabetes mellitus with hyperglycemia: Secondary | ICD-10-CM | POA: Diagnosis not present

## 2020-05-22 DIAGNOSIS — E0590 Thyrotoxicosis, unspecified without thyrotoxic crisis or storm: Secondary | ICD-10-CM | POA: Diagnosis not present

## 2020-05-22 LAB — POCT GLYCOSYLATED HEMOGLOBIN (HGB A1C): Hemoglobin A1C: 7.1 % — AB (ref 4.0–5.6)

## 2020-05-22 LAB — TSH: TSH: 0.52 u[IU]/mL (ref 0.35–4.50)

## 2020-05-22 LAB — T3, FREE: T3, Free: 3.7 pg/mL (ref 2.3–4.2)

## 2020-05-22 LAB — T4, FREE: Free T4: 0.83 ng/dL (ref 0.60–1.60)

## 2020-05-22 MED ORDER — OZEMPIC (1 MG/DOSE) 4 MG/3ML ~~LOC~~ SOPN
1.0000 mg | PEN_INJECTOR | SUBCUTANEOUS | 3 refills | Status: DC
  Filled 2020-05-22 – 2020-06-22 (×2): qty 9, 84d supply, fill #0
  Filled 2020-09-09: qty 3, 28d supply, fill #1
  Filled 2020-10-16: qty 3, 28d supply, fill #2
  Filled 2020-11-21: qty 9, 84d supply, fill #3
  Filled 2021-02-13: qty 9, 84d supply, fill #4
  Filled 2021-05-10: qty 3, 28d supply, fill #5

## 2020-05-22 NOTE — Patient Instructions (Addendum)
Please continue: - Metformin ER 1000 mg with dinner - Glipizide 5 mg-10 mg before a late or large dinner - Ozempic 1 mg weekly  Please stop popsicles.  Please return in 4 months with your sugar log.

## 2020-05-22 NOTE — Progress Notes (Signed)
Patient ID: Veronica Russell, female   DOB: 1960-10-09, 60 y.o.   MRN: [id number]    This visit occurred during the SARS-CoV-2 public health emergency.  Safety protocols were in place, including screening questions prior to the visit, additional usage of staff PPE, and extensive cleaning of exam room while observing appropriate contact time as indicated for disinfecting solutions.   HPI  Veronica Russell is a 60 y.o.-year-old female, returning for follow-up for h/o subclinical hyperthyroidism, right thyroid nodule, and DM2, non-insulin-dependent.  Last visit 4 months ago.  Interim history: She recently had a colonoscopy >> had 5 polyps >> will have a repeat in 3 years. Brother had colon cancer. She moved in with her mother >> very stressful >> will move by herself soon. Also, high stress at work (in ED).  Subclinical hyperthyroidism:  Patient was found to have a slightly low TSH at her first visit in the weight loss clinic in 01/2016. Her TFTs normalized after 2018.  Reviewed her TFTs: Lab Results  Component Value Date   TSH 0.57 09/27/2019   TSH 0.63 09/20/2018   TSH 0.50 10/05/2017   TSH 0.52 04/04/2017   TSH 0.436 (L) 09/29/2016   TSH 0.443 (L) 05/09/2016   TSH 0.425 (L) 01/27/2016   FREET4 0.94 09/27/2019   FREET4 0.80 09/20/2018   FREET4 0.75 10/05/2017   FREET4 0.75 04/04/2017   FREET4 1.17 09/29/2016   FREET4 1.24 05/09/2016   FREET4 1.17 01/27/2016    Lab Results  Component Value Date   T3FREE 4.2 09/27/2019   T3FREE 3.4 09/20/2018   T3FREE 3.9 10/05/2017   T3FREE 3.7 04/04/2017    She has chronic: Hot flashes Palpitations with exertion Hair loss Hereditary tremors  Pt does have a FH of thyroid ds. - M aunt. No FH of thyroid cancer. No h/o radiation tx to head or neck.  No seaweed or kelp. No recent contrast studies. No herbal supplements. No Biotin use. No recent steroids use.   Right thyroid nodule:  Reviewed previous investigation: 04/14/2017: Thyroid  ultrasound: 2.9 x 2 x 2.7 solid, hypoechoic, right thyroid nodule 05/11/2017: FNA of the dominant thyroid nodule: Benign 06/05/2019: Thyroid ultrasound: Stable nodules  Pt denies: - feeling nodules in neck - hoarseness - dysphagia - choking - SOB with lying down  DM2:  Reviewed HbA1c levels: Lab Results  Component Value Date   HGBA1C 7.3 (A) 01/17/2020   HGBA1C 6.8 (A) 09/27/2019   HGBA1C 7.3 (A) 05/23/2019   HGBA1C 7.6 (H) 02/12/2019   HGBA1C 7.1 (A) 01/21/2019   HGBA1C 7.1 (A) 09/20/2018   HGBA1C 6.8 (H) 07/19/2018   HGBA1C 7.2 (H) 03/07/2018   HGBA1C 7.0 (A) 01/10/2018   HGBA1C 7.4 (H) 10/26/2017   She is on: - Metformin ER 1000 mg with dinner -  >> Ozempic 1 mg weekly - Glipizide 5 milligrams mg after dinner! >> Moved to before dinner -usually takes 5 mg only  She checks her sugars twice a day : - am:  113-172 >> 112-172, 185 >> 120-130 >> 118-175 - 2h after b'fast: n/c - lunch: n/c >> 139 >> n/c - 2h after lunch: n/c - dinner: 103-163, 187 >> 110-152 >> 104-112 >> 94 - 2h after dinner:  110-181, 209 >> 150-165 >> 134-225, 250 - bedtime: n/c Lowest: 101 >> 110 >> 104 >> 94; hypoglycemia awareness in the 70s. Highest: 390 (40 mg Prednisone) >> ... 233 >> 209 >> 215 (late dinner) >> 250 (popsicles).  Meter: Accu-Chek  No CKD:  12/16/2019: Glucose 122, BUN/creatinine 14/0.74, GFR 97 Lab Results  Component Value Date   BUN 11 06/10/2019   Lab Results  Component Value Date   CREATININE 0.75 06/10/2019  On losartan.  + HL: 12/16/2019:176/141/50/101 8 Lab Results  Component Value Date   CHOL 173 06/10/2019   HDL 53 06/10/2019   LDLCALC 92 06/10/2019   TRIG 161 (H) 06/10/2019  On Lipitor 20.  Last eye exam: 08/31//2021: No DR, but has abnormal intraocular pressure (remeasured IO pressure in 03/2019).   She also has a history of HTN, asthma, vitamin D deficiency.  She also has hair loss and sees Dr. Lois Huxley, dermatologist at [facility]  She  specializes in hair loss.  ROS: Constitutional: no weight gain/no weight loss, no fatigue, + subjective hyperthermia, no subjective hypothermia Eyes: no blurry vision, no xerophthalmia ENT: no sore throat, + see HPI Cardiovascular: no CP/no SOB/no palpitations/no leg swelling Respiratory: no cough/no SOB/no wheezing Gastrointestinal: no N/no V/no D/no C/no acid reflux Musculoskeletal: no muscle aches/no joint aches Skin: no rashes, + hair loss Neurological:+ tremors/no numbness/no tingling/no dizziness  I reviewed pt's medications, allergies, PMH, social hx, family hx, and changes were documented in the history of present illness. Otherwise, unchanged from my initial visit note.  Past Medical History:  Diagnosis Date  . Allergy   . Alopecia   . Asthma   . Back pain   . Blood transfusion without reported diagnosis    with hysterectomy   . Diabetes ([place])   . Diabetes mellitus ([place])   . GERD (gastroesophageal reflux disease)   . 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 Hyperlipidemia   . Hypertension   . Joint pain   . Lactose intolerance   . Obesity   . Raynauds syndrome   . Swelling   . Tremor   . Vitamin D deficiency    Past Surgical History:  Procedure Laterality Date  . ABDOMINAL HYSTERECTOMY    . BREAST BIOPSY    . COLONOSCOPY    . MYOMECTOMY    . POLYPECTOMY     Social History   Socioeconomic History  . Marital status: Single    Spouse name: Not on file  . Number of children: no  Social Needs  Occupational History  . Occupation: [employer]    Employer: [employer]  Tobacco Use  . Smoking status: Never Smoker  . Smokeless tobacco: Never Used  Substance and Sexual Activity  . Alcohol use:  Wine, 2-3 times a year, 1 drink  . Drug use: No   Current Outpatient Medications on File Prior to Visit  Medication Sig Dispense Refill  . albuterol (VENTOLIN HFA) 108 (90 Base) MCG/ACT inhaler Inhale 2 puffs into the lungs every 6  (six) hours as needed for wheezing or shortness of breath. 1 Inhaler 0  . Alcohol Swabs (ALCOHOL WIPES) 70 % PADS 30 Packages by Does not apply route 2 (two) times daily. (Patient not taking: Reported on 04/07/2020) 100 each 0  . amLODipine (NORVASC) 5 MG tablet Take 5 mg by mouth daily.    Marland Kitchen amLODipine (NORVASC) 5 MG tablet TAKE 1 TABLET BY MOUTH ONCE DAILY IN THE MORNING. 90 tablet 1  . atorvastatin (LIPITOR) 20 MG tablet Take 20 mg by mouth daily.    Marland Kitchen atorvastatin (LIPITOR) 20 MG tablet TAKE 1 TABLET BY MOUTH ONCE DAILY 90 tablet 1  . atorvastatin (LIPITOR) 20 MG tablet TAKE 1 TABLET BY  MOUTH ONCE DAILY 90 tablet 1  . benzonatate (TESSALON) 200 MG capsule Take 1 capsule by mouth 3 times a day as  needed for cough 30 capsule 0  . Blood Glucose Monitoring Suppl (FREESTYLE FREEDOM LITE) w/Device KIT Use to check blood sugars (Patient not taking: Reported on 04/07/2020) 1 kit 0  . Cholecalciferol (VITAMIN D-3) 5000 UNIT/ML LIQD 1 tablet    . Cholecalciferol 4000 units CAPS Take 4,000 Units by mouth daily.  (Patient not taking: Reported on 04/07/2020)    . fluticasone (FLONASE) 50 MCG/ACT nasal spray Place 2 sprays into both nostrils daily. 16 g 0  . glipiZIDE (GLUCOTROL) 5 MG tablet TAKE 1-2 TABLETS (5-10 MG TOTAL) BY MOUTH DAILY BEFORE SUPPER. 180 tablet 3  . glucose blood test strip USE TO CHECK BLOOD SUGAR 2 TIMES A DAY. (Patient not taking: Reported on 04/07/2020) 100 strip 12  . hydrochlorothiazide (HYDRODIURIL) 25 MG tablet Take 25 mg by mouth every morning.  1  . hydrochlorothiazide (HYDRODIURIL) 25 MG tablet TAKE 1 TABLET BY MOUTH ONCE DAILY IN THE MORNING. 90 tablet 1  . Lancets (FREESTYLE) lancets Use to check blood sugar 2 times a day. (Patient not taking: Reported on 04/07/2020) 200 each 12  . losartan (COZAAR) 50 MG tablet Take 50 mg by mouth daily.     Marland Kitchen losartan (COZAAR) 50 MG tablet TAKE 1 TABLET BY MOUTH DAILY 90 tablet 1  . Melatonin 3 MG TABS Take 1 tablet by mouth at bedtime as needed.     . metFORMIN (GLUCOPHAGE-XR) 500 MG 24 hr tablet TAKE 2 TABLETS (1,000 MG TOTAL) BY MOUTH AT BEDTIME. 180 tablet 3  . montelukast (SINGULAIR) 10 MG tablet Take 10 mg by mouth at bedtime.    . montelukast (SINGULAIR) 10 MG tablet TAKE 1 TABLET BY MOUTH EVERY EVENING 90 tablet 1  . omeprazole (PRILOSEC) 20 MG capsule Take 20 mg by mouth every morning.    Marland Kitchen omeprazole (PRILOSEC) 20 MG capsule TAKE 1 CAPSULE BY MOUTH ONCE DAILY 30 MINUTES BEFORE MORNING MEAL. 90 capsule 1  . omeprazole (PRILOSEC) 20 MG capsule TAKE 1 CAPSULE BY MOUTH ONCE DAILY 30 MINUTES BEFORE MORNING MEAL. 90 capsule 1  . Potassium Chloride ER 20 MEQ TBCR 1 tablet with food    . potassium chloride SA (KLOR-CON) 20 MEQ tablet TAKE 1 TABLET BY MOUTH ONCE DAILY WITH FOOD 90 tablet 2  . propranolol (INDERAL) 20 MG tablet Take 20 mg by mouth daily.     . propranolol (INDERAL) 20 MG tablet TAKE 1 TABLET BY MOUTH ONCE DAILY 90 tablet 1  . propranolol (INDERAL) 20 MG tablet TAKE 1 TABLET BY MOUTH ONCE DAILY 90 tablet 1  . Semaglutide, 1 MG/DOSE, (OZEMPIC, 1 MG/DOSE,) 4 MG/3ML SOPN Inject 1 mg into the skin once a week. 3 mL 0   No current facility-administered medications on file prior to visit.   Allergies  Allergen Reactions  . Erythromycin Nausea Only  . Milk-Related Compounds Other (See Comments)    Lactose intollerance  . Tetanus-Diphth-Acell Pertussis Hives   Family History  Problem Relation Age of Onset  . Multiple myeloma Mother   . Hypertension Mother   . Hyperlipidemia Mother   . Cancer Mother        multiple Myeloma in remission   . Obesity Mother   . Hypertension Father   . Hyperlipidemia Father   . Cancer Father   . Prostate cancer Father   . Asthma Other   . Hyperlipidemia Other   .  Hypertension Other   . Cancer Other   . COPD Other   . Stroke Other   . Breast cancer Maternal Aunt   . Breast cancer Paternal Aunt   . Breast cancer Maternal Aunt   . Colon cancer Brother 49  . Colon polyps Brother   .  Esophageal cancer Neg Hx   . Rectal cancer Neg Hx   . Stomach cancer Neg Hx     PE: LMP 08/31/2008  Wt Readings from Last 3 Encounters:  04/21/20 255 lb (115.7 kg)  04/07/20 255 lb (115.7 kg)  01/17/20 265 lb (120.2 kg)   Constitutional: overweight, in NAD Eyes: PERRLA, EOMI, no exophthalmos ENT: moist mucous membranes, no thyromegaly, no cervical lymphadenopathy Cardiovascular: RRR, No MRG Respiratory: CTA B Gastrointestinal: abdomen soft, NT, ND, BS+ Musculoskeletal: no deformities, strength intact in all 4 Skin: moist, warm, no rashes Neurological: no tremor with outstretched hands, DTR normal in all 4  ASSESSMENT: 1.  Mild subclinical thyrotoxicosis  2.  Multiple thyroid nodules Thyroid U/S (04/14/2017): FINDINGS: Parenchymal Echotexture: Mildly heterogenous Isthmus: 0.5 cm Right lobe: 4.5 x 2.1 x 1.3 cm Left lobe: 4.3 x 1.4 x 1.6 cm _________________________________________________________  Nodule # 1: Location: Right; Inferior Maximum size: 2.9 cm; Other 2 dimensions: 2.0 x 2.1 cm Composition: solid/almost completely solid (2) Echogenicity: hypoechoic (2) Shape: not taller-than-wide (0) Margins: smooth (0) Echogenic foci: none (0) ACR TI-RADS total points: 4. ACR TI-RADS risk category: TR4 (4-6 points).  ACR TI-RADS recommendations: **Given size (>/= 1.5 cm) and appearance, fine needle aspiration of this moderately suspicious nodule should be considered based on TI-RADS criteria.________________________________________________  Multiple other smaller nodules measure 0.7 cm or less in size and do not meet criteria for biopsy nor follow-up.  IMPRESSION: Right lower pole nodule 1 meets criteria for fine needle aspiration biopsy.   FNA: Adequacy Reason Satisfactory For Evaluation. Diagnosis THYROID, FINE NEEDLE ASPIRATION, RLP (SPECIMEN 1 OF 1,COLLECTED 05/11/17): CONSISTENT WITH BENIGN FOLLICULAR NODULE ([place] CATEGORY II). Enid Cutter  MD Pathologist, Electronic Signature (Case signed 05/15/2017) Specimen Clinical Information Right Inferior, 2.9cm; Other 2 dimensions: 2.0 x 2.1cm, solid/almost completely solid, hypoechoic, TI-RADS total points - 4, Moderately suspicious nodule Source Thyroid, Fine Needle Aspiration, Right, RLP (Specimen 1 of 1, collected on 05/11/17)  Thyroid U/S (06/05/2019): CLINICAL DATA:  Goiter. 60 year old female with a history of thyroid nodules. The 2.9 cm right inferior thyroid nodule was previously biopsied on 05/11/2017  Parenchymal Echotexture: Mildly heterogenous Isthmus: 0.4 cm Right lobe: 4.9 x 2.2 x 1.5 cm Left lobe: 4.0 x 1.6 x 1.3 cm _________________________________________________________  The previously biopsied nodule in the right inferior gland is unchanged at 2.9 x 2.2 x 2.1 cm. Additional small subcentimeter thyroid nodules again noted scattered throughout the left gland. None of these nodules demonstrates significant interval growth or change in morphology. These lesions do not meet criteria for biopsy or dedicated imaging surveillance.  IMPRESSION: No significant interval change in the size or appearance of the previously biopsied 2.9 cm nodule in the right inferior gland.  No new or suspicious thyroid nodules identified.  3. DM2, non-insulin-dependent  PLAN:  1. Patient with history of a slightly low TSH with normal free thyroid hormones (subclinical hypothyroidism), with possible thyrotoxic symptoms: Hot flashes (however, she is also postmenopausal), palpitations with exertion, hair loss.  These are all chronic. -TFTs were normal at last check: Lab Results  Component Value Date   TSH 0.57 09/27/2019  -We will repeat her TFTs today  2.  Multiple thyroid nodules -  No neck compression symptoms -She has a solid, right, 2.9 cm nodule on the ultrasound from 03/2017.  Biopsy of this nodule in 05/2017 was benign.  We repeated a thyroid ultrasound in 06/2019 and  the nodule was stable.  She also has several small nodules, not worrisome. -We will repeat another thyroid ultrasound in another year.  If stable nodules at that time, this would conclude 5 years of follow-up and no further follow-up would be needed  3. DM2 -Patient with uncontrolled type 2 diabetes, with improved control before last visit after switching from Victoza to [place].  However, at last visit, an HbA1c was 7.3%, higher.  Per review of her glucose levels at home, the estimated HbA1c was 6.5%.  I advised her to continue the same diabetic regimen and if HbA1c would still be higher than expected at the present visit, to add a fructosamine level. -At today's visit, sugars are higher than before and she mentions that this is most likely due to the fact that she is eating many popsicles.  As a consequence, sugars are usually higher at night (up to 250!)  And they have better in the morning but still above target.  She is aware that she needs to stop eating the popsicles.  I strongly encouraged her to do so.  For now, I did not advise him to change the regimen but I did advise her that she occasionally may need to take 10 mg of glipizide before dinner.  We also discussed about possibly increasing the Ozempic dose to the newly FDA approved dose of 2 mg weekly, but then will only be out in May, so for now, we will continue with the current dose. - I suggested to: Patient Instructions  Please continue: - Metformin ER 1000 mg with dinner - Glipizide 5 mg-10 mg before a late or large dinner - Ozempic 1 mg weekly  Please stop popsicles.  Please return in 4 months with your sugar log.   - we checked her HbA1c: 7.1% (better) - advised to check sugars at different times of the day - 1-2x a day, rotating check times - advised for yearly eye exams >> she is UTD - return to clinic in 4 months  Component     Latest Ref Rng & Units 05/22/2020  T4,Free(Direct)     0.60 - 1.60 ng/dL 0.83  TSH     0.35 -  4.50 uIU/mL 0.52  Triiodothyronine,Free,Serum     2.3 - 4.2 pg/mL 3.7  TFTs are normal.  Philemon Kingdom, MD PhD [facility]

## 2020-06-09 ENCOUNTER — Other Ambulatory Visit (HOSPITAL_COMMUNITY): Payer: Self-pay

## 2020-06-09 MED FILL — Metformin HCl Tab ER 24HR 500 MG: ORAL | 90 days supply | Qty: 180 | Fill #0 | Status: AC

## 2020-06-10 ENCOUNTER — Other Ambulatory Visit (HOSPITAL_COMMUNITY): Payer: Self-pay

## 2020-06-10 MED ORDER — MONTELUKAST SODIUM 10 MG PO TABS
10.0000 mg | ORAL_TABLET | Freq: Every evening | ORAL | 0 refills | Status: DC
  Filled 2020-06-10: qty 90, 90d supply, fill #0

## 2020-06-10 MED ORDER — HYDROCHLOROTHIAZIDE 25 MG PO TABS
25.0000 mg | ORAL_TABLET | Freq: Every morning | ORAL | 0 refills | Status: DC
  Filled 2020-06-10: qty 90, 90d supply, fill #0

## 2020-06-10 MED ORDER — AMLODIPINE BESYLATE 5 MG PO TABS
5.0000 mg | ORAL_TABLET | Freq: Every morning | ORAL | 0 refills | Status: DC
  Filled 2020-06-10: qty 90, 90d supply, fill #0

## 2020-06-12 ENCOUNTER — Other Ambulatory Visit (HOSPITAL_COMMUNITY): Payer: Self-pay

## 2020-06-12 MED FILL — Glucose Blood Test Strip: 50 days supply | Qty: 100 | Fill #0 | Status: AC

## 2020-06-13 ENCOUNTER — Other Ambulatory Visit (HOSPITAL_COMMUNITY): Payer: Self-pay

## 2020-06-15 ENCOUNTER — Other Ambulatory Visit (HOSPITAL_COMMUNITY): Payer: Self-pay

## 2020-06-15 MED ORDER — LOSARTAN POTASSIUM 50 MG PO TABS
50.0000 mg | ORAL_TABLET | Freq: Every day | ORAL | 1 refills | Status: DC
  Filled 2020-06-15: qty 90, 90d supply, fill #0
  Filled 2020-09-09: qty 90, 90d supply, fill #1

## 2020-06-16 DIAGNOSIS — E7800 Pure hypercholesterolemia, unspecified: Secondary | ICD-10-CM | POA: Diagnosis not present

## 2020-06-16 DIAGNOSIS — E041 Nontoxic single thyroid nodule: Secondary | ICD-10-CM | POA: Diagnosis not present

## 2020-06-16 DIAGNOSIS — E1169 Type 2 diabetes mellitus with other specified complication: Secondary | ICD-10-CM | POA: Diagnosis not present

## 2020-06-16 DIAGNOSIS — I10 Essential (primary) hypertension: Secondary | ICD-10-CM | POA: Diagnosis not present

## 2020-06-18 ENCOUNTER — Other Ambulatory Visit (HOSPITAL_COMMUNITY): Payer: Self-pay

## 2020-06-18 DIAGNOSIS — Z6841 Body Mass Index (BMI) 40.0 and over, adult: Secondary | ICD-10-CM | POA: Diagnosis not present

## 2020-06-18 DIAGNOSIS — J3489 Other specified disorders of nose and nasal sinuses: Secondary | ICD-10-CM | POA: Diagnosis not present

## 2020-06-18 DIAGNOSIS — R059 Cough, unspecified: Secondary | ICD-10-CM | POA: Diagnosis not present

## 2020-06-18 MED ORDER — ALBUTEROL SULFATE HFA 108 (90 BASE) MCG/ACT IN AERS
1.0000 | INHALATION_SPRAY | RESPIRATORY_TRACT | 0 refills | Status: DC
  Filled 2020-06-18: qty 18, 25d supply, fill #0

## 2020-06-18 MED ORDER — BENZONATATE 100 MG PO CAPS
100.0000 mg | ORAL_CAPSULE | Freq: Three times a day (TID) | ORAL | 0 refills | Status: AC | PRN
  Filled 2020-06-18: qty 30, 10d supply, fill #0

## 2020-06-22 ENCOUNTER — Other Ambulatory Visit (HOSPITAL_COMMUNITY): Payer: Self-pay

## 2020-06-22 MED ORDER — AZITHROMYCIN 250 MG PO TABS
ORAL_TABLET | ORAL | 0 refills | Status: AC
  Filled 2020-06-22: qty 6, 5d supply, fill #0

## 2020-08-06 ENCOUNTER — Other Ambulatory Visit (HOSPITAL_COMMUNITY): Payer: Self-pay

## 2020-08-06 MED FILL — Glucose Blood Test Strip: 50 days supply | Qty: 100 | Fill #1 | Status: AC

## 2020-08-07 ENCOUNTER — Other Ambulatory Visit (HOSPITAL_COMMUNITY): Payer: Self-pay

## 2020-08-07 MED ORDER — PROPRANOLOL HCL 20 MG PO TABS
20.0000 mg | ORAL_TABLET | Freq: Every day | ORAL | 1 refills | Status: DC
  Filled 2020-08-07: qty 90, 90d supply, fill #0
  Filled 2020-12-09: qty 90, 90d supply, fill #1

## 2020-08-07 MED ORDER — ATORVASTATIN CALCIUM 20 MG PO TABS
20.0000 mg | ORAL_TABLET | Freq: Every day | ORAL | 1 refills | Status: DC
  Filled 2020-08-07: qty 90, 90d supply, fill #0
  Filled 2020-12-09: qty 90, 90d supply, fill #1

## 2020-08-10 ENCOUNTER — Other Ambulatory Visit (HOSPITAL_COMMUNITY): Payer: Self-pay

## 2020-08-21 DIAGNOSIS — S46811A Strain of other muscles, fascia and tendons at shoulder and upper arm level, right arm, initial encounter: Secondary | ICD-10-CM | POA: Diagnosis not present

## 2020-08-24 ENCOUNTER — Other Ambulatory Visit (HOSPITAL_COMMUNITY): Payer: Self-pay

## 2020-08-24 MED ORDER — DICLOFENAC POTASSIUM 50 MG PO TABS
50.0000 mg | ORAL_TABLET | Freq: Three times a day (TID) | ORAL | 0 refills | Status: AC
  Filled 2020-08-24 (×2): qty 15, 5d supply, fill #0

## 2020-08-24 MED ORDER — METHOCARBAMOL 750 MG PO TABS
750.0000 mg | ORAL_TABLET | Freq: Three times a day (TID) | ORAL | 0 refills | Status: AC
  Filled 2020-08-24: qty 15, 5d supply, fill #0

## 2020-08-25 ENCOUNTER — Other Ambulatory Visit (HOSPITAL_COMMUNITY): Payer: Self-pay

## 2020-09-01 ENCOUNTER — Other Ambulatory Visit: Payer: Self-pay | Admitting: Family Medicine

## 2020-09-01 DIAGNOSIS — Z1231 Encounter for screening mammogram for malignant neoplasm of breast: Secondary | ICD-10-CM

## 2020-09-09 ENCOUNTER — Other Ambulatory Visit (HOSPITAL_COMMUNITY): Payer: Self-pay

## 2020-09-09 MED ORDER — MONTELUKAST SODIUM 10 MG PO TABS
10.0000 mg | ORAL_TABLET | Freq: Every evening | ORAL | 1 refills | Status: DC
  Filled 2020-09-09: qty 90, 90d supply, fill #0
  Filled 2020-12-09: qty 90, 90d supply, fill #1

## 2020-09-09 MED ORDER — AMLODIPINE BESYLATE 5 MG PO TABS
5.0000 mg | ORAL_TABLET | Freq: Every morning | ORAL | 1 refills | Status: DC
  Filled 2020-09-09: qty 90, 90d supply, fill #0
  Filled 2020-12-09: qty 90, 90d supply, fill #1

## 2020-09-09 MED ORDER — HYDROCHLOROTHIAZIDE 25 MG PO TABS
25.0000 mg | ORAL_TABLET | Freq: Every morning | ORAL | 1 refills | Status: DC
  Filled 2020-09-09: qty 90, 90d supply, fill #0
  Filled 2020-12-09: qty 90, 90d supply, fill #1

## 2020-09-09 MED FILL — Metformin HCl Tab ER 24HR 500 MG: ORAL | 90 days supply | Qty: 180 | Fill #1 | Status: AC

## 2020-09-15 ENCOUNTER — Other Ambulatory Visit (HOSPITAL_COMMUNITY): Payer: Self-pay

## 2020-09-15 MED ORDER — OMEPRAZOLE 20 MG PO CPDR
20.0000 mg | DELAYED_RELEASE_CAPSULE | Freq: Every day | ORAL | 0 refills | Status: DC
  Filled 2020-09-15: qty 66, 66d supply, fill #0
  Filled 2020-09-15: qty 24, 24d supply, fill #0

## 2020-09-25 ENCOUNTER — Ambulatory Visit: Payer: 59 | Admitting: Internal Medicine

## 2020-09-25 ENCOUNTER — Encounter: Payer: Self-pay | Admitting: Internal Medicine

## 2020-09-25 ENCOUNTER — Other Ambulatory Visit (HOSPITAL_COMMUNITY): Payer: Self-pay

## 2020-09-25 ENCOUNTER — Other Ambulatory Visit: Payer: Self-pay

## 2020-09-25 VITALS — BP 130/88 | HR 99 | Ht 64.0 in | Wt 252.8 lb

## 2020-09-25 DIAGNOSIS — E042 Nontoxic multinodular goiter: Secondary | ICD-10-CM | POA: Diagnosis not present

## 2020-09-25 DIAGNOSIS — E0590 Thyrotoxicosis, unspecified without thyrotoxic crisis or storm: Secondary | ICD-10-CM | POA: Diagnosis not present

## 2020-09-25 DIAGNOSIS — E1165 Type 2 diabetes mellitus with hyperglycemia: Secondary | ICD-10-CM

## 2020-09-25 LAB — POCT GLYCOSYLATED HEMOGLOBIN (HGB A1C): Hemoglobin A1C: 6.6 % — AB (ref 4.0–5.6)

## 2020-09-25 MED ORDER — GLIPIZIDE 5 MG PO TABS
5.0000 mg | ORAL_TABLET | Freq: Every day | ORAL | 3 refills | Status: DC
  Filled 2020-09-25: qty 180, 90d supply, fill #0
  Filled 2021-01-20: qty 180, 90d supply, fill #1
  Filled 2021-05-25: qty 180, 90d supply, fill #2

## 2020-09-25 MED ORDER — METFORMIN HCL ER 500 MG PO TB24
1000.0000 mg | ORAL_TABLET | Freq: Every day | ORAL | 3 refills | Status: DC
  Filled 2020-09-25: qty 180, fill #0
  Filled 2021-01-13: qty 180, 90d supply, fill #0

## 2020-09-25 NOTE — Progress Notes (Signed)
Patient ID: Veronica Russell, female   DOB: 1960/05/07, 60 y.o.   MRN: [id number]    This visit occurred during the SARS-CoV-2 public health emergency.  Safety protocols were in place, including screening questions prior to the visit, additional usage of staff PPE, and extensive cleaning of exam room while observing appropriate contact time as indicated for disinfecting solutions.   HPI  Veronica Russell is a 60 y.o.-year-old female, returning for follow-up for h/o subclinical hyperthyroidism, right thyroid nodule, and DM2, non-insulin-dependent.  Last visit 4 months ago.  Interim history: No increased urination, blurry vision, nausea, chest pain. 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 She moved since last OV >> feels much better. She had several parties recently and sugars have been slightly higher.   Subclinical hyperthyroidism:  Patient was found to have a slightly low TSH at her first visit in the weight loss clinic in 01/2016. Her TFTs normalized after 2018.  Reviewed her TFTs: Lab Results  Component Value Date   TSH 0.52 05/22/2020   TSH 0.57 09/27/2019   TSH 0.63 09/20/2018   TSH 0.50 10/05/2017   TSH 0.52 04/04/2017   TSH 0.436 (L) 09/29/2016   TSH 0.443 (L) 05/09/2016   TSH 0.425 (L) 01/27/2016   FREET4 0.83 05/22/2020   FREET4 0.94 09/27/2019   FREET4 0.80 09/20/2018   FREET4 0.75 10/05/2017   FREET4 0.75 04/04/2017   FREET4 1.17 09/29/2016   FREET4 1.24 05/09/2016   FREET4 1.17 01/27/2016    Lab Results  Component Value Date   T3FREE 3.7 05/22/2020   T3FREE 4.2 09/27/2019   T3FREE 3.4 09/20/2018   T3FREE 3.9 10/05/2017   T3FREE 3.7 04/04/2017    She has chronic: Hot flashes Palpitations with exertion Hair loss Hereditary tremors  Pt does have a FH of thyroid ds. - M aunt. No FH of thyroid cancer. No h/o radiation tx to head or neck.  No herbal supplements. No Biotin use. No recent steroids use.   Right thyroid  nodule:  Reviewed previous investigation: 04/14/2017: Thyroid ultrasound: 2.9 x 2 x 2.7 solid, hypoechoic, right thyroid nodule 05/11/2017: FNA of the dominant thyroid nodule: Benign 06/05/2019: Thyroid ultrasound: Stable nodules  Pt denies: - feeling nodules in neck - hoarseness - dysphagia - choking - SOB with lying down  DM2:  Reviewed HbA1c levels: Lab Results  Component Value Date   HGBA1C 7.1 (A) 05/22/2020   HGBA1C 7.3 (A) 01/17/2020   HGBA1C 6.8 (A) 09/27/2019   HGBA1C 7.3 (A) 05/23/2019   HGBA1C 7.6 (H) 02/12/2019   HGBA1C 7.1 (A) 01/21/2019   HGBA1C 7.1 (A) 09/20/2018   HGBA1C 6.8 (H) 07/19/2018   HGBA1C 7.2 (H) 03/07/2018   HGBA1C 7.0 (A) 01/10/2018   She is on: - Metformin ER 1000 mg with dinner -  >> Ozempic 1 mg weekly - Glipizide 5 mg after dinner! >> Moved to before dinner   She checks her sugars twice a day : - am:  113-172 >> 112-172, 185 >> 120-130 >> 118-175 >> 124-159 - 2h after b'fast: n/c - lunch: n/c >> 139 >> n/c - 2h after lunch: n/c - dinner: 103-163, 187 >> 110-152 >> 104-112 >> 94 >> n/c - 2h after dinner:110-181, 209 >> 150-165 >> 134-225, 250 >> 120-168,273 - bedtime: n/c Lowest: 101 >> 110 >> 104 >> 94 >> ; hypoglycemia awareness in the 70s. Highest: 390 (40 mg Prednisone) >> ... 250 (popsicles) >> 273 (fried rice, cake)  Meter:  Accu-Chek  No CKD: 12/16/2019: Glucose 122, BUN/creatinine 14/0.74, GFR 97 Lab Results  Component Value Date   BUN 11 06/10/2019   Lab Results  Component Value Date   CREATININE 0.75 06/10/2019  On losartan.  + HL: 12/16/2019:176/141/50/101 Lab Results  Component Value Date   CHOL 173 06/10/2019   HDL 53 06/10/2019   LDLCALC 92 06/10/2019   TRIG 161 (H) 06/10/2019  On Lipitor 20.  Last eye exam: 08/31//2021: No DR, but has abnormal intraocular pressure (remeasured IO pressure in 03/2019).   She also has a history of HTN, asthma, vitamin D deficiency.  She also has hair loss and sees Dr. Amy  McMichael, dermatologist at [facility].  She specializes in hair loss.  ROS: Constitutional: no weight gain/no weight loss, no fatigue, + subjective hyperthermia, no subjective hypothermia Eyes: no blurry vision, no xerophthalmia ENT: no sore throat, + see HPI Cardiovascular: no CP/no SOB/no palpitations/no leg swelling Respiratory: no cough/no SOB/no wheezing Gastrointestinal: no N/no V/no D/no C/no acid reflux Musculoskeletal: no muscle aches/no joint aches Skin: no rashes, + hair loss Neurological:+ tremors/no numbness/no tingling/no dizziness  I reviewed pt's medications, allergies, PMH, social hx, family hx, and changes were documented in the history of present illness. Otherwise, unchanged from my initial visit note.  Past Medical History:  Diagnosis Date   Allergy    Alopecia    Asthma    Back pain    Blood transfusion without reported diagnosis    with hysterectomy    Diabetes (HCC)    Diabetes mellitus (HCC)    GERD (gastroesophageal reflux disease)    Glaucoma    forming but no treatment- close evaluation per eye md    Hyperlipidemia    Hypertension    Joint pain    Lactose intolerance    Obesity    Raynauds syndrome    Swelling    Tremor    Vitamin D deficiency    Past Surgical History:  Procedure Laterality Date   ABDOMINAL HYSTERECTOMY     BREAST BIOPSY     COLONOSCOPY     MYOMECTOMY     POLYPECTOMY     Social History   Socioeconomic History   Marital status: Single    Spouse name: Not on file   Number of children: no  Social Needs  Occupational History   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 1 Inhaler 0   albuterol (VENTOLIN HFA) 108 (90 Base) MCG/ACT inhaler Inhale 1 puff into the lungs every 4 (four) hours as needed 18 g 0   Alcohol Swabs (ALCOHOL WIPES) 70 % PADS 30 Packages by Does not apply route 2 (two) times daily. 100 each 0   amLODipine (NORVASC) 5 MG tablet Take 1 tablet (5 mg total) by mouth every morning. 90 tablet 1   atorvastatin (LIPITOR) 20 MG tablet TAKE 1 TABLET BY MOUTH ONCE DAILY 90 tablet 1   atorvastatin (LIPITOR) 20 MG tablet Take 1 tablet (20 mg total) by mouth daily. 90 tablet 1     benzonatate (TESSALON) 200 MG capsule Take 1 capsule by mouth 3 times a day as  needed for cough 30 capsule 0   Blood Glucose Monitoring Suppl (FREESTYLE FREEDOM LITE) w/Device KIT Use to check blood sugars 1 kit 0   Cholecalciferol (VITAMIN D-3) 5000 UNIT/ML LIQD 1 tablet     fluticasone (FLONASE) 50 MCG/ACT nasal spray Place 2 sprays into both nostrils daily. 16 g 0   glipiZIDE (GLUCOTROL) 5 MG tablet TAKE 1-2 TABLETS (5-10 MG TOTAL) BY MOUTH DAILY BEFORE SUPPER. 180 tablet 3   glucose blood test strip USE TO CHECK BLOOD SUGAR 2 TIMES A DAY. (Patient taking differently: USE TO CHECK BLOOD SUGAR 2 TIMES A DAY.) 100 strip 12   hydrochlorothiazide (HYDRODIURIL) 25 MG tablet Take 1 tablet (25 mg total) by mouth every morning. 90 tablet 1   Lancets (FREESTYLE) lancets Use to check blood sugar 2 times a day. 200 each 12   losartan (COZAAR) 50 MG tablet Take 1 tablet (50 mg total) by mouth daily. 90 tablet 1   Melatonin 3 MG TABS Take 1 tablet by mouth at bedtime as needed.     metFORMIN (GLUCOPHAGE-XR) 500 MG 24 hr tablet TAKE 2 TABLETS (1,000 MG TOTAL) BY MOUTH AT BEDTIME. 180 tablet 3   montelukast (SINGULAIR) 10 MG tablet Take 1 tablet (10 mg total) by mouth every evening. 90 tablet 1   omeprazole (PRILOSEC) 20 MG capsule Take 1 capsule (20 mg total) by mouth daily at least 30 minutes before morning meal 90  capsule 0   Potassium Chloride ER 20 MEQ TBCR 1 tablet with food     potassium chloride SA (KLOR-CON) 20 MEQ tablet TAKE 1 TABLET BY MOUTH ONCE DAILY WITH FOOD (Patient taking differently: Take by mouth daily. with food) 90 tablet 2   propranolol (INDERAL) 20 MG tablet TAKE 1 TABLET BY MOUTH ONCE DAILY 90 tablet 1   propranolol (INDERAL) 20 MG tablet Take 1 tablet (20 mg total) by mouth daily. 90 tablet 1   Semaglutide, 1 MG/DOSE, (OZEMPIC, 1 MG/DOSE,) 4 MG/3ML SOPN Inject 1 mg into the skin once a week. 9 mL 3   No current facility-administered medications on file prior to visit.   Allergies  Allergen Reactions   Erythromycin Nausea Only   Milk-Related Compounds Other (See Comments)    Lactose intollerance   Tetanus-Diphth-Acell Pertussis Hives   Family History  Problem Relation Age of Onset   Multiple myeloma Mother    Hypertension Mother    Hyperlipidemia Mother    Cancer Mother        multiple Myeloma in remission    Obesity Mother    Hypertension Father    Hyperlipidemia Father    Cancer Father    Prostate cancer Father    Asthma Other    Hyperlipidemia Other    Hypertension Other    Cancer Other    COPD Other    Stroke Other    Breast cancer Maternal Aunt    Breast cancer Paternal Aunt    Breast cancer Maternal Aunt    Colon cancer Brother 27   Colon polyps Brother    Esophageal cancer Neg Hx    Rectal cancer Neg Hx    Stomach cancer Neg Hx     PE: BP 130/88 (BP Location: Right Arm, Patient Position: Sitting, Cuff Size: Normal)   Pulse 99   Ht 5' 4" (1.626 m)   Wt 252 lb 12.8 oz (114.7 kg)   LMP 08/31/2008  SpO2 97%   BMI 43.39 kg/m  Wt Readings from Last 3 Encounters:  09/25/20 252 lb 12.8 oz (114.7 kg)  05/22/20 252 lb 6.4 oz (114.5 kg)  04/21/20 255 lb (115.7 kg)   Constitutional: overweight, in NAD Eyes: PERRLA, EOMI, no exophthalmos ENT: moist mucous membranes, no thyromegaly, no cervical lymphadenopathy Cardiovascular: RRR, No  MRG Respiratory: CTA B Gastrointestinal: abdomen soft, NT, ND, BS+ Musculoskeletal: no deformities, strength intact in all 4 Skin: moist, warm, no rashes Neurological: no tremor with outstretched hands, DTR normal in all 4  ASSESSMENT: 1.  Mild subclinical thyrotoxicosis  2.  Multiple thyroid nodules Thyroid U/S (04/14/2017): FINDINGS: Parenchymal Echotexture: Mildly heterogenous Isthmus: 0.5 cm Right lobe: 4.5 x 2.1 x 1.3 cm Left lobe: 4.3 x 1.4 x 1.6 cm _________________________________________________________  Nodule # 1: Location: Right; Inferior Maximum size: 2.9 cm; Other 2 dimensions: 2.0 x 2.1 cm Composition: solid/almost completely solid (2) Echogenicity: hypoechoic (2) Shape: not taller-than-wide (0) Margins: smooth (0) Echogenic foci: none (0) ACR TI-RADS total points: 4. ACR TI-RADS risk category: TR4 (4-6 points).  ACR TI-RADS recommendations: **Given size (>/= 1.5 cm) and appearance, fine needle aspiration of this moderately suspicious nodule should be considered based on TI-RADS criteria.________________________________________________  Multiple other smaller nodules measure 0.7 cm or less in size and do not meet criteria for biopsy nor follow-up.  IMPRESSION: Right lower pole nodule 1 meets criteria for fine needle aspiration biopsy.   FNA: Adequacy Reason Satisfactory For Evaluation. Diagnosis THYROID, FINE NEEDLE ASPIRATION, RLP (SPECIMEN 1 OF 1,COLLECTED 05/11/17): CONSISTENT WITH BENIGN FOLLICULAR NODULE (BETHESDA CATEGORY II). JOSHUA KISH MD Pathologist, Electronic Signature (Case signed 05/15/2017) Specimen Clinical Information Right Inferior, 2.9cm; Other 2 dimensions: 2.0 x 2.1cm, solid/almost completely solid, hypoechoic, TI-RADS total points - 4, Moderately suspicious nodule Source Thyroid, Fine Needle Aspiration, Right, RLP (Specimen 1 of 1, collected on 05/11/17)  Thyroid U/S (06/05/2019): CLINICAL DATA:  Goiter. 58-year-old female  with a history of thyroid nodules. The 2.9 cm right inferior thyroid nodule was previously biopsied on 05/11/2017   Parenchymal Echotexture: Mildly heterogenous Isthmus: 0.4 cm Right lobe: 4.9 x 2.2 x 1.5 cm Left lobe: 4.0 x 1.6 x 1.3 cm _________________________________________________________   The previously biopsied nodule in the right inferior gland is unchanged at 2.9 x 2.2 x 2.1 cm. Additional small subcentimeter thyroid nodules again noted scattered throughout the left gland. None of these nodules demonstrates significant interval growth or change in morphology. These lesions do not meet criteria for biopsy or dedicated imaging surveillance.   IMPRESSION: No significant interval change in the size or appearance of the previously biopsied 2.9 cm nodule in the right inferior gland.   No new or suspicious thyroid nodules identified.  3. DM2, non-insulin-dependent  PLAN:  1. Patient with with a history of subclinical hypothyroidism with possible thyrotoxic symptoms: Hot flashes (however, she is also postmenopausal), palpitations with exertion, hair loss.  These are all chronic. -TFTs were normal at last check Lab Results  Component Value Date   TSH 0.52 05/22/2020   2.  Multiple thyroid nodules -She denies neck compression symptoms -Patient has a solid, right, 2.9 cm thyroid nodule on the ultrasound from 03/2017.  Biopsy of this nodule in 05/2017 was benign.  Repeat ultrasound in 06/2019 showed a stable nodule.  She also had several smaller nodules, not worrisome. -We discussed about repeating another thyroid ultrasound next spring.  If nodules appear to remain stable, this would conclude the 5 years of follow-up necessary to ensure benignity.  3.   DM2 -Patient with history of uncontrolled diabetes, with improved control in the last several months especially after switching from Victoza to [place].  Latest HbA1c obtained at last visit was 7.1%, decreased from 7.6%.  At that  time, however, she was eating many popsicles and sugars were higher at night, up to 250's.  As a consequence, her sugars were still higher than target in the morning.  I strongly advised her to stop popsicles at night.  We did not change her regimen at that time. -At today's visit, sugars appear to be mostly at goal at night, but they are mostly higher than goal in the morning in the last 2 weeks/m download.  However, they were higher in the last week due to several parties that she attended.  In the previous week, sugars are mostly at the upper limit of the normal range in the morning.  For now, I advised her to continue the current regimen but try to take 10 mg of glipizide before a larger dinner.  She is currently only using the 5 mg dose. - I suggested to: Patient Instructions  Please continue: - Metformin ER 1000 mg with dinner - Glipizide 5 mg (10 mg before a late or large) dinner - Ozempic 1 mg weekly  Please return in 4 months with your sugar log.   - we checked her HbA1c: 6.6% (better) - advised to check sugars at different times of the day - 1-2x a day, rotating check times - advised for yearly eye exams >> she is UTD - return to clinic in 4 months  Philemon Kingdom, MD PhD [facility]

## 2020-09-25 NOTE — Patient Instructions (Addendum)
Please continue: - Metformin ER 1000 mg with dinner - Glipizide 5 mg (10 mg before a late or large) dinner - Ozempic 1 mg weekly  Please return in 4 months with your sugar log.

## 2020-09-29 ENCOUNTER — Other Ambulatory Visit (HOSPITAL_COMMUNITY): Payer: Self-pay

## 2020-09-29 MED FILL — Glucose Blood Test Strip: 50 days supply | Qty: 100 | Fill #2 | Status: AC

## 2020-10-13 ENCOUNTER — Other Ambulatory Visit (HOSPITAL_COMMUNITY): Payer: Self-pay

## 2020-10-15 DIAGNOSIS — H43813 Vitreous degeneration, bilateral: Secondary | ICD-10-CM | POA: Diagnosis not present

## 2020-10-15 DIAGNOSIS — H524 Presbyopia: Secondary | ICD-10-CM | POA: Diagnosis not present

## 2020-10-15 DIAGNOSIS — H52223 Regular astigmatism, bilateral: Secondary | ICD-10-CM | POA: Diagnosis not present

## 2020-10-15 DIAGNOSIS — H40013 Open angle with borderline findings, low risk, bilateral: Secondary | ICD-10-CM | POA: Diagnosis not present

## 2020-10-15 DIAGNOSIS — I10 Essential (primary) hypertension: Secondary | ICD-10-CM | POA: Diagnosis not present

## 2020-10-15 DIAGNOSIS — H40053 Ocular hypertension, bilateral: Secondary | ICD-10-CM | POA: Diagnosis not present

## 2020-10-15 DIAGNOSIS — E119 Type 2 diabetes mellitus without complications: Secondary | ICD-10-CM | POA: Diagnosis not present

## 2020-10-15 DIAGNOSIS — H35033 Hypertensive retinopathy, bilateral: Secondary | ICD-10-CM | POA: Diagnosis not present

## 2020-10-15 DIAGNOSIS — H5213 Myopia, bilateral: Secondary | ICD-10-CM | POA: Diagnosis not present

## 2020-10-16 ENCOUNTER — Other Ambulatory Visit (HOSPITAL_COMMUNITY): Payer: Self-pay

## 2020-10-19 ENCOUNTER — Other Ambulatory Visit (HOSPITAL_COMMUNITY): Payer: Self-pay

## 2020-10-21 ENCOUNTER — Ambulatory Visit
Admission: RE | Admit: 2020-10-21 | Discharge: 2020-10-21 | Disposition: A | Discharge status: Home / Self Care | Payer: 59 | Source: Ambulatory Visit | Attending: Family Medicine | Admitting: Family Medicine

## 2020-10-21 ENCOUNTER — Other Ambulatory Visit: Payer: Self-pay

## 2020-10-21 DIAGNOSIS — Z1231 Encounter for screening mammogram for malignant neoplasm of breast: Secondary | ICD-10-CM

## 2020-11-20 ENCOUNTER — Other Ambulatory Visit: Payer: Self-pay

## 2020-11-20 ENCOUNTER — Ambulatory Visit: Payer: 59 | Attending: Internal Medicine

## 2020-11-20 ENCOUNTER — Other Ambulatory Visit (HOSPITAL_BASED_OUTPATIENT_CLINIC_OR_DEPARTMENT_OTHER): Payer: Self-pay

## 2020-11-20 DIAGNOSIS — Z23 Encounter for immunization: Secondary | ICD-10-CM

## 2020-11-20 MED ORDER — PFIZER COVID-19 VAC BIVALENT 30 MCG/0.3ML IM SUSP
INTRAMUSCULAR | 0 refills | Status: AC
  Filled 2020-11-20: qty 0.3, 1d supply, fill #0

## 2020-11-20 NOTE — Progress Notes (Signed)
   Covid-19 Vaccination Clinic  Name:  Veronica Russell    MRN: [id number] DOB: 11-27-1960  11/20/2020  [name] was observed post Covid-19 immunization for 15 minutes without incident. She was provided with Vaccine Information Sheet and instruction to access the V-Safe system.   [name] was instructed to call 911 with any severe reactions post vaccine: Difficulty breathing  Swelling of face and throat  A fast heartbeat  A bad rash all over body  Dizziness and weakness   Immunizations Administered     Name Date Dose VIS Date Route   Pfizer Covid-19 Vaccine Bivalent Booster 11/20/2020  1:42 PM 0.3 mL 09/30/2020 Intramuscular   Manufacturer: [place]   Lot: [id number]   [place]: [phone]

## 2020-11-21 MED FILL — Glucose Blood Test Strip: 50 days supply | Qty: 100 | Fill #3 | Status: AC

## 2020-11-23 ENCOUNTER — Other Ambulatory Visit (HOSPITAL_COMMUNITY): Payer: Self-pay

## 2020-11-25 ENCOUNTER — Other Ambulatory Visit (HOSPITAL_COMMUNITY): Payer: Self-pay

## 2020-12-09 ENCOUNTER — Other Ambulatory Visit (HOSPITAL_COMMUNITY): Payer: Self-pay

## 2020-12-09 MED ORDER — OMEPRAZOLE 20 MG PO CPDR
20.0000 mg | DELAYED_RELEASE_CAPSULE | Freq: Every day | ORAL | 0 refills | Status: DC
  Filled 2020-12-09: qty 90, 90d supply, fill #0

## 2020-12-09 MED ORDER — LOSARTAN POTASSIUM 50 MG PO TABS
50.0000 mg | ORAL_TABLET | Freq: Every day | ORAL | 0 refills | Status: DC
  Filled 2020-12-09: qty 90, 90d supply, fill #0

## 2020-12-22 DIAGNOSIS — E041 Nontoxic single thyroid nodule: Secondary | ICD-10-CM | POA: Diagnosis not present

## 2020-12-22 DIAGNOSIS — I10 Essential (primary) hypertension: Secondary | ICD-10-CM | POA: Diagnosis not present

## 2020-12-22 DIAGNOSIS — J4520 Mild intermittent asthma, uncomplicated: Secondary | ICD-10-CM | POA: Diagnosis not present

## 2020-12-22 DIAGNOSIS — E7800 Pure hypercholesterolemia, unspecified: Secondary | ICD-10-CM | POA: Diagnosis not present

## 2020-12-22 DIAGNOSIS — E1169 Type 2 diabetes mellitus with other specified complication: Secondary | ICD-10-CM | POA: Diagnosis not present

## 2020-12-22 DIAGNOSIS — Z0000 Encounter for general adult medical examination without abnormal findings: Secondary | ICD-10-CM | POA: Diagnosis not present

## 2021-01-08 DIAGNOSIS — E041 Nontoxic single thyroid nodule: Secondary | ICD-10-CM | POA: Diagnosis not present

## 2021-01-13 ENCOUNTER — Other Ambulatory Visit (HOSPITAL_COMMUNITY): Payer: Self-pay

## 2021-01-20 ENCOUNTER — Other Ambulatory Visit (HOSPITAL_COMMUNITY): Payer: Self-pay

## 2021-01-20 ENCOUNTER — Other Ambulatory Visit: Payer: Self-pay

## 2021-01-20 ENCOUNTER — Ambulatory Visit: Payer: 59 | Admitting: Internal Medicine

## 2021-01-20 ENCOUNTER — Encounter: Payer: Self-pay | Admitting: Internal Medicine

## 2021-01-20 VITALS — BP 130/80 | HR 103 | Ht 64.0 in | Wt 261.6 lb

## 2021-01-20 DIAGNOSIS — E1165 Type 2 diabetes mellitus with hyperglycemia: Secondary | ICD-10-CM | POA: Diagnosis not present

## 2021-01-20 DIAGNOSIS — E042 Nontoxic multinodular goiter: Secondary | ICD-10-CM | POA: Diagnosis not present

## 2021-01-20 DIAGNOSIS — E0590 Thyrotoxicosis, unspecified without thyrotoxic crisis or storm: Secondary | ICD-10-CM | POA: Diagnosis not present

## 2021-01-20 LAB — POCT GLYCOSYLATED HEMOGLOBIN (HGB A1C): Hemoglobin A1C: 7.6 % — AB (ref 4.0–5.6)

## 2021-01-20 MED ORDER — METFORMIN HCL ER 500 MG PO TB24
1000.0000 mg | ORAL_TABLET | Freq: Two times a day (BID) | ORAL | 3 refills | Status: DC
  Filled 2021-01-20 (×2): qty 360, 90d supply, fill #0
  Filled 2021-05-25: qty 360, 90d supply, fill #1
  Filled 2021-08-23: qty 360, 90d supply, fill #2
  Filled 2021-11-16: qty 360, 90d supply, fill #3

## 2021-01-20 NOTE — Progress Notes (Signed)
Patient ID: Veronica Russell, female   DOB: 1960-02-08, 60 y.o.   MRN: [id number]    This visit occurred during the SARS-CoV-2 public health emergency.  Safety protocols were in place, including screening questions prior to the visit, additional usage of staff PPE, and extensive cleaning of exam room while observing appropriate contact time as indicated for disinfecting solutions.   HPI  Veronica Russell is a 60 y.o.-year-old female, returning for follow-up for h/o subclinical hyperthyroidism, right thyroid nodule, and DM2, non-insulin-dependent.  Last visit 4 months ago.  Interim history: No increased urination, blurry vision, nausea, chest pain. 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  In the last 2 months, she had more dietary indiscretions, which intensified in the last month due to several parties with coworkers and family.  Sugars increased.  Subclinical hyperthyroidism:  Patient was found to have a slightly low TSH at her first visit in the weight loss clinic in 01/2016. Her TFTs normalized after 2018.  Reviewed her TFTs: 12/22/2020: TSH 0.65 Lab Results  Component Value Date   TSH 0.52 05/22/2020   TSH 0.57 09/27/2019   TSH 0.63 09/20/2018   TSH 0.50 10/05/2017   TSH 0.52 04/04/2017   TSH 0.436 (L) 09/29/2016   TSH 0.443 (L) 05/09/2016   TSH 0.425 (L) 01/27/2016   FREET4 0.83 05/22/2020   FREET4 0.94 09/27/2019   FREET4 0.80 09/20/2018   FREET4 0.75 10/05/2017   FREET4 0.75 04/04/2017   FREET4 1.17 09/29/2016   FREET4 1.24 05/09/2016   FREET4 1.17 01/27/2016    Lab Results  Component Value Date   T3FREE 3.7 05/22/2020   T3FREE 4.2 09/27/2019   T3FREE 3.4 09/20/2018   T3FREE 3.9 10/05/2017   T3FREE 3.7 04/04/2017    She has chronic: Hot flashes Palpitations with exertion Hair loss Hereditary tremors  Pt does have a FH of thyroid ds. - M aunt. No FH of thyroid cancer. No h/o radiation tx to head or neck.  No herbal supplements.  No Biotin use. No recent steroids use.   Right thyroid nodule:  Reviewed previous investigation: 04/14/2017: Thyroid ultrasound: 2.9 x 2 x 2.7 solid, hypoechoic, right thyroid nodule 05/11/2017: FNA of the dominant thyroid nodule: Benign 06/05/2019: Thyroid ultrasound: Stable nodules  Pt denies: - feeling nodules in neck - hoarseness - dysphagia - choking - SOB with lying down  DM2:  Reviewed HbA1c levels: Lab Results  Component Value Date   HGBA1C 6.6 (A) 09/25/2020   HGBA1C 7.1 (A) 05/22/2020   HGBA1C 7.3 (A) 01/17/2020   HGBA1C 6.8 (A) 09/27/2019   HGBA1C 7.3 (A) 05/23/2019   HGBA1C 7.6 (H) 02/12/2019   HGBA1C 7.1 (A) 01/21/2019   HGBA1C 7.1 (A) 09/20/2018   HGBA1C 6.8 (H) 07/19/2018   HGBA1C 7.2 (H) 03/07/2018   She is on: - Metformin ER 1000 mg with dinner -  >> Ozempic 1 mg weekly - Glipizide 5 mg after dinner! >> Moved to before dinner - occasionally forgets it  She checks her sugars twice a day : - am: 11120-130 >> 118-175 >> 124-159 >> 148-162, 182, 337 - 2h after b'fast: n/c - lunch: n/c >> 139 >> n/c - 2h after lunch: n/c - dinner: 103-163, 187 >> 110-152 >> 104-112 >> 94 >> n/c - 2h after dinner:150-165 >> 134-225, 250 >> 120-168,273 >> 127-253 - bedtime: n/c Lowest: 101 >> 110 >> 104 >> 94 >> 127; hypoglycemia awareness in the 70s. Highest: 390 (40 mg Prednisone) >> .Marland KitchenMarland Kitchen  250 (popsicles) >> 273 (fried rice, cake) >> 337  Meter: Accu-Chek  No CKD: 12/22/2020: Glucose 111, BUN/creatinine 15/0.68, GFR 99 12/16/2019: Glucose 122, BUN/creatinine 14/0.74, GFR 97 Lab Results  Component Value Date   BUN 11 06/10/2019   Lab Results  Component Value Date   CREATININE 0.75 06/10/2019  On losartan.  + HL: 12/22/2020: 179/152/57/95 12/16/2019:176/141/50/101 Lab Results  Component Value Date   CHOL 173 06/10/2019   HDL 53 06/10/2019   LDLCALC 92 06/10/2019   TRIG 161 (H) 06/10/2019  On Lipitor 20.  Last eye exam: 10/15/2020: No DR, but hypertensive  retinopathy - Dr. Marica Otter.  She also has a history of HTN, asthma, vitamin D deficiency.  She also has hair loss and sees Dr. Lois Huxley, dermatologist at [facility].  She specializes in hair loss.  ROS: + see HPI + hair loss + Hot flashes  I reviewed pt's medications, allergies, PMH, social hx, family hx, and changes were documented in the history of present illness. Otherwise, unchanged from my initial visit note.  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- close evaluation per eye md    Hyperlipidemia    Hypertension    Joint pain    Lactose intolerance    Obesity    Raynauds syndrome    Swelling    Tremor    Vitamin D deficiency    Past Surgical History:  Procedure Laterality Date   ABDOMINAL HYSTERECTOMY     BREAST BIOPSY     COLONOSCOPY     MYOMECTOMY     POLYPECTOMY     Social History   Socioeconomic History   Marital status: Single    Spouse name: Not on file   Number of children: no  Social Needs  Occupational History   Occupation: Corporate treasurer    Employer: [employer]  Tobacco Use   Smoking status: Never Smoker   Smokeless tobacco: Never Used  Substance and Sexual Activity   Alcohol use:  Wine, 2-3 times a year, 1 drink   Drug use: No   Current Outpatient Medications on File Prior to Visit  Medication Sig Dispense Refill   albuterol (VENTOLIN HFA) 108 (90 Base) MCG/ACT inhaler Inhale 2 puffs into the lungs every 6 (six) hours as needed for wheezing or shortness of breath. 1 Inhaler 0   albuterol (VENTOLIN HFA) 108 (90 Base) MCG/ACT inhaler Inhale 1 puff into the lungs every 4 (four) hours as needed 18 g 0   Alcohol Swabs (ALCOHOL WIPES) 70 % PADS 30 Packages by Does not apply route 2 (two) times  daily. 100 each 0   amLODipine (NORVASC) 5 MG tablet Take 1 tablet (5 mg total) by mouth every morning. 90 tablet 1   atorvastatin (LIPITOR) 20 MG tablet TAKE 1 TABLET BY MOUTH ONCE DAILY 90 tablet 1   atorvastatin (LIPITOR) 20 MG tablet Take 1 tablet (20 mg total) by mouth daily. 90 tablet 1   benzonatate (TESSALON) 200 MG capsule Take 1 capsule by mouth 3 times a day as  needed for cough 30 capsule 0   Blood Glucose Monitoring Suppl (FREESTYLE FREEDOM LITE) w/Device KIT Use to check blood  sugars 1 kit 0   Cholecalciferol (VITAMIN D-3) 5000 UNIT/ML LIQD 1 tablet     COVID-19 mRNA bivalent vaccine, Pfizer, (PFIZER COVID-19 VAC BIVALENT) injection Inject into the muscle. 0.3 mL 0   fluticasone (FLONASE) 50 MCG/ACT nasal spray Place 2 sprays into both nostrils daily. 16 g 0   glipiZIDE (GLUCOTROL) 5 MG tablet Take 1-2 tablets (5-10 mg total) by mouth daily before supper. 180 tablet 3   glucose blood test strip USE TO CHECK BLOOD SUGAR 2 TIMES A DAY. (Patient taking differently: USE TO CHECK BLOOD SUGAR 2 TIMES A DAY.) 100 strip 12   hydrochlorothiazide (HYDRODIURIL) 25 MG tablet Take 1 tablet (25 mg total) by mouth every morning. 90 tablet 1   Lancets (FREESTYLE) lancets Use to check blood sugar 2 times a day. 200 each 12   losartan (COZAAR) 50 MG tablet Take 1 tablet (50 mg total) by mouth daily. 90 tablet 0   Melatonin 3 MG TABS Take 1 tablet by mouth at bedtime as needed.     metFORMIN (GLUCOPHAGE-XR) 500 MG 24 hr tablet Take 2 tablets (1,000 mg total) by mouth at bedtime. 180 tablet 3   montelukast (SINGULAIR) 10 MG tablet Take 1 tablet (10 mg total) by mouth every evening. 90 tablet 1   omeprazole (PRILOSEC) 20 MG capsule Take 1 capsule (20 mg total) by mouth daily at least 30 minutes before morning meal 90 capsule 0   Potassium Chloride ER 20 MEQ TBCR 1 tablet with food     potassium chloride SA (KLOR-CON) 20 MEQ tablet TAKE 1 TABLET BY MOUTH ONCE DAILY WITH FOOD (Patient taking differently:  Take by mouth daily. with food) 90 tablet 2   propranolol (INDERAL) 20 MG tablet TAKE 1 TABLET BY MOUTH ONCE DAILY 90 tablet 1   propranolol (INDERAL) 20 MG tablet Take 1 tablet (20 mg total) by mouth daily. 90 tablet 1   Semaglutide, 1 MG/DOSE, (OZEMPIC, 1 MG/DOSE,) 4 MG/3ML SOPN Inject 1 mg into the skin once a week. 9 mL 3   No current facility-administered medications on file prior to visit.   Allergies  Allergen Reactions   Erythromycin Nausea Only   Milk-Related Compounds Other (See Comments)    Lactose intollerance   Tetanus-Diphth-Acell Pertussis Hives   Family History  Problem Relation Age of Onset   Multiple myeloma Mother    Hypertension Mother    Hyperlipidemia Mother    Cancer Mother        multiple Myeloma in remission    Obesity Mother    Hypertension Father    Hyperlipidemia Father    Cancer Father    Prostate cancer Father    Asthma Other    Hyperlipidemia Other    Hypertension Other    Cancer Other    COPD Other    Stroke Other    Breast cancer Maternal Aunt    Breast cancer Paternal Aunt    Breast cancer Maternal Aunt    Colon cancer Brother 73   Colon polyps Brother    Esophageal cancer Neg Hx    Rectal cancer Neg Hx    Stomach cancer Neg Hx     PE: LMP 08/31/2008  Wt Readings from Last 3 Encounters:  09/25/20 252 lb 12.8 oz (114.7 kg)  05/22/20 252 lb 6.4 oz (114.5 kg)  04/21/20 255 lb (115.7 kg)   Constitutional: overweight, in NAD Eyes: PERRLA, EOMI, no exophthalmos ENT: moist mucous membranes, no thyromegaly, no cervical lymphadenopathy Cardiovascular: RRR, No MRG Respiratory:  CTA B Musculoskeletal: no deformities, strength intact in all 4 Skin: moist, warm, no rashes Neurological: no tremor with outstretched hands, DTR normal in all 4  ASSESSMENT: 1.  Mild subclinical thyrotoxicosis  2.  Multiple thyroid nodules Thyroid U/S (04/14/2017): FINDINGS: Parenchymal Echotexture: Mildly heterogenous Isthmus: 0.5 cm Right lobe: 4.5 x  2.1 x 1.3 cm Left lobe: 4.3 x 1.4 x 1.6 cm _________________________________________________________  Nodule # 1: Location: Right; Inferior Maximum size: 2.9 cm; Other 2 dimensions: 2.0 x 2.1 cm Composition: solid/almost completely solid (2) Echogenicity: hypoechoic (2) Shape: not taller-than-wide (0) Margins: smooth (0) Echogenic foci: none (0) ACR TI-RADS total points: 4. ACR TI-RADS risk category: TR4 (4-6 points).  ACR TI-RADS recommendations: **Given size (>/= 1.5 cm) and appearance, fine needle aspiration of this moderately suspicious nodule should be considered based on TI-RADS criteria.________________________________________________  Multiple other smaller nodules measure 0.7 cm or less in size and do not meet criteria for biopsy nor follow-up.  IMPRESSION: Right lower pole nodule 1 meets criteria for fine needle aspiration biopsy.   FNA: Adequacy Reason Satisfactory For Evaluation. Diagnosis THYROID, FINE NEEDLE ASPIRATION, RLP (SPECIMEN 1 OF 1,COLLECTED 05/11/17): CONSISTENT WITH BENIGN FOLLICULAR NODULE ([place] CATEGORY II). Enid Cutter MD Pathologist, Electronic Signature (Case signed 05/15/2017) Specimen Clinical Information Right Inferior, 2.9cm; Other 2 dimensions: 2.0 x 2.1cm, solid/almost completely solid, hypoechoic, TI-RADS total points - 4, Moderately suspicious nodule Source Thyroid, Fine Needle Aspiration, Right, RLP (Specimen 1 of 1, collected on 05/11/17)  Thyroid U/S (06/05/2019): CLINICAL DATA:  Goiter. 60 year old female with a history of thyroid nodules. The 2.9 cm right inferior thyroid nodule was previously biopsied on 05/11/2017   Parenchymal Echotexture: Mildly heterogenous Isthmus: 0.4 cm Right lobe: 4.9 x 2.2 x 1.5 cm Left lobe: 4.0 x 1.6 x 1.3 cm _________________________________________________________   The previously biopsied nodule in the right inferior gland is unchanged at 2.9 x 2.2 x 2.1 cm. Additional small  subcentimeter thyroid nodules again noted scattered throughout the left gland. None of these nodules demonstrates significant interval growth or change in morphology. These lesions do not meet criteria for biopsy or dedicated imaging surveillance.   IMPRESSION: No significant interval change in the size or appearance of the previously biopsied 2.9 cm nodule in the right inferior gland.   No new or suspicious thyroid nodules identified.  3. DM2, non-insulin-dependent  PLAN:  1. Patient with with history of subclinical hyperthyroidism with possible thyrotoxic symptoms: Hot flashes (but she is also postmenopausal), palpitations with exertion, hair loss.  These are all chronic. -TSH was normal at last check: 12/22/2020: 0.65  2.  Multiple thyroid nodules -She denies neck compression symptoms -She has a solid, right, 2.9 cm thyroid nodule visible on the ultrasound from 03/2017.  Biopsy of this nodule in 05/2017 was benign.  A repeat ultrasound in 06/2019 showed that the nodule was stable.  She also had several smaller nodules, not worrisome. -We discussed about repeating another ultrasound now, however, she tells me that she already had one ordered by PCP.  Unfortunately, I do not have these results or images... We will try to obtain the records from [place].  If nodules appear to be stable, this would document stability over 5 years and no further investigation is needed  3. DM2 -Patient with history of uncontrolled diabetes with improved control in the last months especially after switching from Victoza to [place].  HbA1c at last visit was decreased further, to 6.6%.  At that time, sugars appear to be mostly at goal at night  but they were mostly higher than goal in the morning due to several parties that she attended before last visit.  They did start to improve right before I saw her.  We continued the same regimen but I did advise her to try to increase the dose of glipizide before a larger  dinner from 5 to 10 mg. -At this visit, she had many dietary indiscretions and sugars are higher.  She also forgets glipizide if she is eating out.  We discussed about using this consistently, between 5 to 10 mg depending on the size of her meals.  Also, discussed about trying to improve her diet but in the meantime we will also try to increase metformin ER to the target dose of 2000 mg daily.  I advised her that she can either take 1000 mg twice a day for more than 2000 mg at dinnertime. - I suggested to: Patient Instructions  Please Increase: - Metformin ER to 1000 mg 2x a day with meals (you can take 2000 mg with dinner instead)  Use: - Glipizide 5-10 mg before dinner - Ozempic 1 mg weekly  Please return in 3-4 months with your sugar log.   - we checked her HbA1c: 7.6% (higher) - advised to check sugars at different times of the day - 1x a day, rotating check times - advised for yearly eye exams >> she is UTD - I reviewed her annual labs per [facility] records in [place] - return to clinic in 4 months  Philemon Kingdom, MD PhD [facility] Endocrinology

## 2021-01-20 NOTE — Patient Instructions (Addendum)
Please Increase: - Metformin ER to 1000 mg 2x a day with meals (you can take 2000 mg with dinner instead)  Use: - Glipizide 5-10 mg before dinner - Ozempic 1 mg weekly  Please return in 3-4 months with your sugar log.

## 2021-01-27 ENCOUNTER — Telehealth: Payer: 59 | Admitting: Physician Assistant

## 2021-01-27 ENCOUNTER — Other Ambulatory Visit (HOSPITAL_COMMUNITY): Payer: Self-pay

## 2021-01-27 DIAGNOSIS — J111 Influenza due to unidentified influenza virus with other respiratory manifestations: Secondary | ICD-10-CM | POA: Diagnosis not present

## 2021-01-27 MED ORDER — FLUTICASONE PROPIONATE 50 MCG/ACT NA SUSP
2.0000 | Freq: Every day | NASAL | 0 refills | Status: AC
  Filled 2021-01-27: qty 16, 30d supply, fill #0

## 2021-01-27 MED ORDER — OSELTAMIVIR PHOSPHATE 75 MG PO CAPS
75.0000 mg | ORAL_CAPSULE | Freq: Two times a day (BID) | ORAL | 0 refills | Status: AC
  Filled 2021-01-27: qty 10, 5d supply, fill #0

## 2021-01-27 NOTE — Progress Notes (Signed)
E visit for Flu like symptoms   We are sorry that you are not feeling well.  Here is how we plan to help! Based on what you have shared with me it looks like you may have a respiratory virus that may be influenza.  Given the short time of your symptoms, it is very unlikely that this is a sinus infection.  I will prescribe Flonase to help with nasal congestion and sore throat.  I will prescribe Tamiflu.  This may cause nausea, vomiting and diarrhea.  If you are able to take it influenza test before starting the medication I would recommend this as there are some other viruses going around which are not influenza and will not be treated by Tamiflu.  Influenza or the flu is   an infection caused by a respiratory virus. The flu virus is highly contagious and persons who did not receive their yearly flu vaccination may catch the flu from close contact.  We have anti-viral medications to treat the viruses that cause this infection. They are not a cure and only shorten the course of the infection. These prescriptions are most effective when they are given within the first 2 days of flu symptoms. Antiviral medication are indicated if you have a high risk of complications from the flu. You should  also consider an antiviral medication if you are in close contact with someone who is at risk. These medications can help patients avoid complications from the flu  but have side effects that you should know. Possible side effects from Tamiflu or oseltamivir include nausea, vomiting, diarrhea, dizziness, headaches, eye redness, sleep problems or other respiratory symptoms. You should not take Tamiflu if you have an allergy to oseltamivir or any to the ingredients in Tamiflu.  Based upon your symptoms and potential risk factors I have prescribed Oseltamivir (Tamiflu).  It has been sent to your designated pharmacy.  You will take one 75 mg capsule orally twice a day for the next 5 days. and I recommend that you  follow the flu symptoms recommendation that I have listed below.  ANYONE WHO HAS FLU SYMPTOMS SHOULD: Stay home. The flu is highly contagious and going out or to work exposes others! Be sure to drink plenty of fluids. Water is fine as well as fruit juices, sodas and electrolyte beverages. You may want to stay away from caffeine or alcohol. If you are nauseated, try taking small sips of liquids. How do you know if you are getting enough fluid? Your urine should be a pale yellow or almost colorless. Get rest. Taking a steamy shower or using a humidifier may help nasal congestion and ease sore throat pain. Using a saline nasal spray works much the same way. Cough drops, hard candies and sore throat lozenges may ease your cough. Line up a caregiver. Have someone check on you regularly.   GET HELP RIGHT AWAY IF: You cannot keep down liquids or your medications. You become short of breath Your fell like you are going to pass out or loose consciousness. Your symptoms persist after you have completed your treatment plan MAKE SURE YOU  Understand these instructions. Will watch your condition. Will get help right away if you are not doing well or get worse.  Your e-visit answers were reviewed by a board certified advanced clinical practitioner to complete your personal care plan.  Depending on the condition, your plan could have included both over the counter or prescription medications.  If there is a problem please  reply  once you have received a response from your provider.  Your safety is important to [name].  If you have drug allergies check your prescription carefully.    You can use MyChart to ask questions about todays visit, request a non-urgent call back, or ask for a work or school excuse for 24 hours related to this e-Visit. If it has been greater than 24 hours you will need to follow up with your provider, or enter a new e-Visit to address those concerns.  You will get an e-mail in the  next two days asking about your experience.  I hope that your e-visit has been valuable and will speed your recovery. Thank you for using e-visits.   Greater than 5 minutes, yet less than 10 minutes of time have been spent researching, coordinating, and implementing care for this patient today

## 2021-02-13 MED FILL — Glucose Blood Test Strip: 50 days supply | Qty: 100 | Fill #4 | Status: AC

## 2021-02-15 ENCOUNTER — Other Ambulatory Visit (HOSPITAL_COMMUNITY): Payer: Self-pay

## 2021-03-09 ENCOUNTER — Other Ambulatory Visit (HOSPITAL_COMMUNITY): Payer: Self-pay

## 2021-03-09 MED ORDER — ATORVASTATIN CALCIUM 20 MG PO TABS
20.0000 mg | ORAL_TABLET | Freq: Every day | ORAL | 0 refills | Status: DC
  Filled 2021-03-09: qty 90, 90d supply, fill #0

## 2021-03-09 MED ORDER — PROPRANOLOL HCL 20 MG PO TABS
20.0000 mg | ORAL_TABLET | Freq: Every day | ORAL | 1 refills | Status: DC
  Filled 2021-03-09: qty 90, 90d supply, fill #0
  Filled 2021-06-09: qty 90, 90d supply, fill #1

## 2021-03-10 ENCOUNTER — Other Ambulatory Visit (HOSPITAL_COMMUNITY): Payer: Self-pay

## 2021-03-10 MED ORDER — AMLODIPINE BESYLATE 5 MG PO TABS
5.0000 mg | ORAL_TABLET | Freq: Every morning | ORAL | 2 refills | Status: AC
  Filled 2021-03-10: qty 90, 90d supply, fill #0
  Filled 2021-06-09: qty 90, 90d supply, fill #1
  Filled 2021-09-02: qty 90, 90d supply, fill #2

## 2021-03-10 MED ORDER — MONTELUKAST SODIUM 10 MG PO TABS
10.0000 mg | ORAL_TABLET | Freq: Every evening | ORAL | 2 refills | Status: AC
  Filled 2021-03-10: qty 90, 90d supply, fill #0
  Filled 2021-06-09: qty 90, 90d supply, fill #1
  Filled 2021-09-02: qty 90, 90d supply, fill #2

## 2021-03-10 MED ORDER — HYDROCHLOROTHIAZIDE 25 MG PO TABS
25.0000 mg | ORAL_TABLET | Freq: Every morning | ORAL | 2 refills | Status: DC
  Filled 2021-03-10: qty 90, 90d supply, fill #0
  Filled 2021-06-09: qty 90, 90d supply, fill #1
  Filled 2021-09-02: qty 90, 90d supply, fill #2

## 2021-03-10 MED ORDER — OMEPRAZOLE 20 MG PO CPDR
20.0000 mg | DELAYED_RELEASE_CAPSULE | Freq: Every day | ORAL | 2 refills | Status: AC
  Filled 2021-03-10: qty 90, 90d supply, fill #0
  Filled 2021-06-09: qty 90, 90d supply, fill #1
  Filled 2021-09-02: qty 90, 90d supply, fill #2

## 2021-03-10 MED ORDER — LOSARTAN POTASSIUM 50 MG PO TABS
50.0000 mg | ORAL_TABLET | Freq: Every day | ORAL | 2 refills | Status: DC
  Filled 2021-03-10: qty 90, 90d supply, fill #0
  Filled 2021-06-09: qty 90, 90d supply, fill #1
  Filled 2021-09-02: qty 90, 90d supply, fill #2

## 2021-03-11 ENCOUNTER — Other Ambulatory Visit (HOSPITAL_COMMUNITY): Payer: Self-pay

## 2021-03-11 MED ORDER — PROPRANOLOL HCL 20 MG PO TABS
20.0000 mg | ORAL_TABLET | Freq: Every day | ORAL | 2 refills | Status: DC
  Filled 2021-03-11: qty 90, 90d supply, fill #0

## 2021-03-11 MED ORDER — ATORVASTATIN CALCIUM 20 MG PO TABS
20.0000 mg | ORAL_TABLET | Freq: Every day | ORAL | 2 refills | Status: DC
  Filled 2021-03-11 – 2021-06-09 (×2): qty 90, 90d supply, fill #0
  Filled 2021-09-02: qty 90, 90d supply, fill #1
  Filled 2021-12-01: qty 90, 90d supply, fill #2

## 2021-04-07 ENCOUNTER — Other Ambulatory Visit: Payer: Self-pay | Admitting: Internal Medicine

## 2021-04-07 ENCOUNTER — Other Ambulatory Visit (HOSPITAL_COMMUNITY): Payer: Self-pay

## 2021-04-07 MED ORDER — FREESTYLE LITE TEST VI STRP
1.0000 | ORAL_STRIP | Freq: Two times a day (BID) | 11 refills | Status: DC
  Filled 2021-04-07: qty 100, 50d supply, fill #0
  Filled 2021-05-25: qty 100, 50d supply, fill #1
  Filled 2021-08-04: qty 100, 50d supply, fill #2
  Filled 2021-09-29: qty 100, 50d supply, fill #3
  Filled 2021-11-16: qty 100, 50d supply, fill #4
  Filled 2022-02-14 (×2): qty 100, 50d supply, fill #5

## 2021-05-10 ENCOUNTER — Other Ambulatory Visit (HOSPITAL_COMMUNITY): Payer: Self-pay

## 2021-05-25 ENCOUNTER — Other Ambulatory Visit (HOSPITAL_COMMUNITY): Payer: Self-pay

## 2021-05-25 ENCOUNTER — Encounter: Payer: Self-pay | Admitting: Internal Medicine

## 2021-05-25 ENCOUNTER — Ambulatory Visit: Payer: 59 | Admitting: Internal Medicine

## 2021-05-25 VITALS — BP 120/88 | HR 100 | Ht 64.0 in | Wt 255.6 lb

## 2021-05-25 DIAGNOSIS — E042 Nontoxic multinodular goiter: Secondary | ICD-10-CM

## 2021-05-25 DIAGNOSIS — E1165 Type 2 diabetes mellitus with hyperglycemia: Secondary | ICD-10-CM | POA: Diagnosis not present

## 2021-05-25 DIAGNOSIS — E0590 Thyrotoxicosis, unspecified without thyrotoxic crisis or storm: Secondary | ICD-10-CM

## 2021-05-25 LAB — POCT GLYCOSYLATED HEMOGLOBIN (HGB A1C): Hemoglobin A1C: 7.4 % — AB (ref 4.0–5.6)

## 2021-05-25 MED ORDER — DAPAGLIFLOZIN PROPANEDIOL 5 MG PO TABS
5.0000 mg | ORAL_TABLET | Freq: Every day | ORAL | 3 refills | Status: DC
  Filled 2021-05-25: qty 90, 90d supply, fill #0
  Filled 2021-08-23: qty 90, 90d supply, fill #1
  Filled 2021-11-16: qty 90, 90d supply, fill #2
  Filled 2022-02-14 (×2): qty 90, 90d supply, fill #3

## 2021-05-25 NOTE — Patient Instructions (Addendum)
Please continue: ?- Metformin ER 1500 mg with dinner ?- Glipizide 5 mg before dinner ?- Ozempic 1 mg weekly ? ?Please start: ?- Farxiga 5 mg before b'fast ? ?Please return in 4 months with your sugar log.  ?

## 2021-05-25 NOTE — Progress Notes (Signed)
Patient ID: Veronica Russell, female   DOB: 25-Oct-1960, 61 y.o.   MRN: [id number]  ?  ?This visit occurred during the SARS-CoV-2 public health emergency.  Safety protocols were in place, including screening questions prior to the visit, additional usage of staff PPE, and extensive cleaning of exam room while observing appropriate contact time as indicated for disinfecting solutions.  ? ?HPI  ?Veronica Russell is a 61 y.o.-year-old female, returning for follow-up for h/o subclinical hyperthyroidism, right thyroid nodule, and DM2, non-insulin-dependent.  Last visit 4 months ago. ? ?Interim history: ?No increased urination, blurry vision, nausea, chest pain.  She lost 6 pounds since last visit. ?She has more tremors at this visit that she had to decrease propranolol dose since last visit due to wheezing. 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 Stress is even higher now.  Her sugars have been more fluctuating. ?She is looking for a new PCP, since Dr. Radene Ou is leaving. ? ?Subclinical hyperthyroidism: ? ?Patient was found to have a slightly low TSH at her first visit in the weight loss clinic in 01/2016. Her TFTs normalized after 2018. ? ?Reviewed her TFTs: ?12/22/2020: TSH 0.65 ?Lab Results  ?Component Value Date  ? TSH 0.52 05/22/2020  ? TSH 0.57 09/27/2019  ? TSH 0.63 09/20/2018  ? TSH 0.50 10/05/2017  ? TSH 0.52 04/04/2017  ? TSH 0.436 (L) 09/29/2016  ? TSH 0.443 (L) 05/09/2016  ? TSH 0.425 (L) 01/27/2016  ? FREET4 0.83 05/22/2020  ? FREET4 0.94 09/27/2019  ? FREET4 0.80 09/20/2018  ? FREET4 0.75 10/05/2017  ? FREET4 0.75 04/04/2017  ? FREET4 1.17 09/29/2016  ? FREET4 1.24 05/09/2016  ? FREET4 1.17 01/27/2016  ?  ?Lab Results  ?Component Value Date  ? T3FREE 3.7 05/22/2020  ? T3FREE 4.2 09/27/2019  ? T3FREE 3.4 09/20/2018  ? T3FREE 3.9 10/05/2017  ? T3FREE 3.7 04/04/2017  ?  ?She has chronic: ?Hot flashes ?Palpitations with exertion ?Hair loss ?Hereditary tremors ? ?Pt does have a FH  of thyroid ds. - M aunt. No FH of thyroid cancer. No h/o radiation tx to head or neck. ?No herbal supplements. No Biotin use. No recent steroids use.  ? ?Right thyroid nodule: ? ?Reviewed previous investigation: ?04/14/2017: Thyroid ultrasound: 2.9 x 2 x 2.7 solid, hypoechoic, right thyroid nodule ?05/11/2017: FNA of the dominant thyroid nodule: Benign ?06/05/2019: Thyroid ultrasound: Stable nodules ?01/08/2021: Thyroid ultrasound: Stable nodules, slightly enlarged left neck lymph node ? ?Pt denies: ?- feeling nodules in neck ?- hoarseness ?- dysphagia ?- choking ?- SOB with lying down ? ?DM2: ? ?Reviewed HbA1c levels: ?Lab Results  ?Component Value Date  ? HGBA1C 7.6 (A) 01/20/2021  ? HGBA1C 6.6 (A) 09/25/2020  ? HGBA1C 7.1 (A) 05/22/2020  ? HGBA1C 7.3 (A) 01/17/2020  ? HGBA1C 6.8 (A) 09/27/2019  ? HGBA1C 7.3 (A) 05/23/2019  ? HGBA1C 7.6 (H) 02/12/2019  ? HGBA1C 7.1 (A) 01/21/2019  ? HGBA1C 7.1 (A) 09/20/2018  ? HGBA1C 6.8 (H) 07/19/2018  ? ?She is on: ?- Metformin ER 1000 mg with dinner >> 1500 mg daily with dinner ?-  >> Ozempic 1 mg weekly ?- Glipizide 5 mg after dinner! >> Moved to before dinner - occasionally forgets it ? ?She checks her sugars twice a day : ?- am: 118-175 >> 124-159 >> 148-162, 182, 337 >> 115,137-159 ?- 2h after b'fast: n/c ?- lunch: n/c >> 139 >> n/c ?- 2h after lunch: n/c ?- dinner: 103-163, 187 >> 110-152 >>  104-112 >> 94 >> n/c ?- 2h after dinner: 134-225, 250 >> 120-168,273 >> 127-253 >> 136-175, 232 ?- bedtime: n/c ?Lowest: 101 >> 110 >> 104 >> 94 >> 127 >> 115; hypoglycemia awareness in the 70s. ?Highest: 390 (40 mg Prednisone) >> ... 273 (fried rice, cake) >> 337 >> 232 (pizza). ?Meter: Accu-Chek ? ?No CKD: ?12/22/2020: Glucose 111, BUN/creatinine 15/0.68, GFR 99 ?12/16/2019: Glucose 122, BUN/creatinine 14/0.74, GFR 97 ?Lab Results  ?Component Value Date  ? BUN 11 06/10/2019  ? ?Lab Results  ?Component Value Date  ? CREATININE 0.75 06/10/2019  ?On losartan. ? ?+ HL: ?12/22/2020:  179/152/57/95 ?12/16/2019:176/141/50/101 ?Lab Results  ?Component Value Date  ? CHOL 173 06/10/2019  ? HDL 53 06/10/2019  ? [place] 92 06/10/2019  ? TRIG 161 (H) 06/10/2019  ?On Lipitor 20. ? ?Last eye exam: 10/15/2020: No DR, but hypertensive retinopathy - Dr. Marica Otter. ? ?She also has a history of HTN, asthma, vitamin D deficiency. ?She also has hair loss and sees Dr. Lois Huxley, dermatologist at [facility].  She specializes in hair loss. ? ?ROS: ?+ see HPI ? ?I reviewed pt's medications, allergies, PMH, social hx, family hx, and changes were documented in the history of present illness. Otherwise, unchanged from my initial visit note. ? ?Past Medical History:  ?Diagnosis Date  ? Allergy   ? Alopecia   ? Asthma   ? Back pain   ? Blood transfusion without reported diagnosis   ? with hysterectomy   ? Diabetes ([place])   ? Diabetes mellitus ([place])   ? GERD (gastroesophageal reflux disease)   ? Glaucoma   ? forming but no treatment- close evaluation per eye md   ? Hyperlipidemia   ? Hypertension   ? Joint pain   ? Lactose intolerance   ? Obesity   ? Raynauds syndrome   ? Swelling   ? Tremor   ? Vitamin D deficiency   ? ?Past Surgical History:  ?Procedure Laterality Date  ? ABDOMINAL HYSTERECTOMY    ? BREAST BIOPSY    ? COLONOSCOPY    ? MYOMECTOMY    ? POLYPECTOMY    ? ?Social History  ? ?Socioeconomic History  ? Marital status: Single  ?  Spouse name: Not on file  ? Number of children: no  ?Social Needs  ?Occupational History  ? Occupation: [employer]  ?  Employer: [employer]  ?Tobacco Use  ? Smoking status: Never Smoker  ? Smokeless tobacco: Never Used  ?Substance and Sexual Activity  ? Alcohol use:  Wine, 2-3 times a year, 1 drink  ? Drug use: No  ? ?Current Outpatient Medications on File Prior to Visit  ?Medication Sig Dispense Refill  ? albuterol (VENTOLIN HFA) 108 (90 Base) MCG/ACT inhaler Inhale 2 puffs into the lungs every 6 (six) hours as needed for wheezing or shortness of breath. 1 Inhaler 0  ?  albuterol (VENTOLIN HFA) 108 (90 Base) MCG/ACT inhaler Inhale 1 puff into the lungs every 4 (four) hours as needed 18 g 0  ? Alcohol Swabs (ALCOHOL WIPES) 70 % PADS 30 Packages by Does not apply route 2 (two) times daily. 100 each 0  ? amLODipine (NORVASC) 5 MG tablet Take 1 tablet (5 mg total) by mouth daily in the morning. 90 tablet 2  ? atorvastatin (LIPITOR) 20 MG tablet TAKE 1 TABLET BY MOUTH ONCE DAILY 90 tablet 1  ? atorvastatin (LIPITOR) 20 MG tablet Take 1 tablet (20 mg total) by mouth daily. 90 tablet 0  ?  atorvastatin (LIPITOR) 20 MG tablet Take 1 tablet (20 mg total) by mouth daily. 90 tablet 2  ? benzonatate (TESSALON) 200 MG capsule Take 1 capsule by mouth 3 times a day as  needed for cough 30 capsule 0  ? Blood Glucose Monitoring Suppl (FREESTYLE FREEDOM LITE) w/Device KIT Use to check blood sugars 1 kit 0  ? Cholecalciferol (VITAMIN D-3) 5000 UNIT/ML LIQD 1 tablet    ? COVID-19 mRNA bivalent vaccine, Pfizer, (PFIZER COVID-19 VAC BIVALENT) injection Inject into the muscle. 0.3 mL 0  ? fluticasone (FLONASE) 50 MCG/ACT nasal spray Use 2 sprays in both nostrils daily. 16 g 0  ? glipiZIDE (GLUCOTROL) 5 MG tablet Take 1-2 tablets (5-10 mg total) by mouth daily before supper. 180 tablet 3  ? glucose blood (FREESTYLE LITE) test strip Use to check blood sugar 2 times daily 60 each 11  ? hydrochlorothiazide (HYDRODIURIL) 25 MG tablet Take 1 tablet (25 mg total) by mouth daily in the morning. 90 tablet 2  ? Lancets (FREESTYLE) lancets Use to check blood sugar 2 times a day. 200 each 12  ? losartan (COZAAR) 50 MG tablet Take 1 tablet (50 mg total) by mouth daily. 90 tablet 2  ? Melatonin 3 MG TABS Take 1 tablet by mouth at bedtime as needed.    ? metFORMIN (GLUCOPHAGE-XR) 500 MG 24 hr tablet Take 2 tablets (1,000 mg total) by mouth 2 (two) times daily with a meal. 360 tablet 3  ? montelukast (SINGULAIR) 10 MG tablet Take 1 tablet (10 mg total) by mouth daily in the evening. 90 tablet 2  ? omeprazole (PRILOSEC)  20 MG capsule Take 1 capsule (20 mg total) by mouth daily at least 30 minutes before morning meal. 90 capsule 2  ? Potassium Chloride ER 20 MEQ TBCR 1 tablet with food    ? potassium chloride SA (KLOR-CON) 20 MEQ t

## 2021-06-09 ENCOUNTER — Other Ambulatory Visit (HOSPITAL_COMMUNITY): Payer: Self-pay

## 2021-06-09 ENCOUNTER — Other Ambulatory Visit: Payer: Self-pay | Admitting: Internal Medicine

## 2021-06-09 MED ORDER — OZEMPIC (1 MG/DOSE) 4 MG/3ML ~~LOC~~ SOPN
1.0000 mg | PEN_INJECTOR | SUBCUTANEOUS | 1 refills | Status: DC
  Filled 2021-06-09: qty 9, 84d supply, fill #0
  Filled 2021-08-23: qty 9, 84d supply, fill #1

## 2021-06-16 DIAGNOSIS — E1169 Type 2 diabetes mellitus with other specified complication: Secondary | ICD-10-CM | POA: Diagnosis not present

## 2021-06-16 DIAGNOSIS — J4520 Mild intermittent asthma, uncomplicated: Secondary | ICD-10-CM | POA: Diagnosis not present

## 2021-06-16 DIAGNOSIS — I10 Essential (primary) hypertension: Secondary | ICD-10-CM | POA: Diagnosis not present

## 2021-06-16 DIAGNOSIS — Z6841 Body Mass Index (BMI) 40.0 and over, adult: Secondary | ICD-10-CM | POA: Diagnosis not present

## 2021-06-16 DIAGNOSIS — E041 Nontoxic single thyroid nodule: Secondary | ICD-10-CM | POA: Diagnosis not present

## 2021-06-16 DIAGNOSIS — E7800 Pure hypercholesterolemia, unspecified: Secondary | ICD-10-CM | POA: Diagnosis not present

## 2021-08-05 ENCOUNTER — Other Ambulatory Visit (HOSPITAL_COMMUNITY): Payer: Self-pay

## 2021-08-23 ENCOUNTER — Other Ambulatory Visit (HOSPITAL_COMMUNITY): Payer: Self-pay

## 2021-09-02 ENCOUNTER — Other Ambulatory Visit (HOSPITAL_COMMUNITY): Payer: Self-pay

## 2021-09-02 MED ORDER — PROPRANOLOL HCL 20 MG PO TABS
20.0000 mg | ORAL_TABLET | Freq: Every day | ORAL | 1 refills | Status: DC
  Filled 2021-09-02: qty 90, 90d supply, fill #0
  Filled 2021-12-01: qty 90, 90d supply, fill #1

## 2021-09-08 ENCOUNTER — Encounter (INDEPENDENT_AMBULATORY_CARE_PROVIDER_SITE_OTHER): Payer: Self-pay

## 2021-09-21 ENCOUNTER — Ambulatory Visit: Payer: 59 | Admitting: Internal Medicine

## 2021-09-21 ENCOUNTER — Encounter: Payer: Self-pay | Admitting: Internal Medicine

## 2021-09-21 VITALS — BP 118/76 | HR 89 | Ht 64.0 in | Wt 246.6 lb

## 2021-09-21 DIAGNOSIS — E1165 Type 2 diabetes mellitus with hyperglycemia: Secondary | ICD-10-CM

## 2021-09-21 DIAGNOSIS — E0590 Thyrotoxicosis, unspecified without thyrotoxic crisis or storm: Secondary | ICD-10-CM

## 2021-09-21 DIAGNOSIS — E042 Nontoxic multinodular goiter: Secondary | ICD-10-CM | POA: Diagnosis not present

## 2021-09-21 LAB — POCT GLYCOSYLATED HEMOGLOBIN (HGB A1C): Hemoglobin A1C: 6.7 % — AB (ref 4.0–5.6)

## 2021-09-21 NOTE — Patient Instructions (Addendum)
Please continue: - Metformin ER 1500 mg with dinner - Glipizide 5 mg before a larger dinner - Farxiga 5 mg before b'fast - Ozempic 1 mg weekly  Please return in 4 months with your sugar log.  

## 2021-09-21 NOTE — Progress Notes (Signed)
Patient ID: Veronica Russell, female   DOB: 08-31-1960, 61 y.o.   MRN: [id number]   HPI  Veronica Russell is a 61 y.o.-year-old female, returning for follow-up for h/o subclinical hyperthyroidism, right thyroid nodule, and DM2, non-insulin-dependent.  Last visit 4 months ago.  Interim history: No increased urination, blurry vision, nausea, chest pain.   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 She seldom has regular soda and sweet tea anymore. She has a new grandbaby -almost 11 month old.  Subclinical hyperthyroidism:  Patient was found to have a slightly low TSH at her first visit in the weight loss clinic in 01/2016. Her TFTs normalized after 2018.  Reviewed her TFTs: 12/22/2020: TSH 0.65 Lab Results  Component Value Date   TSH 0.52 05/22/2020   TSH 0.57 09/27/2019   TSH 0.63 09/20/2018   TSH 0.50 10/05/2017   TSH 0.52 04/04/2017   TSH 0.436 (L) 09/29/2016   TSH 0.443 (L) 05/09/2016   TSH 0.425 (L) 01/27/2016   FREET4 0.83 05/22/2020   FREET4 0.94 09/27/2019   FREET4 0.80 09/20/2018   FREET4 0.75 10/05/2017   FREET4 0.75 04/04/2017   FREET4 1.17 09/29/2016   FREET4 1.24 05/09/2016   FREET4 1.17 01/27/2016    Lab Results  Component Value Date   T3FREE 3.7 05/22/2020   T3FREE 4.2 09/27/2019   T3FREE 3.4 09/20/2018   T3FREE 3.9 10/05/2017   T3FREE 3.7 04/04/2017    She has chronic: Hot flashes Palpitations with exertion Hair loss Hereditary tremors  Pt does have a FH of thyroid ds. - M aunt. No FH of thyroid cancer. No h/o radiation tx to head or neck. No herbal supplements. No Biotin use. No recent steroids use.   Right thyroid nodule:  Reviewed previous investigation: 04/14/2017: Thyroid ultrasound: 2.9 x 2 x 2.7 solid, hypoechoic, right thyroid nodule 05/11/2017: FNA of the dominant thyroid nodule: Benign 06/05/2019: Thyroid ultrasound: Stable nodules 01/08/2021: Thyroid ultrasound: Stable nodules, slightly enlarged left neck lymph  node  Pt denies: - feeling nodules in neck - hoarseness - dysphagia - choking  DM2:  Reviewed HbA1c levels: Lab Results  Component Value Date   HGBA1C 7.4 (A) 05/25/2021   HGBA1C 7.6 (A) 01/20/2021   HGBA1C 6.6 (A) 09/25/2020   HGBA1C 7.1 (A) 05/22/2020   HGBA1C 7.3 (A) 01/17/2020   HGBA1C 6.8 (A) 09/27/2019   HGBA1C 7.3 (A) 05/23/2019   HGBA1C 7.6 (H) 02/12/2019   HGBA1C 7.1 (A) 01/21/2019   HGBA1C 7.1 (A) 09/20/2018   She is on: - Metformin ER 1000 mg with dinner >> 1500 mg daily with dinner - Farxiga 5 mg before b'fast -  >> Ozempic 1 mg weekly - Glipizide 5 mg after dinner! >> Moved to before dinner - occasionally, before larger meals  She checks her sugars twice a day : - am:124-159 >> 148-162, 182, 337 >> 115,137-159 >> 122-143 - 2h after b'fast: n/c - lunch: n/c >> 139 >> n/c - 2h after lunch: n/c - dinner: 103-163, 187 >> 110-152 >> 104-112 >> 94 >> n/c - 2h after dinner: 120-168,273 >> 127-253 >> 136-175, 232 >> 112-158, 164 - bedtime: n/c Lowest: 101 >> 110 >> 104 >> 94 >> 127 >> 115 >> 112; hypoglycemia awareness in the 70s. Highest: 390 (40 mg Prednisone) >> ... 273 (fried rice, cake) >> 337 >> 232 (pizza) >> 164. Meter: Accu-Chek  No CKD: 06/16/2021: Gucose 111, BUN/creatinine 15/0.77, GFR 87 12/22/2020: Glucose 111, BUN/creatinine 15/0.68, GFR 99  12/16/2019: Glucose 122, BUN/creatinine 14/0.74, GFR 97 Lab Results  Component Value Date   BUN 11 06/10/2019   Lab Results  Component Value Date   CREATININE 0.75 06/10/2019  On losartan.  + HL: 06/16/2021: 168/143/50/93 12/22/2020: 179/152/57/95 12/16/2019:176/141/50/101 Lab Results  Component Value Date   CHOL 173 06/10/2019   HDL 53 06/10/2019   LDLCALC 92 06/10/2019   TRIG 161 (H) 06/10/2019  On Lipitor 20.  Last eye exam: 10/15/2020: No DR, but hypertensive retinopathy - Dr. Marica Otter.  She also has a history of HTN, asthma, vitamin D deficiency. Also, tremors >> had to decrease  Propranolol dose 2/2 wheezing. She also has hair loss and sees Dr. Lois Huxley, dermatologist at [facility].  She specializes in hair loss.  Last TSH: TSH 0.34 (0.34-4.5)  ROS: + see HPI  I reviewed pt's medications, allergies, PMH, social hx, family hx, and changes were documented in the history of present illness. Otherwise, unchanged from my initial visit note.  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- close evaluation per eye md    Hyperlipidemia    Hypertension    Joint pain    Lactose intolerance    Obesity    Raynauds syndrome    Swelling    Tremor    Vitamin D deficiency    Past Surgical History:  Procedure Laterality Date   ABDOMINAL HYSTERECTOMY     BREAST BIOPSY     COLONOSCOPY     MYOMECTOMY     POLYPECTOMY     Social History   Socioeconomic History   Marital status: Single    Spouse name: Not on file   Number of children: no  Social Needs  Occupational History   Occupation: Corporate treasurer    Employer: [employer]  Tobacco Use   Smoking status: Never Smoker   Smokeless tobacco: Never Used  Substance and Sexual Activity   Alcohol use:  Wine, 2-3 times a year, 1 drink   Drug use: No   Current Outpatient Medications on File Prior to Visit  Medication Sig Dispense Refill   albuterol (VENTOLIN HFA) 108 (90 Base) MCG/ACT inhaler Inhale 2 puffs into the lungs every 6 (six) hours as needed for wheezing or shortness of breath. 1 Inhaler 0   albuterol (VENTOLIN HFA) 108 (90 Base) MCG/ACT inhaler Inhale 1 puff into the lungs every 4 (four) hours as needed 18 g 0   Alcohol Swabs (ALCOHOL WIPES) 70 % PADS 30 Packages by Does not apply route 2 (two) times daily. 100 each 0   amLODipine (NORVASC) 5 MG tablet Take 1  tablet (5 mg total) by mouth daily in the morning. 90 tablet 2   atorvastatin (LIPITOR) 20 MG tablet Take 1 tablet (20 mg total) by mouth daily. 90 tablet 2   benzonatate (TESSALON) 200 MG capsule Take 1 capsule by mouth 3 times a day as  needed for cough 30 capsule 0   Blood Glucose Monitoring Suppl (FREESTYLE FREEDOM LITE) w/Device KIT Use to check blood sugars 1 kit 0   Cholecalciferol (VITAMIN D-3) 5000 UNIT/ML LIQD 1 tablet     COVID-19 mRNA bivalent vaccine, Pfizer, (PFIZER COVID-19 VAC  BIVALENT) injection Inject into the muscle. 0.3 mL 0   dapagliflozin propanediol (FARXIGA) 5 MG TABS tablet Take 1 tablet (5 mg total) by mouth daily before breakfast. 90 tablet 3   fluticasone (FLONASE) 50 MCG/ACT nasal spray Use 2 sprays in both nostrils daily. 16 g 0   glipiZIDE (GLUCOTROL) 5 MG tablet Take 1-2 tablets (5-10 mg total) by mouth daily before supper. 180 tablet 3   glucose blood (FREESTYLE LITE) test strip Use to check blood sugar 2 times daily 60 each 11   hydrochlorothiazide (HYDRODIURIL) 25 MG tablet Take 1 tablet (25 mg total) by mouth daily in the morning. 90 tablet 2   Lancets (FREESTYLE) lancets Use to check blood sugar 2 times a day. 200 each 12   losartan (COZAAR) 50 MG tablet Take 1 tablet (50 mg total) by mouth daily. 90 tablet 2   Melatonin 3 MG TABS Take 1 tablet by mouth at bedtime as needed.     metFORMIN (GLUCOPHAGE-XR) 500 MG 24 hr tablet Take 2 tablets (1,000 mg total) by mouth 2 (two) times daily with a meal. 360 tablet 3   montelukast (SINGULAIR) 10 MG tablet Take 1 tablet (10 mg total) by mouth daily in the evening. 90 tablet 2   omeprazole (PRILOSEC) 20 MG capsule Take 1 capsule (20 mg total) by mouth daily at least 30 minutes before morning meal. 90 capsule 2   Potassium Chloride ER 20 MEQ TBCR 1 tablet with food     potassium chloride SA (KLOR-CON) 20 MEQ tablet TAKE 1 TABLET BY MOUTH ONCE DAILY WITH FOOD (Patient taking differently: Take by mouth daily. with food) 90  tablet 2   propranolol (INDERAL) 20 MG tablet Take 1 tablet (20 mg total) by mouth daily. 90 tablet 1   Semaglutide, 1 MG/DOSE, (OZEMPIC, 1 MG/DOSE,) 4 MG/3ML SOPN Inject 1 mg into the skin once a week. 9 mL 1   No current facility-administered medications on file prior to visit.   Allergies  Allergen Reactions   Erythromycin Nausea Only   Milk-Related Compounds Other (See Comments)    Lactose intollerance   Tetanus-Diphth-Acell Pertussis Hives   Family History  Problem Relation Age of Onset   Multiple myeloma Mother    Hypertension Mother    Hyperlipidemia Mother    Cancer Mother        multiple Myeloma in remission    Obesity Mother    Hypertension Father    Hyperlipidemia Father    Cancer Father    Prostate cancer Father    Asthma Other    Hyperlipidemia Other    Hypertension Other    Cancer Other    COPD Other    Stroke Other    Breast cancer Maternal Aunt    Breast cancer Paternal Aunt    Breast cancer Maternal Aunt    Colon cancer Brother 45   Colon polyps Brother    Esophageal cancer Neg Hx    Rectal cancer Neg Hx    Stomach cancer Neg Hx     PE: BP 118/76 (BP Location: Right Arm, Patient Position: Sitting, Cuff Size: Normal)   Pulse 89   Ht 5' 4"  (1.626 m)   Wt 246 lb 9.6 oz (111.9 kg)   LMP 08/31/2008   SpO2 97%   BMI 42.33 kg/m  Wt Readings from Last 3 Encounters:  09/21/21 246 lb 9.6 oz (111.9 kg)  05/25/21 255 lb 9.6 oz (115.9 kg)  01/20/21 261 lb 9.6 oz (118.7 kg)  Constitutional: overweight, in NAD Eyes:EOMI, no exophthalmos ENT: moist mucous membranes, no thyromegaly, no cervical lymphadenopathy Cardiovascular: RRR, No MRG Respiratory: CTA B Musculoskeletal: no deformities, Skin: moist, warm, no rashes Neurological: + tremor with outstretched hands Diabetic Foot Exam - Simple   Simple Foot Form Diabetic Foot exam was performed with the following findings: Yes 09/21/2021  9:37 AM  Visual Inspection No deformities, no ulcerations, no  other skin breakdown bilaterally: Yes Sensation Testing Intact to touch and monofilament testing bilaterally: Yes Pulse Check Posterior Tibialis and Dorsalis pulse intact bilaterally: Yes Comments     ASSESSMENT: 1.  Mild subclinical thyrotoxicosis  2.  Multiple thyroid nodules Thyroid U/S (04/14/2017): FINDINGS: Parenchymal Echotexture: Mildly heterogenous Isthmus: 0.5 cm Right lobe: 4.5 x 2.1 x 1.3 cm Left lobe: 4.3 x 1.4 x 1.6 cm _________________________________________________________  Nodule # 1: Location: Right; Inferior Maximum size: 2.9 cm; Other 2 dimensions: 2.0 x 2.1 cm Composition: solid/almost completely solid (2) Echogenicity: hypoechoic (2) Shape: not taller-than-wide (0) Margins: smooth (0) Echogenic foci: none (0) ACR TI-RADS total points: 4. ACR TI-RADS risk category: TR4 (4-6 points).  ACR TI-RADS recommendations: **Given size (>/= 1.5 cm) and appearance, fine needle aspiration of this moderately suspicious nodule should be considered based on TI-RADS criteria.________________________________________________  Multiple other smaller nodules measure 0.7 cm or less in size and do not meet criteria for biopsy nor follow-up.  IMPRESSION: Right lower pole nodule 1 meets criteria for fine needle aspiration biopsy.   FNA: Adequacy Reason Satisfactory For Evaluation. Diagnosis THYROID, FINE NEEDLE ASPIRATION, RLP (SPECIMEN 1 OF 1,COLLECTED 05/11/17): CONSISTENT WITH BENIGN FOLLICULAR NODULE ([place] CATEGORY II). Enid Cutter MD Pathologist, Electronic Signature (Case signed 05/15/2017) Specimen Clinical Information Right Inferior, 2.9cm; Other 2 dimensions: 2.0 x 2.1cm, solid/almost completely solid, hypoechoic, TI-RADS total points - 4, Moderately suspicious nodule Source Thyroid, Fine Needle Aspiration, Right, RLP (Specimen 1 of 1, collected on 05/11/17)  Thyroid U/S (06/05/2019): CLINICAL DATA:  Goiter. 61 year old female with a history of  thyroid nodules. The 2.9 cm right inferior thyroid nodule was previously biopsied on 05/11/2017   Parenchymal Echotexture: Mildly heterogenous Isthmus: 0.4 cm Right lobe: 4.9 x 2.2 x 1.5 cm Left lobe: 4.0 x 1.6 x 1.3 cm _________________________________________________________   The previously biopsied nodule in the right inferior gland is unchanged at 2.9 x 2.2 x 2.1 cm. Additional small subcentimeter thyroid nodules again noted scattered throughout the left gland. None of these nodules demonstrates significant interval growth or change in morphology. These lesions do not meet criteria for biopsy or dedicated imaging surveillance.   IMPRESSION: No significant interval change in the size or appearance of the previously biopsied 2.9 cm nodule in the right inferior gland.   No new or suspicious thyroid nodules identified.  Thyroid U/S (01/08/2021):  Parenchymal Echotexture: Mildly heterogeneous                 Isthmus: 0.4 cm                 Right lobe: 5.1 x 1.8 x 2.1 cm                 Left lobe: 4.7 x 1.1 x 1.4 cm                 _________________________________________________________                 Estimated total number of nodules >/= 1 cm: 1  Number of spongiform nodules >/= 2 cm not described below (TR1): 0                 Number of mixed cystic and solid nodules >/= 1.5 cm not described        below (TR2): 0                 _________________________________________________________                 The previously biopsied 2.5 cm solid hypoechoic right inferior        thyroid nodule is unchanged in size from prior examination.                 Subcentimeter left thyroid nodule does not meet criteria for imaging        follow-up or FNA.                 Prominent left neck lymph node measuring 2.5 x 1.1 x 0.9 cm is not        enlarged by imaging.                 IMPRESSION:        1. Previously biopsied dominant right thyroid nodule is not         significantly changed in size. Please correlate with prior FNA        results.        2. Prominent left neck lymph node is not enlarged by imaging        criteria. It is likely reactive, however continued clinical        surveillance is recommended. If there is evidence of enlargement        over time, repeat imaging should be considered.     3. DM2, non-insulin-dependent  PLAN:  1. Patient with with history of subclinical hyperthyroidism with possible thyrotoxic symptoms: Hot flashes (but she is also postmenopausal), palpitations, hair loss.  These are all chronic. -TSH was normal at last check in 12/2020: 0.65  2.  Multiple thyroid nodules -She denies neck compression symptoms -She has a solid, right, 2.9 cm thyroid nodule visible on the ultrasound from 03/2017.  Biopsy of this nodule in 05/2017 was benign.  Repeat thyroid ultrasound in 06/2019 showed that the nodule was stable.  She also had several smaller nodules, not worrisome. She had another neck ultrasound checked by PCP in the [facility] system (01/08/2021).  I was able to review the report, but not the images.  The renal nodule, measuring 2.5 cm, was stable. She had a slightly enlarged left cervical lymph node, likely reactive -Plan to repeat another ultrasound at next visit, approximately 1 year from the previous  3. DM2 -Patient with history of uncontrolled diabetes, with improved control especially after switching from Victoza to [place].  However, at last visit, sugars were mostly above goal in the morning and occasionally higher than goal later in the day, at bedtime.  She described more stress at work and more dietary indiscretions.  She was able to increase the dose of metformin but only to 1500 mg daily.  She could not tolerate 2000 mg daily due to abdominal pain and diarrhea.  At last visit I also suggested to add an SGLT2 inhibitor.  I advised her to stay hydrated and discussed about benefits and possible side effects.  At that  time, HbA1c was slightly lower, at 7.4%. -At today's visit, sugars improved significantly after adding [place].  They are  now almost all at goal.  She tolerates it well.  No increased urination.  She feels more thirsty, we discussed about staying more hydrated.  Otherwise, we can continue the same regimen. - I suggested to: Patient Instructions  Please continue: - Metformin ER 1500 mg with dinner - Glipizide 5 mg before dinner - Farxiga 5 mg before b'fast - Ozempic 1 mg weekly  Please return in 4 months with your sugar log.   - we checked her HbA1c: 6.7% (much better) - advised to check sugars at different times of the day - 1x a day, rotating check times - advised for yearly eye exams >> she is UTD - return to clinic in 4 months   Philemon Kingdom, MD PhD [facility]

## 2021-09-29 ENCOUNTER — Other Ambulatory Visit (HOSPITAL_COMMUNITY): Payer: Self-pay

## 2021-10-12 ENCOUNTER — Other Ambulatory Visit: Payer: Self-pay | Admitting: Family Medicine

## 2021-10-12 DIAGNOSIS — Z1231 Encounter for screening mammogram for malignant neoplasm of breast: Secondary | ICD-10-CM

## 2021-10-21 ENCOUNTER — Other Ambulatory Visit (HOSPITAL_BASED_OUTPATIENT_CLINIC_OR_DEPARTMENT_OTHER): Payer: Self-pay

## 2021-10-21 ENCOUNTER — Other Ambulatory Visit: Payer: Self-pay | Admitting: Family Medicine

## 2021-10-21 DIAGNOSIS — U071 COVID-19: Secondary | ICD-10-CM

## 2021-10-21 MED ORDER — BENZONATATE 200 MG PO CAPS
200.0000 mg | ORAL_CAPSULE | Freq: Three times a day (TID) | ORAL | 0 refills | Status: DC
  Filled 2021-10-21: qty 30, 10d supply, fill #0

## 2021-10-21 MED ORDER — MOLNUPIRAVIR EUA 200MG CAPSULE
4.0000 | ORAL_CAPSULE | Freq: Two times a day (BID) | ORAL | 0 refills | Status: AC
Start: 2021-10-21 — End: 2021-10-26
  Filled 2021-10-21: qty 40, 5d supply, fill #0

## 2021-10-28 ENCOUNTER — Ambulatory Visit: Payer: 59

## 2021-10-28 ENCOUNTER — Other Ambulatory Visit (HOSPITAL_COMMUNITY): Payer: Self-pay

## 2021-10-28 DIAGNOSIS — R053 Chronic cough: Secondary | ICD-10-CM | POA: Diagnosis not present

## 2021-10-28 DIAGNOSIS — J4520 Mild intermittent asthma, uncomplicated: Secondary | ICD-10-CM | POA: Diagnosis not present

## 2021-10-28 MED ORDER — PREDNISONE 10 MG (21) PO TBPK
ORAL_TABLET | ORAL | 0 refills | Status: AC
  Filled 2021-10-28: qty 21, 6d supply, fill #0

## 2021-10-28 MED ORDER — ALBUTEROL SULFATE HFA 108 (90 BASE) MCG/ACT IN AERS
1.0000 | INHALATION_SPRAY | RESPIRATORY_TRACT | 0 refills | Status: DC | PRN
  Filled 2021-10-28: qty 6.7, 34d supply, fill #0

## 2021-11-02 ENCOUNTER — Ambulatory Visit: Payer: 59

## 2021-11-16 ENCOUNTER — Other Ambulatory Visit (HOSPITAL_COMMUNITY): Payer: Self-pay

## 2021-11-16 ENCOUNTER — Other Ambulatory Visit: Payer: Self-pay | Admitting: Internal Medicine

## 2021-11-16 MED ORDER — OZEMPIC (1 MG/DOSE) 4 MG/3ML ~~LOC~~ SOPN
1.0000 mg | PEN_INJECTOR | SUBCUTANEOUS | 1 refills | Status: DC
  Filled 2021-11-16: qty 9, 84d supply, fill #0
  Filled 2022-02-14 (×2): qty 9, 84d supply, fill #1

## 2021-12-01 ENCOUNTER — Other Ambulatory Visit (HOSPITAL_COMMUNITY): Payer: Self-pay

## 2021-12-01 ENCOUNTER — Ambulatory Visit
Admission: RE | Admit: 2021-12-01 | Discharge: 2021-12-01 | Disposition: A | Discharge status: Home / Self Care | Payer: 59 | Source: Ambulatory Visit | Attending: Family Medicine | Admitting: Family Medicine

## 2021-12-01 DIAGNOSIS — Z1231 Encounter for screening mammogram for malignant neoplasm of breast: Secondary | ICD-10-CM

## 2021-12-01 MED ORDER — HYDROCHLOROTHIAZIDE 25 MG PO TABS
25.0000 mg | ORAL_TABLET | Freq: Every morning | ORAL | 0 refills | Status: DC
  Filled 2021-12-01: qty 90, 90d supply, fill #0

## 2021-12-01 MED ORDER — AMLODIPINE BESYLATE 5 MG PO TABS
5.0000 mg | ORAL_TABLET | Freq: Every day | ORAL | 0 refills | Status: DC
  Filled 2021-12-01: qty 90, 90d supply, fill #0

## 2021-12-01 MED ORDER — MONTELUKAST SODIUM 10 MG PO TABS
10.0000 mg | ORAL_TABLET | Freq: Every day | ORAL | 0 refills | Status: DC
  Filled 2021-12-01: qty 90, 90d supply, fill #0

## 2021-12-01 MED ORDER — OMEPRAZOLE 20 MG PO CPDR
20.0000 mg | DELAYED_RELEASE_CAPSULE | Freq: Every morning | ORAL | 0 refills | Status: DC
  Filled 2021-12-01: qty 90, 90d supply, fill #0

## 2021-12-02 ENCOUNTER — Other Ambulatory Visit (HOSPITAL_COMMUNITY): Payer: Self-pay

## 2021-12-02 MED ORDER — LOSARTAN POTASSIUM 50 MG PO TABS
50.0000 mg | ORAL_TABLET | Freq: Every day | ORAL | 0 refills | Status: DC
  Filled 2021-12-02: qty 90, 90d supply, fill #0

## 2021-12-28 DIAGNOSIS — E119 Type 2 diabetes mellitus without complications: Secondary | ICD-10-CM | POA: Diagnosis not present

## 2021-12-28 DIAGNOSIS — H524 Presbyopia: Secondary | ICD-10-CM | POA: Diagnosis not present

## 2021-12-28 DIAGNOSIS — H43813 Vitreous degeneration, bilateral: Secondary | ICD-10-CM | POA: Diagnosis not present

## 2021-12-28 DIAGNOSIS — H53143 Visual discomfort, bilateral: Secondary | ICD-10-CM | POA: Diagnosis not present

## 2021-12-28 DIAGNOSIS — H2513 Age-related nuclear cataract, bilateral: Secondary | ICD-10-CM | POA: Diagnosis not present

## 2021-12-28 DIAGNOSIS — H52223 Regular astigmatism, bilateral: Secondary | ICD-10-CM | POA: Diagnosis not present

## 2021-12-28 DIAGNOSIS — H40013 Open angle with borderline findings, low risk, bilateral: Secondary | ICD-10-CM | POA: Diagnosis not present

## 2021-12-28 DIAGNOSIS — H5213 Myopia, bilateral: Secondary | ICD-10-CM | POA: Diagnosis not present

## 2021-12-29 ENCOUNTER — Other Ambulatory Visit (HOSPITAL_COMMUNITY): Payer: Self-pay

## 2021-12-29 DIAGNOSIS — E041 Nontoxic single thyroid nodule: Secondary | ICD-10-CM | POA: Diagnosis not present

## 2021-12-29 DIAGNOSIS — Z6841 Body Mass Index (BMI) 40.0 and over, adult: Secondary | ICD-10-CM | POA: Diagnosis not present

## 2021-12-29 DIAGNOSIS — E7800 Pure hypercholesterolemia, unspecified: Secondary | ICD-10-CM | POA: Diagnosis not present

## 2021-12-29 DIAGNOSIS — E119 Type 2 diabetes mellitus without complications: Secondary | ICD-10-CM | POA: Diagnosis not present

## 2021-12-29 DIAGNOSIS — Z0000 Encounter for general adult medical examination without abnormal findings: Secondary | ICD-10-CM | POA: Diagnosis not present

## 2021-12-29 DIAGNOSIS — I10 Essential (primary) hypertension: Secondary | ICD-10-CM | POA: Diagnosis not present

## 2021-12-29 DIAGNOSIS — M545 Low back pain, unspecified: Secondary | ICD-10-CM | POA: Diagnosis not present

## 2021-12-29 DIAGNOSIS — E559 Vitamin D deficiency, unspecified: Secondary | ICD-10-CM | POA: Diagnosis not present

## 2021-12-29 DIAGNOSIS — J4520 Mild intermittent asthma, uncomplicated: Secondary | ICD-10-CM | POA: Diagnosis not present

## 2021-12-29 LAB — MICROALBUMIN / CREATININE URINE RATIO
Creatinine, POC: 86 mg/dL
Microalb Creat Ratio: 22.4
Microalbumin, Urine: 1.92

## 2021-12-29 MED ORDER — NITROFURANTOIN MONOHYD MACRO 100 MG PO CAPS
100.0000 mg | ORAL_CAPSULE | Freq: Two times a day (BID) | ORAL | 0 refills | Status: DC
  Filled 2021-12-29: qty 14, 7d supply, fill #0

## 2022-01-21 ENCOUNTER — Encounter: Payer: Self-pay | Admitting: Internal Medicine

## 2022-01-21 ENCOUNTER — Ambulatory Visit: Payer: 59 | Admitting: Internal Medicine

## 2022-01-21 VITALS — BP 120/62 | HR 101 | Ht 64.0 in | Wt 242.0 lb

## 2022-01-21 DIAGNOSIS — E042 Nontoxic multinodular goiter: Secondary | ICD-10-CM

## 2022-01-21 DIAGNOSIS — E1165 Type 2 diabetes mellitus with hyperglycemia: Secondary | ICD-10-CM | POA: Diagnosis not present

## 2022-01-21 DIAGNOSIS — E0590 Thyrotoxicosis, unspecified without thyrotoxic crisis or storm: Secondary | ICD-10-CM

## 2022-01-21 LAB — POCT GLYCOSYLATED HEMOGLOBIN (HGB A1C): Hemoglobin A1C: 6.4 % — AB (ref 4.0–5.6)

## 2022-01-21 NOTE — Progress Notes (Addendum)
Patient ID: KENYON ESHLEMAN, female   DOB: 1960/07/09, 61 y.o.   MRN: [id number]   HPI  MAZI SCHUFF is a 61 y.o.-year-old female, returning for follow-up for h/o subclinical hyperthyroidism, right thyroid nodule, and DM2, non-insulin-dependent.  Last visit 4 months ago.  Interim history: No increased urination, blurry vision, nausea, chest pain.    Subclinical hyperthyroidism:  Patient was found to have a slightly low TSH at her first visit in the weight loss clinic in 01/2016. Her TFTs normalized after 2018.  Reviewed her TFTs: 12/29/2021: TSH 0.63 12/22/2020: TSH 0.65 Lab Results  Component Value Date   TSH 0.52 05/22/2020   TSH 0.57 09/27/2019   TSH 0.63 09/20/2018   TSH 0.50 10/05/2017   TSH 0.52 04/04/2017   TSH 0.436 (L) 09/29/2016   TSH 0.443 (L) 05/09/2016   TSH 0.425 (L) 01/27/2016   FREET4 0.83 05/22/2020   FREET4 0.94 09/27/2019   FREET4 0.80 09/20/2018   FREET4 0.75 10/05/2017   FREET4 0.75 04/04/2017   FREET4 1.17 09/29/2016   FREET4 1.24 05/09/2016   FREET4 1.17 01/27/2016    Lab Results  Component Value Date   T3FREE 3.7 05/22/2020   T3FREE 4.2 09/27/2019   T3FREE 3.4 09/20/2018   T3FREE 3.9 10/05/2017   T3FREE 3.7 04/04/2017    She has chronic: Hot flashes Palpitations with exertion Hair loss Hereditary tremors  Pt does have a FH of thyroid ds. - M aunt. No FH of thyroid cancer. No h/o radiation tx to head or neck. No herbal supplements. No Biotin use. No recent steroids use.   Right thyroid nodule:  Reviewed previous investigation: 04/14/2017: Thyroid ultrasound: 2.9 x 2 x 2.7 solid, hypoechoic, right thyroid nodule 05/11/2017: FNA of the dominant thyroid nodule: Benign 06/05/2019: Thyroid ultrasound: Stable nodules 01/08/2021: Thyroid ultrasound: Stable nodules, slightly enlarged left neck lymph node  Pt denies: - feeling nodules in neck - hoarseness - dysphagia - choking  DM2:  Reviewed HbA1c levels: Lab Results  Component Value  Date   HGBA1C 6.7 (A) 09/21/2021   HGBA1C 7.4 (A) 05/25/2021   HGBA1C 7.6 (A) 01/20/2021   HGBA1C 6.6 (A) 09/25/2020   HGBA1C 7.1 (A) 05/22/2020   HGBA1C 7.3 (A) 01/17/2020   HGBA1C 6.8 (A) 09/27/2019   HGBA1C 7.3 (A) 05/23/2019   HGBA1C 7.6 (H) 02/12/2019   HGBA1C 7.1 (A) 01/21/2019   She is on: - Metformin ER 1000 mg with dinner >> 1500 mg daily with dinner - Farxiga 5 mg before b'fast -  >> Ozempic 1 mg weekly - Glipizide 5 mg after dinner! >> Moved to before dinner - occasionally, before larger meals  She checks her sugars twice a day : - am: 148-162, 182, 337 >> 115,137-159 >> 122-143 >> 114-126 - 2h after b'fast: n/c - lunch: n/c >> 139 >> n/c - 2h after lunch: n/c - dinner: 103-163, 187 >> 110-152 >> 104-112 >> 94 >> n/c - 2h after dinner: 1136-175, 232 >> 112-158, 164 >> 130-150s - bedtime: n/c Lowest: 101 >> 110 >> 104 >> 94 >> 127 >> 115 >> 112; hypoglycemia awareness in the 70s. Highest: 390 (40 mg Prednisone) >> ... 273 (fried rice, cake) >> 337 >> 232 (pizza) >> 164. Meter: Accu-Chek  No CKD: 12/29/2021: Glucose 97, BUN/creatinine 20/0.82, GFR 81, ACR 22.4 06/16/2021: Gucose 111, BUN/creatinine 15/0.77, GFR 87 12/22/2020: Glucose 111, BUN/creatinine 15/0.68, GFR 99 12/16/2019: Glucose 122, BUN/creatinine 14/0.74, GFR 97 Lab Results  Component Value Date   BUN 11 06/10/2019  Lab Results  Component Value Date   CREATININE 0.75 06/10/2019  On losartan.  + HL: 12/29/2021:170/136/51/95 06/16/2021: 168/143/50/93 12/22/2020: 179/152/57/95 12/16/2019:176/141/50/101 Lab Results  Component Value Date   CHOL 173 06/10/2019   HDL 53 06/10/2019   LDLCALC 92 06/10/2019   TRIG 161 (H) 06/10/2019  On Lipitor 20.  Last eye exam: 11/28/.2023: No DR reportedly, but hypertensive retinopathy - Dr. Marica Otter.  Last foot exam 09/21/2021.  She also has a history of HTN, asthma, vitamin D deficiency. Also, tremors >> had to decrease Propranolol dose 2/2  wheezing. She also has hair loss and sees Dr. Lois Huxley, dermatologist at [facility].  She specializes in hair loss.  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  ROS: + see HPI  I reviewed pt's medications, allergies, PMH, social hx, family hx, and changes were documented in the history of present illness. Otherwise, unchanged from my initial visit note.  Past Medical History:  Diagnosis Date   Allergy    Alopecia    Asthma    Back pain    Blood transfusion without reported diagnosis    with hysterectomy    Diabetes ([place])    Diabetes mellitus ([place])    GERD (gastroesophageal reflux disease)    Glaucoma    forming but no treatment- close evaluation per eye md    Hyperlipidemia    Hypertension    Joint pain    Lactose intolerance    Obesity    Raynauds syndrome    Swelling    Tremor    Vitamin D deficiency    Past Surgical History:  Procedure Laterality Date   ABDOMINAL HYSTERECTOMY     BREAST BIOPSY     COLONOSCOPY     MYOMECTOMY     POLYPECTOMY     Social History   Socioeconomic History   Marital status: Single    Spouse name: Not on file   Number of children: no  Social Needs  Occupational History   Occupation: Corporate treasurer    Employer: [employer]  Tobacco Use   Smoking status: Never Smoker   Smokeless tobacco: Never Used  Substance and Sexual Activity   Alcohol use:  Wine, 2-3 times a year, 1 drink   Drug use: No   Current Outpatient Medications on File Prior to Visit  Medication Sig Dispense Refill   albuterol (VENTOLIN HFA) 108 (90 Base) MCG/ACT inhaler Inhale 2 puffs into the lungs every 6 (six) hours as needed for wheezing or shortness of breath. 1 Inhaler 0   albuterol (VENTOLIN HFA) 108 (90 Base) MCG/ACT inhaler Inhale 1 puff into the lungs every 4 (four) hours as needed 18 g 0   albuterol (VENTOLIN HFA) 108 (90 Base) MCG/ACT inhaler Inhale 1 puff into the lungs every 4 (four) hours as needed. 6.7 g 0   Alcohol Swabs  (ALCOHOL WIPES) 70 % PADS 30 Packages by Does not apply route 2 (two) times daily. 100 each 0   amLODipine (NORVASC) 5 MG tablet Take 1 tablet (5 mg total) by mouth daily in the morning. 90 tablet 2   amLODipine (NORVASC) 5 MG tablet Take 1 tablet (5 mg total) by mouth daily. 90 tablet 0   atorvastatin (LIPITOR) 20 MG tablet Take 1 tablet (20 mg total) by mouth daily. 90 tablet 2   benzonatate (TESSALON) 200 MG capsule Take 1 capsule by mouth 3 times a day as  needed for cough 30 capsule 0   Blood Glucose  Monitoring Suppl (FREESTYLE FREEDOM LITE) w/Device KIT Use to check blood sugars 1 kit 0   Cholecalciferol (VITAMIN D-3) 5000 UNIT/ML LIQD 1 tablet     COVID-19 mRNA bivalent vaccine, Pfizer, (PFIZER COVID-19 VAC BIVALENT) injection Inject into the muscle. 0.3 mL 0   dapagliflozin propanediol (FARXIGA) 5 MG TABS tablet Take 1 tablet (5 mg total) by mouth daily before breakfast. 90 tablet 3   fluticasone (FLONASE) 50 MCG/ACT nasal spray Use 2 sprays in both nostrils daily. 16 g 0   glipiZIDE (GLUCOTROL) 5 MG tablet Take 1-2 tablets (5-10 mg total) by mouth daily before supper. 180 tablet 3   glucose blood (FREESTYLE LITE) test strip Use to check blood sugar 2 times daily 60 each 11   hydrochlorothiazide (HYDRODIURIL) 25 MG tablet Take 1 tablet (25 mg total) by mouth in the morning. 90 tablet 0   Lancets (FREESTYLE) lancets Use to check blood sugar 2 times a day. 200 each 12   losartan (COZAAR) 50 MG tablet Take 1 tablet (50 mg total) by mouth daily. 90 tablet 0   Melatonin 3 MG TABS Take 1 tablet by mouth at bedtime as needed.     metFORMIN (GLUCOPHAGE-XR) 500 MG 24 hr tablet Take 2 tablets (1,000 mg total) by mouth 2 (two) times daily with a meal. 360 tablet 3   montelukast (SINGULAIR) 10 MG tablet Take 1 tablet (10 mg total) by mouth daily in the evening. 90 tablet 2   montelukast (SINGULAIR) 10 MG tablet Take 1 tablet (10 mg total) by mouth daily. 90 tablet 0   nitrofurantoin,  macrocrystal-monohydrate, (MACROBID) 100 MG capsule Take 1 capsule (100 mg total) by mouth with food every 12 (twelve) hours for 7 days 14 capsule 0   omeprazole (PRILOSEC) 20 MG capsule Take 1 capsule (20 mg total) by mouth daily at least 30 minutes before morning meal. 90 capsule 2   omeprazole (PRILOSEC) 20 MG capsule Take 1 capsule (20 mg total) by mouth 30 minutes before morning meal 90 capsule 0   Potassium Chloride ER 20 MEQ TBCR 1 tablet with food     potassium chloride SA (KLOR-CON) 20 MEQ tablet TAKE 1 TABLET BY MOUTH ONCE DAILY WITH FOOD (Patient taking differently: Take by mouth daily. with food) 90 tablet 2   propranolol (INDERAL) 20 MG tablet Take 1 tablet (20 mg total) by mouth daily. 90 tablet 1   Semaglutide, 1 MG/DOSE, (OZEMPIC, 1 MG/DOSE,) 4 MG/3ML SOPN Inject 1 mg into the skin once a week. 9 mL 1   No current facility-administered medications on file prior to visit.   Allergies  Allergen Reactions   Erythromycin Nausea Only   Milk-Related Compounds Other (See Comments)    Lactose intollerance   Tetanus-Diphth-Acell Pertussis Hives   Family History  Problem Relation Age of Onset   Multiple myeloma Mother    Hypertension Mother    Hyperlipidemia Mother    Cancer Mother        multiple Myeloma in remission    Obesity Mother    Hypertension Father    Hyperlipidemia Father    Cancer Father    Prostate cancer Father    Asthma Other    Hyperlipidemia Other    Hypertension Other    Cancer Other    COPD Other    Stroke Other    Breast cancer Maternal Aunt    Breast cancer Paternal Aunt    Breast cancer Maternal Aunt    Colon cancer Brother 59   Colon  polyps Brother    Esophageal cancer Neg Hx    Rectal cancer Neg Hx    Stomach cancer Neg Hx     PE: LMP 08/31/2008  Wt Readings from Last 3 Encounters:  09/21/21 246 lb 9.6 oz (111.9 kg)  05/25/21 255 lb 9.6 oz (115.9 kg)  01/20/21 261 lb 9.6 oz (118.7 kg)   Constitutional: overweight, in NAD Eyes:EOMI, no  exophthalmos ENT: no thyromegaly, no cervical lymphadenopathy Cardiovascular: RRR, No MRG Respiratory: CTA B Musculoskeletal: no deformities, Skin:  no rashes Neurological: + tremor with outstretched hands   ASSESSMENT: 1.  Mild subclinical thyrotoxicosis  2.  Multiple thyroid nodules Thyroid U/S (04/14/2017): FINDINGS: Parenchymal Echotexture: Mildly heterogenous Isthmus: 0.5 cm Right lobe: 4.5 x 2.1 x 1.3 cm Left lobe: 4.3 x 1.4 x 1.6 cm _________________________________________________________  Nodule # 1: Location: Right; Inferior Maximum size: 2.9 cm; Other 2 dimensions: 2.0 x 2.1 cm Composition: solid/almost completely solid (2) Echogenicity: hypoechoic (2) Shape: not taller-than-wide (0) Margins: smooth (0) Echogenic foci: none (0) ACR TI-RADS total points: 4. ACR TI-RADS risk category: TR4 (4-6 points).  ACR TI-RADS recommendations: **Given size (>/= 1.5 cm) and appearance, fine needle aspiration of this moderately suspicious nodule should be considered based on TI-RADS criteria.________________________________________________  Multiple other smaller nodules measure 0.7 cm or less in size and do not meet criteria for biopsy nor follow-up.  IMPRESSION: Right lower pole nodule 1 meets criteria for fine needle aspiration biopsy.   FNA: Adequacy Reason Satisfactory For Evaluation. Diagnosis THYROID, FINE NEEDLE ASPIRATION, RLP (SPECIMEN 1 OF 1,COLLECTED 05/11/17): CONSISTENT WITH BENIGN FOLLICULAR NODULE ([place] CATEGORY II). Enid Cutter MD Pathologist, Electronic Signature (Case signed 05/15/2017) Specimen Clinical Information Right Inferior, 2.9cm; Other 2 dimensions: 2.0 x 2.1cm, solid/almost completely solid, hypoechoic, TI-RADS total points - 4, Moderately suspicious nodule Source Thyroid, Fine Needle Aspiration, Right, RLP (Specimen 1 of 1, collected on 05/11/17)  Thyroid U/S (06/05/2019): CLINICAL DATA:  Goiter. 61 year old female with a history  of thyroid nodules. The 2.9 cm right inferior thyroid nodule was previously biopsied on 05/11/2017   Parenchymal Echotexture: Mildly heterogenous Isthmus: 0.4 cm Right lobe: 4.9 x 2.2 x 1.5 cm Left lobe: 4.0 x 1.6 x 1.3 cm _________________________________________________________   The previously biopsied nodule in the right inferior gland is unchanged at 2.9 x 2.2 x 2.1 cm. Additional small subcentimeter thyroid nodules again noted scattered throughout the left gland. None of these nodules demonstrates significant interval growth or change in morphology. These lesions do not meet criteria for biopsy or dedicated imaging surveillance.   IMPRESSION: No significant interval change in the size or appearance of the previously biopsied 2.9 cm nodule in the right inferior gland.   No new or suspicious thyroid nodules identified.  Thyroid U/S (01/08/2021):  Parenchymal Echotexture: Mildly heterogeneous                 Isthmus: 0.4 cm                 Right lobe: 5.1 x 1.8 x 2.1 cm                 Left lobe: 4.7 x 1.1 x 1.4 cm                 _________________________________________________________                 Estimated total number of nodules >/= 1 cm: 1  Number of spongiform nodules >/= 2 cm not described below (TR1): 0                 Number of mixed cystic and solid nodules >/= 1.5 cm not described        below (TR2): 0                 _________________________________________________________                 The previously biopsied 2.5 cm solid hypoechoic right inferior        thyroid nodule is unchanged in size from prior examination.                 Subcentimeter left thyroid nodule does not meet criteria for imaging        follow-up or FNA.                 Prominent left neck lymph node measuring 2.5 x 1.1 x 0.9 cm is not        enlarged by imaging.                 IMPRESSION:        1. Previously biopsied dominant right thyroid nodule is not         significantly changed in size. Please correlate with prior FNA        results.        2. Prominent left neck lymph node is not enlarged by imaging        criteria. It is likely reactive, however continued clinical        surveillance is recommended. If there is evidence of enlargement        over time, repeat imaging should be considered.     3. DM2, non-insulin-dependent  PLAN:  1. Patient with with history of subclinical hyperthyroidism with possible thyrotoxic symptoms: Hot flashes (but she is also postmenopausal), palpitations, hair loss.  These are all chronic. -TSH was normal at last check in 12/2021 -per records brought by patient.  2.  Multiple thyroid nodules -no neck compression symptoms -She has a solid, right, 2.9 cm nodule per the review of the ultrasound from 03/2017.  We biopsied this nodule in 05/2017 with benign results.  Repeat thyroid ultrasound in 06/2019 shows that the nodule was stable.  She also had several smaller nodules, not worrisome.  She had another neck ultrasound checked by PCP in 12/2020 (in the [facility] system).  I was able to review the report, but not the images.  The right nodule, measuring now 2.5 cm appears to be stable.  She had a slightly enlarged left cervical lymph node, likely reactive. -Plan to repeat another ultrasound now.  We discussed that if the lymph node appears to be decreased in size and if the thyroid nodules are still stable, no further investigation is needed, since we completed 5 years of follow-up.  3. DM2 -Patient with history of uncontrolled diabetes, with improved control especially after switching from Victoza to [place].  She could not tolerate the higher dose of metformin due to diarrhea.  After addition of an SGLT2 inhibitor, sugars improved significantly and at last visit they were almost all at goal.  We discussed about staying hydrated but we did not change the regimen at last visit.  HbA1c at that time was 6.7%, improved. -At  today's visit, sugars are at goal, without hypo or hyperglycemic exceptions.  She tolerates her medications well.  She  did recently have a UTI, which is treated with antibiotics by PCP.  Reviewed the urinalysis.  She had positive urinary glucose, which we discussed is good to [place]. - I suggested to: Patient Instructions  Please continue: - Metformin ER 1500 mg with dinner - Glipizide 5 mg before dinner - Farxiga 5 mg before b'fast - Ozempic 1 mg weekly  Please return in 4 months with your sugar log.   - we checked her HbA1c: 6.4% (lower) - advised to check sugars at different times of the day - 1x a day, rotating check times - advised for yearly eye exams >> she is UTD - return to clinic in 4 months  Orders Placed This Encounter  Procedures   US THYROID   POCT glycosylated hemoglobin (Hb A1C)   Thyroid U/S (02/25/2022): Parenchymal Echotexture: Mildly heterogenous  Isthmus: 0.5 cm  Right lobe: 0.1 cm x 2.7 cm x 1.3 cm  Left lobe: 4.5 cm x 1.4 cm x 1.4 cm  _________________________________________________________   Estimated total number of nodules >/= 1 cm: 1 _________________________________________________________   Nodule labeled 1 inferior right thyroid, 2.9 cm, unchanged in size. Nodule was previously biopsied. Assuming benign result, no further specific follow-up would be indicated.   Nodules labeled 2, 3, 4 in the left thyroid are unchanged, none of which meet criteria for continued surveillance.   No adenopathy   Recommendations follow those established by the new ACR TI-RADS criteria (J Am Coll Radiol 2017;14:587-595).   IMPRESSION: Unchanged appearance over time of heterogeneous multinodular thyroid as above.  Philemon Kingdom, MD PhD [facility]

## 2022-01-21 NOTE — Patient Instructions (Addendum)
Please continue: - Metformin ER 1500 mg with dinner - Glipizide 5 mg before a larger dinner - Farxiga 5 mg before b'fast - Ozempic 1 mg weekly  Please return in 4 months with your sugar log.

## 2022-02-08 ENCOUNTER — Other Ambulatory Visit: Payer: Commercial Managed Care - PPO

## 2022-02-14 ENCOUNTER — Other Ambulatory Visit (HOSPITAL_COMMUNITY): Payer: Self-pay

## 2022-02-14 ENCOUNTER — Other Ambulatory Visit: Payer: Self-pay

## 2022-02-14 ENCOUNTER — Other Ambulatory Visit: Payer: Self-pay | Admitting: Internal Medicine

## 2022-02-14 MED ORDER — METFORMIN HCL ER 500 MG PO TB24
1000.0000 mg | ORAL_TABLET | Freq: Two times a day (BID) | ORAL | 3 refills | Status: DC
  Filled 2022-02-14: qty 360, 90d supply, fill #0
  Filled 2022-05-17: qty 360, 90d supply, fill #1
  Filled 2022-08-24: qty 360, 90d supply, fill #2
  Filled 2022-11-24: qty 360, 90d supply, fill #3

## 2022-02-15 ENCOUNTER — Other Ambulatory Visit: Payer: Self-pay

## 2022-02-16 ENCOUNTER — Other Ambulatory Visit (HOSPITAL_COMMUNITY): Payer: Self-pay

## 2022-02-23 ENCOUNTER — Ambulatory Visit
Admission: RE | Admit: 2022-02-23 | Discharge: 2022-02-23 | Disposition: A | Discharge status: Home / Self Care | Payer: Commercial Managed Care - PPO | Source: Ambulatory Visit | Attending: Internal Medicine | Admitting: Internal Medicine

## 2022-02-23 DIAGNOSIS — E041 Nontoxic single thyroid nodule: Secondary | ICD-10-CM | POA: Diagnosis not present

## 2022-02-23 DIAGNOSIS — E042 Nontoxic multinodular goiter: Secondary | ICD-10-CM

## 2022-02-27 ENCOUNTER — Other Ambulatory Visit (HOSPITAL_COMMUNITY): Payer: Self-pay

## 2022-02-28 ENCOUNTER — Other Ambulatory Visit (HOSPITAL_COMMUNITY): Payer: Self-pay

## 2022-02-28 MED ORDER — OMEPRAZOLE 20 MG PO CPDR
DELAYED_RELEASE_CAPSULE | ORAL | 2 refills | Status: DC
  Filled 2022-02-28: qty 90, 90d supply, fill #0
  Filled 2022-05-25: qty 90, 90d supply, fill #1
  Filled 2022-08-24: qty 90, 90d supply, fill #2

## 2022-02-28 MED ORDER — AMLODIPINE BESYLATE 5 MG PO TABS
ORAL_TABLET | ORAL | 2 refills | Status: DC
  Filled 2022-02-28: qty 90, 90d supply, fill #0
  Filled 2022-05-25: qty 90, 90d supply, fill #1
  Filled 2022-08-24: qty 90, 90d supply, fill #2

## 2022-02-28 MED ORDER — PROPRANOLOL HCL 20 MG PO TABS
20.0000 mg | ORAL_TABLET | Freq: Every day | ORAL | 1 refills | Status: DC
  Filled 2022-02-28: qty 90, 90d supply, fill #0
  Filled 2022-05-25: qty 90, 90d supply, fill #1

## 2022-02-28 MED ORDER — HYDROCHLOROTHIAZIDE 25 MG PO TABS
ORAL_TABLET | ORAL | 2 refills | Status: DC
  Filled 2022-02-28: qty 90, 90d supply, fill #0
  Filled 2022-05-25: qty 90, 90d supply, fill #1
  Filled 2022-08-24: qty 90, 90d supply, fill #2

## 2022-02-28 MED ORDER — LOSARTAN POTASSIUM 50 MG PO TABS
ORAL_TABLET | ORAL | 2 refills | Status: DC
  Filled 2022-02-28: qty 90, 90d supply, fill #0
  Filled 2022-05-25: qty 90, 90d supply, fill #1
  Filled 2022-08-24: qty 90, 90d supply, fill #2

## 2022-02-28 MED ORDER — ATORVASTATIN CALCIUM 20 MG PO TABS
20.0000 mg | ORAL_TABLET | Freq: Every day | ORAL | 2 refills | Status: DC
  Filled 2022-02-28: qty 90, 90d supply, fill #0
  Filled 2022-05-25: qty 90, 90d supply, fill #1
  Filled 2022-08-25: qty 90, 90d supply, fill #2

## 2022-02-28 MED ORDER — MONTELUKAST SODIUM 10 MG PO TABS
ORAL_TABLET | ORAL | 3 refills | Status: DC
  Filled 2022-02-28: qty 90, 90d supply, fill #0
  Filled 2022-05-25: qty 90, 90d supply, fill #1
  Filled 2022-08-24: qty 90, 90d supply, fill #2
  Filled 2022-11-24: qty 90, 90d supply, fill #3

## 2022-04-20 ENCOUNTER — Other Ambulatory Visit: Payer: Self-pay | Admitting: Internal Medicine

## 2022-04-20 ENCOUNTER — Other Ambulatory Visit (HOSPITAL_COMMUNITY): Payer: Self-pay

## 2022-04-20 MED ORDER — FREESTYLE LITE TEST VI STRP
1.0000 | ORAL_STRIP | Freq: Two times a day (BID) | 11 refills | Status: DC
  Filled 2022-04-20: qty 50, 25d supply, fill #0
  Filled 2022-05-09 – 2022-05-17 (×2): qty 100, 50d supply, fill #1
  Filled 2022-07-20: qty 100, 50d supply, fill #2
  Filled 2022-09-20: qty 100, 50d supply, fill #3
  Filled 2022-11-24: qty 100, 50d supply, fill #4
  Filled 2023-02-01: qty 100, 50d supply, fill #5
  Filled 2023-04-03: qty 100, 50d supply, fill #6

## 2022-05-07 ENCOUNTER — Other Ambulatory Visit: Payer: Self-pay | Admitting: Internal Medicine

## 2022-05-08 MED ORDER — OZEMPIC (1 MG/DOSE) 4 MG/3ML ~~LOC~~ SOPN
1.0000 mg | PEN_INJECTOR | SUBCUTANEOUS | 1 refills | Status: DC
  Filled 2022-05-08 – 2022-05-09 (×2): qty 9, 84d supply, fill #0

## 2022-05-09 ENCOUNTER — Other Ambulatory Visit (HOSPITAL_COMMUNITY): Payer: Self-pay

## 2022-05-09 ENCOUNTER — Other Ambulatory Visit: Payer: Self-pay

## 2022-05-17 ENCOUNTER — Other Ambulatory Visit (HOSPITAL_COMMUNITY): Payer: Self-pay

## 2022-05-19 ENCOUNTER — Other Ambulatory Visit (HOSPITAL_COMMUNITY): Payer: Self-pay

## 2022-05-25 ENCOUNTER — Other Ambulatory Visit: Payer: Self-pay

## 2022-05-25 ENCOUNTER — Other Ambulatory Visit: Payer: Self-pay | Admitting: Internal Medicine

## 2022-05-26 ENCOUNTER — Other Ambulatory Visit (HOSPITAL_COMMUNITY): Payer: Self-pay

## 2022-05-26 MED ORDER — DAPAGLIFLOZIN PROPANEDIOL 5 MG PO TABS
5.0000 mg | ORAL_TABLET | Freq: Every day | ORAL | 0 refills | Status: DC
  Filled 2022-05-26: qty 90, 90d supply, fill #0

## 2022-05-27 ENCOUNTER — Encounter: Payer: Self-pay | Admitting: Internal Medicine

## 2022-05-27 ENCOUNTER — Other Ambulatory Visit (HOSPITAL_COMMUNITY): Payer: Self-pay

## 2022-05-27 ENCOUNTER — Ambulatory Visit: Payer: Commercial Managed Care - PPO | Admitting: Internal Medicine

## 2022-05-27 VITALS — BP 130/88 | HR 97 | Ht 64.0 in | Wt 237.8 lb

## 2022-05-27 DIAGNOSIS — Z7985 Long-term (current) use of injectable non-insulin antidiabetic drugs: Secondary | ICD-10-CM

## 2022-05-27 DIAGNOSIS — E042 Nontoxic multinodular goiter: Secondary | ICD-10-CM | POA: Diagnosis not present

## 2022-05-27 DIAGNOSIS — E1165 Type 2 diabetes mellitus with hyperglycemia: Secondary | ICD-10-CM | POA: Diagnosis not present

## 2022-05-27 DIAGNOSIS — Z7984 Long term (current) use of oral hypoglycemic drugs: Secondary | ICD-10-CM

## 2022-05-27 DIAGNOSIS — E0590 Thyrotoxicosis, unspecified without thyrotoxic crisis or storm: Secondary | ICD-10-CM | POA: Diagnosis not present

## 2022-05-27 LAB — POCT GLYCOSYLATED HEMOGLOBIN (HGB A1C): Hemoglobin A1C: 6.6 % — AB (ref 4.0–5.6)

## 2022-05-27 MED ORDER — EMPAGLIFLOZIN 10 MG PO TABS: 10.0000 mg | ORAL_TABLET | Freq: Every day | ORAL | 3 refills | Status: DC

## 2022-05-27 MED ORDER — DAPAGLIFLOZIN PROPANEDIOL 5 MG PO TABS
5.0000 mg | ORAL_TABLET | Freq: Every day | ORAL | 3 refills | Status: DC
  Filled 2022-05-27: qty 90, 90d supply, fill #0
  Filled 2022-08-26: qty 90, 90d supply, fill #1
  Filled 2022-11-24: qty 90, 90d supply, fill #2

## 2022-05-27 MED ORDER — OZEMPIC (1 MG/DOSE) 4 MG/3ML ~~LOC~~ SOPN
1.0000 mg | PEN_INJECTOR | SUBCUTANEOUS | 3 refills | Status: DC
  Filled 2022-05-27 – 2022-08-05 (×3): qty 9, 84d supply, fill #0
  Filled 2022-11-01: qty 9, 84d supply, fill #1
  Filled 2023-02-01: qty 9, 84d supply, fill #2
  Filled 2023-05-02: qty 9, 84d supply, fill #3

## 2022-05-27 NOTE — Progress Notes (Signed)
Patient ID: MENUCHA DICESARE, female   DOB: 05-17-1960, 62 y.o.   MRN: [id number]   HPI  YAZMEEN WOOLF is a 62 y.o.-year-old female, returning for follow-up for h/o subclinical hyperthyroidism, right thyroid nodule, and DM2, non-insulin-dependent.  Last visit 4 months ago.  Interim history: No increased urination, blurry vision, nausea, chest pain.   She just moved >> did not check sugars frequently.  Subclinical hyperthyroidism:  Patient was found to have a slightly low TSH at her first visit in the weight loss clinic in 01/2016. Her TFTs normalized after 2018.  Reviewed her TFTs: 12/29/2021: TSH 0.63 12/22/2020: TSH 0.65 Lab Results  Component Value Date   TSH 0.52 05/22/2020   TSH 0.57 09/27/2019   TSH 0.63 09/20/2018   TSH 0.50 10/05/2017   TSH 0.52 04/04/2017   TSH 0.436 (L) 09/29/2016   TSH 0.443 (L) 05/09/2016   TSH 0.425 (L) 01/27/2016   FREET4 0.83 05/22/2020   FREET4 0.94 09/27/2019   FREET4 0.80 09/20/2018   FREET4 0.75 10/05/2017   FREET4 0.75 04/04/2017   FREET4 1.17 09/29/2016   FREET4 1.24 05/09/2016   FREET4 1.17 01/27/2016    Lab Results  Component Value Date   T3FREE 3.7 05/22/2020   T3FREE 4.2 09/27/2019   T3FREE 3.4 09/20/2018   T3FREE 3.9 10/05/2017   T3FREE 3.7 04/04/2017    She has chronic: Hot flashes Palpitations with exertion Hair loss Hereditary tremors  Pt does have a FH of thyroid ds. - M aunt. No FH of thyroid cancer. No h/o radiation tx to head or neck. No herbal supplements. No Biotin use. No recent steroids use.   Right thyroid nodule:  Reviewed previous investigation: 04/14/2017: Thyroid ultrasound: 2.9 x 2 x 2.7 solid, hypoechoic, right thyroid nodule 05/11/2017: FNA of the dominant thyroid nodule: Benign 06/05/2019: Thyroid ultrasound: Stable nodules 01/08/2021: Thyroid ultrasound: Stable nodules, slightly enlarged left neck lymph node  02/25/2022: Thyroid ultrasound: Stable nodule, no follow-up needed  Pt denies: - feeling  nodules in neck - hoarseness - dysphagia - choking  DM2:  Reviewed HbA1c levels: Lab Results  Component Value Date   HGBA1C 6.4 (A) 01/21/2022   HGBA1C 6.7 (A) 09/21/2021   HGBA1C 7.4 (A) 05/25/2021   HGBA1C 7.6 (A) 01/20/2021   HGBA1C 6.6 (A) 09/25/2020   HGBA1C 7.1 (A) 05/22/2020   HGBA1C 7.3 (A) 01/17/2020   HGBA1C 6.8 (A) 09/27/2019   HGBA1C 7.3 (A) 05/23/2019   HGBA1C 7.6 (H) 02/12/2019   She is on: - Metformin ER 1000 mg with dinner >> 1500 mg daily with dinner - Farxiga 5 mg before b'fast -  >> Ozempic 1 mg weekly - Glipizide 5 mg after dinner! >> Moved to before dinner - occasionally, before larger meals  She checks her sugars twice a day : - am: 148-162, 182, 337 >> 115,137-159 >> 122-143 >> 114-126 >> 120-130s - 2h after b'fast: n/c - lunch: n/c >> 139 >> n/c - 2h after lunch: n/c - dinner: 103-163, 187 >> 110-152 >> 104-112 >> 94 >> n/c - 2h after dinner: 136-175, 232 >> 112-158, 164 >> 130-150s >> 130-160s, 187 - bedtime: n/c Lowest: 94 >> 127 >> 115 >> 112 >> 111; hypoglycemia awareness in the 70s. Highest: 337 >> 232 (pizza) >> 164 >> 187. Meter: Accu-Chek  No CKD: 12/29/2021: Glucose 97, BUN/creatinine 20/0.82, GFR 81, ACR 22.4 06/16/2021: Gucose 111, BUN/creatinine 15/0.77, GFR 87 12/22/2020: Glucose 111, BUN/creatinine 15/0.68, GFR 99 12/16/2019: Glucose 122, BUN/creatinine 14/0.74, GFR 97 Lab Results  Component Value Date   BUN 11 06/10/2019   Lab Results  Component Value Date   CREATININE 0.75 06/10/2019  On losartan.  + HL: 12/29/2021:170/136/51/95 06/16/2021: 168/143/50/93 12/22/2020: 179/152/57/95 12/16/2019:176/141/50/101 Lab Results  Component Value Date   CHOL 173 06/10/2019   HDL 53 06/10/2019   LDLCALC 92 06/10/2019   TRIG 161 (H) 06/10/2019  On Lipitor 20.  Last eye exam: 12/28/2021: No DR reportedly, but hypertensive retinopathy - Dr. Blima Ledger.  Last foot exam 09/21/2021.  She also has a history of HTN, asthma,  vitamin D deficiency. Also, tremors >> had to decrease Propranolol dose 2/2 wheezing. She also has hair loss and sees Dr. Isaac Laud, dermatologist at [facility].  She specializes in hair loss.  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  ROS: + see HPI  I reviewed pt's medications, allergies, PMH, social hx, family hx, and changes were documented in the history of present illness. Otherwise, unchanged from my initial visit note.  Past Medical History:  Diagnosis Date   Allergy    Alopecia    Asthma    Back pain    Blood transfusion without reported diagnosis    with hysterectomy    Diabetes (HCC)    Diabetes mellitus (HCC)    GERD (gastroesophageal reflux disease)    Glaucoma    forming but no treatment- close evaluation per eye md    Hyperlipidemia    Hypertension    Joint pain    Lactose intolerance    Obesity    Raynauds syndrome    Swelling    Tremor    Vitamin D deficiency    Past Surgical History:  Procedure Laterality Date   ABDOMINAL HYSTERECTOMY     BREAST BIOPSY     COLONOSCOPY     MYOMECTOMY     POLYPECTOMY     Social History   Socioeconomic History   Marital status: Single    Spouse name: Not on file   Number of children: no  Social Needs  Occupational History   Occupation: Public relations account executive    Employer: [employer]  Tobacco Use   Smoking status: Never Smoker   Smokeless tobacco: Never Used  Substance and Sexual Activity   Alcohol use:  Wine, 2-3 times a year, 1 drink   Drug use: No   Current Outpatient Medications on File Prior to Visit  Medication Sig Dispense Refill   albuterol (VENTOLIN HFA) 108 (90 Base) MCG/ACT inhaler Inhale 2 puffs into the lungs every 6 (six) hours as needed for wheezing or shortness of breath. 1 Inhaler 0   albuterol (VENTOLIN HFA) 108 (90 Base) MCG/ACT inhaler Inhale 1 puff into the lungs every 4 (four) hours as needed 18 g 0   albuterol (VENTOLIN HFA) 108 (90 Base) MCG/ACT inhaler Inhale 1 puff  into the lungs every 4 (four) hours as needed. 6.7 g 0   Alcohol Swabs (ALCOHOL WIPES) 70 % PADS 30 Packages by Does not apply route 2 (two) times daily. 100 each 0   amLODipine (NORVASC) 5 MG tablet Take 1 tablet (5 mg total) by mouth daily in the morning. 90 tablet 2   amLODipine (NORVASC) 5 MG tablet take 1 tablet by mouth once daily 90 tablet 2   atorvastatin (LIPITOR) 20 MG tablet Take 1 tablet (20 mg total) by mouth daily. 90 tablet 2   benzonatate (TESSALON) 200 MG capsule Take 1 capsule by mouth 3 times a day as  needed for  cough 30 capsule 0   Blood Glucose Monitoring Suppl (FREESTYLE FREEDOM LITE) w/Device KIT Use to check blood sugars 1 kit 0   Cholecalciferol (VITAMIN D-3) 5000 UNIT/ML LIQD 1 tablet     COVID-19 mRNA bivalent vaccine, Pfizer, (PFIZER COVID-19 VAC BIVALENT) injection Inject into the muscle. 0.3 mL 0   dapagliflozin propanediol (FARXIGA) 5 MG TABS tablet Take 1 tablet (5 mg total) by mouth daily before breakfast. 90 tablet 0   fluticasone (FLONASE) 50 MCG/ACT nasal spray Use 2 sprays in both nostrils daily. 16 g 0   glipiZIDE (GLUCOTROL) 5 MG tablet Take 1-2 tablets (5-10 mg total) by mouth daily before supper. 180 tablet 3   glucose blood (FREESTYLE LITE) test strip Use to check blood sugar 2 times daily 60 each 11   hydrochlorothiazide (HYDRODIURIL) 25 MG tablet take 1 tablet once a day in the morning 90 tablet 2   Lancets (FREESTYLE) lancets Use to check blood sugar 2 times a day. 200 each 12   losartan (COZAAR) 50 MG tablet take 1 tablet by mouth Once a day 90 tablet 2   Melatonin 3 MG TABS Take 1 tablet by mouth at bedtime as needed.     metFORMIN (GLUCOPHAGE-XR) 500 MG 24 hr tablet Take 2 tablets (1,000 mg total) by mouth 2 (two) times daily with a meal. 360 tablet 3   montelukast (SINGULAIR) 10 MG tablet Take 1 tablet (10 mg total) by mouth daily in the evening. 90 tablet 2   montelukast (SINGULAIR) 10 MG tablet take 1 tablet by mouth Once a day 90 tablet 3    nitrofurantoin, macrocrystal-monohydrate, (MACROBID) 100 MG capsule Take 1 capsule (100 mg total) by mouth with food every 12 (twelve) hours for 7 days 14 capsule 0   omeprazole (PRILOSEC) 20 MG capsule Take 1 capsule (20 mg total) by mouth daily at least 30 minutes before morning meal. 90 capsule 2   omeprazole (PRILOSEC) 20 MG capsule take 1 capsule 30 minutes before morning meal, Once a day 90 capsule 2   Potassium Chloride ER 20 MEQ TBCR 1 tablet with food     potassium chloride SA (KLOR-CON) 20 MEQ tablet TAKE 1 TABLET BY MOUTH ONCE DAILY WITH FOOD (Patient taking differently: Take by mouth daily. with food) 90 tablet 2   propranolol (INDERAL) 20 MG tablet Take 1 tablet (20 mg total) by mouth daily. 90 tablet 1   Semaglutide, 1 MG/DOSE, (OZEMPIC, 1 MG/DOSE,) 4 MG/3ML SOPN Inject 1 mg into the skin once a week. 9 mL 1   No current facility-administered medications on file prior to visit.   Allergies  Allergen Reactions   Erythromycin Nausea Only   Milk-Related Compounds Other (See Comments)    Lactose intollerance   Tetanus-Diphth-Acell Pertussis Hives   Family History  Problem Relation Age of Onset   Multiple myeloma Mother    Hypertension Mother    Hyperlipidemia Mother    Cancer Mother        multiple Myeloma in remission    Obesity Mother    Hypertension Father    Hyperlipidemia Father    Cancer Father    Prostate cancer Father    Asthma Other    Hyperlipidemia Other    Hypertension Other    Cancer Other    COPD Other    Stroke Other    Breast cancer Maternal Aunt    Breast cancer Paternal Aunt    Breast cancer Maternal Aunt    Colon cancer Brother 54  Colon polyps Brother    Esophageal cancer Neg Hx    Rectal cancer Neg Hx    Stomach cancer Neg Hx    PE: BP 130/88 (BP Location: Left Arm, Patient Position: Sitting, Cuff Size: Normal)   Pulse 97   Ht 5\' 4"  (1.626 m)   Wt 237 lb 12.8 oz (107.9 kg)   LMP 08/31/2008   SpO2 97%   BMI 40.82 kg/m  Wt Readings  from Last 3 Encounters:  05/27/22 237 lb 12.8 oz (107.9 kg)  01/21/22 242 lb (109.8 kg)  09/21/21 246 lb 9.6 oz (111.9 kg)   Constitutional: overweight, in NAD Eyes:EOMI, no exophthalmos ENT: no thyromegaly, no cervical lymphadenopathy Cardiovascular: RRR, No MRG Respiratory: CTA B Musculoskeletal: no deformities, Skin:  no rashes Neurological: + tremor with outstretched hands  ASSESSMENT: 1.  Mild subclinical thyrotoxicosis  2.  Multiple thyroid nodules Thyroid U/S (04/14/2017): FINDINGS: Parenchymal Echotexture: Mildly heterogenous Isthmus: 0.5 cm Right lobe: 4.5 x 2.1 x 1.3 cm Left lobe: 4.3 x 1.4 x 1.6 cm _________________________________________________________  Nodule # 1: Location: Right; Inferior Maximum size: 2.9 cm; Other 2 dimensions: 2.0 x 2.1 cm Composition: solid/almost completely solid (2) Echogenicity: hypoechoic (2) Shape: not taller-than-wide (0) Margins: smooth (0) Echogenic foci: none (0) ACR TI-RADS total points: 4. ACR TI-RADS risk category: TR4 (4-6 points).  ACR TI-RADS recommendations: **Given size (>/= 1.5 cm) and appearance, fine needle aspiration of this moderately suspicious nodule should be considered based on TI-RADS criteria.________________________________________________  Multiple other smaller nodules measure 0.7 cm or less in size and do not meet criteria for biopsy nor follow-up.  IMPRESSION: Right lower pole nodule 1 meets criteria for fine needle aspiration biopsy.   FNA: Adequacy Reason Satisfactory For Evaluation. Diagnosis THYROID, FINE NEEDLE ASPIRATION, RLP (SPECIMEN 1 OF 1,COLLECTED 05/11/17): CONSISTENT WITH BENIGN FOLLICULAR NODULE (BETHESDA CATEGORY II). Pecola Leisure MD Pathologist, Electronic Signature (Case signed 05/15/2017) Specimen Clinical Information Right Inferior, 2.9cm; Other 2 dimensions: 2.0 x 2.1cm, solid/almost completely solid, hypoechoic, TI-RADS total points - 4, Moderately suspicious  nodule Source Thyroid, Fine Needle Aspiration, Right, RLP (Specimen 1 of 1, collected on 05/11/17)  Thyroid U/S (06/05/2019): CLINICAL DATA:  Goiter. 62 year old female with a history of thyroid nodules. The 2.9 cm right inferior thyroid nodule was previously biopsied on 05/11/2017   Parenchymal Echotexture: Mildly heterogenous Isthmus: 0.4 cm Right lobe: 4.9 x 2.2 x 1.5 cm Left lobe: 4.0 x 1.6 x 1.3 cm _________________________________________________________   The previously biopsied nodule in the right inferior gland is unchanged at 2.9 x 2.2 x 2.1 cm. Additional small subcentimeter thyroid nodules again noted scattered throughout the left gland. None of these nodules demonstrates significant interval growth or change in morphology. These lesions do not meet criteria for biopsy or dedicated imaging surveillance.   IMPRESSION: No significant interval change in the size or appearance of the previously biopsied 2.9 cm nodule in the right inferior gland.   No new or suspicious thyroid nodules identified.  Thyroid U/S (01/08/2021):  Parenchymal Echotexture: Mildly heterogeneous                 Isthmus: 0.4 cm                 Right lobe: 5.1 x 1.8 x 2.1 cm                 Left lobe: 4.7 x 1.1 x 1.4 cm                 _________________________________________________________  Estimated total number of nodules >/= 1 cm: 1                 Number of spongiform nodules >/= 2 cm not described below (TR1): 0                 Number of mixed cystic and solid nodules >/= 1.5 cm not described        below (TR2): 0                 _________________________________________________________                 The previously biopsied 2.5 cm solid hypoechoic right inferior        thyroid nodule is unchanged in size from prior examination.                 Subcentimeter left thyroid nodule does not meet criteria for imaging        follow-up or FNA.                 Prominent  left neck lymph node measuring 2.5 x 1.1 x 0.9 cm is not        enlarged by imaging.                 IMPRESSION:        1. Previously biopsied dominant right thyroid nodule is not        significantly changed in size. Please correlate with prior FNA        results.        2. Prominent left neck lymph node is not enlarged by imaging        criteria. It is likely reactive, however continued clinical        surveillance is recommended. If there is evidence of enlargement        over time, repeat imaging should be considered.     Thyroid U/S (02/25/2022): Parenchymal Echotexture: Mildly heterogenous  Isthmus: 0.5 cm  Right lobe: 0.1 cm x 2.7 cm x 1.3 cm  Left lobe: 4.5 cm x 1.4 cm x 1.4 cm  _________________________________________________________   Estimated total number of nodules >/= 1 cm: 1 _________________________________________________________   Nodule labeled 1 inferior right thyroid, 2.9 cm, unchanged in size. Nodule was previously biopsied. Assuming benign result, no further specific follow-up would be indicated.   Nodules labeled 2, 3, 4 in the left thyroid are unchanged, none of which meet criteria for continued surveillance.   No adenopathy   Recommendations follow those established by the new ACR TI-RADS criteria (J Am Coll Radiol 2017;14:587-595).   IMPRESSION: Unchanged appearance over time of heterogeneous multinodular thyroid as above.  3. DM2, non-insulin-dependent  PLAN:  1. Patient with with history of subclinical hyperthyroidism with possible thyrotoxic symptoms: Hot flashes (but she is also postmenopausal), palpitations, hair loss.  These occur for her. -TSH was normal at last check in 12/2021  2.  Multiple thyroid nodules -No neck compression symptoms -She has a solid, right, 2.9 cm nodule per the review of the ultrasound from 03/2017.  We biopsied this nodule in 05/2017 with benign results.  Repeat thyroid ultrasound in 06/2019 shows that the nodule  was stable.  She also had several smaller nodules, not worrisome.  She had another neck ultrasound checked by PCP in 12/2020 (in the [place] system).  I was able to review the report, but not the images.  The right nodule, measuring now 2.5 cm appears to  be stable.  She had a slightly enlarged left cervical lymph node, likely reactive. -At last visit, we discussed about repeating another ultrasound and that if the lymph node appears to be decreased in size and if the thyroid nodules are still stable, no further investigation is needed, since we completed 5 years of follow-up.  She had a new ultrasound on 02/25/2022 and this did not show any lymphadenopathy.  The thyroid nodules were all stable and not meet criteria for continued surveillance or any other intervention.  3. DM2 -Patient with history of uncontrolled diabetes, with improved control especially after switching from Victoza to Ozempic.  She could not tolerate a higher dose of metformin due to diarrhea.  After addition of an SGLT2 inhibitor, sugars improved significantly and at last visit, HbA1c was excellent, at 6.4%.  We did not change her regimen. -Since visit, she lost 5 more pounds.  Despite increased stress with moving, sugars remain mostly at goal.  Will not change her regimen.  However, she mentions that Marcelline Deist is now expensive for her, after the change of insurance, and I gave her a written prescription for Jardiance to see if this has a better price. - I suggested to: Patient Instructions  Please continue: - Metformin ER 1500 mg with dinner - Glipizide 5 mg before a larger dinner - Farxiga 5/Jardiance 10 mg before b'fast - Ozempic 1 mg weekly  Please return in 4 months with your sugar log.   - we checked her HbA1c: 6.6% (slightly higher, but still at goal) - advised to check sugars at different times of the day - 1x a day, rotating check times - advised for yearly eye exams >> she is UTD - return to clinic in 4 months  Carlus Pavlov, MD PhD [facility] Endocrin[facility]

## 2022-05-27 NOTE — Patient Instructions (Addendum)
Please continue: - Metformin ER 1500 mg with dinner - Glipizide 5 mg before a larger dinner - Farxiga 5/Jardiance 10 mg before b'fast - Ozempic 1 mg weekly  Please return in 4 months with your sugar log.

## 2022-05-28 ENCOUNTER — Other Ambulatory Visit: Payer: Self-pay

## 2022-05-30 ENCOUNTER — Other Ambulatory Visit (HOSPITAL_COMMUNITY): Payer: Self-pay

## 2022-05-31 ENCOUNTER — Other Ambulatory Visit (HOSPITAL_COMMUNITY): Payer: Self-pay

## 2022-06-02 ENCOUNTER — Other Ambulatory Visit (HOSPITAL_COMMUNITY): Payer: Self-pay

## 2022-06-07 ENCOUNTER — Other Ambulatory Visit (HOSPITAL_COMMUNITY): Payer: Self-pay

## 2022-06-30 DIAGNOSIS — E1169 Type 2 diabetes mellitus with other specified complication: Secondary | ICD-10-CM | POA: Diagnosis not present

## 2022-06-30 DIAGNOSIS — E7800 Pure hypercholesterolemia, unspecified: Secondary | ICD-10-CM | POA: Diagnosis not present

## 2022-06-30 DIAGNOSIS — Z6841 Body Mass Index (BMI) 40.0 and over, adult: Secondary | ICD-10-CM | POA: Diagnosis not present

## 2022-06-30 DIAGNOSIS — I10 Essential (primary) hypertension: Secondary | ICD-10-CM | POA: Diagnosis not present

## 2022-08-05 ENCOUNTER — Other Ambulatory Visit (HOSPITAL_COMMUNITY): Payer: Self-pay

## 2022-08-15 ENCOUNTER — Other Ambulatory Visit (HOSPITAL_COMMUNITY): Payer: Self-pay

## 2022-08-15 MED ORDER — NYSTATIN 100000 UNIT/GM EX POWD
1.0000 | Freq: Two times a day (BID) | CUTANEOUS | 1 refills | Status: AC
  Filled 2022-08-15: qty 60, 10d supply, fill #0
  Filled 2022-08-25: qty 60, 10d supply, fill #1

## 2022-08-24 ENCOUNTER — Other Ambulatory Visit: Payer: Self-pay

## 2022-08-24 ENCOUNTER — Other Ambulatory Visit (HOSPITAL_COMMUNITY): Payer: Self-pay

## 2022-08-24 MED ORDER — PROPRANOLOL HCL 20 MG PO TABS
20.0000 mg | ORAL_TABLET | Freq: Every day | ORAL | 0 refills | Status: DC
  Filled 2022-08-24: qty 90, 90d supply, fill #0

## 2022-08-26 ENCOUNTER — Other Ambulatory Visit: Payer: Self-pay

## 2022-09-20 ENCOUNTER — Other Ambulatory Visit: Payer: Self-pay | Admitting: Family Medicine

## 2022-09-20 DIAGNOSIS — Z1231 Encounter for screening mammogram for malignant neoplasm of breast: Secondary | ICD-10-CM

## 2022-09-30 ENCOUNTER — Ambulatory Visit: Payer: Commercial Managed Care - PPO | Admitting: Internal Medicine

## 2022-09-30 ENCOUNTER — Encounter: Payer: Self-pay | Admitting: Internal Medicine

## 2022-09-30 VITALS — BP 122/80 | HR 65 | Ht 64.0 in | Wt 239.6 lb

## 2022-09-30 DIAGNOSIS — E042 Nontoxic multinodular goiter: Secondary | ICD-10-CM | POA: Diagnosis not present

## 2022-09-30 DIAGNOSIS — E1165 Type 2 diabetes mellitus with hyperglycemia: Secondary | ICD-10-CM

## 2022-09-30 DIAGNOSIS — Z7984 Long term (current) use of oral hypoglycemic drugs: Secondary | ICD-10-CM | POA: Diagnosis not present

## 2022-09-30 DIAGNOSIS — E0590 Thyrotoxicosis, unspecified without thyrotoxic crisis or storm: Secondary | ICD-10-CM | POA: Diagnosis not present

## 2022-09-30 DIAGNOSIS — Z7985 Long-term (current) use of injectable non-insulin antidiabetic drugs: Secondary | ICD-10-CM | POA: Diagnosis not present

## 2022-09-30 LAB — POCT GLYCOSYLATED HEMOGLOBIN (HGB A1C): Hemoglobin A1C: 6.6 % — AB (ref 4.0–5.6)

## 2022-09-30 NOTE — Addendum Note (Signed)
Addended by: Pollie Meyer on: 09/30/2022 11:47 AM   Modules accepted: Orders

## 2022-09-30 NOTE — Progress Notes (Signed)
Patient ID: Veronica Russell, female   DOB: December 20, 1960, 62 y.o.   MRN: [id number]   HPI  Veronica Russell is a 62 y.o.-year-old female, returning for follow-up for h/o subclinical hyperthyroidism, right thyroid nodule, and DM2, non-insulin-dependent.  Last visit 4 months ago.  Interim history: No increased urination, blurry vision, nausea, chest pain.  She does have hip pain. She lost her mom in 08/2022.  She was very busy and also traveling.  Subclinical hyperthyroidism:  Patient was found to have a slightly low TSH at her first visit in the weight loss clinic in 01/2016. Her TFTs normalized after 2018.  Reviewed her TFTs: 12/29/2021: TSH 0.63 12/22/2020: TSH 0.65 Lab Results  Component Value Date   TSH 0.52 05/22/2020   TSH 0.57 09/27/2019   TSH 0.63 09/20/2018   TSH 0.50 10/05/2017   TSH 0.52 04/04/2017   TSH 0.436 (L) 09/29/2016   TSH 0.443 (L) 05/09/2016   TSH 0.425 (L) 01/27/2016   FREET4 0.83 05/22/2020   FREET4 0.94 09/27/2019   FREET4 0.80 09/20/2018   FREET4 0.75 10/05/2017   FREET4 0.75 04/04/2017   FREET4 1.17 09/29/2016   FREET4 1.24 05/09/2016   FREET4 1.17 01/27/2016    Lab Results  Component Value Date   T3FREE 3.7 05/22/2020   T3FREE 4.2 09/27/2019   T3FREE 3.4 09/20/2018   T3FREE 3.9 10/05/2017   T3FREE 3.7 04/04/2017    She has chronic: Hot flashes Palpitations with exertion Hair loss Hereditary tremors  Pt does have a FH of thyroid ds. - M aunt. No FH of thyroid cancer. No h/o radiation tx to head or neck. No herbal supplements. No Biotin use. No recent steroids use.   Right thyroid nodule:  Reviewed previous investigation: 04/14/2017: Thyroid ultrasound: 2.9 x 2 x 2.7 solid, hypoechoic, right thyroid nodule 05/11/2017: FNA of the dominant thyroid nodule: Benign 06/05/2019: Thyroid ultrasound: Stable nodules 01/08/2021: Thyroid ultrasound: Stable nodules, slightly enlarged left neck lymph node  02/25/2022: Thyroid ultrasound: Stable nodule, no  follow-up needed  Pt denies: - feeling nodules in neck - hoarseness - dysphagia - choking  DM2:  Reviewed HbA1c levels: Lab Results  Component Value Date   HGBA1C 6.6 (A) 05/27/2022   HGBA1C 6.4 (A) 01/21/2022   HGBA1C 6.7 (A) 09/21/2021   HGBA1C 7.4 (A) 05/25/2021   HGBA1C 7.6 (A) 01/20/2021   HGBA1C 6.6 (A) 09/25/2020   HGBA1C 7.1 (A) 05/22/2020   HGBA1C 7.3 (A) 01/17/2020   HGBA1C 6.8 (A) 09/27/2019   HGBA1C 7.3 (A) 05/23/2019   She is on: - Metformin ER 1000 mg with dinner >> 1500 mg daily with dinner - Farxiga 5 mg before b'fast -  >> Ozempic 1 mg weekly - Glipizide 5 mg after dinner! >> Moved to before dinner - occasionally, before larger meals  She checks her sugars twice a day : - am: [phone] >> 122-143 >> 114-126 >> 120-130s >> 113-138, 143, 160 - 2h after b'fast: n/c - lunch: n/c >> 139 >> n/c - 2h after lunch: n/c - dinner: 103-163, 187 >> 110-152 >> 104-112 >> 94 >> 102 - 2h after dinner: 112-158, 164 >> 130-150s >> 130-160s, 187 >> 106-152, 166 - bedtime: n/c Lowest: 112 >> 111>> 98; hypoglycemia awareness in the 70s. Highest: 337 >> 232 (pizza) >> 164 >> 187 >> 169 Meter: Accu-Chek  No CKD: 12/29/2021: Glucose 97, BUN/creatinine 20/0.82, GFR 81 06/16/2021: Gucose 111, BUN/creatinine 15/0.77, GFR 87 12/22/2020: Glucose 111, BUN/creatinine 15/0.68, GFR 99 12/16/2019: Glucose 122, BUN/creatinine 14/0.74, GFR  97 Lab Results  Component Value Date   BUN 11 06/10/2019   12/29/2021: ACR 22.4:  Lab Results  Component Value Date   CREATININE 0.75 06/10/2019   Lab Results  Component Value Date   MICRALBCREAT 6.5 01/27/2016  On losartan.  + HL: 12/29/2021:170/136/51/95 06/16/2021: 168/143/50/93 12/22/2020: 179/152/57/95 12/16/2019:176/141/50/101 Lab Results  Component Value Date   CHOL 173 06/10/2019   HDL 53 06/10/2019   LDLCALC 92 06/10/2019   TRIG 161 (H) 06/10/2019  On Lipitor 20.  Last eye exam: 12/28/2021: No DR reportedly, but  hypertensive retinopathy - Dr. Blima Ledger.  Last foot exam 09/21/2021.  She also has a history of HTN, asthma, vitamin D deficiency. Also, tremors >> had to decrease Propranolol dose 2/2 wheezing. She also has hair loss and sees Dr. Isaac Laud, dermatologist at [facility].  She specializes in hair loss.  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  ROS: + see HPI  I reviewed pt's medications, allergies, PMH, social hx, family hx, and changes were documented in the history of present illness. Otherwise, unchanged from my initial visit note.  Past Medical History:  Diagnosis Date   Allergy    Alopecia    Asthma    Back pain    Blood transfusion without reported diagnosis    with hysterectomy    Diabetes (HCC)    Diabetes mellitus (HCC)    GERD (gastroesophageal reflux disease)    Glaucoma    forming but no treatment- close evaluation per eye md    Hyperlipidemia    Hypertension    Joint pain    Lactose intolerance    Obesity    Raynauds syndrome    Swelling    Tremor    Vitamin D deficiency    Past Surgical History:  Procedure Laterality Date   ABDOMINAL HYSTERECTOMY     BREAST BIOPSY     COLONOSCOPY     MYOMECTOMY     POLYPECTOMY     Social History   Socioeconomic History   Marital status: Single    Spouse name: Not on file   Number of children: no  Social Needs  Occupational History   Occupation: Public relations account executive    Employer: [employer]  Tobacco Use   Smoking status: Never Smoker   Smokeless tobacco: Never Used  Substance and Sexual Activity   Alcohol use:  Wine, 2-3 times a year, 1 drink   Drug use: No   Current Outpatient Medications on File Prior to Visit  Medication Sig Dispense Refill   albuterol (VENTOLIN HFA) 108 (90 Base) MCG/ACT inhaler Inhale 2 puffs into the lungs every 6 (six) hours as needed for wheezing or shortness of breath. 1 Inhaler 0   albuterol (VENTOLIN HFA) 108 (90 Base) MCG/ACT inhaler Inhale 1 puff into the  lungs every 4 (four) hours as needed 18 g 0   albuterol (VENTOLIN HFA) 108 (90 Base) MCG/ACT inhaler Inhale 1 puff into the lungs every 4 (four) hours as needed. 6.7 g 0   Alcohol Swabs (ALCOHOL WIPES) 70 % PADS 30 Packages by Does not apply route 2 (two) times daily. 100 each 0   amLODipine (NORVASC) 5 MG tablet Take 1 tablet (5 mg total) by mouth daily in the morning. 90 tablet 2   amLODipine (NORVASC) 5 MG tablet take 1 tablet by mouth once daily 90 tablet 2   atorvastatin (LIPITOR) 20 MG tablet Take 1 tablet (20 mg total) by mouth daily. 90 tablet  2   benzonatate (TESSALON) 200 MG capsule Take 1 capsule by mouth 3 times a day as  needed for cough 30 capsule 0   Blood Glucose Monitoring Suppl (FREESTYLE FREEDOM LITE) w/Device KIT Use to check blood sugars 1 kit 0   Cholecalciferol (VITAMIN D-3) 5000 UNIT/ML LIQD 1 tablet     COVID-19 mRNA bivalent vaccine, Pfizer, (PFIZER COVID-19 VAC BIVALENT) injection Inject into the muscle. 0.3 mL 0   dapagliflozin propanediol (FARXIGA) 5 MG TABS tablet Take 1 tablet (5 mg total) by mouth daily before breakfast. 90 tablet 3   empagliflozin (JARDIANCE) 10 MG TABS tablet Take 1 tablet (10 mg total) by mouth daily before breakfast. 90 tablet 3   fluticasone (FLONASE) 50 MCG/ACT nasal spray Use 2 sprays in both nostrils daily. 16 g 0   glipiZIDE (GLUCOTROL) 5 MG tablet Take 1-2 tablets (5-10 mg total) by mouth daily before supper. 180 tablet 3   glucose blood (FREESTYLE LITE) test strip Use to check blood sugar 2 times daily 60 each 11   hydrochlorothiazide (HYDRODIURIL) 25 MG tablet take 1 tablet once a day in the morning 90 tablet 2   Lancets (FREESTYLE) lancets Use to check blood sugar 2 times a day. 200 each 12   losartan (COZAAR) 50 MG tablet take 1 tablet by mouth Once a day 90 tablet 2   Melatonin 3 MG TABS Take 1 tablet by mouth at bedtime as needed.     metFORMIN (GLUCOPHAGE-XR) 500 MG 24 hr tablet Take 2 tablets (1,000 mg total) by mouth 2 (two) times  daily with a meal. 360 tablet 3   montelukast (SINGULAIR) 10 MG tablet Take 1 tablet (10 mg total) by mouth daily in the evening. 90 tablet 2   montelukast (SINGULAIR) 10 MG tablet take 1 tablet by mouth Once a day 90 tablet 3   nystatin (MYCOSTATIN/NYSTOP) powder Apply 1 Application topically to affected area 2 (two) times daily, for 14 days 60 g 1   omeprazole (PRILOSEC) 20 MG capsule Take 1 capsule (20 mg total) by mouth daily at least 30 minutes before morning meal. 90 capsule 2   omeprazole (PRILOSEC) 20 MG capsule take 1 capsule 30 minutes before morning meal, Once a day 90 capsule 2   Potassium Chloride ER 20 MEQ TBCR 1 tablet with food     potassium chloride SA (KLOR-CON) 20 MEQ tablet TAKE 1 TABLET BY MOUTH ONCE DAILY WITH FOOD (Patient taking differently: Take by mouth daily. with food) 90 tablet 2   propranolol (INDERAL) 20 MG tablet Take 1 tablet (20 mg total) by mouth daily. 90 tablet 0   Semaglutide, 1 MG/DOSE, (OZEMPIC, 1 MG/DOSE,) 4 MG/3ML SOPN Inject 1 mg into the skin once a week. 9 mL 3   No current facility-administered medications on file prior to visit.   Allergies  Allergen Reactions   Erythromycin Nausea Only   Milk-Related Compounds Other (See Comments)    Lactose intollerance   Tetanus-Diphth-Acell Pertussis Hives   Family History  Problem Relation Age of Onset   Multiple myeloma Mother    Hypertension Mother    Hyperlipidemia Mother    Cancer Mother        multiple Myeloma in remission    Obesity Mother    Hypertension Father    Hyperlipidemia Father    Cancer Father    Prostate cancer Father    Asthma Other    Hyperlipidemia Other    Hypertension Other    Cancer Other  COPD Other    Stroke Other    Breast cancer Maternal Aunt    Breast cancer Paternal Aunt    Breast cancer Maternal Aunt    Colon cancer Brother 56   Colon polyps Brother    Esophageal cancer Neg Hx    Rectal cancer Neg Hx    Stomach cancer Neg Hx    PE: BP 122/80   Pulse 65    Ht 5\' 4"  (1.626 m)   Wt 239 lb 9.6 oz (108.7 kg)   LMP 08/31/2008   SpO2 91%   BMI 41.13 kg/m  Wt Readings from Last 3 Encounters:  09/30/22 239 lb 9.6 oz (108.7 kg)  05/27/22 237 lb 12.8 oz (107.9 kg)  01/21/22 242 lb (109.8 kg)   Constitutional: overweight, in NAD Eyes:EOMI, no exophthalmos ENT: no thyromegaly, no cervical lymphadenopathy Cardiovascular: RRR, No MRG Respiratory: CTA B Musculoskeletal: no deformities, Skin:  no rashes Neurological: + tremor with outstretched hands Diabetic Foot Exam - Simple   Simple Foot Form Diabetic Foot exam was performed with the following findings: Yes 09/30/2022 11:10 AM  Visual Inspection No deformities, no ulcerations, no other skin breakdown bilaterally: Yes Sensation Testing Intact to touch and monofilament testing bilaterally: Yes Pulse Check Posterior Tibialis and Dorsalis pulse intact bilaterally: Yes Comments    ASSESSMENT: 1.  Mild subclinical thyrotoxicosis  2.  Multiple thyroid nodules Thyroid U/S (04/14/2017): FINDINGS: Parenchymal Echotexture: Mildly heterogenous Isthmus: 0.5 cm Right lobe: 4.5 x 2.1 x 1.3 cm Left lobe: 4.3 x 1.4 x 1.6 cm _________________________________________________________  Nodule # 1: Location: Right; Inferior Maximum size: 2.9 cm; Other 2 dimensions: 2.0 x 2.1 cm Composition: solid/almost completely solid (2) Echogenicity: hypoechoic (2) Shape: not taller-than-wide (0) Margins: smooth (0) Echogenic foci: none (0) ACR TI-RADS total points: 4. ACR TI-RADS risk category: TR4 (4-6 points).  ACR TI-RADS recommendations: **Given size (>/= 1.5 cm) and appearance, fine needle aspiration of this moderately suspicious nodule should be considered based on TI-RADS criteria.________________________________________________  Multiple other smaller nodules measure 0.7 cm or less in size and do not meet criteria for biopsy nor follow-up.  IMPRESSION: Right lower pole nodule 1 meets criteria  for fine needle aspiration biopsy.   FNA: Adequacy Reason Satisfactory For Evaluation. Diagnosis THYROID, FINE NEEDLE ASPIRATION, RLP (SPECIMEN 1 OF 1,COLLECTED 05/11/17): CONSISTENT WITH BENIGN FOLLICULAR NODULE (BETHESDA CATEGORY II). Pecola Leisure MD Pathologist, Electronic Signature (Case signed 05/15/2017) Specimen Clinical Information Right Inferior, 2.9cm; Other 2 dimensions: 2.0 x 2.1cm, solid/almost completely solid, hypoechoic, TI-RADS total points - 4, Moderately suspicious nodule Source Thyroid, Fine Needle Aspiration, Right, RLP (Specimen 1 of 1, collected on 05/11/17)  Thyroid U/S (06/05/2019): CLINICAL DATA:  Goiter. 62 year old female with a history of thyroid nodules. The 2.9 cm right inferior thyroid nodule was previously biopsied on 05/11/2017   Parenchymal Echotexture: Mildly heterogenous Isthmus: 0.4 cm Right lobe: 4.9 x 2.2 x 1.5 cm Left lobe: 4.0 x 1.6 x 1.3 cm _________________________________________________________   The previously biopsied nodule in the right inferior gland is unchanged at 2.9 x 2.2 x 2.1 cm. Additional small subcentimeter thyroid nodules again noted scattered throughout the left gland. None of these nodules demonstrates significant interval growth or change in morphology. These lesions do not meet criteria for biopsy or dedicated imaging surveillance.   IMPRESSION: No significant interval change in the size or appearance of the previously biopsied 2.9 cm nodule in the right inferior gland.   No new or suspicious thyroid nodules identified.  Thyroid U/S (01/08/2021):  Parenchymal Echotexture:  Mildly heterogeneous                 Isthmus: 0.4 cm                 Right lobe: 5.1 x 1.8 x 2.1 cm                 Left lobe: 4.7 x 1.1 x 1.4 cm                 _________________________________________________________                 Estimated total number of nodules >/= 1 cm: 1                 Number of spongiform nodules >/= 2 cm  not described below (TR1): 0                 Number of mixed cystic and solid nodules >/= 1.5 cm not described        below (TR2): 0                 _________________________________________________________                 The previously biopsied 2.5 cm solid hypoechoic right inferior        thyroid nodule is unchanged in size from prior examination.                 Subcentimeter left thyroid nodule does not meet criteria for imaging        follow-up or FNA.                 Prominent left neck lymph node measuring 2.5 x 1.1 x 0.9 cm is not        enlarged by imaging.                 IMPRESSION:        1. Previously biopsied dominant right thyroid nodule is not        significantly changed in size. Please correlate with prior FNA        results.        2. Prominent left neck lymph node is not enlarged by imaging        criteria. It is likely reactive, however continued clinical        surveillance is recommended. If there is evidence of enlargement        over time, repeat imaging should be considered.     Thyroid U/S (02/25/2022): Parenchymal Echotexture: Mildly heterogenous  Isthmus: 0.5 cm  Right lobe: 0.1 cm x 2.7 cm x 1.3 cm  Left lobe: 4.5 cm x 1.4 cm x 1.4 cm  _________________________________________________________   Estimated total number of nodules >/= 1 cm: 1 _________________________________________________________   Nodule labeled 1 inferior right thyroid, 2.9 cm, unchanged in size. Nodule was previously biopsied. Assuming benign result, no further specific follow-up would be indicated.   Nodules labeled 2, 3, 4 in the left thyroid are unchanged, none of which meet criteria for continued surveillance.   No adenopathy   Recommendations follow those established by the new ACR TI-RADS criteria (J Am Coll Radiol 2017;14:587-595).   IMPRESSION: Unchanged appearance over time of heterogeneous multinodular thyroid as above.  3. DM2,  non-insulin-dependent  PLAN:  1. Patient with with history of subclinical hyperthyroidism with possible thyrotoxic symptoms: Hot flashes (but she is also postmenopausal), palpitations, hair loss.  These occur for her. -  TSH was normal at last check in 12/2021  2.  Multiple thyroid nodules -She has neck compression symptoms -She had a solid 2.9 cm right thyroid nodule per review of the ultrasound from 03/2017.  We biopsied the nodule in 05/2017 with benign results.  Repeat thyroid ultrasound in 06/2019 showed that the nodule was stable.  She also had several smaller nodules, not worrisome.  She had another neck ultrasound checked by PCP in 12/2020 (in the [place] system) of which I could reviewed the report, but not the images.  The right nodule measured 2.5 cm and appeared stable.  She had a slightly enlarged left cervical lymph node, likely reactive.  She had a new ultrasound in 01/2022 and this did not show any lymphadenopathy.  The nodules were all stable and did not meet criteria for continued surveillance or any other intervention -Will continue to monitor her clinically for now  3. DM2 -Patient with history of uncontrolled diabetes, with improved control especially after switching from Victoza to Ozempic.  We also have her on metformin, sulfonylurea, and SGLT2 inhibitor.  At last visit, despite increased stress from moving, sugars remain mostly at goal.  We did not change the regimen.  Marcelline Deist was expensive for her after the change of insurance so I gave her a written prescription for Jardiance to see if this had a better price.  HbA1c at that time was 6.6%, slightly higher, but still at goal. - at today's visit, sugars been at goal, without hyperglycemic spikes or hypoglycemic events.  She did not have to use the glipizide a regular basis, but she does have this at hand to use it whenever she is going out to eat.  We will continue this. -Since last visit, she was able to obtain Farxiga at no cost.   - I suggested to: Patient Instructions  Please continue: - Metformin ER 1500 mg with dinner - Glipizide 5 mg before a larger dinner - Farxiga 5 mg before b'fast - Ozempic 1 mg weekly  Please return in 4-6 months with your sugar log.   - we checked her HbA1c: 6.6% (stable) - advised to check sugars at different times of the day - 1x a day, rotating check times - advised for yearly eye exams >> she is UTD - return to clinic in 4-6 months  Carlus Pavlov, MD PhD [facility] Endocrinology

## 2022-09-30 NOTE — Patient Instructions (Addendum)
Please continue: - Metformin ER 1500 mg with dinner - Glipizide 5 mg before a larger dinner - Farxiga 5 mg before b'fast - Ozempic 1 mg weekly  Please return in 4-6 months with your sugar log.

## 2022-11-01 ENCOUNTER — Other Ambulatory Visit: Payer: Self-pay

## 2022-11-24 ENCOUNTER — Other Ambulatory Visit (HOSPITAL_COMMUNITY): Payer: Self-pay

## 2022-11-24 ENCOUNTER — Other Ambulatory Visit: Payer: Self-pay

## 2022-11-24 MED ORDER — LOSARTAN POTASSIUM 50 MG PO TABS
50.0000 mg | ORAL_TABLET | Freq: Every day | ORAL | 1 refills | Status: DC
  Filled 2022-11-24: qty 90, 90d supply, fill #0
  Filled 2023-02-23: qty 90, 90d supply, fill #1

## 2022-11-24 MED ORDER — PROPRANOLOL HCL 20 MG PO TABS
20.0000 mg | ORAL_TABLET | Freq: Every day | ORAL | 0 refills | Status: DC
  Filled 2022-11-24: qty 90, 90d supply, fill #0

## 2022-11-24 MED ORDER — OMEPRAZOLE 20 MG PO CPDR
20.0000 mg | DELAYED_RELEASE_CAPSULE | Freq: Every day | ORAL | 2 refills | Status: DC
  Filled 2022-11-24: qty 90, 90d supply, fill #0
  Filled 2023-02-23: qty 90, 90d supply, fill #1
  Filled 2023-05-24: qty 90, 90d supply, fill #2

## 2022-11-24 MED ORDER — ATORVASTATIN CALCIUM 20 MG PO TABS
20.0000 mg | ORAL_TABLET | Freq: Every day | ORAL | 1 refills | Status: DC
  Filled 2022-11-24: qty 90, 90d supply, fill #0
  Filled 2023-02-23: qty 90, 90d supply, fill #1

## 2022-11-24 MED ORDER — HYDROCHLOROTHIAZIDE 25 MG PO TABS
25.0000 mg | ORAL_TABLET | Freq: Every morning | ORAL | 1 refills | Status: DC
  Filled 2022-11-24: qty 90, 90d supply, fill #0
  Filled 2023-02-23: qty 90, 90d supply, fill #1

## 2022-11-24 MED ORDER — AMLODIPINE BESYLATE 5 MG PO TABS
5.0000 mg | ORAL_TABLET | Freq: Every day | ORAL | 1 refills | Status: DC
  Filled 2022-11-24: qty 90, 90d supply, fill #0
  Filled 2023-02-23: qty 90, 90d supply, fill #1

## 2022-12-03 ENCOUNTER — Other Ambulatory Visit (HOSPITAL_COMMUNITY): Payer: Self-pay

## 2022-12-03 DIAGNOSIS — R0981 Nasal congestion: Secondary | ICD-10-CM | POA: Diagnosis not present

## 2022-12-03 DIAGNOSIS — R059 Cough, unspecified: Secondary | ICD-10-CM | POA: Diagnosis not present

## 2022-12-03 DIAGNOSIS — J329 Chronic sinusitis, unspecified: Secondary | ICD-10-CM | POA: Diagnosis not present

## 2022-12-03 DIAGNOSIS — J40 Bronchitis, not specified as acute or chronic: Secondary | ICD-10-CM | POA: Diagnosis not present

## 2022-12-03 MED ORDER — BENZONATATE 100 MG PO CAPS
100.0000 mg | ORAL_CAPSULE | Freq: Three times a day (TID) | ORAL | 0 refills | Status: DC | PRN
  Filled 2022-12-03: qty 30, 5d supply, fill #0

## 2022-12-03 MED ORDER — AMOXICILLIN-POT CLAVULANATE 875-125 MG PO TABS
1.0000 | ORAL_TABLET | Freq: Two times a day (BID) | ORAL | 0 refills | Status: DC
  Filled 2022-12-03: qty 20, 10d supply, fill #0

## 2022-12-05 ENCOUNTER — Ambulatory Visit: Payer: Commercial Managed Care - PPO

## 2022-12-07 ENCOUNTER — Inpatient Hospital Stay
Admission: RE | Admit: 2022-12-07 | Discharge: 2022-12-07 | Discharge status: Home / Self Care | Payer: Commercial Managed Care - PPO | Source: Ambulatory Visit | Attending: Family Medicine | Admitting: Family Medicine

## 2022-12-07 DIAGNOSIS — Z1231 Encounter for screening mammogram for malignant neoplasm of breast: Secondary | ICD-10-CM | POA: Diagnosis not present

## 2023-01-03 DIAGNOSIS — J4520 Mild intermittent asthma, uncomplicated: Secondary | ICD-10-CM | POA: Diagnosis not present

## 2023-01-03 DIAGNOSIS — Z6841 Body Mass Index (BMI) 40.0 and over, adult: Secondary | ICD-10-CM | POA: Diagnosis not present

## 2023-01-03 DIAGNOSIS — Z0000 Encounter for general adult medical examination without abnormal findings: Secondary | ICD-10-CM | POA: Diagnosis not present

## 2023-01-03 DIAGNOSIS — E1169 Type 2 diabetes mellitus with other specified complication: Secondary | ICD-10-CM | POA: Diagnosis not present

## 2023-01-03 DIAGNOSIS — E119 Type 2 diabetes mellitus without complications: Secondary | ICD-10-CM | POA: Diagnosis not present

## 2023-01-03 DIAGNOSIS — K219 Gastro-esophageal reflux disease without esophagitis: Secondary | ICD-10-CM | POA: Diagnosis not present

## 2023-01-03 DIAGNOSIS — E7800 Pure hypercholesterolemia, unspecified: Secondary | ICD-10-CM | POA: Diagnosis not present

## 2023-01-03 DIAGNOSIS — E041 Nontoxic single thyroid nodule: Secondary | ICD-10-CM | POA: Diagnosis not present

## 2023-01-03 DIAGNOSIS — I10 Essential (primary) hypertension: Secondary | ICD-10-CM | POA: Diagnosis not present

## 2023-01-04 DIAGNOSIS — H524 Presbyopia: Secondary | ICD-10-CM | POA: Diagnosis not present

## 2023-01-04 LAB — HM DIABETES EYE EXAM

## 2023-01-06 ENCOUNTER — Encounter: Payer: Self-pay | Admitting: Internal Medicine

## 2023-01-15 DIAGNOSIS — R058 Other specified cough: Secondary | ICD-10-CM | POA: Diagnosis not present

## 2023-01-15 DIAGNOSIS — Z8709 Personal history of other diseases of the respiratory system: Secondary | ICD-10-CM | POA: Diagnosis not present

## 2023-01-15 DIAGNOSIS — J209 Acute bronchitis, unspecified: Secondary | ICD-10-CM | POA: Diagnosis not present

## 2023-01-26 ENCOUNTER — Other Ambulatory Visit (HOSPITAL_COMMUNITY): Payer: Self-pay

## 2023-01-26 ENCOUNTER — Ambulatory Visit: Payer: Commercial Managed Care - PPO | Admitting: Internal Medicine

## 2023-01-26 ENCOUNTER — Encounter: Payer: Self-pay | Admitting: Internal Medicine

## 2023-01-26 VITALS — BP 122/84 | HR 94 | Ht 64.0 in | Wt 244.2 lb

## 2023-01-26 DIAGNOSIS — E0590 Thyrotoxicosis, unspecified without thyrotoxic crisis or storm: Secondary | ICD-10-CM | POA: Diagnosis not present

## 2023-01-26 DIAGNOSIS — Z7984 Long term (current) use of oral hypoglycemic drugs: Secondary | ICD-10-CM | POA: Diagnosis not present

## 2023-01-26 DIAGNOSIS — E042 Nontoxic multinodular goiter: Secondary | ICD-10-CM

## 2023-01-26 DIAGNOSIS — Z7985 Long-term (current) use of injectable non-insulin antidiabetic drugs: Secondary | ICD-10-CM | POA: Diagnosis not present

## 2023-01-26 DIAGNOSIS — E1165 Type 2 diabetes mellitus with hyperglycemia: Secondary | ICD-10-CM

## 2023-01-26 LAB — POCT GLYCOSYLATED HEMOGLOBIN (HGB A1C): Hemoglobin A1C: 6.8 % — AB (ref 4.0–5.6)

## 2023-01-26 MED ORDER — DAPAGLIFLOZIN PROPANEDIOL 10 MG PO TABS
10.0000 mg | ORAL_TABLET | Freq: Every day | ORAL | 3 refills | Status: DC
  Filled 2023-01-26: qty 90, 90d supply, fill #0
  Filled 2023-04-21: qty 90, 90d supply, fill #1
  Filled 2023-07-21: qty 90, 90d supply, fill #2
  Filled 2023-10-19: qty 90, 90d supply, fill #3

## 2023-01-26 NOTE — Patient Instructions (Signed)
Please continue: - Metformin ER 1500 mg with dinner - Glipizide 5 mg before a larger dinner - Ozempic 1 mg weekly  Please increase: - Farxiga 10 mg before b'fast  Please return in 4-6 months with your sugar log.

## 2023-01-26 NOTE — Progress Notes (Signed)
Patient ID: Veronica Russell, female   DOB: [birth year], 62 y.o.   MRN: [id number]   HPI  Veronica Russell is a 62 y.o.-year-old female, returning for follow-up for h/o subclinical hyperthyroidism, right thyroid nodule, and DM2, non-insulin-dependent.  Last visit 4 months ago.  Interim history: No increased urination, blurry vision, nausea, chest pain.  She does have hip pain. She had a URI 01/15/2023 >> went to UC. She was started on a 5-day steroid taper  >> sugars higher.  She still has cough, but otherwise feeling much better.  Subclinical hyperthyroidism:  Patient was found to have a slightly low TSH at her first visit in the weight loss clinic in 01/2016. Her TFTs normalized after 2018.  Reviewed her TFTs: 01/03/2023: TSH 0.78 12/29/2021: TSH 0.63 12/22/2020: TSH 0.65 Lab Results  Component Value Date   TSH 0.52 05/22/2020   TSH 0.57 09/27/2019   TSH 0.63 09/20/2018   TSH 0.50 10/05/2017   TSH 0.52 04/04/2017   TSH 0.436 (L) 09/29/2016   TSH 0.443 (L) 05/09/2016   TSH 0.425 (L) 01/27/2016   FREET4 0.83 05/22/2020   FREET4 0.94 09/27/2019   FREET4 0.80 09/20/2018   FREET4 0.75 10/05/2017   FREET4 0.75 04/04/2017   FREET4 1.17 09/29/2016   FREET4 1.24 05/09/2016   FREET4 1.17 01/27/2016    Lab Results  Component Value Date   T3FREE 3.7 05/22/2020   T3FREE 4.2 09/27/2019   T3FREE 3.4 09/20/2018   T3FREE 3.9 10/05/2017   T3FREE 3.7 04/04/2017    She has chronic: Hot flashes Palpitations with exertion Hair loss Hereditary tremors  Pt does have a FH of thyroid ds. - M aunt. No FH of thyroid cancer. No h/o radiation tx to head or neck. No herbal supplements. No Biotin use. No recent steroids use.   Right thyroid nodule:  Reviewed previous investigation: 04/14/2017: Thyroid ultrasound: 2.9 x 2 x 2.7 solid, hypoechoic, right thyroid nodule 05/11/2017: FNA of the dominant thyroid nodule: Benign 06/05/2019: Thyroid ultrasound: Stable nodules 01/08/2021: Thyroid  ultrasound: Stable nodules, slightly enlarged left neck lymph node  02/25/2022: Thyroid ultrasound: Stable nodule, no follow-up needed  Pt denies: - feeling nodules in neck - hoarseness - dysphagia - choking  DM2:  Reviewed HbA1c levels: Lab Results  Component Value Date   HGBA1C 6.6 (A) 09/30/2022   HGBA1C 6.6 (A) 05/27/2022   HGBA1C 6.4 (A) 01/21/2022   HGBA1C 6.7 (A) 09/21/2021   HGBA1C 7.4 (A) 05/25/2021   HGBA1C 7.6 (A) 01/20/2021   HGBA1C 6.6 (A) 09/25/2020   HGBA1C 7.1 (A) 05/22/2020   HGBA1C 7.3 (A) 01/17/2020   HGBA1C 6.8 (A) 09/27/2019   She is on: - Metformin ER 1000 mg with dinner >> 1500 mg daily with dinner - Farxiga 5 mg before b'fast -  >> Ozempic 1 mg weekly - Glipizide 5 mg after dinner! >> Moved to before dinner - occasionally, before larger meals  She checks her sugars twice a day : - am: 120-130s >> 113-138, 143, 160 >> 116-158, 165 - 2h after b'fast: n/c - lunch: n/c >> 139 >> n/c - 2h after lunch: n/c - dinner: 110-152 >> 104-112 >> 94 >> 102 >> n/c - 2h after dinner: 130-160s, 187 >> 106-152, 166 >> 139 - bedtime: n/c Lowest: 112 >> 111>> 98; hypoglycemia awareness in the 70s. Highest: 337 >> 232 (pizza) >> 164 >> 187 >> 169 Meter: Accu-Chek  No CKD:   Lab Results  Component Value Date   BUN 11 06/10/2019  Lab Results  Component Value Date   CREATININE 0.75 06/10/2019   Lab Results  Component Value Date   MICRALBCREAT 22.4 12/29/2021   MICRALBCREAT 6.5 01/27/2016  On losartan.  + HL:   Lab Results  Component Value Date   CHOL 173 06/10/2019   HDL 53 06/10/2019   LDLCALC 92 06/10/2019   TRIG 161 (H) 06/10/2019  On Lipitor 20.  Last eye exam: 01/04/2023: no DR, but hypertensive retinopathy - Dr. Blima Ledger.  Last foot exam 09/30/2022.  She also has a history of HTN, asthma, vitamin D deficiency. Also, tremors >> had to decrease Propranolol dose 2/2 wheezing. She also has hair loss and sees Dr. Isaac Laud,  dermatologist at [facility].  She specializes in hair loss.  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  ROS: + see HPI  I reviewed pt's medications, allergies, PMH, social hx, family hx, and changes were documented in the history of present illness. Otherwise, unchanged from my initial visit note.  Past Medical History:  Diagnosis Date   Allergy    Alopecia    Asthma    Back pain    Blood transfusion without reported diagnosis    with hysterectomy    Diabetes (HCC)    Diabetes mellitus (HCC)    GERD (gastroesophageal reflux disease)    Glaucoma    forming but no treatment- close evaluation per eye md    Hyperlipidemia    Hypertension    Joint pain    Lactose intolerance    Obesity    Raynauds syndrome    Swelling    Tremor    Vitamin D deficiency    Past Surgical History:  Procedure Laterality Date   ABDOMINAL HYSTERECTOMY     BREAST BIOPSY     COLONOSCOPY     MYOMECTOMY     POLYPECTOMY     Social History   Socioeconomic History   Marital status: Single    Spouse name: Not on file   Number of children: no  Social Needs  Occupational History   Occupation: Public relations account executive    Employer: [employer]  Tobacco Use   Smoking status: Never Smoker   Smokeless tobacco: Never Used  Substance and Sexual Activity   Alcohol use:  Wine, 2-3 times a year, 1 drink   Drug use: No   Current Outpatient Medications on File Prior to Visit  Medication Sig Dispense Refill   albuterol (VENTOLIN HFA) 108 (90 Base) MCG/ACT inhaler Inhale 2 puffs into the lungs every 6 (six) hours as needed for wheezing or shortness of breath. 1 Inhaler 0   albuterol (VENTOLIN HFA) 108 (90 Base) MCG/ACT inhaler Inhale 1 puff into the lungs every 4 (four) hours as needed 18 g 0   albuterol (VENTOLIN HFA) 108 (90 Base) MCG/ACT inhaler Inhale 1 puff into the lungs every 4 (four) hours as needed. 6.7 g 0   Alcohol Swabs (ALCOHOL WIPES) 70 % PADS 30 Packages by Does not apply route 2  (two) times daily. 100 each 0   amLODipine (NORVASC) 5 MG tablet Take 1 tablet (5 mg total) by mouth daily in the morning. 90 tablet 2   amLODipine (NORVASC) 5 MG tablet Take 1 tablet (5 mg total) by mouth daily. 90 tablet 1   amoxicillin-clavulanate (AUGMENTIN) 875-125 MG tablet Take 1 tablet by mouth every 12 (twelve) hours for 10 days 20 tablet 0   atorvastatin (LIPITOR) 20 MG tablet Take 1 tablet (20 mg  total) by mouth daily. 90 tablet 1   benzonatate (TESSALON PERLES) 100 MG capsule Take 1-2 capsules (100-200 mg total) by mouth 3 (three) times daily as needed for cough 30 capsule 0   benzonatate (TESSALON) 200 MG capsule Take 1 capsule by mouth 3 times a day as  needed for cough (Patient not taking: Reported on 09/30/2022) 30 capsule 0   Blood Glucose Monitoring Suppl (FREESTYLE FREEDOM LITE) w/Device KIT Use to check blood sugars 1 kit 0   Cholecalciferol (VITAMIN D-3) 5000 UNIT/ML LIQD 1 tablet     COVID-19 mRNA bivalent vaccine, Pfizer, (PFIZER COVID-19 VAC BIVALENT) injection Inject into the muscle. 0.3 mL 0   dapagliflozin propanediol (FARXIGA) 5 MG TABS tablet Take 1 tablet (5 mg total) by mouth daily before breakfast. 90 tablet 3   empagliflozin (JARDIANCE) 10 MG TABS tablet Take 1 tablet (10 mg total) by mouth daily before breakfast. (Patient not taking: Reported on 09/30/2022) 90 tablet 3   fluticasone (FLONASE) 50 MCG/ACT nasal spray Use 2 sprays in both nostrils daily. 16 g 0   glipiZIDE (GLUCOTROL) 5 MG tablet Take 1-2 tablets (5-10 mg total) by mouth daily before supper. 180 tablet 3   glucose blood (FREESTYLE LITE) test strip Use to check blood sugar 2 times daily 60 each 11   hydrochlorothiazide (HYDRODIURIL) 25 MG tablet Take 1 tablet (25 mg total) by mouth in the morning. 90 tablet 1   Lancets (FREESTYLE) lancets Use to check blood sugar 2 times a day. 200 each 12   losartan (COZAAR) 50 MG tablet Take 1 tablet (50 mg total) by mouth daily. 90 tablet 1   Melatonin 3 MG TABS Take  1 tablet by mouth at bedtime as needed.     metFORMIN (GLUCOPHAGE-XR) 500 MG 24 hr tablet Take 2 tablets (1,000 mg total) by mouth 2 (two) times daily with a meal. 360 tablet 3   montelukast (SINGULAIR) 10 MG tablet Take 1 tablet (10 mg total) by mouth daily in the evening. 90 tablet 2   montelukast (SINGULAIR) 10 MG tablet take 1 tablet by mouth Once a day 90 tablet 3   nystatin (MYCOSTATIN/NYSTOP) powder Apply 1 Application topically to affected area 2 (two) times daily, for 14 days 60 g 1   omeprazole (PRILOSEC) 20 MG capsule Take 1 capsule (20 mg total) by mouth daily at least 30 minutes before morning meal. 90 capsule 2   omeprazole (PRILOSEC) 20 MG capsule Take 1 capsule (20 mg total) by mouth once daily 30 minutes before morning meal. 90 capsule 2   Potassium Chloride ER 20 MEQ TBCR 1 tablet with food (Patient not taking: Reported on 09/30/2022)     potassium chloride SA (KLOR-CON) 20 MEQ tablet TAKE 1 TABLET BY MOUTH ONCE DAILY WITH FOOD (Patient taking differently: Take by mouth daily. with food) 90 tablet 2   propranolol (INDERAL) 20 MG tablet Take 1 tablet (20 mg total) by mouth daily. 90 tablet 0   Semaglutide, 1 MG/DOSE, (OZEMPIC, 1 MG/DOSE,) 4 MG/3ML SOPN Inject 1 mg into the skin once a week. 9 mL 3   No current facility-administered medications on file prior to visit.   Allergies  Allergen Reactions   Erythromycin Nausea Only   Milk-Related Compounds Other (See Comments)    Lactose intollerance   Tetanus-Diphth-Acell Pertussis Hives   Family History  Problem Relation Age of Onset   Multiple myeloma Mother    Hypertension Mother    Hyperlipidemia Mother    Cancer Mother  multiple Myeloma in remission    Obesity Mother    Hypertension Father    Hyperlipidemia Father    Cancer Father    Prostate cancer Father    Asthma Other    Hyperlipidemia Other    Hypertension Other    Cancer Other    COPD Other    Stroke Other    Breast cancer Maternal Aunt    Breast  cancer Paternal Aunt    Breast cancer Maternal Aunt    Colon cancer Brother 13   Colon polyps Brother    Esophageal cancer Neg Hx    Rectal cancer Neg Hx    Stomach cancer Neg Hx    PE: BP 122/84   Pulse 94   Ht 5\' 4"  (1.626 m)   Wt 244 lb 3.2 oz (110.8 kg)   LMP 08/31/2008   BMI 41.92 kg/m  Wt Readings from Last 3 Encounters:  01/26/23 244 lb 3.2 oz (110.8 kg)  09/30/22 239 lb 9.6 oz (108.7 kg)  05/27/22 237 lb 12.8 oz (107.9 kg)   Constitutional: overweight, in NAD Eyes:EOMI, no exophthalmos ENT: no thyromegaly, no cervical lymphadenopathy Cardiovascular: No tachycardia at the end of the exam, RRR, No MRG Respiratory: CTA B Musculoskeletal: no deformities, Skin:  no rashes Neurological: + tremor with outstretched hands  ASSESSMENT: 1.  Mild subclinical thyrotoxicosis  2.  Multiple thyroid nodules Thyroid U/S (04/14/2017): FINDINGS: Parenchymal Echotexture: Mildly heterogenous Isthmus: 0.5 cm Right lobe: 4.5 x 2.1 x 1.3 cm Left lobe: 4.3 x 1.4 x 1.6 cm _________________________________________________________  Nodule # 1: Location: Right; Inferior Maximum size: 2.9 cm; Other 2 dimensions: 2.0 x 2.1 cm Composition: solid/almost completely solid (2) Echogenicity: hypoechoic (2) Shape: not taller-than-wide (0) Margins: smooth (0) Echogenic foci: none (0) ACR TI-RADS total points: 4. ACR TI-RADS risk category: TR4 (4-6 points).  ACR TI-RADS recommendations: **Given size (>/= 1.5 cm) and appearance, fine needle aspiration of this moderately suspicious nodule should be considered based on TI-RADS criteria.________________________________________________  Multiple other smaller nodules measure 0.7 cm or less in size and do not meet criteria for biopsy nor follow-up.  IMPRESSION: Right lower pole nodule 1 meets criteria for fine needle aspiration biopsy.   FNA: Adequacy Reason Satisfactory For Evaluation. Diagnosis THYROID, FINE NEEDLE ASPIRATION, RLP  (SPECIMEN 1 OF 1,COLLECTED 05/11/17): CONSISTENT WITH BENIGN FOLLICULAR NODULE (BETHESDA CATEGORY II). Pecola Leisure MD Pathologist, Electronic Signature (Case signed 05/15/2017) Specimen Clinical Information Right Inferior, 2.9cm; Other 2 dimensions: 2.0 x 2.1cm, solid/almost completely solid, hypoechoic, TI-RADS total points - 4, Moderately suspicious nodule Source Thyroid, Fine Needle Aspiration, Right, RLP (Specimen 1 of 1, collected on 05/11/17)  Thyroid U/S (06/05/2019): CLINICAL DATA:  Goiter. 62 year old female with a history of thyroid nodules. The 2.9 cm right inferior thyroid nodule was previously biopsied on 05/11/2017   Parenchymal Echotexture: Mildly heterogenous Isthmus: 0.4 cm Right lobe: 4.9 x 2.2 x 1.5 cm Left lobe: 4.0 x 1.6 x 1.3 cm _________________________________________________________   The previously biopsied nodule in the right inferior gland is unchanged at 2.9 x 2.2 x 2.1 cm. Additional small subcentimeter thyroid nodules again noted scattered throughout the left gland. None of these nodules demonstrates significant interval growth or change in morphology. These lesions do not meet criteria for biopsy or dedicated imaging surveillance.   IMPRESSION: No significant interval change in the size or appearance of the previously biopsied 2.9 cm nodule in the right inferior gland.   No new or suspicious thyroid nodules identified.  Thyroid U/S (01/08/2021):  Parenchymal Echotexture:  Mildly heterogeneous                 Isthmus: 0.4 cm                 Right lobe: 5.1 x 1.8 x 2.1 cm                 Left lobe: 4.7 x 1.1 x 1.4 cm                 _________________________________________________________                 Estimated total number of nodules >/= 1 cm: 1                 Number of spongiform nodules >/= 2 cm not described below (TR1): 0                 Number of mixed cystic and solid nodules >/= 1.5 cm not described        below (TR2): 0                  _________________________________________________________                 The previously biopsied 2.5 cm solid hypoechoic right inferior        thyroid nodule is unchanged in size from prior examination.                 Subcentimeter left thyroid nodule does not meet criteria for imaging        follow-up or FNA.                 Prominent left neck lymph node measuring 2.5 x 1.1 x 0.9 cm is not        enlarged by imaging.                 IMPRESSION:        1. Previously biopsied dominant right thyroid nodule is not        significantly changed in size. Please correlate with prior FNA        results.        2. Prominent left neck lymph node is not enlarged by imaging        criteria. It is likely reactive, however continued clinical        surveillance is recommended. If there is evidence of enlargement        over time, repeat imaging should be considered.     Thyroid U/S (02/25/2022): Parenchymal Echotexture: Mildly heterogenous  Isthmus: 0.5 cm  Right lobe: 0.1 cm x 2.7 cm x 1.3 cm  Left lobe: 4.5 cm x 1.4 cm x 1.4 cm  _________________________________________________________   Estimated total number of nodules >/= 1 cm: 1 _________________________________________________________   Nodule labeled 1 inferior right thyroid, 2.9 cm, unchanged in size. Nodule was previously biopsied. Assuming benign result, no further specific follow-up would be indicated.   Nodules labeled 2, 3, 4 in the left thyroid are unchanged, none of which meet criteria for continued surveillance.   No adenopathy   Recommendations follow those established by the new ACR TI-RADS criteria (J Am Coll Radiol 2017;14:587-595).   IMPRESSION: Unchanged appearance over time of heterogeneous multinodular thyroid as above.  3. DM2, non-insulin-dependent  PLAN:  1. Patient with history of subclinical hypothyroidism with possible thyrotoxic symptoms: Hot flashes, palpitations, hair loss.  These  symptoms continue. -TSH normal at last check in 01/2023  2.  Multiple thyroid nodules -No neck compression symptoms -She had a solid 2.9 cm right thyroid nodule per review of the thyroid ultrasound from 03/2017.  A biopsy of the nodule returned benign in 05/2017.  Repeat thyroid ultrasound in 06/2019 showed that this nodule remained stable.  She also has several smaller nodules, not worrisome.  She had another neck ultrasound checked by PCP in 12/2020 (in the [facility] system) of which I could reviewed the report, but not the images.  The right nodule measured 2.5 cm and appeared stable.  She had a slightly enlarged left cervical lymph node, likely reactive.  She had a new ultrasound in 01/2022 and this did not show any lymphadenopathy.  The nodules were stable and did not meet criteria for continued imaging or any other intervention. -Will continue to monitor her clinically for now  3. DM2 -Patient with history of uncontrolled diabetes with improved control especially after switching from Victoza to Ozempic.  She also continues metformin, as needed sulfonylurea, and SGLT2 inhibitor.  At today's visit sugars are slightly higher than before especially in the morning.  They were even higher during the holidays.  She is not taking later in the day and I advised her to start.  Since HbA1c is also higher, I recommended to increase the Farxiga dose.  She agrees with this plan. - I suggested to: Patient Instructions  Please continue: - Metformin ER 1500 mg with dinner - Glipizide 5 mg before a larger dinner - Ozempic 1 mg weekly  Please increase: - Farxiga 10 mg before b'fast  Please return in 4-6 months with your sugar log.   - we checked her HbA1c: 6.8% (slightly higher) - advised to check sugars at different times of the day - 1x a day, rotating check times - advised for yearly eye exams >> she is UTD - return to clinic in 4-6 months  Carlus Pavlov, MD PhD [facility] Endocrinology

## 2023-01-26 NOTE — Addendum Note (Signed)
Addended by: Pollie Meyer on: 01/26/2023 03:07 PM   Modules accepted: Orders

## 2023-01-31 ENCOUNTER — Ambulatory Visit: Payer: Commercial Managed Care - PPO | Admitting: Internal Medicine

## 2023-02-23 ENCOUNTER — Other Ambulatory Visit (HOSPITAL_COMMUNITY): Payer: Self-pay

## 2023-02-23 ENCOUNTER — Other Ambulatory Visit: Payer: Self-pay | Admitting: Internal Medicine

## 2023-02-23 ENCOUNTER — Other Ambulatory Visit: Payer: Self-pay

## 2023-02-23 MED ORDER — PROPRANOLOL HCL 20 MG PO TABS
20.0000 mg | ORAL_TABLET | Freq: Every day | ORAL | 1 refills | Status: DC
  Filled 2023-02-23: qty 90, 90d supply, fill #0
  Filled 2023-05-24: qty 90, 90d supply, fill #1

## 2023-02-23 MED ORDER — MONTELUKAST SODIUM 10 MG PO TABS
10.0000 mg | ORAL_TABLET | Freq: Every day | ORAL | 1 refills | Status: DC
  Filled 2023-02-23: qty 90, 90d supply, fill #0
  Filled 2023-05-24: qty 90, 90d supply, fill #1

## 2023-02-23 MED ORDER — METFORMIN HCL ER 500 MG PO TB24
1500.0000 mg | ORAL_TABLET | Freq: Every day | ORAL | 3 refills | Status: DC
  Filled 2023-02-23: qty 270, 90d supply, fill #0
  Filled 2023-05-24: qty 270, 90d supply, fill #1
  Filled 2023-08-26: qty 270, 90d supply, fill #2

## 2023-04-07 ENCOUNTER — Telehealth: Admitting: Physician Assistant

## 2023-04-07 ENCOUNTER — Other Ambulatory Visit (HOSPITAL_COMMUNITY): Payer: Self-pay

## 2023-04-07 DIAGNOSIS — J4531 Mild persistent asthma with (acute) exacerbation: Secondary | ICD-10-CM | POA: Diagnosis not present

## 2023-04-07 DIAGNOSIS — J019 Acute sinusitis, unspecified: Secondary | ICD-10-CM | POA: Diagnosis not present

## 2023-04-07 DIAGNOSIS — B9689 Other specified bacterial agents as the cause of diseases classified elsewhere: Secondary | ICD-10-CM | POA: Diagnosis not present

## 2023-04-07 MED ORDER — AMOXICILLIN-POT CLAVULANATE 875-125 MG PO TABS
1.0000 | ORAL_TABLET | Freq: Two times a day (BID) | ORAL | 0 refills | Status: DC
  Filled 2023-04-07: qty 14, 7d supply, fill #0

## 2023-04-07 MED ORDER — PREDNISONE 20 MG PO TABS
40.0000 mg | ORAL_TABLET | Freq: Every day | ORAL | 0 refills | Status: DC
  Filled 2023-04-07: qty 10, 5d supply, fill #0

## 2023-04-07 NOTE — Progress Notes (Signed)

## 2023-05-24 ENCOUNTER — Other Ambulatory Visit: Payer: Self-pay

## 2023-05-24 ENCOUNTER — Other Ambulatory Visit (HOSPITAL_COMMUNITY): Payer: Self-pay

## 2023-05-24 ENCOUNTER — Other Ambulatory Visit: Payer: Self-pay | Admitting: Internal Medicine

## 2023-05-24 MED ORDER — LOSARTAN POTASSIUM 50 MG PO TABS
50.0000 mg | ORAL_TABLET | Freq: Every day | ORAL | 0 refills | Status: DC
  Filled 2023-05-24: qty 90, 90d supply, fill #0

## 2023-05-24 MED ORDER — ATORVASTATIN CALCIUM 20 MG PO TABS
20.0000 mg | ORAL_TABLET | Freq: Every day | ORAL | 1 refills | Status: AC
  Filled 2023-05-24: qty 90, 90d supply, fill #0
  Filled 2023-08-26: qty 90, 90d supply, fill #1

## 2023-05-24 MED ORDER — HYDROCHLOROTHIAZIDE 25 MG PO TABS
25.0000 mg | ORAL_TABLET | Freq: Every morning | ORAL | 0 refills | Status: AC
  Filled 2023-05-24: qty 90, 90d supply, fill #0

## 2023-05-24 MED ORDER — AMLODIPINE BESYLATE 5 MG PO TABS
5.0000 mg | ORAL_TABLET | Freq: Every day | ORAL | 0 refills | Status: DC
  Filled 2023-05-24: qty 90, 90d supply, fill #0

## 2023-05-25 ENCOUNTER — Other Ambulatory Visit (HOSPITAL_COMMUNITY): Payer: Self-pay

## 2023-05-25 ENCOUNTER — Other Ambulatory Visit: Payer: Self-pay

## 2023-05-25 MED ORDER — FREESTYLE LITE TEST VI STRP
1.0000 | ORAL_STRIP | Freq: Two times a day (BID) | 11 refills | Status: AC
  Filled 2023-05-25: qty 100, 50d supply, fill #0
  Filled 2023-07-21: qty 100, 50d supply, fill #1
  Filled 2023-09-29: qty 100, 50d supply, fill #2
  Filled 2023-11-20: qty 100, 50d supply, fill #3
  Filled 2024-01-15 – 2024-01-19 (×2): qty 100, 50d supply, fill #4
  Filled 2024-03-06: qty 100, 50d supply, fill #5

## 2023-06-22 DIAGNOSIS — E119 Type 2 diabetes mellitus without complications: Secondary | ICD-10-CM | POA: Diagnosis not present

## 2023-06-22 DIAGNOSIS — G250 Essential tremor: Secondary | ICD-10-CM | POA: Diagnosis not present

## 2023-06-22 DIAGNOSIS — E041 Nontoxic single thyroid nodule: Secondary | ICD-10-CM | POA: Diagnosis not present

## 2023-06-22 DIAGNOSIS — Z6841 Body Mass Index (BMI) 40.0 and over, adult: Secondary | ICD-10-CM | POA: Diagnosis not present

## 2023-06-22 DIAGNOSIS — K219 Gastro-esophageal reflux disease without esophagitis: Secondary | ICD-10-CM | POA: Diagnosis not present

## 2023-06-22 DIAGNOSIS — J4520 Mild intermittent asthma, uncomplicated: Secondary | ICD-10-CM | POA: Diagnosis not present

## 2023-06-22 DIAGNOSIS — I10 Essential (primary) hypertension: Secondary | ICD-10-CM | POA: Diagnosis not present

## 2023-06-22 DIAGNOSIS — E1169 Type 2 diabetes mellitus with other specified complication: Secondary | ICD-10-CM | POA: Diagnosis not present

## 2023-06-22 DIAGNOSIS — G629 Polyneuropathy, unspecified: Secondary | ICD-10-CM | POA: Diagnosis not present

## 2023-06-22 DIAGNOSIS — E7800 Pure hypercholesterolemia, unspecified: Secondary | ICD-10-CM | POA: Diagnosis not present

## 2023-07-05 ENCOUNTER — Ambulatory Visit: Payer: Commercial Managed Care - PPO | Admitting: Internal Medicine

## 2023-07-05 ENCOUNTER — Other Ambulatory Visit (HOSPITAL_COMMUNITY): Payer: Self-pay

## 2023-07-05 ENCOUNTER — Encounter: Payer: Self-pay | Admitting: Internal Medicine

## 2023-07-05 VITALS — BP 120/74 | HR 76 | Ht 64.0 in | Wt 240.6 lb

## 2023-07-05 DIAGNOSIS — E1165 Type 2 diabetes mellitus with hyperglycemia: Secondary | ICD-10-CM

## 2023-07-05 DIAGNOSIS — Z7984 Long term (current) use of oral hypoglycemic drugs: Secondary | ICD-10-CM | POA: Diagnosis not present

## 2023-07-05 DIAGNOSIS — Z7985 Long-term (current) use of injectable non-insulin antidiabetic drugs: Secondary | ICD-10-CM

## 2023-07-05 DIAGNOSIS — E0590 Thyrotoxicosis, unspecified without thyrotoxic crisis or storm: Secondary | ICD-10-CM | POA: Diagnosis not present

## 2023-07-05 DIAGNOSIS — E042 Nontoxic multinodular goiter: Secondary | ICD-10-CM

## 2023-07-05 MED ORDER — OZEMPIC (1 MG/DOSE) 4 MG/3ML ~~LOC~~ SOPN
1.0000 mg | PEN_INJECTOR | SUBCUTANEOUS | 3 refills | Status: AC
  Filled 2023-07-05 – 2023-07-29 (×4): qty 9, 84d supply, fill #0
  Filled 2023-10-18: qty 9, 84d supply, fill #1
  Filled 2024-01-15 – 2024-01-19 (×2): qty 9, 84d supply, fill #2

## 2023-07-05 NOTE — Progress Notes (Signed)
 Patient ID: Veronica Russell, female   DOB: 09-26-1960, 63 y.o.   MRN: [id number]   HPI  Veronica Russell is a 63 y.o.-year-old pleasant female, returning for follow-up for h/o subclinical hyperthyroidism, right thyroid  nodule, and DM2, non-insulin -dependent.  Last visit 5 months ago.  Interim history: She does have increased urination, but no blurry vision, nausea, chest pain.   She has positional tingling in hands and toes - B12 and TSH were normal.  Subclinical hyperthyroidism:  Patient was found to have a slightly low TSH at her first visit in the weight loss clinic in 01/2016. Her TFTs normalized after 2018.  Reviewed her TFTs: 06/22/2023: TSH 0.46 01/03/2023: TSH 0.78 12/29/2021: TSH 0.63 12/22/2020: TSH 0.65 Lab Results  Component Value Date   TSH 0.52 05/22/2020   TSH 0.57 09/27/2019   TSH 0.63 09/20/2018   TSH 0.50 10/05/2017   TSH 0.52 04/04/2017   TSH 0.436 (L) 09/29/2016   TSH 0.443 (L) 05/09/2016   TSH 0.425 (L) 01/27/2016   FREET4 0.83 05/22/2020   FREET4 0.94 09/27/2019   FREET4 0.80 09/20/2018   FREET4 0.75 10/05/2017   FREET4 0.75 04/04/2017   FREET4 1.17 09/29/2016   FREET4 1.24 05/09/2016   FREET4 1.17 01/27/2016    Lab Results  Component Value Date   T3FREE 3.7 05/22/2020   T3FREE 4.2 09/27/2019   T3FREE 3.4 09/20/2018   T3FREE 3.9 10/05/2017   T3FREE 3.7 04/04/2017    She has chronic: Hot flashes Palpitations with exertion Hair loss Hereditary tremors  Pt does have a FH of thyroid  ds. - M aunt. No FH of thyroid  cancer. No h/o radiation tx to head or neck. No herbal supplements. No Biotin use.  Right thyroid  nodule:  Reviewed previous investigation: 04/14/2017: Thyroid  ultrasound: 2.9 x 2 x 2.7 solid, hypoechoic, right thyroid  nodule 05/11/2017: FNA of the dominant thyroid  nodule: Benign 06/05/2019: Thyroid  ultrasound: Stable nodules 01/08/2021: Thyroid  ultrasound: Stable nodules, slightly enlarged left neck lymph node  02/25/2022: Thyroid   ultrasound: Stable nodule, no follow-up needed  Pt denies: - feeling nodules in neck - hoarseness - dysphagia - choking  DM2:  Reviewed HbA1c levels: 06/22/2023: HbA1c 7.2% Lab Results  Component Value Date   HGBA1C 6.8 (A) 01/26/2023   HGBA1C 6.6 (A) 09/30/2022   HGBA1C 6.6 (A) 05/27/2022   HGBA1C 6.4 (A) 01/21/2022   HGBA1C 6.7 (A) 09/21/2021   HGBA1C 7.4 (A) 05/25/2021   HGBA1C 7.6 (A) 01/20/2021   HGBA1C 6.6 (A) 09/25/2020   HGBA1C 7.1 (A) 05/22/2020   HGBA1C 7.3 (A) 01/17/2020   She is on: - Metformin  ER 1000 mg with dinner >> 1500 mg daily with dinner - Farxiga  5 >> 10 mg before b'fast -  >> Ozempic  1 mg weekly - Glipizide  5 mg after dinner! >> Moved to before dinner - occasionally, before larger meals  She checks her sugars twice a day : - am: 120-130s >> 113-138, 143, 160 >> 116-158, 165 >> 111-135, 143, 154 - 2h after b'fast: n/c - lunch: n/c >> 139 >> n/c - 2h after lunch: n/c - dinner: 110-152 >> 104-112 >> 94 >> 102 >> n/c - 2h after dinner: 130-160s, 187 >> 106-152, 166 >> 139 >> 91, 108-145, 167 - bedtime: n/c Lowest: 112 >> 111 >> 98 >> 91; hypoglycemia awareness in the 70s. Highest: 337 >> 232 (pizza) >> .Aaron [email or web address] >> 167  Meter: Accu-Chek  No CKD: 06/22/2023: BUN 10, GFR 97, Glu 108 Lab Results  Component Value Date   BUN 11  06/10/2019   Lab Results  Component Value Date   CREATININE 0.75 06/10/2019   ACR 2.22 on 01/03/2023 Lab Results  Component Value Date   MICRALBCREAT 22.4 12/29/2021   MICRALBCREAT 6.5 01/27/2016  On losartan  50 mg daily.  + HL:   Lab Results  Component Value Date   CHOL 173 06/10/2019   HDL 53 06/10/2019   LDLCALC 92 06/10/2019   TRIG 161 (H) 06/10/2019  On Lipitor 20.  Last eye exam: 01/04/2023: no DR, but hypertensive retinopathy - Dr. Princella [place].  Last foot exam 09/30/2022.  She also has a history of HTN, asthma, vitamin D  deficiency. Also, tremors >> had to decrease Propranolol  dose 2/2 wheezing. She also  has hair loss and sees Dr. Darlen Eglin, dermatologist at [facility].  She specializes in hair loss. 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  ROS: + see HPI  I reviewed pt's medications, allergies, PMH, social hx, family hx, and changes were documented in the history of present illness. Otherwise, unchanged from my initial visit note.  Past Medical History:  Diagnosis Date   Allergy    Alopecia    Asthma    Back pain    Blood transfusion without reported diagnosis    with hysterectomy    Diabetes (HCC)    Diabetes mellitus (HCC)    GERD (gastroesophageal reflux disease)    Glaucoma    forming but no treatment- close evaluation per eye md    Hyperlipidemia    Hypertension    Joint pain    Lactose intolerance    Obesity    Raynauds syndrome    Swelling    Tremor    Vitamin D  deficiency    Past Surgical History:  Procedure Laterality Date   ABDOMINAL HYSTERECTOMY     BREAST BIOPSY     COLONOSCOPY     MYOMECTOMY     POLYPECTOMY     Social History   Socioeconomic History   Marital status: Single    Spouse name: Not on file   Number of children: no  Social Needs  Occupational History   Occupation: Public relations account executive    Employer: [employer]  Tobacco Use   Smoking status: Never Smoker   Smokeless tobacco: Never Used  Substance and Sexual Activity   Alcohol  use:  Wine, 2-3 times a year, 1 drink   Drug use: No   Current Outpatient Medications on File Prior to Visit  Medication Sig Dispense Refill   albuterol  (VENTOLIN  HFA) 108 (90 Base) MCG/ACT inhaler Inhale 2 puffs into the lungs every 6 (six) hours as needed for wheezing or shortness of breath. 1 Inhaler 0   albuterol  (VENTOLIN  HFA) 108 (90 Base) MCG/ACT inhaler Inhale 1 puff into the lungs every 4 (four) hours as needed 18 g 0   albuterol  (VENTOLIN  HFA) 108 (90 Base) MCG/ACT inhaler Inhale 1 puff into the lungs every 4 (four) hours as needed. 6.7 g 0   Alcohol  Swabs  (ALCOHOL  WIPES) 70 % PADS 30 Packages by Does not apply route 2 (two) times daily. 100 each 0   amLODipine  (NORVASC ) 5 MG tablet Take 1 tablet (5 mg total) by mouth daily in the morning. 90 tablet 2   amLODipine  (NORVASC ) 5 MG tablet Take 1 tablet (5 mg total) by mouth daily. 90 tablet 0   amoxicillin -clavulanate (AUGMENTIN ) 875-125 MG tablet Take 1 tablet by mouth 2 (two) times daily. 14 tablet 0  atorvastatin  (LIPITOR) 20 MG tablet Take 1 tablet (20 mg total) by mouth daily. 90 tablet 1   benzonatate  (TESSALON  PERLES) 100 MG capsule Take 1-2 capsules (100-200 mg total) by mouth 3 (three) times daily as needed for cough 30 capsule 0   benzonatate  (TESSALON ) 200 MG capsule Take 1 capsule by mouth 3 times a day as  needed for cough 30 capsule 0   Blood Glucose Monitoring Suppl (FREESTYLE FREEDOM LITE) w/Device KIT Use to check blood sugars 1 kit 0   Cholecalciferol (VITAMIN D -3) 5000 UNIT/ML LIQD 1 tablet     COVID-19 mRNA bivalent vaccine, Pfizer, (PFIZER COVID-19 VAC BIVALENT) injection Inject into the muscle. 0.3 mL 0   dapagliflozin  propanediol (FARXIGA ) 10 MG TABS tablet Take 1 tablet (10 mg total) by mouth daily before breakfast. 90 tablet 3   empagliflozin  (JARDIANCE ) 10 MG TABS tablet Take 1 tablet (10 mg total) by mouth daily before breakfast. 90 tablet 3   fluticasone  (FLONASE ) 50 MCG/ACT nasal spray Use 2 sprays in both nostrils daily. 16 g 0   glipiZIDE  (GLUCOTROL ) 5 MG tablet Take 1-2 tablets (5-10 mg total) by mouth daily before supper. 180 tablet 3   glucose blood (FREESTYLE LITE) test strip Use to check blood sugar 2 times daily 60 each 11   hydrochlorothiazide  (HYDRODIURIL ) 25 MG tablet Take 1 tablet (25 mg total) by mouth every morning. 90 tablet 0   Lancets (FREESTYLE) lancets Use to check blood sugar 2 times a day. 200 each 12   losartan  (COZAAR ) 50 MG tablet Take 1 tablet (50 mg total) by mouth daily. 90 tablet 0   Melatonin 3 MG TABS Take 1 tablet by mouth at bedtime as  needed.     metFORMIN  (GLUCOPHAGE -XR) 500 MG 24 hr tablet Take 3 tablets (1,500 mg total) by mouth daily with supper. 270 tablet 3   montelukast  (SINGULAIR ) 10 MG tablet Take 1 tablet (10 mg total) by mouth daily in the evening. 90 tablet 2   montelukast  (SINGULAIR ) 10 MG tablet Take 1 tablet (10 mg total) by mouth daily. 90 tablet 1   nystatin  (MYCOSTATIN /NYSTOP ) powder Apply 1 Application topically to affected area 2 (two) times daily, for 14 days 60 g 1   omeprazole  (PRILOSEC) 20 MG capsule Take 1 capsule (20 mg total) by mouth daily at least 30 minutes before morning meal. 90 capsule 2   omeprazole  (PRILOSEC) 20 MG capsule Take 1 capsule (20 mg total) by mouth once daily 30 minutes before morning meal. 90 capsule 2   Potassium Chloride ER 20 MEQ TBCR      potassium chloride SA (KLOR-CON) 20 MEQ tablet TAKE 1 TABLET BY MOUTH ONCE DAILY WITH FOOD (Patient taking differently: Take by mouth daily. with food) 90 tablet 2   predniSONE  (DELTASONE ) 20 MG tablet Take 2 tablets (40 mg total) by mouth daily with breakfast. 10 tablet 0   propranolol  (INDERAL ) 20 MG tablet Take 1 tablet (20 mg total) by mouth daily. 90 tablet 1   Semaglutide , 1 MG/DOSE, (OZEMPIC , 1 MG/DOSE,) 4 MG/3ML SOPN Inject 1 mg into the skin once a week. 9 mL 3   No current facility-administered medications on file prior to visit.   Allergies  Allergen Reactions   Erythromycin Nausea Only   Milk-Related Compounds Other (See Comments)    Lactose intollerance   Tetanus-Diphth-Acell Pertussis Hives   Family History  Problem Relation Age of Onset   Multiple myeloma Mother    Hypertension Mother    Hyperlipidemia Mother  Cancer Mother        multiple Myeloma in remission    Obesity Mother    Hypertension Father    Hyperlipidemia Father    Cancer Father    Prostate cancer Father    Asthma Other    Hyperlipidemia Other    Hypertension Other    Cancer Other    COPD Other    Stroke Other    Breast cancer Maternal Aunt     Breast cancer Paternal Aunt    Breast cancer Maternal Aunt    Colon cancer Brother 51   Colon polyps Brother    Esophageal cancer Neg Hx    Rectal cancer Neg Hx    Stomach cancer Neg Hx    PE: BP 120/74   Pulse 76   Ht 5\' 4"  (1.626 m)   Wt 240 lb 9.6 oz (109.1 kg)   LMP 08/31/2008   BMI 41.30 kg/m  Wt Readings from Last 3 Encounters:  07/05/23 240 lb 9.6 oz (109.1 kg)  01/26/23 244 lb 3.2 oz (110.8 kg)  09/30/22 239 lb 9.6 oz (108.7 kg)   Constitutional: overweight, in NAD Eyes:EOMI, no exophthalmos ENT: no thyromegaly, no cervical lymphadenopathy Cardiovascular: RRR, No MRG Respiratory: CTA B Musculoskeletal: no deformities, Skin:  no rashes Neurological: + tremor with outstretched hands Diabetic Foot Exam - Simple   Simple Foot Form Diabetic Foot exam was performed with the following findings: Yes 07/05/2023 10:01 AM  Visual Inspection No deformities, no ulcerations, no other skin breakdown bilaterally: Yes Sensation Testing Intact to touch and monofilament testing bilaterally: Yes Pulse Check Posterior Tibialis and Dorsalis pulse intact bilaterally: Yes Comments    ASSESSMENT: 1.  Mild subclinical thyrotoxicosis  2.  Multiple thyroid  nodules Thyroid  U/S (04/14/2017): FINDINGS: Parenchymal Echotexture: Mildly heterogenous Isthmus: 0.5 cm Right lobe: 4.5 x 2.1 x 1.3 cm Left lobe: 4.3 x 1.4 x 1.6 cm _________________________________________________________  Nodule # 1: Location: Right; Inferior Maximum size: 2.9 cm; Other 2 dimensions: 2.0 x 2.1 cm Composition: solid/almost completely solid (2) Echogenicity: hypoechoic (2) Shape: not taller-than-wide (0) Margins: smooth (0) Echogenic foci: none (0) ACR TI-RADS total points: 4. ACR TI-RADS risk category: TR4 (4-6 points).  ACR TI-RADS recommendations: **Given size (>/= 1.5 cm) and appearance, fine needle aspiration of this moderately suspicious nodule should be considered based on TI-RADS  criteria.________________________________________________  Multiple other smaller nodules measure 0.7 cm or less in size and do not meet criteria for biopsy nor follow-up.  IMPRESSION: Right lower pole nodule 1 meets criteria for fine needle aspiration biopsy.   FNA: Adequacy Reason Satisfactory For Evaluation. Diagnosis THYROID , FINE NEEDLE ASPIRATION, RLP (SPECIMEN 1 OF 1,COLLECTED 05/11/17): CONSISTENT WITH BENIGN FOLLICULAR NODULE ([place] CATEGORY II). Windle Hatch MD Pathologist, Electronic Signature (Case signed 05/15/2017) Specimen Clinical Information Right Inferior, 2.9cm; Other 2 dimensions: 2.0 x 2.1cm, solid/almost completely solid, hypoechoic, TI-RADS total points - 4, Moderately suspicious nodule Source Thyroid , Fine Needle Aspiration, Right, RLP (Specimen 1 of 1, collected on 05/11/17)  Thyroid  U/S (06/05/2019): CLINICAL DATA:  Goiter. 63 year old female with a history of thyroid  nodules. The 2.9 cm right inferior thyroid  nodule was previously biopsied on 05/11/2017   Parenchymal Echotexture: Mildly heterogenous Isthmus: 0.4 cm Right lobe: 4.9 x 2.2 x 1.5 cm Left lobe: 4.0 x 1.6 x 1.3 cm _________________________________________________________   The previously biopsied nodule in the right inferior gland is unchanged at 2.9 x 2.2 x 2.1 cm. Additional small subcentimeter thyroid  nodules again noted scattered throughout the left gland. None of these nodules  demonstrates significant interval growth or change in morphology. These lesions do not meet criteria for biopsy or dedicated imaging surveillance.   IMPRESSION: No significant interval change in the size or appearance of the previously biopsied 2.9 cm nodule in the right inferior gland.   No new or suspicious thyroid  nodules identified.  Thyroid  U/S (01/08/2021):  Parenchymal Echotexture: Mildly heterogeneous                 Isthmus: 0.4 cm                 Right lobe: 5.1 x 1.8 x 2.1 cm                  Left lobe: 4.7 x 1.1 x 1.4 cm                 _________________________________________________________                 Estimated total number of nodules >/= 1 cm: 1                 Number of spongiform nodules >/= 2 cm not described below (TR1): 0                 Number of mixed cystic and solid nodules >/= 1.5 cm not described        below (TR2): 0                 _________________________________________________________                 The previously biopsied 2.5 cm solid hypoechoic right inferior        thyroid  nodule is unchanged in size from prior examination.                 Subcentimeter left thyroid  nodule does not meet criteria for imaging        follow-up or FNA.                 Prominent left neck lymph node measuring 2.5 x 1.1 x 0.9 cm is not        enlarged by imaging.                 IMPRESSION:        1. Previously biopsied dominant right thyroid  nodule is not        significantly changed in size. Please correlate with prior FNA        results.        2. Prominent left neck lymph node is not enlarged by imaging        criteria. It is likely reactive, however continued clinical        surveillance is recommended. If there is evidence of enlargement        over time, repeat imaging should be considered.     Thyroid  U/S (02/25/2022): Parenchymal Echotexture: Mildly heterogenous  Isthmus: 0.5 cm  Right lobe: 0.1 cm x 2.7 cm x 1.3 cm  Left lobe: 4.5 cm x 1.4 cm x 1.4 cm  _________________________________________________________   Estimated total number of nodules >/= 1 cm: 1 _________________________________________________________   Nodule labeled 1 inferior right thyroid , 2.9 cm, unchanged in size. Nodule was previously biopsied. Assuming benign result, no further specific follow-up would be indicated.   Nodules labeled 2, 3, 4 in the left thyroid  are unchanged, none of which meet criteria for continued surveillance.   No adenopathy   Recommendations  follow  those established by the new ACR TI-RADS criteria (J Am Coll Radiol 2017;14:587-595).   IMPRESSION: Unchanged appearance over time of heterogeneous multinodular thyroid  as above.  3. DM2, non-insulin -dependent  PLAN:  1. Patient with history of subclinical hyperthyroidism with possible thyrotoxic symptoms:: Hot flashes, palpitations, hair loss. - TSH was normal at last check on 06/22/2023: 0.46 per review of [facility] records  2.  Multiple thyroid  nodules - No neck compression symptoms - She had a solid 2.9 cm right thyroid  nodule per review of the thyroid  ultrasound from 03/2017.  A biopsy of the nodule was benign in 05/2017.  Repeat thyroid  ultrasound in 06/2019 showed that this nodule remained stable.  She also has several smaller nodules, not worrisome.  She had another neck ultrasound checked by PCP in 12/2020 (in the [place] system) of which I could reviewed the report, but not the images.  The right nodule measured 2.5 cm and appeared stable.  She had a slightly enlarged left cervical lymph node, likely reactive.  She had a new ultrasound in 01/2022 and this did not show any lymphadenopathy.  The nodules were stable and did not meet criteria for intervention or further imaging follow-up. - Will continue to follow her clinically for now  3. DM2 -Patient with history of uncontrolled diabetes, with improved control especially after switching from Victoza  to Ozempic .  She also continues metformin , SGLT2 inhibitor and prn sulfonylurea before a large dinner.  At last visit, sugars were slightly higher than before especially in the morning.  They were even higher during the holidays.  She was not checking them later in the day and I advised her to start.  Since the HbA1c was higher (6.8%), I recommended to increase the Farxiga  dose.  She tolerates well the higher dose.  She does have increased urination and is trying to stay hydrated. - Patient had another HbA1c obtained 06/22/2023 and this was  even higher, at 7.2%. However, reviewing her blood sugars at home, these are actually slightly improved from before, with the vast majority of them being at goal.  I did not suggest a change in regimen for now.  I refilled her Ozempic .  I also suggested to check her test strips at home, to make sure they have not expired. - I suggested to: Patient Instructions  Please continue: - Metformin  ER 1500 mg with dinner - Glipizide  5 mg before a larger dinner - Ozempic  1 mg weekly - Farxiga  10 mg before b'fast  Please return in 4 months with your sugar log.   - advised to check sugars at different times of the day - 1x a day, rotating check times - advised for yearly eye exams >> she is UTD - return to clinic in 6-8 months  Emilie Harden, MD PhD [facility] Endocrinology

## 2023-07-05 NOTE — Patient Instructions (Addendum)
 Please continue: - Metformin  ER 1500 mg with dinner - Glipizide  5 mg before a larger dinner - Ozempic  1 mg weekly - Farxiga  10 mg before b'fast  Please return in 4 months with your sugar log.

## 2023-07-21 ENCOUNTER — Other Ambulatory Visit (HOSPITAL_COMMUNITY): Payer: Self-pay

## 2023-07-21 ENCOUNTER — Other Ambulatory Visit: Payer: Self-pay

## 2023-07-26 ENCOUNTER — Other Ambulatory Visit (HOSPITAL_COMMUNITY): Payer: Self-pay

## 2023-07-29 ENCOUNTER — Other Ambulatory Visit (HOSPITAL_COMMUNITY): Payer: Self-pay

## 2023-07-31 ENCOUNTER — Other Ambulatory Visit (HOSPITAL_COMMUNITY): Payer: Self-pay

## 2023-08-26 ENCOUNTER — Other Ambulatory Visit (HOSPITAL_COMMUNITY): Payer: Self-pay

## 2023-08-28 ENCOUNTER — Other Ambulatory Visit (HOSPITAL_BASED_OUTPATIENT_CLINIC_OR_DEPARTMENT_OTHER): Payer: Self-pay

## 2023-08-28 ENCOUNTER — Other Ambulatory Visit: Payer: Self-pay

## 2023-08-28 ENCOUNTER — Other Ambulatory Visit (HOSPITAL_COMMUNITY): Payer: Self-pay

## 2023-08-28 MED ORDER — PROPRANOLOL HCL 20 MG PO TABS
20.0000 mg | ORAL_TABLET | Freq: Every day | ORAL | 1 refills | Status: DC
  Filled 2023-08-28: qty 90, 90d supply, fill #0
  Filled 2023-11-27: qty 90, 90d supply, fill #1

## 2023-08-29 ENCOUNTER — Other Ambulatory Visit (HOSPITAL_COMMUNITY): Payer: Self-pay

## 2023-08-29 MED ORDER — HYDROCHLOROTHIAZIDE 25 MG PO TABS
25.0000 mg | ORAL_TABLET | Freq: Every day | ORAL | 1 refills | Status: AC
  Filled 2023-08-29: qty 90, 90d supply, fill #0
  Filled 2024-02-20: qty 90, 90d supply, fill #1

## 2023-08-29 MED ORDER — OMEPRAZOLE 20 MG PO CPDR
20.0000 mg | DELAYED_RELEASE_CAPSULE | Freq: Every morning | ORAL | 1 refills | Status: DC
  Filled 2023-08-29: qty 90, 90d supply, fill #0
  Filled 2023-11-27: qty 90, 90d supply, fill #1

## 2023-08-29 MED ORDER — AMLODIPINE BESYLATE 5 MG PO TABS
5.0000 mg | ORAL_TABLET | Freq: Every day | ORAL | 1 refills | Status: DC
  Filled 2023-08-29: qty 90, 90d supply, fill #0
  Filled 2023-11-27: qty 90, 90d supply, fill #1

## 2023-08-29 MED ORDER — MONTELUKAST SODIUM 10 MG PO TABS
10.0000 mg | ORAL_TABLET | Freq: Every day | ORAL | 1 refills | Status: DC
  Filled 2023-08-29: qty 90, 90d supply, fill #0
  Filled 2023-11-27: qty 90, 90d supply, fill #1

## 2023-08-29 MED ORDER — LOSARTAN POTASSIUM 50 MG PO TABS
50.0000 mg | ORAL_TABLET | Freq: Every day | ORAL | 1 refills | Status: DC
  Filled 2023-08-29: qty 90, 90d supply, fill #0
  Filled 2023-11-27: qty 90, 90d supply, fill #1

## 2023-09-26 ENCOUNTER — Other Ambulatory Visit (HOSPITAL_COMMUNITY): Payer: Self-pay

## 2023-09-29 ENCOUNTER — Other Ambulatory Visit (HOSPITAL_COMMUNITY): Payer: Self-pay

## 2023-09-29 ENCOUNTER — Other Ambulatory Visit: Payer: Self-pay

## 2023-10-03 ENCOUNTER — Other Ambulatory Visit (HOSPITAL_COMMUNITY): Payer: Self-pay

## 2023-10-03 MED ORDER — ZOSTER VAC RECOMB ADJUVANTED 50 MCG/0.5ML IM SUSR
0.5000 mL | Freq: Once | INTRAMUSCULAR | 0 refills | Status: AC
  Filled 2023-12-04: qty 0.5, 1d supply, fill #0

## 2023-10-03 MED ORDER — ZOSTER VAC RECOMB ADJUVANTED 50 MCG/0.5ML IM SUSR
INTRAMUSCULAR | 0 refills | Status: DC
  Filled 2023-10-03: qty 1, 1d supply, fill #0

## 2023-10-13 ENCOUNTER — Other Ambulatory Visit (HOSPITAL_COMMUNITY): Payer: Self-pay

## 2023-10-21 ENCOUNTER — Other Ambulatory Visit (HOSPITAL_COMMUNITY): Payer: Self-pay

## 2023-10-27 ENCOUNTER — Other Ambulatory Visit: Payer: Self-pay | Admitting: Family Medicine

## 2023-10-27 DIAGNOSIS — Z1231 Encounter for screening mammogram for malignant neoplasm of breast: Secondary | ICD-10-CM

## 2023-11-06 ENCOUNTER — Other Ambulatory Visit: Payer: Self-pay

## 2023-11-06 ENCOUNTER — Other Ambulatory Visit (HOSPITAL_COMMUNITY): Payer: Self-pay

## 2023-11-06 ENCOUNTER — Other Ambulatory Visit

## 2023-11-06 ENCOUNTER — Encounter: Payer: Self-pay | Admitting: Internal Medicine

## 2023-11-06 ENCOUNTER — Ambulatory Visit: Admitting: Internal Medicine

## 2023-11-06 VITALS — BP 122/70 | HR 94 | Ht 64.0 in | Wt 245.2 lb

## 2023-11-06 DIAGNOSIS — E0590 Thyrotoxicosis, unspecified without thyrotoxic crisis or storm: Secondary | ICD-10-CM | POA: Diagnosis not present

## 2023-11-06 DIAGNOSIS — Z7985 Long-term (current) use of injectable non-insulin antidiabetic drugs: Secondary | ICD-10-CM | POA: Diagnosis not present

## 2023-11-06 DIAGNOSIS — E1165 Type 2 diabetes mellitus with hyperglycemia: Secondary | ICD-10-CM | POA: Diagnosis not present

## 2023-11-06 DIAGNOSIS — E042 Nontoxic multinodular goiter: Secondary | ICD-10-CM | POA: Diagnosis not present

## 2023-11-06 DIAGNOSIS — Z7984 Long term (current) use of oral hypoglycemic drugs: Secondary | ICD-10-CM

## 2023-11-06 LAB — POCT GLYCOSYLATED HEMOGLOBIN (HGB A1C): Hemoglobin A1C: 7 % — AB (ref 4.0–5.6)

## 2023-11-06 MED ORDER — GLIPIZIDE 5 MG PO TABS
5.0000 mg | ORAL_TABLET | Freq: Every day | ORAL | 3 refills | Status: AC
  Filled 2023-11-06: qty 180, 90d supply, fill #0

## 2023-11-06 MED ORDER — METFORMIN HCL ER 500 MG PO TB24
1500.0000 mg | ORAL_TABLET | Freq: Every day | ORAL | 3 refills | Status: AC
  Filled 2023-11-06: qty 270, 90d supply, fill #0
  Filled 2024-02-20: qty 270, 90d supply, fill #1

## 2023-11-06 MED ORDER — DAPAGLIFLOZIN PROPANEDIOL 10 MG PO TABS
10.0000 mg | ORAL_TABLET | Freq: Every day | ORAL | 3 refills | Status: AC
  Filled 2023-11-06 – 2024-01-19 (×3): qty 90, 90d supply, fill #0

## 2023-11-06 NOTE — Progress Notes (Signed)
 Patient ID: Veronica Russell, female   DOB: [birth year], 63 y.o.   MRN: [id number]   HPI  Veronica Russell is a 63 y.o.-year-old pleasant female, returning for follow-up for h/o subclinical hyperthyroidism, right thyroid  nodule, and DM2, non-insulin -dependent.  Last visit 5 months ago.  Interim history: No increased urination, blurry vision, nausea, chest pain.   She is walking on the treadmill every other day.  Subclinical hyperthyroidism:  Patient was found to have a slightly low TSH at her first visit in the weight loss clinic in 01/2016. Her TFTs normalized after 2018.  Reviewed her TFTs: 06/22/2023: TSH 0.46 01/03/2023: TSH 0.78 12/29/2021: TSH 0.63 12/22/2020: TSH 0.65 Lab Results  Component Value Date   TSH 0.52 05/22/2020   TSH 0.57 09/27/2019   TSH 0.63 09/20/2018   TSH 0.50 10/05/2017   TSH 0.52 04/04/2017   TSH 0.436 (L) 09/29/2016   TSH 0.443 (L) 05/09/2016   TSH 0.425 (L) 01/27/2016   FREET4 0.83 05/22/2020   FREET4 0.94 09/27/2019   FREET4 0.80 09/20/2018   FREET4 0.75 10/05/2017   FREET4 0.75 04/04/2017   FREET4 1.17 09/29/2016   FREET4 1.24 05/09/2016   FREET4 1.17 01/27/2016    Lab Results  Component Value Date   T3FREE 3.7 05/22/2020   T3FREE 4.2 09/27/2019   T3FREE 3.4 09/20/2018   T3FREE 3.9 10/05/2017   T3FREE 3.7 04/04/2017    She has chronic: Hot flashes Palpitations with exertion Hair loss Hereditary tremors  Pt does have a FH of thyroid  ds. - M aunt. No FH of thyroid  cancer. No h/o radiation tx to head or neck. No herbal supplements. No Biotin use.  Right thyroid  nodule:  Reviewed previous investigation: 04/14/2017: Thyroid  ultrasound: 2.9 x 2 x 2.7 solid, hypoechoic, right thyroid  nodule 05/11/2017: FNA of the dominant thyroid  nodule: Benign 06/05/2019: Thyroid  ultrasound: Stable nodules 01/08/2021: Thyroid  ultrasound: Stable nodules, slightly enlarged left neck lymph node  02/25/2022: Thyroid  ultrasound: Stable nodule, no follow-up  needed  Pt denies: - feeling nodules in neck - hoarseness - dysphagia - choking  DM2:  Reviewed HbA1c levels: 07/17/2023: HbA1c 7.2% 06/22/2023: HbA1c 7.2% Lab Results  Component Value Date   HGBA1C 6.8 (A) 01/26/2023   HGBA1C 6.6 (A) 09/30/2022   HGBA1C 6.6 (A) 05/27/2022   HGBA1C 6.4 (A) 01/21/2022   HGBA1C 6.7 (A) 09/21/2021   HGBA1C 7.4 (A) 05/25/2021   HGBA1C 7.6 (A) 01/20/2021   HGBA1C 6.6 (A) 09/25/2020   HGBA1C 7.1 (A) 05/22/2020   HGBA1C 7.3 (A) 01/17/2020   She is on: - Metformin  ER 1000 mg with dinner >> 1500 mg daily with dinner - Farxiga  5 >> 10 mg before b'fast -  >> Ozempic  1 mg weekly - Glipizide  5 mg after dinner! >> Moved to before dinner - occasionally, before larger meals  She checks her sugars twice a day : - am: 113-138, 143, 160 >> 116-158, 165 >> 111-135, 143, 154 >> 119-153, 171 - 2h after b'fast: n/c - lunch: n/c >> 139 >> n/c >> 118 - 2h after lunch: n/c - dinner: 110-152 >> 104-112 >> 94 >> 102 >> n/c >> 93, 96 - 2h after dinner: 106-152, 166 >> 139 >> 91, 108-145, 167 >> 111-198 - bedtime: n/c Lowest: 112 >> 111 >> 98 >> 91 >> 94; hypoglycemia awareness in the 70s. Highest: 337 >> 232 (pizza) >> .[id number] >> 167 >> 55 (birthday).  Meter: Accu-Chek  No CKD: 06/22/2023: BUN 10, GFR 97, Glu 108 Lab Results  Component Value Date  BUN 11 06/10/2019   Lab Results  Component Value Date   CREATININE 0.75 06/10/2019   ACR 2.22 on 01/03/2023 Lab Results  Component Value Date   MICRALBCREAT 22.4 12/29/2021   MICRALBCREAT 6.5 01/27/2016  On losartan  50 mg daily.  + HL:   Lab Results  Component Value Date   CHOL 173 06/10/2019   HDL 53 06/10/2019   LDLCALC 92 06/10/2019   TRIG 161 (H) 06/10/2019  On Lipitor 20.  Last eye exam: 01/04/2023: no DR, but hypertensive retinopathy - Dr. Ginnie Pinal.  Last foot exam 07/05/2023.  She also has a history of HTN, asthma, vitamin D  deficiency. Also, tremors >> had to decrease Propranolol  dose  2/2 wheezing. She also has hair loss and sees Dr. Greig Blower, dermatologist at [facility].  She specializes in hair loss. 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  ROS: + see HPI  I reviewed pt's medications, allergies, PMH, social hx, family hx, and changes were documented in the history of present illness. Otherwise, unchanged from my initial visit note.  Past Medical History:  Diagnosis Date   Allergy    Alopecia    Asthma    Back pain    Blood transfusion without reported diagnosis    with hysterectomy    Diabetes (HCC)    Diabetes mellitus (HCC)    GERD (gastroesophageal reflux disease)    Glaucoma    forming but no treatment- close evaluation per eye md    Hyperlipidemia    Hypertension    Joint pain    Lactose intolerance    Obesity    Raynauds syndrome    Swelling    Tremor    Vitamin D  deficiency    Past Surgical History:  Procedure Laterality Date   ABDOMINAL HYSTERECTOMY     BREAST BIOPSY     COLONOSCOPY     MYOMECTOMY     POLYPECTOMY     Social History   Socioeconomic History   Marital status: Single    Spouse name: Not on file   Number of children: no  Social Needs  Occupational History   Occupation: Public relations account executive    Employer: [employer]  Tobacco Use   Smoking status: Never Smoker   Smokeless tobacco: Never Used  Substance and Sexual Activity   Alcohol  use:  Wine, 2-3 times a year, 1 drink   Drug use: No   Current Outpatient Medications on File Prior to Visit  Medication Sig Dispense Refill   albuterol  (VENTOLIN  HFA) 108 (90 Base) MCG/ACT inhaler Inhale 2 puffs into the lungs every 6 (six) hours as needed for wheezing or shortness of breath. 1 Inhaler 0   Alcohol  Swabs (ALCOHOL  WIPES) 70 % PADS 30 Packages by Does not apply route 2 (two) times daily. 100 each 0   amLODipine  (NORVASC ) 5 MG tablet Take 1 tablet (5 mg total) by mouth daily in the morning. 90 tablet 2   amLODipine  (NORVASC ) 5 MG  tablet Take 1 tablet (5 mg total) by mouth daily. 90 tablet 1   atorvastatin  (LIPITOR) 20 MG tablet Take 1 tablet (20 mg total) by mouth daily. 90 tablet 1   Blood Glucose Monitoring Suppl (FREESTYLE FREEDOM LITE) w/Device KIT Use to check blood sugars 1 kit 0   Cholecalciferol (VITAMIN D -3) 5000 UNIT/ML LIQD 1 tablet     COVID-19 mRNA bivalent vaccine, Pfizer, (PFIZER COVID-19 VAC BIVALENT) injection Inject into the muscle. 0.3 mL 0  dapagliflozin  propanediol (FARXIGA ) 10 MG TABS tablet Take 1 tablet (10 mg total) by mouth daily before breakfast. 90 tablet 3   fluticasone  (FLONASE ) 50 MCG/ACT nasal spray Use 2 sprays in both nostrils daily. 16 g 0   glipiZIDE  (GLUCOTROL ) 5 MG tablet Take 1-2 tablets (5-10 mg total) by mouth daily before supper. 180 tablet 3   glucose blood (FREESTYLE LITE) test strip Use to check blood sugar 2 times daily 60 each 11   hydrochlorothiazide  (HYDRODIURIL ) 25 MG tablet Take 1 tablet (25 mg total) by mouth every morning. 90 tablet 0   hydrochlorothiazide  (HYDRODIURIL ) 25 MG tablet Take 1 tablet (25 mg total) by mouth daily. 90 tablet 1   Lancets (FREESTYLE) lancets Use to check blood sugar 2 times a day. 200 each 12   losartan  (COZAAR ) 50 MG tablet Take 1 tablet (50 mg total) by mouth daily. 90 tablet 1   Melatonin 3 MG TABS Take 1 tablet by mouth at bedtime as needed.     metFORMIN  (GLUCOPHAGE -XR) 500 MG 24 hr tablet Take 3 tablets (1,500 mg total) by mouth daily with supper. 270 tablet 3   montelukast  (SINGULAIR ) 10 MG tablet Take 1 tablet (10 mg total) by mouth daily in the evening. 90 tablet 2   montelukast  (SINGULAIR ) 10 MG tablet Take 1 tablet (10 mg total) by mouth daily. 90 tablet 1   nystatin  (MYCOSTATIN /NYSTOP ) powder Apply 1 Application topically to affected area 2 (two) times daily, for 14 days 60 g 1   omeprazole  (PRILOSEC) 20 MG capsule Take 1 capsule (20 mg total) by mouth daily at least 30 minutes before morning meal. 90 capsule 2   omeprazole   (PRILOSEC) 20 MG capsule Take 1 capsule (20 mg total) by mouth in the morning 30 minutes before morning meal. 90 capsule 1   Potassium Chloride ER 20 MEQ TBCR  (Patient not taking: Reported on 07/05/2023)     potassium chloride SA (KLOR-CON) 20 MEQ tablet TAKE 1 TABLET BY MOUTH ONCE DAILY WITH FOOD (Patient taking differently: Take by mouth daily. with food) 90 tablet 2   predniSONE  (DELTASONE ) 20 MG tablet Take 2 tablets (40 mg total) by mouth daily with breakfast. (Patient not taking: Reported on 07/05/2023) 10 tablet 0   propranolol  (INDERAL ) 20 MG tablet Take 1 tablet (20 mg total) by mouth daily. 90 tablet 1   Semaglutide , 1 MG/DOSE, (OZEMPIC , 1 MG/DOSE,) 4 MG/3ML SOPN Inject 1 mg into the skin once a week. 9 mL 3   No current facility-administered medications on file prior to visit.   Allergies  Allergen Reactions   Erythromycin Nausea Only   Milk-Related Compounds Other (See Comments)    Lactose intollerance   Tetanus-Diphth-Acell Pertussis Hives   Family History  Problem Relation Age of Onset   Multiple myeloma Mother    Hypertension Mother    Hyperlipidemia Mother    Cancer Mother        multiple Myeloma in remission    Obesity Mother    Hypertension Father    Hyperlipidemia Father    Cancer Father    Prostate cancer Father    Asthma Other    Hyperlipidemia Other    Hypertension Other    Cancer Other    COPD Other    Stroke Other    Breast cancer Maternal Aunt    Breast cancer Paternal Aunt    Breast cancer Maternal Aunt    Colon cancer Brother 53   Colon polyps Brother    Esophageal cancer  Neg Hx    Rectal cancer Neg Hx    Stomach cancer Neg Hx    PE: BP 122/70   Pulse 94   Ht 5' 4 (1.626 m)   Wt 245 lb 3.2 oz (111.2 kg)   LMP 08/31/2008   BMI 42.09 kg/m  Wt Readings from Last 3 Encounters:  11/06/23 245 lb 3.2 oz (111.2 kg)  07/05/23 240 lb 9.6 oz (109.1 kg)  01/26/23 244 lb 3.2 oz (110.8 kg)   Constitutional: overweight, in NAD Eyes:EOMI, no  exophthalmos ENT: no thyromegaly, no cervical lymphadenopathy Cardiovascular: Tachycardia, RR, No MRG Respiratory: CTA B Musculoskeletal: no deformities, Skin:  no rashes Neurological: + tremor with outstretched hands  ASSESSMENT: 1.  Mild subclinical thyrotoxicosis  2.  Multiple thyroid  nodules Thyroid  U/S (04/14/2017): FINDINGS: Parenchymal Echotexture: Mildly heterogenous Isthmus: 0.5 cm Right lobe: 4.5 x 2.1 x 1.3 cm Left lobe: 4.3 x 1.4 x 1.6 cm _________________________________________________________  Nodule # 1: Location: Right; Inferior Maximum size: 2.9 cm; Other 2 dimensions: 2.0 x 2.1 cm Composition: solid/almost completely solid (2) Echogenicity: hypoechoic (2) Shape: not taller-than-wide (0) Margins: smooth (0) Echogenic foci: none (0) ACR TI-RADS total points: 4. ACR TI-RADS risk category: TR4 (4-6 points).  ACR TI-RADS recommendations: **Given size (>/= 1.5 cm) and appearance, fine needle aspiration of this moderately suspicious nodule should be considered based on TI-RADS criteria.________________________________________________  Multiple other smaller nodules measure 0.7 cm or less in size and do not meet criteria for biopsy nor follow-up.  IMPRESSION: Right lower pole nodule 1 meets criteria for fine needle aspiration biopsy.   FNA: Adequacy Reason Satisfactory For Evaluation. Diagnosis THYROID , FINE NEEDLE ASPIRATION, RLP (SPECIMEN 1 OF 1,COLLECTED 05/11/17): CONSISTENT WITH BENIGN FOLLICULAR NODULE (B[place] CATEGORY II). FONDA STARKS MD Pathologist, Electronic Signature (Case signed 05/15/2017) Specimen Clinical Information Right Inferior, 2.9cm; Other 2 dimensions: 2.0 x 2.1cm, solid/almost completely solid, hypoechoic, TI-RADS total points - 4, Moderately suspicious nodule Source Thyroid , Fine Needle Aspiration, Right, RLP (Specimen 1 of 1, collected on 05/11/17)  Thyroid  U/S (06/05/2019): CLINICAL DATA:  Goiter. 62 year old female with a  history of thyroid  nodules. The 2.9 cm right inferior thyroid  nodule was previously biopsied on 05/11/2017   Parenchymal Echotexture: Mildly heterogenous Isthmus: 0.4 cm Right lobe: 4.9 x 2.2 x 1.5 cm Left lobe: 4.0 x 1.6 x 1.3 cm _________________________________________________________   The previously biopsied nodule in the right inferior gland is unchanged at 2.9 x 2.2 x 2.1 cm. Additional small subcentimeter thyroid  nodules again noted scattered throughout the left gland. None of these nodules demonstrates significant interval growth or change in morphology. These lesions do not meet criteria for biopsy or dedicated imaging surveillance.   IMPRESSION: No significant interval change in the size or appearance of the previously biopsied 2.9 cm nodule in the right inferior gland.   No new or suspicious thyroid  nodules identified.  Thyroid  U/S (01/08/2021):  Parenchymal Echotexture: Mildly heterogeneous                 Isthmus: 0.4 cm                 Right lobe: 5.1 x 1.8 x 2.1 cm                 Left lobe: 4.7 x 1.1 x 1.4 cm                 _________________________________________________________                 Estimated total number  of nodules >/= 1 cm: 1                 Number of spongiform nodules >/= 2 cm not described below (TR1): 0                 Number of mixed cystic and solid nodules >/= 1.5 cm not described        below (TR2): 0                 _________________________________________________________                 The previously biopsied 2.5 cm solid hypoechoic right inferior        thyroid  nodule is unchanged in size from prior examination.                 Subcentimeter left thyroid  nodule does not meet criteria for imaging        follow-up or FNA.                 Prominent left neck lymph node measuring 2.5 x 1.1 x 0.9 cm is not        enlarged by imaging.                 IMPRESSION:        1. Previously biopsied dominant right thyroid  nodule is  not        significantly changed in size. Please correlate with prior FNA        results.        2. Prominent left neck lymph node is not enlarged by imaging        criteria. It is likely reactive, however continued clinical        surveillance is recommended. If there is evidence of enlargement        over time, repeat imaging should be considered.     Thyroid  U/S (02/25/2022): Parenchymal Echotexture: Mildly heterogenous  Isthmus: 0.5 cm  Right lobe: 0.1 cm x 2.7 cm x 1.3 cm  Left lobe: 4.5 cm x 1.4 cm x 1.4 cm  _________________________________________________________   Estimated total number of nodules >/= 1 cm: 1 _________________________________________________________   Nodule labeled 1 inferior right thyroid , 2.9 cm, unchanged in size. Nodule was previously biopsied. Assuming benign result, no further specific follow-up would be indicated.   Nodules labeled 2, 3, 4 in the left thyroid  are unchanged, none of which meet criteria for continued surveillance.   No adenopathy   Recommendations follow those established by the new ACR TI-RADS criteria (J Am Coll Radiol 2017;14:587-595).   IMPRESSION: Unchanged appearance over time of heterogeneous multinodular thyroid  as above.  3. DM2, non-insulin -dependent  PLAN:  1. Patient with history of subclinical hyperthyroidism with possible thyrotoxic symptoms: Hot flashes, palpitations, hair loss. - Latest TSH was normal at last check in 06/2023 per review of [facility] records  2.  Multiple thyroid  nodules - She denies neck compression symptoms -She has a history of a solid 2.9 cm right thyroid  nodule on the ultrasound from 03/2017.  A biopsy of this nodule was benign in 05/2017.  A repeat thyroid  ultrasound in 06/2019 showed that this nodule remained stable.  She had several other smaller nodules, not worrisome.  PCP checked another neck ultrasound in 12/2020 (in the [facility] system), for which I could not review the images, however,  per reviewing the report, the right nodule measured 2.5 cm and appeared stable.  She had a slightly enlarged  left cervical lymph node, likely reactive.  Latest thyroid  ultrasound is from 01/2022 and this did not show any lymphadenopathy.  The nodules were stable and did not meet criteria for intervention or further imaging follow-up. - We will continue to follow her clinically for now  3. DM2 -Patient with history of uncontrolled diabetes, with improved control particularly after switching from Victoza  to Ozempic .  She also continues metformin , SGLT2 inhibitor and sulfonylurea (prn before a larger dinner).  At last visit, HbA1c was 7.2%, slightly higher, however, at last visit sugars appears to be slightly improved from before, with the majority of values at goal.  I did not suggest a change in regimen.  I did suggest to check her test trips at home to make sure that they were not expired.  She had another HbA1c obtained 1 month after last value, in 07/2023, and this was stable, at 7.2%. - At today's visit, sugars appear to be slightly higher than before, at or above target in the morning and at bedtime.  She does mention that she relaxed her diet recently and is interested in improving this.  She would like a referral to nutrition (placed today).  Otherwise, I did not suggest a change in regimen for now.  I refilled her metformin , glipizide , and Farxiga . - I suggested to: Patient Instructions  Please continue: - Metformin  ER 1500 mg with dinner - Glipizide  5 mg before a larger dinner - Ozempic  1 mg weekly - Farxiga  10 mg before b'fast  Please return in 4 months with your sugar log.   - we checked her HbA1c: 7.0% (lower) - advised to check sugars at different times of the day - 1x a day, rotating check times - advised for yearly eye exams >> she is UTD - return to clinic in 4 months  Lela Fendt, MD PhD [facility] Endocr[facility]

## 2023-11-06 NOTE — Patient Instructions (Signed)
 Please continue: - Metformin  ER 1500 mg with dinner - Glipizide  5 mg before a larger dinner - Ozempic  1 mg weekly - Farxiga  10 mg before b'fast  Please return in 4 months with your sugar log.

## 2023-11-06 NOTE — Addendum Note (Signed)
 Addended by: CLEOTILDE ROLIN RAMAN on: 11/06/2023 09:18 AM   Modules accepted: Orders

## 2023-11-07 ENCOUNTER — Ambulatory Visit: Payer: Self-pay | Admitting: Internal Medicine

## 2023-11-07 LAB — MICROALBUMIN / CREATININE URINE RATIO
Creatinine, Urine: 70 mg/dL (ref 20–275)
Microalb Creat Ratio: 26 mg/g{creat} (ref <30)
Microalb, Ur: 1.8 mg/dL

## 2023-11-08 ENCOUNTER — Other Ambulatory Visit (HOSPITAL_COMMUNITY): Payer: Self-pay

## 2023-11-20 ENCOUNTER — Other Ambulatory Visit: Payer: Self-pay

## 2023-11-20 ENCOUNTER — Other Ambulatory Visit (HOSPITAL_COMMUNITY): Payer: Self-pay

## 2023-11-21 ENCOUNTER — Encounter: Payer: Self-pay | Admitting: Internal Medicine

## 2023-11-22 ENCOUNTER — Other Ambulatory Visit (HOSPITAL_COMMUNITY): Payer: Self-pay

## 2023-11-22 MED ORDER — HYDROCHLOROTHIAZIDE 25 MG PO TABS
25.0000 mg | ORAL_TABLET | Freq: Every morning | ORAL | 0 refills | Status: DC
  Filled 2023-11-22: qty 90, 90d supply, fill #0

## 2023-11-22 MED ORDER — ATORVASTATIN CALCIUM 20 MG PO TABS
20.0000 mg | ORAL_TABLET | Freq: Every day | ORAL | 0 refills | Status: DC
  Filled 2023-11-22: qty 90, 90d supply, fill #0

## 2023-11-27 ENCOUNTER — Other Ambulatory Visit (HOSPITAL_COMMUNITY): Payer: Self-pay

## 2023-12-04 ENCOUNTER — Other Ambulatory Visit (HOSPITAL_COMMUNITY): Payer: Self-pay

## 2023-12-08 ENCOUNTER — Ambulatory Visit
Admission: RE | Admit: 2023-12-08 | Discharge: 2023-12-08 | Disposition: A | Discharge status: Home / Self Care | Source: Ambulatory Visit | Attending: Family Medicine | Admitting: Family Medicine

## 2023-12-08 DIAGNOSIS — Z1231 Encounter for screening mammogram for malignant neoplasm of breast: Secondary | ICD-10-CM

## 2023-12-12 ENCOUNTER — Encounter: Attending: Internal Medicine | Admitting: Dietician

## 2023-12-12 ENCOUNTER — Encounter: Payer: Self-pay | Admitting: Dietician

## 2023-12-12 DIAGNOSIS — E1165 Type 2 diabetes mellitus with hyperglycemia: Secondary | ICD-10-CM | POA: Insufficient documentation

## 2023-12-12 NOTE — Patient Instructions (Signed)
 Goals Established by Patient:   Goal 1: Cook dinner at home twice a week.

## 2023-12-12 NOTE — Progress Notes (Signed)
 Diabetes Self-Management Education  Visit Type: First/Initial  Appt. Start Time: 0845 Appt. End Time: 0945  12/12/2023  Veronica Russell, identified by name and date of birth, is a 63 y.o. female with a diagnosis of Diabetes: Type 2.   ASSESSMENT  History includes: allergies, asthma, type 2 diabetes, GERD, glaucoma, HLD, HTN Labs noted: 11/06/23: A1c 7.0% Medications include: reviewed; farxiga , glipizide , metformin , semaglutide  Supplements: vitamin d3  Pt reports she works at [employer]. Pt states she typically eats breakfast/lunch from [employer]. Pt reports lunch options are not the best.   Pt states she had diabetes education in the past (2013-2014).   Pt states things have been more stressful in the last year, with the passing of her mom and one of her close friends.   Pt reports one of her coworkers and her started going to the gym on lunch every other day aout a month ago and walking on the treadmill for 30 minutes. Pt reports she tried water aerobics in the past and enjoyed it, but was no longer able to go for free.   Pt reports going to sleep at 1am and waking around 5:30/6am.   Pt reports she eats out most nights for dinner.   Last menstrual period 08/31/2008. There is no height or weight on file to calculate BMI.   Diabetes Self-Management Education - 12/11/23 1154       Visit Information   Visit Type First/Initial      Initial Visit   Diabetes Type Type 2      Psychosocial Assessment   Patient Belief/Attitude about Diabetes Motivated to manage diabetes    What is the hardest part about your diabetes right now, causing you the most concern, or is the most worrisome to you about your diabetes?   Making healty food and beverage choices    Self-care barriers None    Self-management support Doctor's office    Other persons present Patient    Patient Concerns Nutrition/Meal planning    Special Needs None    Preferred Learning Style No preference  indicated    Learning Readiness Ready    How often do you need to have someone help you when you read instructions, pamphlets, or other written materials from your doctor or pharmacy? 1 - Never      Pre-Education Assessment   Patient understands the diabetes disease and treatment process. Needs Instruction    Patient understands incorporating nutritional management into lifestyle. Needs Instruction    Patient undertands incorporating physical activity into lifestyle. Needs Instruction    Patient understands using medications safely. Needs Instruction    Patient understands monitoring blood glucose, interpreting and using results Needs Instruction    Patient understands prevention, detection, and treatment of acute complications. Needs Instruction    Patient understands prevention, detection, and treatment of chronic complications. Needs Instruction    Patient understands how to develop strategies to address psychosocial issues. Needs Instruction    Patient understands how to develop strategies to promote health/change behavior. Needs Instruction      Complications   Last HgB A1C per patient/outside source 7 %    How often do you check your blood sugar? 1-2 times/day      Dietary Intake   Breakfast oatmeal    Snack (morning) fruit    Lunch pork loin and chips    Snack (afternoon) none    Dinner arbys sandwich and some fries    Snack (evening) none    Beverage(s) water, 4-8  oz coke      Activity / Exercise   Activity / Exercise Type Light (walking / raking leaves)      Patient Education   Disease Pathophysiology Explored patient's options for treatment of their diabetes;Definition of diabetes, type 1 and 2, and the diagnosis of diabetes;Factors that contribute to the development of diabetes    Healthy Eating Role of diet in the treatment of diabetes and the relationship between the three main macronutrients and blood glucose level;Plate Method;Information on hints to eating out and  maintain blood glucose control.;Meal options for control of blood glucose level and chronic complications.    Being Active Role of exercise on diabetes management, blood pressure control and cardiac health.;Helped patient identify appropriate exercises in relation to his/her diabetes, diabetes complications and other health issue.    Medications Reviewed patients medication for diabetes, action, purpose, timing of dose and side effects.    Monitoring Identified appropriate SMBG and/or A1C goals.    Chronic complications Relationship between chronic complications and blood glucose control;Identified and discussed with patient  current chronic complications    Diabetes Stress and Support Identified and addressed patients feelings and concerns about diabetes;Role of stress on diabetes;Worked with patient to identify barriers to care and solutions    Lifestyle and Health Coping Lifestyle issues that need to be addressed for better diabetes care      Individualized Goals (developed by patient)   Nutrition General guidelines for healthy choices and portions discussed    Physical Activity Exercise 3-5 times per week;30 minutes per day    Medications take my medication as prescribed    Monitoring  Test my blood glucose as discussed    Problem Solving Eating Pattern    Reducing Risk examine blood glucose patterns;do foot checks daily;treat hypoglycemia with 15 grams of carbs if blood glucose less than 70mg /dL    Health Coping Ask for help with psychological, social, or emotional issues      Post-Education Assessment   Patient understands the diabetes disease and treatment process. Comprehends key points    Patient understands incorporating nutritional management into lifestyle. Comprehends key points    Patient undertands incorporating physical activity into lifestyle. Comprehends key points    Patient understands using medications safely. Comphrehends key points    Patient understands monitoring blood  glucose, interpreting and using results Comprehends key points    Patient understands prevention, detection, and treatment of acute complications. Comprehends key points    Patient understands prevention, detection, and treatment of chronic complications. Comprehends key points    Patient understands how to develop strategies to address psychosocial issues. Comprehends key points    Patient understands how to develop strategies to promote health/change behavior. Comprehends key points      Outcomes   Expected Outcomes Demonstrated interest in learning. Expect positive outcomes    Future DMSE 4-6 wks    Program Status Not Completed          Individualized Plan for Diabetes Self-Management Training:   Learning Objective:  Patient will have a greater understanding of diabetes self-management. Patient education plan is to attend individual and/or group sessions per assessed needs and concerns.   Plan:   Patient Instructions  Goals Established by Patient:   Goal 1: Cook dinner at home twice a week.   Expected Outcomes:  Demonstrated interest in learning. Expect positive outcomes  Education material provided: My Plate and Snack sheet  If problems or questions, patient to contact team via:  Phone  Future DSME  appointment: 4-6 wks

## 2023-12-14 ENCOUNTER — Other Ambulatory Visit: Payer: Self-pay | Admitting: Medical Genetics

## 2023-12-26 ENCOUNTER — Ambulatory Visit (AMBULATORY_SURGERY_CENTER)

## 2023-12-26 ENCOUNTER — Other Ambulatory Visit (HOSPITAL_COMMUNITY): Payer: Self-pay

## 2023-12-26 VITALS — Ht 64.0 in | Wt 245.8 lb

## 2023-12-26 DIAGNOSIS — Z860100 Personal history of colon polyps, unspecified: Secondary | ICD-10-CM

## 2023-12-26 MED ORDER — NA SULFATE-K SULFATE-MG SULF 17.5-3.13-1.6 GM/177ML PO SOLN
1.0000 | Freq: Once | ORAL | 0 refills | Status: AC
  Filled 2023-12-26: qty 354, 1d supply, fill #0

## 2023-12-26 NOTE — Progress Notes (Signed)
 No egg or soy allergy known to patient  No issues known to pt with past sedation with any surgeries or procedures Patient denies ever being told they had issues or difficulty with intubation  No FH of Malignant Hyperthermia Pt is not on diet pills Pt is not on  home 02  Pt is not on blood thinners  Pt denies issues with constipation  No A fib or A flutter Have any cardiac testing pending--NO Pt can ambulate- Independently Pt denies use of chewing tobacco Discussed diabetic I weight loss medication holds Discussed NSAID holds Checked BMI Pt instructed to use [employer] or GoodRx for a price reduction on prep  Patient's chart reviewed by Cathlyn Parsons CNRA prior to previsit and patient appropriate for the [facility].  Pre visit completed and red dot placed by patient's name on their procedure day (on provider's schedule).

## 2023-12-27 ENCOUNTER — Other Ambulatory Visit (HOSPITAL_COMMUNITY): Payer: Self-pay

## 2024-01-01 ENCOUNTER — Other Ambulatory Visit (HOSPITAL_COMMUNITY): Payer: Self-pay

## 2024-01-02 ENCOUNTER — Encounter: Payer: Self-pay | Admitting: Internal Medicine

## 2024-01-05 ENCOUNTER — Other Ambulatory Visit (HOSPITAL_COMMUNITY): Payer: Self-pay

## 2024-01-05 DIAGNOSIS — E559 Vitamin D deficiency, unspecified: Secondary | ICD-10-CM | POA: Diagnosis not present

## 2024-01-05 DIAGNOSIS — E7800 Pure hypercholesterolemia, unspecified: Secondary | ICD-10-CM | POA: Diagnosis not present

## 2024-01-05 DIAGNOSIS — E66813 Obesity, class 3: Secondary | ICD-10-CM | POA: Diagnosis not present

## 2024-01-05 DIAGNOSIS — M25562 Pain in left knee: Secondary | ICD-10-CM | POA: Diagnosis not present

## 2024-01-05 DIAGNOSIS — Z0000 Encounter for general adult medical examination without abnormal findings: Secondary | ICD-10-CM | POA: Diagnosis not present

## 2024-01-05 DIAGNOSIS — E1165 Type 2 diabetes mellitus with hyperglycemia: Secondary | ICD-10-CM | POA: Diagnosis not present

## 2024-01-05 DIAGNOSIS — I10 Essential (primary) hypertension: Secondary | ICD-10-CM | POA: Diagnosis not present

## 2024-01-05 MED ORDER — MELOXICAM 7.5 MG PO TABS
7.5000 mg | ORAL_TABLET | Freq: Every day | ORAL | 0 refills | Status: AC
  Filled 2024-01-05: qty 5, 5d supply, fill #0

## 2024-01-05 MED ORDER — ALBUTEROL SULFATE HFA 108 (90 BASE) MCG/ACT IN AERS
2.0000 | INHALATION_SPRAY | RESPIRATORY_TRACT | 1 refills | Status: DC
  Filled 2024-01-05: qty 6.7, 17d supply, fill #0
  Filled 2024-01-17: qty 6.7, 17d supply, fill #1

## 2024-01-09 ENCOUNTER — Encounter: Payer: Self-pay | Admitting: Internal Medicine

## 2024-01-09 ENCOUNTER — Ambulatory Visit: Admitting: Internal Medicine

## 2024-01-09 VITALS — BP 119/67 | HR 95 | Temp 97.7°F | Resp 11 | Ht 64.0 in | Wt 245.8 lb

## 2024-01-09 DIAGNOSIS — I10 Essential (primary) hypertension: Secondary | ICD-10-CM | POA: Diagnosis not present

## 2024-01-09 DIAGNOSIS — Z800 Family history of malignant neoplasm of digestive organs: Secondary | ICD-10-CM | POA: Diagnosis not present

## 2024-01-09 DIAGNOSIS — K5730 Diverticulosis of large intestine without perforation or abscess without bleeding: Secondary | ICD-10-CM | POA: Diagnosis not present

## 2024-01-09 DIAGNOSIS — E669 Obesity, unspecified: Secondary | ICD-10-CM | POA: Diagnosis not present

## 2024-01-09 DIAGNOSIS — E119 Type 2 diabetes mellitus without complications: Secondary | ICD-10-CM | POA: Diagnosis not present

## 2024-01-09 DIAGNOSIS — Z1211 Encounter for screening for malignant neoplasm of colon: Secondary | ICD-10-CM | POA: Diagnosis not present

## 2024-01-09 DIAGNOSIS — D127 Benign neoplasm of rectosigmoid junction: Secondary | ICD-10-CM

## 2024-01-09 DIAGNOSIS — D124 Benign neoplasm of descending colon: Secondary | ICD-10-CM

## 2024-01-09 DIAGNOSIS — K648 Other hemorrhoids: Secondary | ICD-10-CM | POA: Diagnosis not present

## 2024-01-09 DIAGNOSIS — Z860100 Personal history of colon polyps, unspecified: Secondary | ICD-10-CM

## 2024-01-09 DIAGNOSIS — E785 Hyperlipidemia, unspecified: Secondary | ICD-10-CM | POA: Diagnosis not present

## 2024-01-09 DIAGNOSIS — Z860101 Personal history of adenomatous and serrated colon polyps: Secondary | ICD-10-CM | POA: Diagnosis not present

## 2024-01-09 DIAGNOSIS — K6389 Other specified diseases of intestine: Secondary | ICD-10-CM | POA: Diagnosis not present

## 2024-01-09 DIAGNOSIS — K635 Polyp of colon: Secondary | ICD-10-CM | POA: Diagnosis not present

## 2024-01-09 MED ORDER — SODIUM CHLORIDE 0.9 % IV SOLN: 500.0000 mL | Freq: Once | INTRAVENOUS | Status: AC

## 2024-01-09 NOTE — Progress Notes (Signed)
 Called to room to assist during endoscopic procedure.  Patient ID and intended procedure confirmed with present staff. Received instructions for my participation in the procedure from the performing physician.

## 2024-01-09 NOTE — Progress Notes (Signed)
 GASTROENTEROLOGY PROCEDURE H&P NOTE   Primary Care Physician: Loretha Richerd SAUNDERS, MD    Reason for Procedure:  History of multiple colon polyps and family history of colon cancer  Plan:    Surveillance colonoscopy  Patient is appropriate for endoscopic procedure(s) in the ambulatory ([facility]) setting.  The nature of the procedure, as well as the risks, benefits, and alternatives were carefully and thoroughly reviewed with the patient. Ample time for discussion and questions allowed.  All questions were answered. The patient understood, was satisfied, and agreed with the plan to proceed.    HPI: Veronica Russell is a 63 y.o. female who presents for colonoscopy.  Medical history as below.  Tolerated the prep.  No recent chest pain or shortness of breath.  No abdominal pain today.  Past Medical History:  Diagnosis Date   Allergy    Alopecia    Asthma    Back pain    Blood transfusion without reported diagnosis    with hysterectomy    Diabetes (HCC)    Diabetes mellitus (HCC)    GERD (gastroesophageal reflux disease)    Glaucoma    forming but no treatment- close evaluation per eye md    Hyperlipidemia    Hypertension    Joint pain    Lactose intolerance    Obesity    Raynauds syndrome    Swelling    Tremor    Vitamin D  deficiency     Past Surgical History:  Procedure Laterality Date   ABDOMINAL HYSTERECTOMY     BREAST BIOPSY     COLONOSCOPY     MYOMECTOMY     POLYPECTOMY      Prior to Admission medications   Medication Sig Start Date End Date Taking? Authorizing Provider  Alcohol  Swabs (ALCOHOL  WIPES) 70 % PADS 30 Packages by Does not apply route 2 (two) times daily. 05/30/17  Yes Elvie Begun M, PA-C  amLODipine  (NORVASC ) 5 MG tablet Take 1 tablet (5 mg total) by mouth daily in the morning. 03/10/21  Yes   atorvastatin  (LIPITOR) 20 MG tablet Take 1 tablet (20 mg total) by mouth daily. 05/24/23  Yes   Blood Glucose Monitoring Suppl (FREESTYLE FREEDOM LITE) w/Device  KIT Use to check blood sugars 02/27/19  Yes Trixie File, MD  Cholecalciferol (VITAMIN D -3) 5000 UNIT/ML LIQD 1 tablet   Yes [provider]  dapagliflozin  propanediol (FARXIGA ) 10 MG TABS tablet Take 1 tablet (10 mg total) by mouth daily before breakfast. 11/06/23  Yes Trixie File, MD  glucose blood (FREESTYLE LITE) test strip Use to check blood sugar 2 times daily 05/25/23  Yes Trixie File, MD  hydrochlorothiazide  (HYDRODIURIL ) 25 MG tablet Take 1 tablet (25 mg total) by mouth every morning. 05/24/23  Yes   Lancets (FREESTYLE) lancets Use to check blood sugar 2 times a day. 02/27/19  Yes Trixie File, MD  losartan  (COZAAR ) 50 MG tablet Take 1 tablet (50 mg total) by mouth daily. 08/29/23  Yes   metFORMIN  (GLUCOPHAGE -XR) 500 MG 24 hr tablet Take 3 tablets (1,500 mg total) by mouth daily with supper. 11/06/23  Yes Trixie File, MD  montelukast  (SINGULAIR ) 10 MG tablet Take 1 tablet (10 mg total) by mouth daily in the evening. 03/10/21  Yes   omeprazole  (PRILOSEC) 20 MG capsule Take 1 capsule (20 mg total) by mouth daily at least 30 minutes before morning meal. 03/10/21  Yes   albuterol  (VENTOLIN  HFA) 108 (90 Base) MCG/ACT inhaler Inhale 2 puffs into the lungs every  6 (six) hours as needed for wheezing or shortness of breath. 03/12/18   Starla Grate D, PA-C  albuterol  (VENTOLIN  HFA) 108 (90 Base) MCG/ACT inhaler [place] 2 puffs into the lungs as needed for wheezing every 4-6 hrs. 01/05/24     amLODipine  (NORVASC ) 5 MG tablet Take 1 tablet (5 mg total) by mouth daily. Patient not taking: Reported on 12/26/2023 08/29/23     atorvastatin  (LIPITOR) 20 MG tablet Take 1 tablet (20 mg total) by mouth daily. Patient not taking: Reported on 12/26/2023 11/22/23     COVID-19 mRNA bivalent vaccine, Pfizer, (PFIZER COVID-19 VAC BIVALENT) injection Inject into the muscle. Patient not taking: Reported on 12/26/2023 11/20/20   Luiz Channel, MD  fluticasone  (FLONASE ) 50 MCG/ACT nasal  spray Use 2 sprays in both nostrils daily. Patient not taking: Reported on 12/26/2023 01/27/21   Muthersbaugh, Chiquita, PA-C  glipiZIDE  (GLUCOTROL ) 5 MG tablet Take 1-2 tablets (5-10 mg total) by mouth daily before supper. 11/06/23   Trixie File, MD  hydrochlorothiazide  (HYDRODIURIL ) 25 MG tablet Take 1 tablet (25 mg total) by mouth daily. Patient not taking: Reported on 12/26/2023 08/29/23     hydrochlorothiazide  (HYDRODIURIL ) 25 MG tablet Take 1 tablet (25 mg total) by mouth in the morning. Patient not taking: Reported on 12/26/2023 11/22/23     Melatonin 3 MG TABS Take 1 tablet by mouth at bedtime as needed.    [provider]  meloxicam  (MOBIC ) 7.5 MG tablet Take 1 tablet (7.5 mg total) by mouth daily. 01/05/24     montelukast  (SINGULAIR ) 10 MG tablet Take 1 tablet (10 mg total) by mouth daily. Patient not taking: Reported on 12/26/2023 08/29/23     nystatin  (MYCOSTATIN /NYSTOP ) powder Apply 1 Application topically to affected area 2 (two) times daily, for 14 days 08/15/22     omeprazole  (PRILOSEC) 20 MG capsule Take 1 capsule (20 mg total) by mouth in the morning 30 minutes before morning meal. Patient not taking: Reported on 12/26/2023 08/29/23     Potassium Chloride ER 20 MEQ TBCR  06/10/19   [provider]  potassium chloride SA (KLOR-CON) 20 MEQ tablet TAKE 1 TABLET BY MOUTH ONCE DAILY WITH FOOD Patient not taking: Reported on 12/26/2023 06/10/19 11/06/23  Rankins, Richerd SAUNDERS, MD  propranolol  (INDERAL ) 20 MG tablet Take 1 tablet (20 mg total) by mouth daily. Patient not taking: Reported on 01/09/2024 08/28/23     Semaglutide , 1 MG/DOSE, (OZEMPIC , 1 MG/DOSE,) 4 MG/3ML SOPN Inject 1 mg into the skin once a week. 07/05/23   Trixie File, MD    Current Outpatient Medications  Medication Sig Dispense Refill   Alcohol  Swabs (ALCOHOL  WIPES) 70 % PADS 30 Packages by Does not apply route 2 (two) times daily. 100 each 0   amLODipine  (NORVASC ) 5 MG tablet Take 1 tablet (5 mg  total) by mouth daily in the morning. 90 tablet 2   atorvastatin  (LIPITOR) 20 MG tablet Take 1 tablet (20 mg total) by mouth daily. 90 tablet 1   Blood Glucose Monitoring Suppl (FREESTYLE FREEDOM LITE) w/Device KIT Use to check blood sugars 1 kit 0   Cholecalciferol (VITAMIN D -3) 5000 UNIT/ML LIQD 1 tablet     dapagliflozin  propanediol (FARXIGA ) 10 MG TABS tablet Take 1 tablet (10 mg total) by mouth daily before breakfast. 90 tablet 3   glucose blood (FREESTYLE LITE) test strip Use to check blood sugar 2 times daily 60 each 11   hydrochlorothiazide  (HYDRODIURIL ) 25 MG tablet Take 1 tablet (25 mg total) by mouth every  morning. 90 tablet 0   Lancets (FREESTYLE) lancets Use to check blood sugar 2 times a day. 200 each 12   losartan  (COZAAR ) 50 MG tablet Take 1 tablet (50 mg total) by mouth daily. 90 tablet 1   metFORMIN  (GLUCOPHAGE -XR) 500 MG 24 hr tablet Take 3 tablets (1,500 mg total) by mouth daily with supper. 270 tablet 3   montelukast  (SINGULAIR ) 10 MG tablet Take 1 tablet (10 mg total) by mouth daily in the evening. 90 tablet 2   omeprazole  (PRILOSEC) 20 MG capsule Take 1 capsule (20 mg total) by mouth daily at least 30 minutes before morning meal. 90 capsule 2   albuterol  (VENTOLIN  HFA) 108 (90 Base) MCG/ACT inhaler Inhale 2 puffs into the lungs every 6 (six) hours as needed for wheezing or shortness of breath. 1 Inhaler 0   albuterol  (VENTOLIN  HFA) 108 (90 Base) MCG/ACT inhaler [place] 2 puffs into the lungs as needed for wheezing every 4-6 hrs. 6.7 g 1   amLODipine  (NORVASC ) 5 MG tablet Take 1 tablet (5 mg total) by mouth daily. (Patient not taking: Reported on 12/26/2023) 90 tablet 1   atorvastatin  (LIPITOR) 20 MG tablet Take 1 tablet (20 mg total) by mouth daily. (Patient not taking: Reported on 12/26/2023) 90 tablet 0   COVID-19 mRNA bivalent vaccine, Pfizer, (PFIZER COVID-19 VAC BIVALENT) injection Inject into the muscle. (Patient not taking: Reported on 12/26/2023) 0.3 mL 0   fluticasone   (FLONASE ) 50 MCG/ACT nasal spray Use 2 sprays in both nostrils daily. (Patient not taking: Reported on 12/26/2023) 16 g 0   glipiZIDE  (GLUCOTROL ) 5 MG tablet Take 1-2 tablets (5-10 mg total) by mouth daily before supper. 180 tablet 3   hydrochlorothiazide  (HYDRODIURIL ) 25 MG tablet Take 1 tablet (25 mg total) by mouth daily. (Patient not taking: Reported on 12/26/2023) 90 tablet 1   hydrochlorothiazide  (HYDRODIURIL ) 25 MG tablet Take 1 tablet (25 mg total) by mouth in the morning. (Patient not taking: Reported on 12/26/2023) 90 tablet 0   Melatonin 3 MG TABS Take 1 tablet by mouth at bedtime as needed.     meloxicam  (MOBIC ) 7.5 MG tablet Take 1 tablet (7.5 mg total) by mouth daily. 5 tablet 0   montelukast  (SINGULAIR ) 10 MG tablet Take 1 tablet (10 mg total) by mouth daily. (Patient not taking: Reported on 12/26/2023) 90 tablet 1   nystatin  (MYCOSTATIN /NYSTOP ) powder Apply 1 Application topically to affected area 2 (two) times daily, for 14 days 60 g 1   omeprazole  (PRILOSEC) 20 MG capsule Take 1 capsule (20 mg total) by mouth in the morning 30 minutes before morning meal. (Patient not taking: Reported on 12/26/2023) 90 capsule 1   Potassium Chloride ER 20 MEQ TBCR  (Patient not taking: Reported on 12/26/2023)     potassium chloride SA (KLOR-CON) 20 MEQ tablet TAKE 1 TABLET BY MOUTH ONCE DAILY WITH FOOD (Patient not taking: Reported on 12/26/2023) 90 tablet 2   propranolol  (INDERAL ) 20 MG tablet Take 1 tablet (20 mg total) by mouth daily. (Patient not taking: Reported on 01/09/2024) 90 tablet 1   Semaglutide , 1 MG/DOSE, (OZEMPIC , 1 MG/DOSE,) 4 MG/3ML SOPN Inject 1 mg into the skin once a week. 9 mL 3   Current Facility-Administered Medications  Medication Dose Route Frequency Provider Last Rate Last Admin   0.9 %  sodium chloride  infusion  500 mL Intravenous Once Suhaib Guzzo, Gordy HERO, MD        Allergies as of 01/09/2024 - Review Complete 01/09/2024  Allergen Reaction Noted  Erythromycin Nausea Only  03/15/2018   Milk-related compounds Other (See Comments) 08/08/2011   Tetanus-diphth-acell pertussis Hives 12/16/2019    Family History  Problem Relation Age of Onset   Multiple myeloma Mother    Hypertension Mother    Hyperlipidemia Mother    Cancer Mother        multiple Myeloma in remission    Obesity Mother    Hypertension Father    Hyperlipidemia Father    Cancer Father    Prostate cancer Father    Asthma Other    Hyperlipidemia Other    Hypertension Other    Cancer Other    COPD Other    Stroke Other    Breast cancer Maternal Aunt    Breast cancer Paternal Aunt    Breast cancer Maternal Aunt    Colon cancer Brother 69   Colon polyps Brother    Esophageal cancer Neg Hx    Rectal cancer Neg Hx    Stomach cancer Neg Hx     Social History   Socioeconomic History   Marital status: Single    Spouse name: Not on file   Number of children: Not on file   Years of education: Not on file   Highest education level: Not on file  Occupational History   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    Employer: [employer]  Tobacco Use   Smoking status: Never   Smokeless tobacco: Never  Vaping Use   Vaping status: Never Used  Substance and Sexual Activity   Alcohol  use: No   Drug use: No   Sexual activity: Not on file  Other Topics Concern   Not on file  Social History Narrative   Not on file   Social Drivers of Health   Financial Resource Strain: Not on file  Food Insecurity: Not on file  Transportation Needs: Not on file  Physical Activity: Insufficiently Active (10/03/2023)   Received from [facility]   Exercise Vital Sign    On average, how many days per week do you engage in moderate to strenuous exercise (like a brisk walk)?: 3 days    On average, how many minutes do you engage in exercise at this level?: 20 min  Stress: Not on file  Social Connections: Not on file  Intimate Partner Violence: Not on file    Physical Exam: Vital signs in last 24  hours: @BP  136/77   Pulse 90   Temp 97.7 F (36.5 C)   Ht 5' 4 (1.626 m)   Wt 245 lb 12.8 oz (111.5 kg)   LMP 08/31/2008   SpO2 94%   BMI 42.19 kg/m  GEN: NAD EYE: Sclerae anicteric ENT: MMM CV: Non-tachycardic Pulm: CTA b/l GI: Soft, NT/ND NEURO:  Alert & Oriented x 3   Gordy Starch, MD [facility]  01/09/2024 9:05 AM

## 2024-01-09 NOTE — Op Note (Signed)
 [facility] Patient Name: Veronica Russell Procedure Date: 01/09/2024 9:01 AM MRN: [id number] Endoscopist: Gordy CHRISTELLA Starch , MD, [id number] Age: 63 Referring MD:  Date of Birth: [birth year] Gender: Female Account #: [id number] Procedure:                Colonoscopy Indications:              High risk colon cancer surveillance: Personal                            history of adenomas with villous component, Family                            history of colon cancer in a first-degree relative                            before age 66 years, Last colonoscopy: March 2022                            (TVA x4, TA x 1), 2011 (TVA) Medicines:                Monitored Anesthesia Care Procedure:                Pre-Anesthesia Assessment:                           - Prior to the procedure, a History and Physical                            was performed, and patient medications and                            allergies were reviewed. The patient's tolerance of                            previous anesthesia was also reviewed. The risks                            and benefits of the procedure and the sedation                            options and risks were discussed with the patient.                            All questions were answered, and informed consent                            was obtained. Prior Anticoagulants: The patient has                            taken no anticoagulant or antiplatelet agents. ASA                            Grade Assessment: III - A patient with severe  systemic disease. After reviewing the risks and                            benefits, the patient was deemed in satisfactory                            condition to undergo the procedure.                           After obtaining informed consent, the colonoscope                            was passed under direct vision. Throughout the                            procedure, the patient's blood  pressure, pulse, and                            oxygen saturations were monitored continuously. The                            CF [id number] #[id number] was introduced through the anus                            and advanced to the cecum, identified by                            appendiceal orifice and ileocecal valve. The                            colonoscopy was performed without difficulty. The                            patient tolerated the procedure well. The quality                            of the bowel preparation was good. The ileocecal                            valve, appendiceal orifice, and rectum were                            photographed. Scope In: 9:11:31 AM Scope Out: 9:25:17 AM Scope Withdrawal Time: 0 hours 11 minutes 44 seconds  Total Procedure Duration: 0 hours 13 minutes 46 seconds  Findings:                 The digital rectal exam was normal.                           A 3 mm polyp was found in the descending colon. The                            polyp was sessile. The polyp was removed with a  cold snare. Resection and retrieval were complete.                           A 2 mm polyp was found in the recto-sigmoid colon.                            The polyp was sessile. The polyp was removed with a                            cold snare. Resection and retrieval were complete.                           Multiple large-mouthed, medium-mouthed and                            small-mouthed diverticula were found in the sigmoid                            colon, descending colon, transverse colon and                            ascending colon.                           Internal hemorrhoids were found during                            retroflexion. The hemorrhoids were small. Complications:            No immediate complications. Estimated Blood Loss:     Estimated blood loss was minimal. Impression:               - One 3 mm polyp in the descending  colon, removed                            with a cold snare. Resected and retrieved.                           - One 2 mm polyp at the recto-sigmoid colon,                            removed with a cold snare. Resected and retrieved.                           - Moderate diverticulosis in the sigmoid colon, in                            the descending colon, in the transverse colon and                            in the ascending colon.                           - Small internal hemorrhoids. Recommendation:           -  Patient has a contact number available for                            emergencies. The signs and symptoms of potential                            delayed complications were discussed with the                            patient. Return to normal activities tomorrow.                            Written discharge instructions were provided to the                            patient.                           - Resume previous diet.                           - Continue present medications.                           - Await pathology results.                           - Repeat colonoscopy is recommended for                            surveillance. The colonoscopy date will be                            determined after pathology results from today's                            exam become available for review. Gordy CHRISTELLA Starch, MD 01/09/2024 9:29:30 AM This report has been signed electronically.

## 2024-01-09 NOTE — Patient Instructions (Signed)
 Thank you for letting us  take care of your healthcare needs today. PLease see handouts given to you on Polyps, Diverticulosis and Hemorrhoids.    YOU HAD AN ENDOSCOPIC PROCEDURE TODAY AT THE [facility]:   Refer to the procedure report that was given to you for any specific questions about what was found during the examination.  If the procedure report does not answer your questions, please call your gastroenterologist to clarify.  If you requested that your care partner not be given the details of your procedure findings, then the procedure report has been included in a sealed envelope for you to review at your convenience later.  YOU SHOULD EXPECT: Some feelings of bloating in the abdomen. Passage of more gas than usual.  Walking can help get rid of the air that was put into your GI tract during the procedure and reduce the bloating. If you had a lower endoscopy (such as a colonoscopy or flexible sigmoidoscopy) you may notice spotting of blood in your stool or on the toilet paper. If you underwent a bowel prep for your procedure, you may not have a normal bowel movement for a few days.  Please Note:  You might notice some irritation and congestion in your nose or some drainage.  This is from the oxygen used during your procedure.  There is no need for concern and it should clear up in a day or so.  SYMPTOMS TO REPORT IMMEDIATELY:  Following lower endoscopy (colonoscopy or flexible sigmoidoscopy):  Excessive amounts of blood in the stool  Significant tenderness or worsening of abdominal pains  Swelling of the abdomen that is new, acute  Fever of 100F or higher   For urgent or emergent issues, a gastroenterologist can be reached at any hour by calling [phone]. Do not use MyChart messaging for urgent concerns.    DIET:  We do recommend a small meal at first, but then you may proceed to your regular diet.  Drink plenty of fluids but you should avoid alcoholic beverages for  24 hours.  ACTIVITY:  You should plan to take it easy for the rest of today and you should NOT DRIVE or use heavy machinery until tomorrow (because of the sedation medicines used during the test).    FOLLOW UP: Our staff will call the number listed on your records the next business day following your procedure.  We will call around 7:15- 8:00 am to check on you and address any questions or concerns that you may have regarding the information given to you following your procedure. If we do not reach you, we will leave a message.     If any biopsies were taken you will be contacted by phone or by letter within the next 1-3 weeks.  Please call us  at [phone] if you have not heard about the biopsies in 3 weeks.    SIGNATURES/CONFIDENTIALITY: You and/or your care partner have signed paperwork which will be entered into your electronic medical record.  These signatures attest to the fact that that the information above on your After Visit Summary has been reviewed and is understood.  Full responsibility of the confidentiality of this discharge information lies with you and/or your care-partner.

## 2024-01-09 NOTE — Progress Notes (Signed)
 To pacu, VSS. Report to [email or web address]

## 2024-01-09 NOTE — Progress Notes (Signed)
 Pt's states no medical or surgical changes since previsit or office visit.

## 2024-01-10 ENCOUNTER — Telehealth: Payer: Self-pay

## 2024-01-10 DIAGNOSIS — H5213 Myopia, bilateral: Secondary | ICD-10-CM | POA: Diagnosis not present

## 2024-01-10 NOTE — Telephone Encounter (Signed)
°  Follow up Call-     01/09/2024    8:08 AM  Call back number  Post procedure Call Back phone  # [phone]  Permission to leave phone message Yes     Patient questions:  Do you have a fever, pain , or abdominal swelling? No. Pain Score  0 *  Have you tolerated food without any problems? Yes.    Have you been able to return to your normal activities? Yes.    Do you have any questions about your discharge instructions: Diet   No. Medications  No. Follow up visit  No.  Do you have questions or concerns about your Care? No.  Actions: * If pain score is 4 or above: No action needed, pain <4.

## 2024-01-12 ENCOUNTER — Ambulatory Visit: Payer: Self-pay | Admitting: Internal Medicine

## 2024-01-12 LAB — SURGICAL PATHOLOGY

## 2024-01-15 ENCOUNTER — Other Ambulatory Visit (HOSPITAL_COMMUNITY): Payer: Self-pay

## 2024-01-16 ENCOUNTER — Other Ambulatory Visit (HOSPITAL_COMMUNITY): Payer: Self-pay

## 2024-01-17 ENCOUNTER — Encounter: Admitting: Dietician

## 2024-01-17 ENCOUNTER — Encounter: Payer: Self-pay | Admitting: Dietician

## 2024-01-17 DIAGNOSIS — E1165 Type 2 diabetes mellitus with hyperglycemia: Secondary | ICD-10-CM | POA: Insufficient documentation

## 2024-01-17 NOTE — Progress Notes (Signed)
 Diabetes Self-Management Education  Visit Type: Follow-up  Appt. Start Time: 1515 Appt. End Time: 1545  01/17/2024  Veronica Russell, identified by name and date of birth, is a 63 y.o. female with a diagnosis of Diabetes: Type 2.   ASSESSMENT  Employee Visit 2  History includes: allergies, asthma, type 2 diabetes, GERD, glaucoma, HLD, HTN Labs noted: 11/06/23: A1c 7.0% Medications include: reviewed; farxiga , glipizide , metformin , semaglutide  Supplements: vitamin d3   Pt reports she has not been going to the gym with her coworker because she hurt her knee.   Pt reports work has been more busy/stressful.   Pt reports she has been trying to cook more, typically cooking 1 time per week at home.   Pt reports she recently had a colonoscopy.   Assessment/Continuation of Previous Goals Established by Patient:    Goal 1: Cook dinner at home twice a week. - goal in progress, cooking 1x/wk, continue working toward goal!  Last menstrual period 08/31/2008. There is no height or weight on file to calculate BMI.   Diabetes Self-Management Education - 01/17/24 1508       Visit Information   Visit Type Follow-up      Initial Visit   Diabetes Type Type 2    Are you currently following a meal plan? No    Are you taking your medications as prescribed? Yes      Health Coping   How would you rate your overall health? Good      Psychosocial Assessment   Patient Belief/Attitude about Diabetes Motivated to manage diabetes    What is the hardest part about your diabetes right now, causing you the most concern, or is the most worrisome to you about your diabetes?   Making healty food and beverage choices    Self-care barriers None    Self-management support Doctor's office    Other persons present Patient    Patient Concerns Nutrition/Meal planning    Special Needs None    Preferred Learning Style No preference indicated    Learning Readiness Ready      Pre-Education Assessment    Patient understands the diabetes disease and treatment process. Needs Review    Patient understands incorporating nutritional management into lifestyle. Needs Review    Patient undertands incorporating physical activity into lifestyle. Needs Review    Patient understands using medications safely. Needs Review    Patient understands monitoring blood glucose, interpreting and using results Needs Review    Patient understands prevention, detection, and treatment of acute complications. Needs Review    Patient understands prevention, detection, and treatment of chronic complications. Needs Review    Patient understands how to develop strategies to address psychosocial issues. Needs Review    Patient understands how to develop strategies to promote health/change behavior. Needs Review      Complications   Last HgB A1C per patient/outside source 7 %    How often do you check your blood sugar? 1-2 times/day    Fasting Blood glucose range (mg/dL) 29-870    Postprandial Blood glucose range (mg/dL) 869-820;819-799    Have you had a dilated eye exam in the past 12 months? Yes    Have you had a dental exam in the past 12 months? Yes      Dietary Intake   Breakfast protein shake OR oatmeal    Snack (morning) apple    Lunch cafeteria OR chips and fruit    Snack (afternoon) none    Dinner spaghetti  Snack (evening) none    Beverage(s) 32 oz water      Activity / Exercise   Activity / Exercise Type ADL's      Patient Education   Previous Diabetes Education No    Disease Pathophysiology Explored patient's options for treatment of their diabetes;Factors that contribute to the development of diabetes    Healthy Eating Role of diet in the treatment of diabetes and the relationship between the three main macronutrients and blood glucose level;Plate Method;Meal options for control of blood glucose level and chronic complications.;Reviewed blood glucose goals for pre and post meals and how to evaluate  the patients' food intake on their blood glucose level.;Meal timing in regards to the patients' current diabetes medication.    Being Active Helped patient identify appropriate exercises in relation to his/her diabetes, diabetes complications and other health issue.    Medications Reviewed patients medication for diabetes, action, purpose, timing of dose and side effects.    Monitoring Purpose and frequency of SMBG.    Chronic complications Relationship between chronic complications and blood glucose control;Identified and discussed with patient  current chronic complications    Diabetes Stress and Support Identified and addressed patients feelings and concerns about diabetes    Lifestyle and Health Coping Lifestyle issues that need to be addressed for better diabetes care      Individualized Goals (developed by patient)   Nutrition General guidelines for healthy choices and portions discussed    Physical Activity Exercise 3-5 times per week;30 minutes per day    Medications take my medication as prescribed    Monitoring  Test my blood glucose as discussed    Problem Solving Eating Pattern    Reducing Risk examine blood glucose patterns;do foot checks daily;treat hypoglycemia with 15 grams of carbs if blood glucose less than 70mg /dL    Health Coping Ask for help with psychological, social, or emotional issues      Patient Self-Evaluation of Goals - Patient rates self as meeting previously set goals (% of time)   Nutrition 50 - 75 % (half of the time)    Physical Activity 50 - 75 % (half of the time)    Medications >75% (most of the time)    Monitoring >75% (most of the time)    Problem Solving and behavior change strategies  50 - 75 % (half of the time)    Reducing Risk (treating acute and chronic complications) 50 - 75 % (half of the time)    Health Coping 50 - 75 % (half of the time)      Post-Education Assessment   Patient understands the diabetes disease and treatment process.  Comprehends key points    Patient understands incorporating nutritional management into lifestyle. Comprehends key points    Patient undertands incorporating physical activity into lifestyle. Comprehends key points    Patient understands using medications safely. Comphrehends key points    Patient understands monitoring blood glucose, interpreting and using results Comprehends key points    Patient understands prevention, detection, and treatment of acute complications. Comprehends key points    Patient understands prevention, detection, and treatment of chronic complications. Comprehends key points    Patient understands how to develop strategies to address psychosocial issues. Comprehends key points    Patient understands how to develop strategies to promote health/change behavior. Comprehends key points      Outcomes   Expected Outcomes Demonstrated interest in learning. Expect positive outcomes    Future DMSE 2 months  Program Status Not Completed      Subsequent Visit   Since your last visit have you continued or begun to take your medications as prescribed? Yes          Individualized Plan for Diabetes Self-Management Training:   Learning Objective:  Patient will have a greater understanding of diabetes self-management. Patient education plan is to attend individual and/or group sessions per assessed needs and concerns.   Plan:   There are no Patient Instructions on file for this visit.  Expected Outcomes:  Demonstrated interest in learning. Expect positive outcomes  Education material provided: none today  If problems or questions, patient to contact team via:  Phone  Future DSME appointment: 2 months

## 2024-01-18 ENCOUNTER — Other Ambulatory Visit (HOSPITAL_COMMUNITY): Payer: Self-pay

## 2024-01-19 ENCOUNTER — Other Ambulatory Visit: Payer: Self-pay

## 2024-01-19 ENCOUNTER — Other Ambulatory Visit (HOSPITAL_COMMUNITY)
Admission: RE | Admit: 2024-01-19 | Discharge: 2024-01-19 | Disposition: A | Discharge status: Home / Self Care | Payer: Self-pay | Source: Ambulatory Visit | Attending: Medical Genetics | Admitting: Medical Genetics

## 2024-01-19 ENCOUNTER — Other Ambulatory Visit (HOSPITAL_COMMUNITY): Payer: Self-pay

## 2024-01-30 LAB — GENECONNECT MOLECULAR SCREEN: Genetic Analysis Overall Interpretation: NEGATIVE

## 2024-02-05 ENCOUNTER — Other Ambulatory Visit: Payer: Self-pay

## 2024-02-20 ENCOUNTER — Encounter: Payer: Self-pay | Admitting: Pharmacist

## 2024-02-20 ENCOUNTER — Other Ambulatory Visit (HOSPITAL_COMMUNITY): Payer: Self-pay

## 2024-02-20 ENCOUNTER — Other Ambulatory Visit: Payer: Self-pay

## 2024-02-20 MED ORDER — MONTELUKAST SODIUM 10 MG PO TABS
10.0000 mg | ORAL_TABLET | Freq: Every day | ORAL | 1 refills | Status: AC
  Filled 2024-02-20: qty 90, 90d supply, fill #0

## 2024-02-20 MED ORDER — LOSARTAN POTASSIUM 50 MG PO TABS
50.0000 mg | ORAL_TABLET | Freq: Every day | ORAL | 0 refills | Status: AC
  Filled 2024-02-20: qty 90, 90d supply, fill #0

## 2024-02-20 MED ORDER — AMLODIPINE BESYLATE 5 MG PO TABS
5.0000 mg | ORAL_TABLET | Freq: Every day | ORAL | 1 refills | Status: AC
  Filled 2024-02-20: qty 90, 90d supply, fill #0

## 2024-02-20 MED ORDER — PROPRANOLOL HCL 20 MG PO TABS
20.0000 mg | ORAL_TABLET | Freq: Every day | ORAL | 1 refills | Status: AC
  Filled 2024-02-20: qty 90, 90d supply, fill #0

## 2024-02-20 MED ORDER — ALBUTEROL SULFATE HFA 108 (90 BASE) MCG/ACT IN AERS
2.0000 | INHALATION_SPRAY | RESPIRATORY_TRACT | 0 refills | Status: AC | PRN
  Filled 2024-02-20: qty 6.7, 20d supply, fill #0

## 2024-02-20 MED ORDER — ATORVASTATIN CALCIUM 20 MG PO TABS
20.0000 mg | ORAL_TABLET | Freq: Every day | ORAL | 1 refills | Status: AC
  Filled 2024-02-20: qty 90, 90d supply, fill #0

## 2024-02-20 MED ORDER — HYDROCHLOROTHIAZIDE 25 MG PO TABS
25.0000 mg | ORAL_TABLET | Freq: Every morning | ORAL | 0 refills | Status: AC
  Filled 2024-02-20: qty 90, 90d supply, fill #0

## 2024-02-20 MED ORDER — OMEPRAZOLE 20 MG PO CPDR
20.0000 mg | DELAYED_RELEASE_CAPSULE | Freq: Every morning | ORAL | 0 refills | Status: AC
  Filled 2024-02-20: qty 90, 90d supply, fill #0

## 2024-02-21 ENCOUNTER — Other Ambulatory Visit (HOSPITAL_COMMUNITY): Payer: Self-pay

## 2024-02-28 ENCOUNTER — Encounter: Payer: Self-pay | Admitting: Dietician

## 2024-02-28 ENCOUNTER — Encounter: Payer: Self-pay | Attending: Internal Medicine | Admitting: Dietician

## 2024-02-28 DIAGNOSIS — E1165 Type 2 diabetes mellitus with hyperglycemia: Secondary | ICD-10-CM | POA: Insufficient documentation

## 2024-02-28 NOTE — Progress Notes (Signed)
 Diabetes Self-Management Education  Visit Type: Follow-up  Appt. Start Time: 1626 Appt. End Time: 1700  02/28/2024  Ms. Veronica Russell, identified by name and date of birth, is a 64 y.o. female with a diagnosis of Diabetes: type 2  .   ASSESSMENT  History includes: allergies, asthma, type 2 diabetes, GERD, glaucoma, HLD, HTN Labs noted: 11/06/23: A1c 7.0% Medications include: reviewed; farxiga , glipizide , metformin , semaglutide  Supplements: vitamin d3   Pt reports she got a cortisone shot a month ago to help with pain in her knee. Pt reports she is considering physical therapy.   Pt reports she is checking her blood sugar two times per day. Pt reports high blood sugar post prandial and before bed. Pt reports fasting has been around 140mg /dL and post prandial has been ~200mg /dL.   Pt reports she feels stress regarding her A1c and blood sugar. Pt states she is worried about having to take more medication, i.e Metformin  or Ozempic . Pt states she does not want to take more medication.   Pt states she is aware of her blood sugar dropping and will rectify it with mini sodas.   Pt reports she does not like the hospital cafeteria and the food is not desirable. Pt reports she will eat the balanced breaks snacks and string cheese.   Pt reports she is cooking at home around 3x per week. Pt states she makes enough for leftovers and will take it for lunch.   Pt states she did a 21 day fast for her church and gave up coca cola. Pt reports she is working to continue limiting soda intake and drinking less sugar sweetened beverages.   Assessment/Continuation of Previous Goals Established by Patient:    Goal 1: Cook dinner at home twice a week. Goal Met! Continue!  New Goals:   Goal 2: Talk to orthopedic doctor about physical therapy and how to properly increase exercise.   Goal 3: Pack lunch/leftovers twice a week for work.   Last menstrual period 08/31/2008. There is no height or weight on  file to calculate BMI.   Diabetes Self-Management Education - 02/28/24 1600       Visit Information   Visit Type Follow-up      Psychosocial Assessment   Patient Belief/Attitude about Diabetes Motivated to manage diabetes    What is the hardest part about your diabetes right now, causing you the most concern, or is the most worrisome to you about your diabetes?   Making healty food and beverage choices    Self-care barriers None    Self-management support Doctor's office;Church;Friends    Other persons present Patient    Patient Concerns Nutrition/Meal planning;Healthy Lifestyle    Special Needs None    Preferred Learning Style No preference indicated    Learning Readiness Ready      Pre-Education Assessment   Patient understands the diabetes disease and treatment process. Needs Review    Patient understands incorporating nutritional management into lifestyle. Needs Review    Patient undertands incorporating physical activity into lifestyle. Needs Review    Patient understands using medications safely. Needs Review    Patient understands monitoring blood glucose, interpreting and using results Needs Review    Patient understands prevention, detection, and treatment of acute complications. Needs Review    Patient understands prevention, detection, and treatment of chronic complications. Needs Review    Patient understands how to develop strategies to address psychosocial issues. Needs Review    Patient understands how to develop strategies to promote health/change  behavior. Needs Review      Complications   Last HgB A1C per patient/outside source 7 %    How often do you check your blood sugar? 1-2 times/day    Fasting Blood glucose range (mg/dL) 29-870;869-820    Postprandial Blood glucose range (mg/dL) 869-820;819-799      Dietary Intake   Breakfast protein shake OR oatmeal OR yogurt    Snack (morning) skip    Lunch chips and fruit    Snack (afternoon) cheese OR snickers     Dinner rotisserie chicken OR pasta OR takeout    Snack (evening) none    Beverage(s) >2 bottles water      Activity / Exercise   Activity / Exercise Type ADL's      Patient Education   Previous Diabetes Education Yes    Disease Pathophysiology Explored patient's options for treatment of their diabetes    Healthy Eating Role of diet in the treatment of diabetes and the relationship between the three main macronutrients and blood glucose level;Reviewed blood glucose goals for pre and post meals and how to evaluate the patients' food intake on their blood glucose level.;Information on hints to eating out and maintain blood glucose control.    Being Active Role of exercise on diabetes management, blood pressure control and cardiac health.;Identified with patient nutritional and/or medication changes necessary with exercise.;Helped patient identify appropriate exercises in relation to his/her diabetes, diabetes complications and other health issue.    Medications Reviewed patients medication for diabetes, action, purpose, timing of dose and side effects.    Monitoring Taught/discussed recording of test results and interpretation of SMBG.;Interpreting lab values - A1C, lipid, urine microalbumina.;Identified appropriate SMBG and/or A1C goals.    Chronic complications Relationship between chronic complications and blood glucose control;Identified and discussed with patient  current chronic complications    Diabetes Stress and Support Identified and addressed patients feelings and concerns about diabetes;Worked with patient to identify barriers to care and solutions;Role of stress on diabetes;Helped patient identify a support system for diabetes management;Brainstormed with patient on coping mechanisms for social situations, getting support from significant others, dealing with feelings about diabetes    Lifestyle and Health Coping Lifestyle issues that need to be addressed for better diabetes care;Helped  patient develop diabetes management plan for (enter comment)      Individualized Goals (developed by patient)   Nutrition General guidelines for healthy choices and portions discussed    Physical Activity Not Applicable    Medications take my medication as prescribed    Monitoring  Test my blood glucose as discussed;Test blood glucose pre and post meals as discussed    Problem Solving Eating Pattern;Addressing barriers to behavior change    Reducing Risk examine blood glucose patterns;treat hypoglycemia with 15 grams of carbs if blood glucose less than 70mg /dL    Health Coping Ask for help with psychological, social, or emotional issues      Patient Self-Evaluation of Goals - Patient rates self as meeting previously set goals (% of time)   Nutrition >75% (most of the time)    Physical Activity 25 - 50% (sometimes)    Medications >75% (most of the time)    Monitoring >75% (most of the time)    Problem Solving and behavior change strategies  50 - 75 % (half of the time)    Reducing Risk (treating acute and chronic complications) 50 - 75 % (half of the time)    Health Coping 50 - 75 % (half of the  time)      Post-Education Assessment   Patient understands the diabetes disease and treatment process. Comprehends key points    Patient understands incorporating nutritional management into lifestyle. Comprehends key points    Patient undertands incorporating physical activity into lifestyle. Comprehends key points    Patient understands using medications safely. Comphrehends key points    Patient understands monitoring blood glucose, interpreting and using results Comprehends key points    Patient understands prevention, detection, and treatment of acute complications. Comprehends key points    Patient understands prevention, detection, and treatment of chronic complications. Comprehends key points    Patient understands how to develop strategies to address psychosocial issues. Comprehends key  points    Patient understands how to develop strategies to promote health/change behavior. Comprehends key points      Outcomes   Expected Outcomes Demonstrated interest in learning. Expect positive outcomes    Future DMSE 2 months    Program Status Not Completed      Subsequent Visit   Since your last visit have you continued or begun to take your medications as prescribed? Yes    Since your last visit, are you checking your blood glucose at least once a day? Yes          Individualized Plan for Diabetes Self-Management Training:   Learning Objective:  Patient will have a greater understanding of diabetes self-management. Patient education plan is to attend individual and/or group sessions per assessed needs and concerns.   Plan:   Assessment/Continuation of Previous Goals Established by Patient:    Goal 1: Cook dinner at home twice a week. Goal Met! Continue!  New Goals:   Goal 2: Talk to orthopedic doctor about physical therapy and how to properly increase exercise.   Goal 3: Pack lunch/leftovers twice a week for work.   Expected Outcomes:  Demonstrated interest in learning. Expect positive outcomes  Education material provided: none at this follow up  If problems or questions, patient to contact team via:  Phone and Email  Future DSME appointment: 2 months

## 2024-03-06 ENCOUNTER — Other Ambulatory Visit (HOSPITAL_COMMUNITY): Payer: Self-pay

## 2024-03-06 MED ORDER — MELOXICAM 15 MG PO TABS
15.0000 mg | ORAL_TABLET | Freq: Every day | ORAL | 1 refills | Status: AC
  Filled 2024-03-06: qty 30, 30d supply, fill #0

## 2024-03-07 ENCOUNTER — Other Ambulatory Visit (HOSPITAL_COMMUNITY): Payer: Self-pay

## 2024-03-11 ENCOUNTER — Ambulatory Visit: Admitting: Internal Medicine

## 2024-04-23 ENCOUNTER — Encounter: Admitting: Dietician
